# Patient Record
Sex: Female | Born: 1940 | Race: Black or African American | Hispanic: No | Smoking: Never smoker
Health system: Southern US, Community
[De-identification: ages and names within clinical notes are randomized; demographics above are authoritative.]

## PROBLEM LIST (undated history)

## (undated) DIAGNOSIS — E785 Hyperlipidemia, unspecified: Secondary | ICD-10-CM

## (undated) DIAGNOSIS — G43909 Migraine, unspecified, not intractable, without status migrainosus: Secondary | ICD-10-CM

## (undated) DIAGNOSIS — K219 Gastro-esophageal reflux disease without esophagitis: Secondary | ICD-10-CM

## (undated) DIAGNOSIS — J449 Chronic obstructive pulmonary disease, unspecified: Secondary | ICD-10-CM

## (undated) DIAGNOSIS — R4182 Altered mental status, unspecified: Secondary | ICD-10-CM

## (undated) DIAGNOSIS — M1990 Unspecified osteoarthritis, unspecified site: Secondary | ICD-10-CM

## (undated) DIAGNOSIS — J189 Pneumonia, unspecified organism: Secondary | ICD-10-CM

## (undated) DIAGNOSIS — M545 Low back pain, unspecified: Secondary | ICD-10-CM

## (undated) DIAGNOSIS — J42 Unspecified chronic bronchitis: Secondary | ICD-10-CM

## (undated) DIAGNOSIS — R0602 Shortness of breath: Secondary | ICD-10-CM

## (undated) DIAGNOSIS — M109 Gout, unspecified: Secondary | ICD-10-CM

## (undated) DIAGNOSIS — J328 Other chronic sinusitis: Secondary | ICD-10-CM

## (undated) DIAGNOSIS — I10 Essential (primary) hypertension: Secondary | ICD-10-CM

## (undated) DIAGNOSIS — E119 Type 2 diabetes mellitus without complications: Secondary | ICD-10-CM

## (undated) DIAGNOSIS — G8929 Other chronic pain: Secondary | ICD-10-CM

## (undated) DIAGNOSIS — Z9289 Personal history of other medical treatment: Secondary | ICD-10-CM

## (undated) DIAGNOSIS — K759 Inflammatory liver disease, unspecified: Secondary | ICD-10-CM

## (undated) HISTORY — DX: Unspecified osteoarthritis, unspecified site: M19.90

## (undated) HISTORY — PX: BREAST BIOPSY: SHX20

## (undated) HISTORY — DX: Gout, unspecified: M10.9

## (undated) HISTORY — DX: Other chronic sinusitis: J32.8

## (undated) HISTORY — PX: ABDOMINAL HYSTERECTOMY: SHX81

## (undated) HISTORY — PX: TONSILLECTOMY: SUR1361

## (undated) HISTORY — PX: TUBAL LIGATION: SHX77

## (undated) HISTORY — DX: Hyperlipidemia, unspecified: E78.5

## (undated) HISTORY — DX: Inflammatory liver disease, unspecified: K75.9

## (undated) HISTORY — PX: APPENDECTOMY: SHX54

## (undated) HISTORY — PX: SHOULDER ARTHROSCOPY W/ ROTATOR CUFF REPAIR: SHX2400

## (undated) HISTORY — DX: Unspecified chronic bronchitis: J42

## (undated) SURGERY — CARPAL TUNNEL RELEASE
Anesthesia: General | Laterality: Right

---

## 1997-11-15 ENCOUNTER — Ambulatory Visit (HOSPITAL_COMMUNITY): Admission: RE | Admit: 1997-11-15 | Discharge: 1997-11-15 | Payer: Self-pay | Admitting: Internal Medicine

## 1998-02-23 ENCOUNTER — Ambulatory Visit: Admission: RE | Admit: 1998-02-23 | Discharge: 1998-02-23 | Payer: Self-pay | Admitting: Internal Medicine

## 1998-05-30 ENCOUNTER — Ambulatory Visit (HOSPITAL_COMMUNITY): Admission: RE | Admit: 1998-05-30 | Discharge: 1998-05-30 | Payer: Self-pay | Admitting: Internal Medicine

## 1998-06-01 ENCOUNTER — Ambulatory Visit (HOSPITAL_COMMUNITY): Admission: RE | Admit: 1998-06-01 | Discharge: 1998-06-01 | Payer: Self-pay | Admitting: Obstetrics and Gynecology

## 1998-06-01 ENCOUNTER — Encounter: Payer: Self-pay | Admitting: Obstetrics and Gynecology

## 1998-06-23 ENCOUNTER — Other Ambulatory Visit: Admission: RE | Admit: 1998-06-23 | Discharge: 1998-06-23 | Payer: Self-pay | Admitting: Obstetrics and Gynecology

## 1998-07-29 ENCOUNTER — Ambulatory Visit (HOSPITAL_COMMUNITY): Admission: RE | Admit: 1998-07-29 | Discharge: 1998-07-29 | Payer: Self-pay | Admitting: *Deleted

## 1999-02-27 ENCOUNTER — Other Ambulatory Visit: Admission: RE | Admit: 1999-02-27 | Discharge: 1999-02-27 | Payer: Self-pay | Admitting: *Deleted

## 1999-02-27 ENCOUNTER — Encounter (INDEPENDENT_AMBULATORY_CARE_PROVIDER_SITE_OTHER): Payer: Self-pay

## 2000-06-14 ENCOUNTER — Ambulatory Visit (HOSPITAL_COMMUNITY): Admission: RE | Admit: 2000-06-14 | Discharge: 2000-06-14 | Payer: Self-pay | Admitting: General Surgery

## 2000-06-14 ENCOUNTER — Encounter (HOSPITAL_BASED_OUTPATIENT_CLINIC_OR_DEPARTMENT_OTHER): Payer: Self-pay | Admitting: General Surgery

## 2000-08-21 ENCOUNTER — Encounter: Admission: RE | Admit: 2000-08-21 | Discharge: 2000-08-21 | Payer: Self-pay | Admitting: Internal Medicine

## 2000-08-21 ENCOUNTER — Encounter: Payer: Self-pay | Admitting: Internal Medicine

## 2000-09-23 ENCOUNTER — Encounter: Admission: RE | Admit: 2000-09-23 | Discharge: 2000-09-23 | Payer: Self-pay | Admitting: Infectious Diseases

## 2000-10-30 ENCOUNTER — Encounter: Admission: RE | Admit: 2000-10-30 | Discharge: 2000-10-30 | Payer: Self-pay | Admitting: Infectious Diseases

## 2000-12-25 ENCOUNTER — Encounter: Admission: RE | Admit: 2000-12-25 | Discharge: 2000-12-25 | Payer: Self-pay | Admitting: Infectious Diseases

## 2001-01-07 ENCOUNTER — Ambulatory Visit (HOSPITAL_COMMUNITY): Admission: RE | Admit: 2001-01-07 | Discharge: 2001-01-07 | Payer: Self-pay | Admitting: Obstetrics

## 2001-01-07 ENCOUNTER — Encounter: Payer: Self-pay | Admitting: Obstetrics

## 2001-07-21 ENCOUNTER — Ambulatory Visit (HOSPITAL_COMMUNITY): Admission: RE | Admit: 2001-07-21 | Discharge: 2001-07-21 | Payer: Self-pay | Admitting: Internal Medicine

## 2001-07-21 ENCOUNTER — Encounter: Payer: Self-pay | Admitting: Internal Medicine

## 2002-01-12 ENCOUNTER — Ambulatory Visit (HOSPITAL_COMMUNITY): Admission: RE | Admit: 2002-01-12 | Discharge: 2002-01-12 | Payer: Self-pay | Admitting: Obstetrics

## 2002-01-12 ENCOUNTER — Encounter: Payer: Self-pay | Admitting: Obstetrics

## 2002-03-25 ENCOUNTER — Inpatient Hospital Stay (HOSPITAL_COMMUNITY): Admission: AD | Admit: 2002-03-25 | Discharge: 2002-03-30 | Payer: Self-pay | Admitting: Internal Medicine

## 2002-03-25 ENCOUNTER — Encounter: Payer: Self-pay | Admitting: Internal Medicine

## 2002-06-11 ENCOUNTER — Ambulatory Visit (HOSPITAL_COMMUNITY): Admission: RE | Admit: 2002-06-11 | Discharge: 2002-06-11 | Payer: Self-pay | Admitting: Orthopedic Surgery

## 2002-12-23 ENCOUNTER — Encounter: Payer: Self-pay | Admitting: Otolaryngology

## 2002-12-23 ENCOUNTER — Encounter: Admission: RE | Admit: 2002-12-23 | Discharge: 2002-12-23 | Payer: Self-pay | Admitting: Otolaryngology

## 2003-01-25 ENCOUNTER — Encounter: Payer: Self-pay | Admitting: Internal Medicine

## 2003-01-25 ENCOUNTER — Ambulatory Visit (HOSPITAL_COMMUNITY): Admission: RE | Admit: 2003-01-25 | Discharge: 2003-01-25 | Payer: Self-pay | Admitting: Internal Medicine

## 2003-07-19 ENCOUNTER — Ambulatory Visit (HOSPITAL_COMMUNITY): Admission: RE | Admit: 2003-07-19 | Discharge: 2003-07-19 | Payer: Self-pay | Admitting: Gastroenterology

## 2004-01-27 ENCOUNTER — Ambulatory Visit (HOSPITAL_COMMUNITY): Admission: RE | Admit: 2004-01-27 | Discharge: 2004-01-27 | Payer: Self-pay | Admitting: Pulmonary Disease

## 2004-05-02 ENCOUNTER — Ambulatory Visit: Payer: Self-pay | Admitting: Internal Medicine

## 2004-06-14 ENCOUNTER — Ambulatory Visit: Payer: Self-pay | Admitting: Internal Medicine

## 2004-06-18 ENCOUNTER — Emergency Department (HOSPITAL_COMMUNITY): Admission: EM | Admit: 2004-06-18 | Discharge: 2004-06-18 | Payer: Self-pay | Admitting: *Deleted

## 2004-06-20 ENCOUNTER — Ambulatory Visit: Payer: Self-pay | Admitting: Infectious Diseases

## 2004-06-20 ENCOUNTER — Encounter (INDEPENDENT_AMBULATORY_CARE_PROVIDER_SITE_OTHER): Payer: Self-pay | Admitting: Specialist

## 2004-06-20 ENCOUNTER — Inpatient Hospital Stay (HOSPITAL_COMMUNITY): Admission: AD | Admit: 2004-06-20 | Discharge: 2004-06-28 | Payer: Self-pay | Admitting: Ophthalmology

## 2004-06-30 ENCOUNTER — Observation Stay (HOSPITAL_COMMUNITY): Admission: EM | Admit: 2004-06-30 | Discharge: 2004-06-30 | Payer: Self-pay | Admitting: Emergency Medicine

## 2004-07-17 ENCOUNTER — Ambulatory Visit (HOSPITAL_COMMUNITY): Admission: RE | Admit: 2004-07-17 | Discharge: 2004-07-17 | Payer: Self-pay | Admitting: Internal Medicine

## 2004-08-31 ENCOUNTER — Ambulatory Visit: Payer: Self-pay | Admitting: Internal Medicine

## 2004-09-09 ENCOUNTER — Ambulatory Visit (HOSPITAL_COMMUNITY): Admission: RE | Admit: 2004-09-09 | Discharge: 2004-09-09 | Payer: Self-pay | Admitting: Otolaryngology

## 2004-09-29 ENCOUNTER — Ambulatory Visit (HOSPITAL_COMMUNITY): Admission: RE | Admit: 2004-09-29 | Discharge: 2004-09-29 | Payer: Self-pay | Admitting: Orthopedic Surgery

## 2004-11-03 ENCOUNTER — Ambulatory Visit: Payer: Self-pay | Admitting: Internal Medicine

## 2004-12-05 ENCOUNTER — Ambulatory Visit: Payer: Self-pay | Admitting: Internal Medicine

## 2005-01-05 ENCOUNTER — Encounter: Admission: RE | Admit: 2005-01-05 | Discharge: 2005-01-05 | Payer: Self-pay | Admitting: Internal Medicine

## 2005-02-05 ENCOUNTER — Ambulatory Visit: Payer: Self-pay | Admitting: Internal Medicine

## 2005-02-27 ENCOUNTER — Ambulatory Visit: Payer: Self-pay | Admitting: Internal Medicine

## 2005-03-14 ENCOUNTER — Ambulatory Visit (HOSPITAL_COMMUNITY): Admission: RE | Admit: 2005-03-14 | Discharge: 2005-03-14 | Payer: Self-pay | Admitting: Internal Medicine

## 2005-05-14 ENCOUNTER — Ambulatory Visit: Payer: Self-pay | Admitting: Internal Medicine

## 2005-06-25 ENCOUNTER — Ambulatory Visit: Payer: Self-pay | Admitting: Internal Medicine

## 2005-07-04 ENCOUNTER — Encounter: Admission: RE | Admit: 2005-07-04 | Discharge: 2005-07-04 | Payer: Self-pay | Admitting: Internal Medicine

## 2005-07-13 ENCOUNTER — Encounter: Admission: RE | Admit: 2005-07-13 | Discharge: 2005-07-13 | Payer: Self-pay | Admitting: Internal Medicine

## 2005-09-03 ENCOUNTER — Ambulatory Visit: Payer: Self-pay | Admitting: Internal Medicine

## 2006-01-01 ENCOUNTER — Ambulatory Visit: Payer: Self-pay | Admitting: Internal Medicine

## 2006-02-19 ENCOUNTER — Encounter: Admission: RE | Admit: 2006-02-19 | Discharge: 2006-02-19 | Payer: Self-pay | Admitting: Orthopedic Surgery

## 2006-03-29 ENCOUNTER — Ambulatory Visit (HOSPITAL_COMMUNITY): Admission: RE | Admit: 2006-03-29 | Discharge: 2006-03-29 | Payer: Self-pay | Admitting: Internal Medicine

## 2006-03-29 ENCOUNTER — Encounter: Payer: Self-pay | Admitting: Vascular Surgery

## 2006-04-02 ENCOUNTER — Ambulatory Visit (HOSPITAL_COMMUNITY): Admission: RE | Admit: 2006-04-02 | Discharge: 2006-04-02 | Payer: Self-pay | Admitting: Internal Medicine

## 2006-04-29 ENCOUNTER — Ambulatory Visit (HOSPITAL_COMMUNITY): Admission: RE | Admit: 2006-04-29 | Discharge: 2006-04-29 | Payer: Self-pay | Admitting: Internal Medicine

## 2006-04-30 ENCOUNTER — Ambulatory Visit: Payer: Self-pay | Admitting: Internal Medicine

## 2006-08-01 ENCOUNTER — Ambulatory Visit (HOSPITAL_COMMUNITY): Admission: RE | Admit: 2006-08-01 | Discharge: 2006-08-01 | Payer: Self-pay | Admitting: Orthopedic Surgery

## 2006-08-09 ENCOUNTER — Encounter: Admission: RE | Admit: 2006-08-09 | Discharge: 2006-08-09 | Payer: Self-pay | Admitting: Orthopedic Surgery

## 2006-08-29 ENCOUNTER — Ambulatory Visit: Payer: Self-pay | Admitting: Internal Medicine

## 2006-10-18 ENCOUNTER — Emergency Department (HOSPITAL_COMMUNITY): Admission: EM | Admit: 2006-10-18 | Discharge: 2006-10-18 | Payer: Self-pay | Admitting: Family Medicine

## 2006-10-19 ENCOUNTER — Emergency Department (HOSPITAL_COMMUNITY): Admission: EM | Admit: 2006-10-19 | Discharge: 2006-10-19 | Payer: Self-pay | Admitting: Family Medicine

## 2007-03-06 ENCOUNTER — Ambulatory Visit: Payer: Self-pay | Admitting: Internal Medicine

## 2007-03-07 ENCOUNTER — Encounter: Payer: Self-pay | Admitting: Internal Medicine

## 2007-03-07 DIAGNOSIS — M1990 Unspecified osteoarthritis, unspecified site: Secondary | ICD-10-CM | POA: Insufficient documentation

## 2007-03-07 DIAGNOSIS — J328 Other chronic sinusitis: Secondary | ICD-10-CM

## 2007-03-07 DIAGNOSIS — K759 Inflammatory liver disease, unspecified: Secondary | ICD-10-CM | POA: Insufficient documentation

## 2007-03-07 DIAGNOSIS — J209 Acute bronchitis, unspecified: Secondary | ICD-10-CM | POA: Insufficient documentation

## 2007-03-07 DIAGNOSIS — I10 Essential (primary) hypertension: Secondary | ICD-10-CM

## 2007-03-07 DIAGNOSIS — J440 Chronic obstructive pulmonary disease with acute lower respiratory infection: Secondary | ICD-10-CM

## 2007-03-07 DIAGNOSIS — M109 Gout, unspecified: Secondary | ICD-10-CM

## 2007-03-07 DIAGNOSIS — L02619 Cutaneous abscess of unspecified foot: Secondary | ICD-10-CM | POA: Insufficient documentation

## 2007-03-07 DIAGNOSIS — L03039 Cellulitis of unspecified toe: Secondary | ICD-10-CM

## 2007-04-04 ENCOUNTER — Ambulatory Visit (HOSPITAL_COMMUNITY): Admission: RE | Admit: 2007-04-04 | Discharge: 2007-04-04 | Payer: Self-pay | Admitting: Internal Medicine

## 2007-09-05 ENCOUNTER — Encounter: Payer: Self-pay | Admitting: Internal Medicine

## 2008-01-30 ENCOUNTER — Ambulatory Visit: Payer: Self-pay | Admitting: Internal Medicine

## 2008-04-22 ENCOUNTER — Ambulatory Visit (HOSPITAL_COMMUNITY): Admission: RE | Admit: 2008-04-22 | Discharge: 2008-04-22 | Payer: Self-pay | Admitting: Internal Medicine

## 2008-04-28 ENCOUNTER — Encounter: Admission: RE | Admit: 2008-04-28 | Discharge: 2008-04-28 | Payer: Self-pay | Admitting: Internal Medicine

## 2008-07-30 ENCOUNTER — Ambulatory Visit: Payer: Self-pay | Admitting: Internal Medicine

## 2008-09-29 ENCOUNTER — Encounter: Admission: RE | Admit: 2008-09-29 | Discharge: 2008-09-29 | Payer: Self-pay | Admitting: Orthopedic Surgery

## 2008-10-05 ENCOUNTER — Encounter: Admission: RE | Admit: 2008-10-05 | Discharge: 2008-10-05 | Payer: Self-pay | Admitting: Internal Medicine

## 2008-12-07 ENCOUNTER — Encounter: Admission: RE | Admit: 2008-12-07 | Discharge: 2008-12-07 | Payer: Self-pay | Admitting: Orthopedic Surgery

## 2008-12-09 ENCOUNTER — Ambulatory Visit (HOSPITAL_COMMUNITY): Admission: RE | Admit: 2008-12-09 | Discharge: 2008-12-11 | Payer: Self-pay | Admitting: Orthopedic Surgery

## 2009-01-13 ENCOUNTER — Encounter: Admission: RE | Admit: 2009-01-13 | Discharge: 2009-04-13 | Payer: Self-pay | Admitting: Orthopedic Surgery

## 2009-01-31 ENCOUNTER — Ambulatory Visit: Payer: Self-pay | Admitting: Internal Medicine

## 2009-02-09 ENCOUNTER — Telehealth: Payer: Self-pay | Admitting: Internal Medicine

## 2009-04-18 ENCOUNTER — Encounter: Admission: RE | Admit: 2009-04-18 | Discharge: 2009-06-13 | Payer: Self-pay | Admitting: Orthopedic Surgery

## 2009-04-29 ENCOUNTER — Ambulatory Visit (HOSPITAL_COMMUNITY): Admission: RE | Admit: 2009-04-29 | Discharge: 2009-04-29 | Payer: Self-pay | Admitting: Internal Medicine

## 2009-08-01 ENCOUNTER — Ambulatory Visit: Payer: Self-pay | Admitting: Internal Medicine

## 2009-09-20 ENCOUNTER — Telehealth: Payer: Self-pay | Admitting: Internal Medicine

## 2010-01-30 ENCOUNTER — Ambulatory Visit: Payer: Self-pay | Admitting: Internal Medicine

## 2010-05-07 ENCOUNTER — Encounter: Payer: Self-pay | Admitting: Internal Medicine

## 2010-05-11 ENCOUNTER — Other Ambulatory Visit (HOSPITAL_COMMUNITY): Payer: Self-pay | Admitting: Internal Medicine

## 2010-05-11 DIAGNOSIS — Z139 Encounter for screening, unspecified: Secondary | ICD-10-CM

## 2010-05-18 NOTE — Assessment & Plan Note (Signed)
Summary: rov 6 months///kp   Primary Provider/Referring Provider:  Dr. Renae Gloss  CC:  Follow up visit.  History of Present Illness: 70yo woman with  Chronic bronchitis/bronchiectasis, and a history of acute sinusitis.   01/30/08- Had Flu vax. Had pneumovax in 2003, 2005.Now retired from OR scrub tech role- she used to blame col air there for making her sick. Not as bad since retiring, some chest tightness now. Does cough, usually clear mucus. Denies chest pain or palpitation, fever, blood, edema, adenpopathy, headache. Pneumovax x 2, 2003, 2005. Had flu vax. Asks for third Pvax- discussed.  08/03/2008 Chronic bronchitis/ bronchiectasis, hx acute sinusitis Had good winter wihout significant viral illness. Did get flu shots. 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 Less frequently ill since she retired. she had not been coughing but in the last 2 days has developed some chest congestion, nonproductive cough, without fever , nodes, blood or sinus pain. She has mucinex to use as needed. 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  January 31, 2009- Chronic bronchitis/ bronchiectasis, hx sinusitis Had uncomplicated shoulder surgery.Nasal congestion with cooler Fall weather. Allegra-D is sufficient. Does her nebs 2-3 x daily. Mostly nasal drainage grey.Denies headache, earache or fever. Flu vax talk. 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  August 01, 2009- Chronic bronchitis/ bronchiectasis, hx sinusitis Two weeks ago she had sustained cough, bronchitis. Today she says she feels well. Not much trouble through the winter- some hoarseness and chest congestion. Infrequent cough now. Denies sinus pain/ congestion. Rarely needs rescue inhaler.     Current Medications (verified): 1)  Verapamil Hcl Cr 240 Mg Xr24h-Cap (Verapamil Hcl) .... Take 1  By Mouth Once Daily 2)  Triamterene-Hctz 37.5-25 Mg Tabs (Triamterene-Hctz) .... Take 1 By Mouth Once Daily 3)  Allegra-D 12 Hour 60-120 Mg Xr12h-Tab (Fexofenadine-Pseudoephedrine) .... Take 1 Tablet By Mouth Two Times A Day 4)  Albuterol Sulfate (2.5 Mg/57ml) 0.083% Nebu (Albuterol Sulfate) .... Use As Directed 5)  Etodolac 400 Mg Tabs (Etodolac) .... Take 1 By Mouth Once Daily 6)  Simvastatin 20 Mg Tabs (Simvastatin) .... Take 1 By Mouth Once Daily 7)  Potassium Chloride 20 Meq Pack (Potassium Chloride) .... Take 1 By Mouth Once Daily 8)  Pulmicort 0.25 Mg/73ml Susp (Budesonide) 9)  Multivitamins  Tabs (Multiple Vitamin) .... Take 1 By Mouth Once Daily  Allergies (verified): 1)  ! Pcn 2)  ! Levaquin 3)  ! 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) BRONCHITIS, CHRONIC (ICD-491.9)  Past Surgical History: Last updated: 01/31/2009 Hysterectomy Tubal ligation Tonsillectomy left shoulder- 2010  Family History: Last updated: 08-03-2008 Both parents deceased from MI Sibling 1 has DM and heart disease sibling 2 had Hilda Blades Disease/ ALS, hypertension siblings 3,4,5 healthy  Social History: Last updated: 02/17/2008 Never smoker Married No children positive for second-hand smoke exposure states exercises 4-5 times a week drinks 1-2 cups caffeine a day  Review of Systems      See HPI  The patient denies anorexia, fever, weight loss, weight gain, vision loss, decreased hearing, hoarseness, chest pain, syncope, dyspnea on exertion, peripheral edema, prolonged cough, headaches, hemoptysis, abdominal pain, and severe indigestion/heartburn.    Vital Signs:  Patient profile:   70 year old female Height:  62.5 inches Weight:      143.38 pounds BMI:     25.90 O2 Sat:      98 % on Room air Pulse rate:   69 /  minute BP sitting:   140 / 82  (left arm) Cuff size:   regular  Vitals Entered By: Reynaldo Minium CMA (August 01, 2009 11:24 AM)  O2 Flow:  Room air  Physical Exam  Additional Exam:  General: A/Ox3; pleasant and cooperative, NAD, SKIN: no rash, lesions NODES: no lymphadenopathy HEENT: [place]/AT, EOM- WNL, Conjuctivae- clear, PERRLA, TM-WNL, Nose- clear, Throat- clear and wnl, Mallampati II, no drainage NECK: Supple w/ fair ROM, JVD- none, normal carotid impulses w/o bruits Thyroid-  CHEST:Clear to P&A, unlabored and talkative,  HEART: RRR NWG:NFAO, nl pulses, no edema  NEURO: Grossly intact to observation      Impression & Recommendations:  Problem # 1:  BRONCHITIS, CHRONIC (ICD-491.9)  Doing very well now. It sounds as if she may have had a cold 1-2 weeks ago, but it's a little hard to be sure. She will remain somewhat labile and we want to make sure she has meds available to her. 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 We discussed management should she have a flare this summer.  Other Orders: Est. Patient Level III (13086)  Patient Instructions: 1)  Please schedule a follow-up appointment in 6 months. 2)  Call if you need help.

## 2010-05-18 NOTE — Progress Notes (Signed)
Summary: waiting on refill  Phone Note Call from Patient Call back at Home Phone [phone]   Caller: Patient Call For: young Summary of Call: pt waiting for [facility]ine[facility] [address] (pt is out of this).  Initial call taken by: Tivis Ringer, CNA,  September 20, 2009 3:20 PM  Follow-up for Phone Call        refill sent. pt aware.Carron Curie CMA  September 20, 2009 3:23 PM     Prescriptions: ALLEGRA-D 12 HOUR 60-120 MG XR12H-TAB (FEXOFENADINE-PSEUDOEPHEDRINE) Take 1 tablet by mouth two times a day  #60 Tablet x 5   Entered by:   Carron Curie CMA   Authorized by:   Waymon Budge MD   Signed by:   Carron Curie CMA on 09/20/2009   Method used:   [facility] to        [facility]  [address] [phone]* (retail)       [address]       [place], [place]  [id number]       Ph: [phone] or [phone]       Fax: [phone]   RxID:   [id number]

## 2010-05-18 NOTE — Assessment & Plan Note (Signed)
Summary: 6 months/apc   Primary Provider/Referring Provider:  Dr. Renae Gloss  CC:  6 month follow  up  visit-SOB at times(weather changes).  History of Present Illness:  January 31, 2009- Chronic bronchitis/ bronchiectasis, hx sinusitis Had uncomplicated shoulder surgery.Nasal congestion with cooler Fall weather. Allegra-D is sufficient. Does her nebs 2-3 x daily. Mostly nasal drainage grey.Denies headache, earache or fever. Flu vax talk. 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  August 01, 2009- Chronic bronchitis/ bronchiectasis, hx sinusitis Two weeks ago she had sustained cough, bronchitis. Today she says she feels well. Not much trouble through the winter- some hoarseness and chest congestion. Infrequent cough now. Denies sinus pain/ congestion. Rarely needs rescue inhaler.   January 30, 2010- Chronic bronchitis/ bronchiectasis, hx sinusitis Nurse CC  6 month follow  up  visit-SOB at times(weather changes) Has done well through summer and fall up to now. Most days will have some cough. Associates weather change with a little SOB. Unusual to have breathing wake her otherwise,  and her nebulizer is suficient if needed.  She shows me two old rescue inhalers, still almost full, from 2006. Her main c/o is low back pain, sometimes aggravated if she coughs hard. Not much sputum- mostly in the morning.  Uses neb two times a day daily. Exercises and steam room at [facility]  Preventive Screening-Counseling & Management  Alcohol-Tobacco     Smoking Status: never  Current Medications (verified): 1)  Verapamil Hcl Cr 240 Mg Xr24h-Cap (Verapamil Hcl) .... Take 1 By Mouth Once Daily 2)  Triamterene-Hctz 37.5-25 Mg Tabs (Triamterene-Hctz) .... Take 1 By Mouth Once Daily 3)  Allegra-D 12 Hour 60-120 Mg Xr12h-Tab (Fexofenadine-Pseudoephedrine) .... Take 1 Tablet By Mouth Two Times A Day 4)  Albuterol Sulfate (2.5 Mg/46ml) 0.083% Nebu (Albuterol  Sulfate) .... Use As Directed 5)  Etodolac 400 Mg Tabs (Etodolac) .... Take 1 By Mouth Once Daily 6)  Simvastatin 20 Mg Tabs (Simvastatin) .... Take 1 By Mouth Once Daily 7)  Potassium Chloride 20 Meq Pack (Potassium Chloride) .... Take 1 By Mouth Once Daily 8)  Pulmicort 0.25 Mg/47ml Susp (Budesonide) 9)  Multivitamins  Tabs (Multiple Vitamin) .... Take 1 By Mouth Once Daily 10)  Tylenol Pm Extra Strength 500-25 Mg Tabs (Diphenhydramine-Apap (Sleep)) .... Take As Directed Per Bottle As Needed 11)  Hydrocodone-Acetaminophen 5-500 Mg Tabs (Hydrocodone-Acetaminophen) .... Take 1 By Mouth Every 4 Hours As Needed Not To Exceed 3 Daily  Allergies (verified): 1)  ! Pcn 2)  ! Levaquin 3)  ! 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) BRONCHITIS, CHRONIC (ICD-491.9)  Past Surgical History: Last updated: 01/31/2009 Hysterectomy Tubal ligation Tonsillectomy left shoulder- 2010  Family History: Last updated: 08/01/2008 Both parents deceased from MI Sibling 1 has DM and heart disease sibling 2 had Hilda Blades Disease/ ALS, hypertension siblings 3,4,5 healthy  Social History: Last updated: 02/17/2008 Never smoker Married No children positive for second-hand smoke exposure states exercises 4-5 times a week drinks 1-2 cups caffeine a day  Risk Factors: Smoking Status: never (01/30/2010)  Social History: Smoking Status:  never  Review of Systems      See HPI       The patient complains of shortness of breath with activity, productive cough, and non-productive cough.  The patient denies shortness of breath at rest, coughing up blood, chest pain, irregular heartbeats, acid  heartburn, indigestion, loss of appetite, weight change, abdominal pain, difficulty swallowing, sore throat, tooth/dental problems, headaches, nasal  congestion/difficulty breathing through nose, and sneezing.    Vital Signs:  Patient profile:   70 year old female Height:      62.5 inches Weight:      141.13 pounds BMI:     25.49 O2 Sat:      100 % on Room air Pulse rate:   81 / minute BP sitting:   124 / 68  (left arm) Cuff size:   regular  Vitals Entered By: Reynaldo Minium CMA (January 30, 2010 1:42 PM)  O2 Flow:  Room air CC: 6 month follow  up  visit-SOB at times(weather changes)   Physical Exam  Additional Exam:  General: A/Ox3; pleasant and cooperative, NAD, SKIN: no rash, lesions NODES: small node under left mandible- had broken a tooth. HEENT: [place]/AT, EOM- WNL, Conjuctivae- clear, PERRLA, TM-WNL, Nose- clear, Throat- clear and wnl, Mallampati II, no drainage NECK: Supple w/ fair ROM, JVD- none, normal carotid impulses w/o bruits Thyroid-  CHEST:Clear to P&A, unlabored and talkative, Coarse large airway rattle.  HEART: RRR TFT:DDUK, nl pulses, no edema  NEURO: Grossly intact to observation      Impression & Recommendations:  Problem # 1:  BRONCHITIS, CHRONIC (ICD-491.9)  Still significant chronic proximal bronchits. 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 We will refill her neb meds and a rescue inhaler. Has had flu vax. Is not using pulmicort- too expensive.  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 fortunately this has not recurred. We have again considered the basis for improvement since she retired and left the "clean" environment of the operating rooms at [employer].  She is no longer chilled as she frequently was at work- that may be significant.   Medications Added to Medication List This Visit: 1)  Tylenol Pm Extra Strength 500-25 Mg Tabs (Diphenhydramine-apap (sleep)) .... Take as  directed per bottle as needed 2)  Hydrocodone-acetaminophen 5-500 Mg Tabs (Hydrocodone-acetaminophen) .... Take 1 by mouth every 4 hours as needed not to exceed 3 daily 3)  Proair Hfa 108 (90 Base) Mcg/act Aers (Albuterol sulfate) .... 2 puffs four times a day as needed - albuterol rescue inhaler  Complete Medication List: 1)  Verapamil Hcl Cr 240 Mg Xr24h-cap (Verapamil hcl) .... Take 1 by mouth once daily 2)  Triamterene-hctz 37.5-25 Mg Tabs (Triamterene-hctz) .... Take 1 by mouth once daily 3)  Allegra-d 12 Hour 60-120 Mg Xr12h-tab (Fexofenadine-pseudoephedrine) .... Take 1 tablet by mouth two times a day 4)  Albuterol Sulfate (2.5 Mg/41ml) 0.083% Nebu (Albuterol sulfate) .... Use as directed 5)  Etodolac 400 Mg Tabs (Etodolac) .... Take 1 by mouth once daily 6)  Simvastatin 20 Mg Tabs (Simvastatin) .... Take 1 by mouth once daily 7)  Potassium Chloride 20 Meq Pack (Potassium chloride) .... Take 1 by mouth once daily 8)  Multivitamins Tabs (Multiple vitamin) .... Take 1 by mouth once daily 9)  Tylenol Pm Extra Strength 500-25 Mg Tabs (Diphenhydramine-apap (sleep)) .... Take as directed per bottle as needed 10)  Hydrocodone-acetaminophen 5-500 Mg Tabs (Hydrocodone-acetaminophen) .... Take 1 by mouth every 4 hours as needed not to exceed 3 daily 11)  Proair Hfa 108 (90 Base) Mcg/act Aers (Albuterol sulfate) .... 2 puffs four times a day as  needed - albuterol rescue inhaler  Other Orders: Est. Patient Level III (16109)  Patient Instructions: 1)  Please schedule a follow-up appointment in 6 months. 2)  Try using mucinex for a few days along with the Flutter to clear your airways out once in a while.  3)  Meds refilled, including a modern replacement for your old albuterol inhalers.  Prescriptions: PROAIR HFA 108 (90 BASE) MCG/ACT AERS (ALBUTEROL SULFATE) 2 puffs four times a day as needed - albuterol rescue inhaler  #1 x prn   Entered and Authorized by:   Waymon Budge MD   Signed by:    Waymon Budge MD on 01/30/2010   Method used:   Print then Give to Patient   RxID:   [id number] ALBUTEROL SULFATE (2.5 MG/3ML) 0.083% NEBU (ALBUTEROL SULFATE) Use as directed  #175 x 3   Entered and Authorized by:   Waymon Budge MD   Signed by:   Waymon Budge MD on 01/30/2010   Method used:   Print then Give to Patient   RxID:   [id number]

## 2010-05-22 ENCOUNTER — Ambulatory Visit (HOSPITAL_COMMUNITY)
Admission: RE | Admit: 2010-05-22 | Discharge: 2010-05-22 | Disposition: A | Discharge status: Home / Self Care | Payer: Medicare Other | Source: Ambulatory Visit | Attending: Internal Medicine | Admitting: Internal Medicine

## 2010-05-22 DIAGNOSIS — Z1231 Encounter for screening mammogram for malignant neoplasm of breast: Secondary | ICD-10-CM | POA: Insufficient documentation

## 2010-05-22 DIAGNOSIS — Z139 Encounter for screening, unspecified: Secondary | ICD-10-CM

## 2010-05-24 ENCOUNTER — Other Ambulatory Visit: Payer: Self-pay | Admitting: Internal Medicine

## 2010-05-24 DIAGNOSIS — R928 Other abnormal and inconclusive findings on diagnostic imaging of breast: Secondary | ICD-10-CM

## 2010-05-30 ENCOUNTER — Ambulatory Visit
Admission: RE | Admit: 2010-05-30 | Discharge: 2010-05-30 | Disposition: A | Discharge status: Home / Self Care | Payer: Medicare Other | Source: Ambulatory Visit | Attending: Internal Medicine | Admitting: Internal Medicine

## 2010-05-30 DIAGNOSIS — R928 Other abnormal and inconclusive findings on diagnostic imaging of breast: Secondary | ICD-10-CM

## 2010-07-22 LAB — COMPREHENSIVE METABOLIC PANEL
ALT: 14 U/L (ref 0–35)
AST: 22 U/L (ref 0–37)
Alkaline Phosphatase: 149 U/L — ABNORMAL HIGH (ref 39–117)
CO2: 29 mEq/L (ref 19–32)
Calcium: 9.9 mg/dL (ref 8.4–10.5)
GFR calc Af Amer: 60 mL/min — ABNORMAL LOW (ref >60)
Glucose, Bld: 119 mg/dL — ABNORMAL HIGH (ref 70–99)
Potassium: 3.2 mEq/L — ABNORMAL LOW (ref 3.5–5.1)
Sodium: 144 mEq/L (ref 135–145)
Total Protein: 7.8 g/dL (ref 6.0–8.3)

## 2010-07-22 LAB — CBC
Hemoglobin: 12 g/dL (ref 12.0–15.0)
RBC: 3.67 MIL/uL — ABNORMAL LOW (ref 3.87–5.11)
RDW: 13.4 % (ref 11.5–15.5)

## 2010-07-22 LAB — URINE MICROSCOPIC-ADD ON

## 2010-07-22 LAB — URINALYSIS, ROUTINE W REFLEX MICROSCOPIC
Glucose, UA: NEGATIVE mg/dL
Hgb urine dipstick: NEGATIVE
Protein, ur: NEGATIVE mg/dL
pH: 6.5 (ref 5.0–8.0)

## 2010-07-22 LAB — PROTIME-INR: Prothrombin Time: 13.9 seconds (ref 11.6–15.2)

## 2010-07-28 ENCOUNTER — Encounter: Payer: Self-pay | Admitting: Internal Medicine

## 2010-07-31 ENCOUNTER — Encounter: Payer: Self-pay | Admitting: Internal Medicine

## 2010-07-31 ENCOUNTER — Ambulatory Visit (INDEPENDENT_AMBULATORY_CARE_PROVIDER_SITE_OTHER)
Admission: RE | Admit: 2010-07-31 | Discharge: 2010-07-31 | Disposition: A | Discharge status: Home / Self Care | Payer: Medicare Other | Source: Ambulatory Visit | Attending: Internal Medicine | Admitting: Internal Medicine

## 2010-07-31 ENCOUNTER — Ambulatory Visit (INDEPENDENT_AMBULATORY_CARE_PROVIDER_SITE_OTHER): Payer: Medicare Other | Admitting: Internal Medicine

## 2010-07-31 VITALS — BP 120/62 | HR 79 | Ht 62.5 in | Wt 139.4 lb

## 2010-07-31 DIAGNOSIS — J42 Unspecified chronic bronchitis: Secondary | ICD-10-CM

## 2010-07-31 DIAGNOSIS — J328 Other chronic sinusitis: Secondary | ICD-10-CM

## 2010-07-31 MED ORDER — ALBUTEROL SULFATE (2.5 MG/3ML) 0.083% IN NEBU: 2.5000 mg | INHALATION_SOLUTION | Freq: Four times a day (QID) | RESPIRATORY_TRACT | Status: DC | PRN

## 2010-07-31 MED ORDER — ROFLUMILAST 500 MCG PO TABS: 500.0000 ug | ORAL_TABLET | Freq: Every day | ORAL | Status: DC

## 2010-07-31 NOTE — Progress Notes (Signed)
  Subjective:    Patient ID: Sheila Cisneros, female    DOB: 25-Oct-1940, 70 y.o.   MRN: [id number]  HPI 70 yoF followed for chronic bronchitis/ bronchiectasis and hx rhinosinusitis. Never smoker. Last here- January 30, 2010. She remained congested throough the winter, but she put up with it. No events needing medical visits. Pollen now makes cough somewhat more constant. Rarely productive. Denies fever or night sweats. Nebulizer still helps, used about twice daily. Denies  Chest pain or headache.    Review of Systems  Constitutional: Negative for fever, chills, diaphoresis, activity change and fatigue.  HENT: Positive for congestion, rhinorrhea, sneezing and postnasal drip. Negative for nosebleeds and ear discharge.   Eyes: Positive for itching.  Respiratory: Negative for cough, choking, chest tightness, shortness of breath, wheezing and stridor.    See HPI     Objective:   Physical Exam .General- Alert, Oriented, Affect-appropriate, Distress- none acute  Skin- rash-none, lesions- none, excoriation- none  Lymphadenopathy- none  Head- atraumatic  Eyes- Gross vision intact, PERRLA, conjunctivae clear secretions  Ears- Hearing  Normal- canals, Tm L ,   R ,  Nose- Clear,  No-Septal dev, mucus, polyps, erosion, perforation   Throat- Mallampati II , mucosa clear , drainage- none, tonsils- atrophic  Neck- flexible , trachea midline, no stridor , thyroid nl, carotid no bruit  Chest - symmetrical excursion , unlabored     Heart/CV- RRR , no murmur , no gallop  , no rub, nl s1 s2                     - JVD- none , edema- none, stasis changes- none, varices- none     Lung-  Deep non productive cough/ rattle., dullness-none, rub- none     Chest wall-   Abd- tender-no, distended-no, bowel sounds-present, HSM- no  Br/ Gen/ Rectal- Not done, not indicated  Extrem- cyanosis- none, clubbing, none, atrophy- none, strength- nl  Neuro- grossly intact to observation          Assessment & Plan:

## 2010-07-31 NOTE — Assessment & Plan Note (Signed)
Chronic bronchitis/ bronchiectasis. We will update CXR/  We discused and will try Daliresp as an antiinflammatory.

## 2010-07-31 NOTE — Patient Instructions (Addendum)
Orders- CXR- dx Chronic bronchitis  Samples/ script  Daliresp  1 daily  After meal.

## 2010-08-05 ENCOUNTER — Encounter: Payer: Self-pay | Admitting: Internal Medicine

## 2010-08-05 NOTE — Assessment & Plan Note (Signed)
She has been hospitalized before with severe periorbital and facial swelling due to acute sinusitis, but has had no recurrence on that scale. We manage symptomatically.

## 2010-08-11 NOTE — Progress Notes (Signed)
Quick Note:  Left message for patient to call back. ______ 

## 2010-08-16 ENCOUNTER — Telehealth: Payer: Self-pay | Admitting: Internal Medicine

## 2010-08-16 NOTE — Telephone Encounter (Signed)
lmomtcb x1 

## 2010-08-17 NOTE — Telephone Encounter (Signed)
lmomtcb x 2  

## 2010-08-18 NOTE — Telephone Encounter (Signed)
LMTCB x 3- she needs results of cxr and CDY wants to order CT Chest so will need BMET if she agrees. Will wait and see if she calls back and if not will send letter.

## 2010-08-21 ENCOUNTER — Encounter: Payer: Self-pay | Admitting: *Deleted

## 2010-08-21 NOTE — Telephone Encounter (Signed)
Still unable to get ahold of pt.  Therefore will send pt a letter to call our office to discuss test results.

## 2010-08-25 ENCOUNTER — Other Ambulatory Visit: Payer: Self-pay | Admitting: Internal Medicine

## 2010-08-25 ENCOUNTER — Other Ambulatory Visit (INDEPENDENT_AMBULATORY_CARE_PROVIDER_SITE_OTHER): Payer: Medicare Other

## 2010-08-25 DIAGNOSIS — J479 Bronchiectasis, uncomplicated: Secondary | ICD-10-CM

## 2010-08-25 LAB — BASIC METABOLIC PANEL
Calcium: 9.4 mg/dL (ref 8.4–10.5)
Creatinine, Ser: 0.9 mg/dL (ref 0.4–1.2)
GFR: 78.76 mL/min (ref >60.00)
Sodium: 139 mEq/L (ref 135–145)

## 2010-08-28 ENCOUNTER — Ambulatory Visit (INDEPENDENT_AMBULATORY_CARE_PROVIDER_SITE_OTHER)
Admission: RE | Admit: 2010-08-28 | Discharge: 2010-08-28 | Disposition: A | Discharge status: Home / Self Care | Payer: Medicare Other | Source: Ambulatory Visit | Attending: Internal Medicine | Admitting: Internal Medicine

## 2010-08-28 DIAGNOSIS — J479 Bronchiectasis, uncomplicated: Secondary | ICD-10-CM

## 2010-08-28 HISTORY — DX: Essential (primary) hypertension: I10

## 2010-08-28 MED ORDER — IOHEXOL 300 MG/ML  SOLN
80.0000 mL | Freq: Once | INTRAMUSCULAR | Status: AC | PRN
  Administered 2010-08-28: 80 mL via INTRAVENOUS

## 2010-08-29 NOTE — Assessment & Plan Note (Signed)
[facility]                             PULMONARY OFFICE NOTE   FLETCHER, RATHBUN                        MRN:          [id number]  DATE:03/06/2007                            DOB:          [birth year]    PULMONARY FOLLOWUP   PROBLEM LIST:  1. Chronic bronchitis/bronchiectasis.  2. Chronic rhinosinusitis.  3. Hypertension.  4. History of hepatitis.  5. Gout.   HISTORY:  She feels better today than she often does.  We discussed her  experience with the workplace because she is hoping to retire this  winter.  She says that a closed in feeling being indoors and the pace  of work at the hospital are the problems more than any sort of air  quality issue.  She is better at home because she can pace herself and  sit down when she wants.  There has been no recent exacerbation.   MEDICATIONS:  1. Verapamil.  2. Mucinex.  3. Benicar.  4. Triamterine/hydrochlorothiazide.  5. Allegra D 60 mg.  6. Home nebulizer with DuoNeb q.i.d.  7. She has Pulmicort 0.5 mg b.i.d.  8. Nasonex.  9. Albuterol rescue inhaler.   Drug intolerant PENICILLIN with rash, LEVAQUIN with itching (can  tolerate Cipro).  CODEINE causing angioedema.  TROVAN.   OBJECTIVE:  Weight 152 pounds, BP 160/104, pulse regular 86, room air  saturation 100%.  There are a few scattered rhonchi.  HEART:  Sounds are regular.  Congested cough.  No evidence of sinus  drainage or periorbital edema.   IMPRESSION:  Chronic bronchitis/bronchiectasis, and a history of acute  sinusitis.   PLAN:  Schedule return in 6 months when she will have been out of work  for several months.     Clinton D. Maple Hudson, MD, Tonny Bollman, FACP  Electronically Signed    CDY/MedQ  DD: 03/06/2007  DT: 03/07/2007  Job #: [id number]   cc:   Merlene Laughter. Renae Gloss, M.D.

## 2010-08-29 NOTE — Assessment & Plan Note (Signed)
 [facility]ALTH[facility]                             PULMONARY OFFICE NOTE   Sheila Cisneros, Sheila Cisneros                        MRN:          [id number]  DATE:08/29/2006                            DOB:          [birth year]    PROBLEMS:  1. Chronic bronchitis/bronchiectasis.  2. Chronic rhinosinusitis.  3. Hypertension.  4. History of hepatitis.  5. Gout.   HISTORY:  She has been doing pretty well since this winter when she had  pneumonia. There is some shortness of breath noted if she hurries. She  makes a point of dressing warmly. Most days she has a little productive  cough with phlegm. 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 We reviewed history of positive allergy skin testing,  she declined vaccine. She quite Xolair injections.   MEDICATIONS:  1. Verapamil.  2. Mucinex.  3. Benicar.  4. Triamterene/hydrochlorothiazide.  5. Allegra b.i.d. p.r.n.  6. Home nebulizer with Pulmicort 0.5 mg b.i.d.   She is not using albuterol by nebulizer currently. She does have a  rescue albuterol inhaler, which she has not needed in a while now. Drug  intolerant PENICILLIN with rash, LEVAQUIN with itching (CIPRO okay),  TROVAN, CODEINE with angioedema.   OBJECTIVE:  Weight 153 pounds, blood pressure 132/80, pulse 86, room air  saturation 97. There is mild congested rattle in both lung bases. Work  of breathing is not increased. There is no periorbital edema or  significant nasal congestion. I do not find adenopathy or edema. Heart  sounds are regular.   IMPRESSION:  Chronic bronchitis with bronchiectasis. Chronic rhinitis  with an allergic component. She does not currently have an acute  bronchitis or sinusitis.   PLAN:  No changes. Schedule return 6 months, earlier p.r.n.     Clinton D. Maple Hudson, MD, FCCP, FACP     CDY/MedQ  DD: 08/29/2006  DT: 08/30/2006  Job #: [id number]   cc:   Merlene Laughter. Renae Gloss, M.D.

## 2010-08-29 NOTE — Op Note (Signed)
NAME:  Sheila Cisneros, Sheila Cisneros NO.:  [id number]   MEDICAL RECORD NO.:  [id number]          PATIENT TYPE:  INP   LOCATION:  5016                         FACILITY:  [facility]   PHYSICIAN:  Myrtie Neither, MD      DATE OF BIRTH:  25-Sep-1940   DATE OF PROCEDURE:  12/09/2008  DATE OF DISCHARGE:                               OPERATIVE REPORT   PREOPERATIVE DIAGNOSES:  1. Acute rotator cuff tear, left shoulder.  2. Impingement syndrome, left shoulder.   POSTOPERATIVE DIAGNOSES:  1. Acute rotator cuff tear, left shoulder.  2. Impingement syndrome, left shoulder.  3. Loose body and chronic synovitis, left shoulder.   ANESTHESIA:  General.   PROCEDURES:  1. Arthroscopic synovectomy and removal of the loose bodies.  2. Arthroscopic acromioplasty.  3. Mini open rotator cuff repair with 2 Biomet anchors.   The patient was taken to the operating room.  After given adequate preop  medications, given general anesthesia and was intubated.  The patient  was placed in barber-chair position.  Left shoulder was prepped with  DuraPrep and draped in a sterile manner.  A one-half inch puncture wound  was made posteriorly.  Swisher rod was placed posterior to anterior in  flow water.  Incision was then made.  Lateral separate puncture wound  was made for the arthroscope.  Inspection revealed large hemarthrosis,  which was evacuated initially.  Identification of the complete ruptured  tear rotator cuff with hemorrhagic bed from the greater tuberosity, a  large loose body was also found in the joint.  This was removed with the  use of arthroscopic shaver.  Complete synovectomy was done followed by  with the use of a bur acromioplasty.   Mini incision was made laterally going through the skin.  Deltoid muscle  was split.  Rotator cuff tendon substance was retrieved both anteriorly  and posteriorly.  A trough was made into the previous bed with the use  of osteotome and rasp.  Two Biomet anchors  were placed into the bed.  Eight sutures were placed into the rotator cuff substance, which was  then pulled back down into the bed.  Talmettorizing of the bone around  the bed was also done prior to placement of the anchors.  Sutures were  tied off and cut.  Further inspection did not reveal any other loose  bodies.  Separate osteophyte was found more anteriorly and was removed.  Wound closure was then done with 0 Vicryl for the deltoid and 2-0 Vicryl  for the subcutaneous and skin staples for the skin.  Compressive  dressings applied.  The patient was placed into a shoulder abduction  brace.  The patient tolerated the procedure quite well and transferred  to  recovery room in stable and satisfactory condition.  The patient is  being kept on 23-hour observation due to her COPD condition.  The  patient will be discharged home on Percocet 1-2 q.6 h. p.r.n., and to  return to the office in 1 week.  The patient will be discharged in  stable and satisfactory condition.  Myrtie Neither, MD  Electronically Signed     AC/MEDQ  D:  12/09/2008  T:  12/10/2008  Job:  [id number]

## 2010-09-01 NOTE — Op Note (Signed)
NAME:  Sheila Cisneros, PICKING                           ACCOUNT NO.:  [id number]   MEDICAL RECORD NO.:  [id number]                   PATIENT TYPE:  AMB   LOCATION:  ENDO                                 FACILITY:  [facility]   PHYSICIAN:  Anselmo Rod, M.D.               DATE OF BIRTH:  [birth year]   DATE OF PROCEDURE:  07/19/2003  DATE OF DISCHARGE:                                 OPERATIVE REPORT   PROCEDURE PERFORMED:  Screening colonoscopy.   ENDOSCOPIST:  Charna Elizabeth, M.D.   INSTRUMENT USED:  Olympus video colonoscope.   INDICATIONS FOR PROCEDURE:  The patient is a 70 year old African-American  female with family history of colon cancer in her father undergoing  screening colonoscopy to rule out colonic polyps, masses, etc.   PREPROCEDURE PREPARATION:  Informed consent was procured from the patient.  The patient was fasted for eight hours prior to the procedure and prepped  with a bottle of magnesium citrate and a gallon of GoLYTELY the night prior  to the procedure.   PREPROCEDURE PHYSICAL:  The patient had stable vital signs.  Neck supple.  Chest clear to auscultation.  S1 and S2 regular.  Abdomen soft with normal  bowel sounds.   DESCRIPTION OF PROCEDURE:  The patient was placed in left lateral decubitus  position and sedated with 100 mg of Demerol and 10 mg of Versed  intravenously.  Once the patient was adequately sedated and maintained on  low flow oxygen and continuous cardiac monitoring, the Olympus video  colonoscope was advanced from the rectum to the hepatic flexure.  There was  some difficulty in advancing the scope beyond the hepatic flexure and  therefore the adult scope was changed to the pediatric adjustable  colonoscope.  The endoscope was advanced to the cecum.  The patient's  position was changed from the left lateral to the supine and the right  lateral position with gentle application of abdominal pressure.  The  appendicular orifice and ileocecal valve were  clearly visualized and  photographed.  The terminal ileum appeared normal and without lesions.  Retroflexion in the rectum revealed no abnormalities.  There was no evidence  of masses, polyps or diverticulosis.  The patient tolerated the procedure  well without immediately complications.   IMPRESSION:  1. Essentially normal colonoscopy up to the terminal ileum.  2. Some difficulty advancing the scope beyond the hepatic flexure, adult     scope changed to pediatric colonoscope.   RECOMMENDATIONS:  1. Continue high fiber diet with liberal fluid intake.  2. Repeat colorectal cancer screening is recommended in the next five years     unless the patient develops any abnormal symptoms in the interim.  3. Outpatient followup as need arises in the future.  Anselmo Rod, M.D.    JNM/MEDQ  D:  07/19/2003  T:  07/19/2003  Job:  [id number]   cc:   Merlene Laughter. Renae Gloss, M.D.  [address]  [address]  [place]  [place] [address]  Fax: [phone]   Dr. Clyda Greener

## 2010-09-01 NOTE — Op Note (Signed)
NAME:  Sheila Cisneros, FOBES NO.:  [id number]   MEDICAL RECORD NO.:  [id number]          PATIENT TYPE:  INP   LOCATION:  5714                         FACILITY:  [facility]   PHYSICIAN:  Jefry H. Pollyann Kennedy, MD     DATE OF BIRTH:  Oct 02, 1940   DATE OF PROCEDURE:  06/20/2004  DATE OF DISCHARGE:                                 OPERATIVE REPORT   PREOPERATIVE DIAGNOSIS:  Sphenoid sinusitis with orbital cellulitis.   POSTOPERATIVE DIAGNOSIS:  Sphenoid sinusitis with orbital cellulitis.   PROCEDURE:  Left endoscopic sphenoidotomy.   SURGEON:  Jefry H. Pollyann Kennedy, M.D.   ANESTHESIA:  General endotracheal anesthesia was used.   COMPLICATIONS:  None.   BLOOD LOSS:  Minimal.   FINDINGS:  Completely obstructed left sphenoid sinus that when opened was  found to be filled with a fungal type of pasty debris.   HISTORY:  A 70 year old lady admitted to the hospital a couple of hours ago  with a 24-hour history of progressive right eye swelling and  ophthalmoplegia. She was found to have a completely opacified left sphenoid  and orbital and cavernous sinus thrombosis. Risks, benefits, alternatives,  complications of procedure were explained to the patient who seemed to  understand and agreed to surgery.   PROCEDURE:  The patient was taken to the operating room, placed the  operating table in supine position. Following induction of general  endotracheal anesthesia, the patient was draped in standard fashion.  Oxymetazoline spray was used preoperatively in nasal cavities. Afrin soaked  pledgets were placed in bilateral nasal cavities on multiple occasions for  decongestant effect. Xylocaine 1% with epinephrine was also injected into  the septum, the inferior turbinate, the middle turbinate and the  sphenoethmoidal recess area. Using combination of zero and 30 degree nasal  endoscopes, the left sinus cavities were inspected. The ethmoid complex was  opened and clear. The middle turbinate was  partially obstructing the  posterior nasal cavity up along the septum and was partially resected using  straight through cut forceps. The posterior nasal cavity adjacent to the  posterior nasal septum was inspected and was measured at 6 cm. A straight  suction was then passed through this dense and heavy bone into what was  apparently the sphenoid sinus. A measurement of the back wall of the  sphenoid sinus measured approximately 8.75 centimeters. A Kerrison rongeur  was used to enlarge the opening in a medial and inferior fashion until I was  able to visualize within the sinus well enough and then bite off some of the  wall superiorly and laterally. After the sinus was nice and opened, a large  amount of fungal type debris was identified and was suctioned out and sent  for pathologic evaluation and for culture. The sinus was then irrigated with  saline. Careful inspection with the 0 degree endoscope revealed the sinus  was all clear and that the mucosa was intact and the back wall was all  intact without dehiscence or any signs of invasion. Afrin soaked pledgets  were placed in posterior nasal cavity and were removed after  the patient  was awakened from anesthetic. Nasal and sinus cavities were suctioned of  blood and secretions. The patient was awakened, extubated and transferred to  recovery in stable condition. Pupillary responses were still normal  following the procedure.      JHR/MEDQ  D:  06/21/2004  T:  06/21/2004  Job:  [id number]

## 2010-09-01 NOTE — Assessment & Plan Note (Signed)
[facility]                             PULMONARY OFFICE NOTE   Sheila Cisneros, Sheila Cisneros                        MRN:          [id number]  DATE:04/30/2006                            DOB:          April 14, 1941    April 30, 2006   PROBLEMS:  1. Chronic bronchitis/bronchiectasis.  2. Chronic rhinosinusitis.  3. Hypertension.  4. History of hepatitis.  5. Gout.   HISTORY:  Scheduled follow-up.  She had seen Dr. Renae Gloss last week with  an approximately two-week history of increased chest congestion and  malaise that began with chills and included some tussive pain under the  right breast.  She had a lot of sputum initially.  She is now on Avelox  and sputum amounts are decreasing.  Chest [facility] yesterday at [facility] showed bilateral lower lobe pneumonia, greater on  the right, with small bilateral pleural effusions and stable chronic  bronchitic changes.  Her CT scan at [facility] in 2005, had shown  bronchiectasis which is consistent with her long clinical history.  She  has not recognized obvious sinusitis symptoms.  She is using her  nebulizer at home.   MEDICATIONS:  1. Verapamil.  2. Mucinex.  3. Benicar.  4. Triamterene/hydrochlorothiazide.  5. Allegra D.  6. Vitamins.  7. Home nebulizer, uses DuoNeb and Pulmicort 0.5 mg.  8. Aspirin 81 mg.  9. Nasonex.   DRUG INTOLERANCES:  PENICILLIN with rash.  LEVAQUIN previously had  caused itch but she tolerated Cipro.  CODEINE caused angioedema.   OBJECTIVE:  VITAL SIGNS:  Weight 148 pounds, blood pressure 110/56,  pulse regular 96, room air saturation 98%.  CHEST:  Wheezy, congested cough.  No rub or dullness.  She is not using  accessory muscles.  EXTREMITIES:  No cyanosis or edema.   IMPRESSION:  1. Exacerbation of asthma with chronic obstructive pulmonary disease,      bilateral lower lobe pneumonia, community acquired, with recurrent      antibiotic exposures.  2.  Bronchiectasis.  3. History of sinusitis and surgery.   PLAN:  She has just started the Avelox.  I discussed conservative and  supportive care emphasizing fluids and rest and we discussed Mucinex.  For now, we need to give this time to clear if it will.  I have offered  to see her again in four months for long-term follow-up, earlier p.r.n.     Clinton D. Maple Hudson, MD, Tonny Bollman, FACP  Electronically Signed    CDY/MedQ  DD: 04/30/2006  DT: 05/01/2006  Job #: [id number]   cc:   Merlene Laughter. Renae Gloss, M.D.

## 2010-09-01 NOTE — Consult Note (Signed)
NAME:  Sheila Cisneros, LACKMAN NO.:  [id number]   MEDICAL RECORD NO.:  [id number]          PATIENT TYPE:  INP   LOCATION:  5714                         FACILITY:  [facility]   PHYSICIAN:  Deanna Artis. Hickling, M.D.DATE OF BIRTH:  [birth year]   DATE OF CONSULTATION:  06/20/2004  DATE OF DISCHARGE:                                   CONSULTATION   CHIEF COMPLAINT:  Orbital cellulitis with venous thrombosis.   HISTORY OF THE PRESENT CONDITION:  The patient is a 70 year old right-handed  African-American woman who was admitted to the hospital today with right  orbital cellulitis.  The patient developed swelling of her eye beginning on  the weekend and things progressed rapidly; and, she presented to Dr. Lucky Cowboy on Monday with evidence of headache that was frontally predominant,  with malaise and chills, and swelling of eyelid, which became proptotic  today.  The patient was seen today by Dr. Loura Back who made the  diagnosis of orbital cellulitis and sent the patient for an MRI scan.  This  showed widespread involvement of the right orbit with possible abscess  formation, swelling of the pterygoids bilaterally, some involvement of the  left orbit, orbital venous thrombosis bilaterally,  cavernous sinus  thrombosis, and jugular venous thrombosis, right greater than left.  In  addition, there was marked sphenoid and ethmoid sinusitis.  The sphenoid was  completely filled.  There was also enhancement of the basilar meninges in  the anterior cranial fossa.   PAST MEDICAL HISTORY:  The past medical history is negative for diabetes,  deep venous thromboses or other coagulopathis.  It is positive for  hypertension and asthma.   MEDICATIONS:  The patient's current medications include; I was unable to  question her about specifics.   ALLERGIES:  Drug allergies; PENICILLIN.   PAST SURGICAL HISTORY:  Hysterectomy.   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  The patient does not smoke.  She does not  drink alcohol.   FAMILY HISTORY:  The patient's parents died of cardiovascular disease.  No  history of thrombophilia.   PHYSICAL EXAMINATION:  VITAL SIGNS:  On examination today the patient's  temperature is 98, blood pressure 140/55 and resting pulse 96.  HEENT:  On general physical examination the patient has significant  proptosis and chemosis of the right eye, erythema and the eye is ptotic.  The patient has inflamed nasal passages.  Oropharynx is unremarkable.  LUNGS:  The lungs are clear.  HEART:  The heart has no murmurs.  Pulses are normal.  ABDOMEN:  The abdomen is soft and nontender.  Bowel sounds normal.  EXTREMITIES:  The extremities are well formed without edema or cyanosis.  NEUROLOGIC EXAMINATION:  The patient is awake and alert.  No dysphagia.  Cranial nerves:  Pupils are equal and round.  There is severe photophobia of  the right eye with chemosis, proptosis and right eye with ptosis.  Extraocular movements; there is a right fixed.  She has a certain  distraction  of her superior gaze.  The left eye shows full range of motion.  She has symmetrical facial strength otherwise.  Motor examination;  there is  normal tone and mass, good fine motor movements and no drift.  Sensation;  intact to cold, vibration and stereoagnosis.  Cerebellar examination:  Good  finger-to-nose and rapid repetitive movements.  Gait is normal.  Deep tendon  reflexes are diminished.  The patient has bilateral flexor plantar  responses.   IMPRESSION:  1.  Orbital cellulitis with wide tissue inflammation, right greater than      left orbit and both pterygoids.  2.  Venous occlusions, intraorbital, cavernous and jugular, and sphenoid and      ethmoid sinusitis.   PLAN:  1.  Debride and drain the sphenoid sinus by Allegra Grana.  2.  Vancomycin antibiotic.  3.  IV heparin, no load bolus per pharamacy  procedure.  4.  Flagyl 500 mg IV q.8 hours is also being used.   PROGNOSIS:  The patient's prognosis is guarded at this time.   I discussed the case with Drs. Pollyann Kennedy and [employer] and also  looked at  [employer], which strongly recommends the use of heparin  in venous thrombosis, infectious or otherwise.      WHH/MEDQ  D:  06/20/2004  T:  06/21/2004  Job:  [id number]   cc:   Salley Scarlet., M.D.  [address]  [place]  [place] [address]  Fax: [phone]   Jeannett Senior. Pollyann Kennedy, MD  [phone]  [place] [address]  Fax: [phone]

## 2010-09-01 NOTE — H&P (Signed)
NAME:  Sheila Cisneros, Sheila Cisneros                           ACCOUNT NO.:  [id number]   MEDICAL RECORD NO.:  [id number]                   PATIENT TYPE:  INP   LOCATION:  5151                                 FACILITY:  [facility]   PHYSICIAN:  Clinton D. Maple Hudson, M.D.              DATE OF BIRTH:  04/27/1940   DATE OF ADMISSION:  03/25/2002  DATE OF DISCHARGE:                                HISTORY & PHYSICAL   ADMISSION DIAGNOSES:  1. Asthmatic bronchitis with exacerbation.  2. Chronic sinusitis.  3. Hypertension.   HISTORY OF PRESENT ILLNESS:  A 70 year old African-American woman admitted  through the office with complaint of shortness of breath and cough.  She has  had significant asthmatic bronchitis over the last 6-10 years and has  required multiple medications and diagnostic efforts.  She had been very  clear in November after a prolonged course of antibiotics, then gradually  began wheezing and was congested again, and especially in the last 3-4 days,  has had marked congestion and dyspnea.  Low grade fever, if any, sweats  which she blames on hot flashes, no hard chills.  A little sore throat, no  myalgias.  She was started back on prednisone, but has failed to respond and  upon presentation again to the office today, she is admitted in an attempt  to clear and stabilize her.   REVIEW OF SYSTEMS:  No pain, blood, high fever, or hard chills.  Generally  weak and dyspneic with tiresome congested cough, scant white sputum, no  adenopathy, no rash, no pain, no blood.  She denies nausea, vomiting,  diarrhea, dysuria, or change in bowel or bladder habits.  She has been  compliant with home medications.  Now complaining of some tussive soreness  over left inguinal incision area scar, consistent with possible early  tussive hernia, although no mass felt.   HOME MEDICATION:  1. Advair 100/50.  2. Protonix 40 mg q.d.  3. Tussionex.  4. Albuterol inhaler.  5. Allegra.  6. Sudafed.  7. Acapella  type flutter valve.  8. Several antibiotic rounds earlier this fall.   ALLERGIES:  1. CODEINE.  2. PENICILLIN.  3. QUINOLONES.  These cause rash and itching.  She tolerates Keflex well.   PPD STATUS:  Repeatedly negative.   PAST MEDICAL HISTORY:  1. Hypertension.  2. 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  3. Allergic rhinitis.  4. Chronic sinusitis.  5. Bilateral sinus surgery.  6. Positive allergy skin tests in the past, dropped off of vaccine early.  7. Pneumonia at least once.  Has had Pneumovax and flu vaccine.  IGE level     235.  Normal serum immunoglobulins including IGG subclasses, mildly     elevated IGA.  Negative sweat chloride.  April 2003 barium swallow     negative  for reflux.  June 2002, infectious disease consult with Dr. Enedina Finner, identified chronic sinusitis and asthmatic bronchitis and     recommended nasal saline lavage.  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  Repeat sputum culture June 2003 was negative     for AFB and grew only normal flora with mixed Gram's stain.  Pulmonary     function tests in October 2003 showed moderate restriction and     significant reversible obstructive disease.   PAST SURGICAL HISTORY:  1. Total hysterectomy.  2. Tonsillectomy.  3. Sinus surgery.   SOCIAL HISTORY:  Married, works as a Garment/textile technologist, never smoked, no  children.   FAMILY HISTORY:  Father died of colon cancer, mother died of MI.   PHYSICAL EXAMINATION:  GENERAL:  Alert and oriented.  Pleasant, well-  developed, well-nourished woman.  VITAL SIGNS:  Temperature 97.8, pulse 100, respirations 22 on room air with  92% saturation on room air, blood pressure 138/76.  SKIN:  No rash.  ADENOPATHY:  None.  HEENT:  Oral mucosa clear.  Conjunctivae clear.  NECK:  No JVD or  stridor.  CHEST:  Diffuse, deep congestion, hard coughing is not productive.  Some end  expiratory wheeze.  No dullness or rhonchi.  BREASTS:  Not examined, problems denied.  HEART:  Regular rhythm, no murmur or gallop.  ABDOMEN:  Soft, nontender, with bowel sounds present.  No  hepatosplenomegaly.  Incision scar over lower part of left lower quadrant in  an area she indicates gets tender with hard coughing.  No bulge felt.  PELVIC/RECTAL:  Not done, not indicated.  EXTREMITIES:  No cyanosis, clubbing, or edema.   LABORATORY DATA:  Chest x-ray:  No active infiltrate, effusion, or  adenopathy.    IMPRESSION:  Significant exacerbation of asthmatic bronchitis which is  becoming chronic and difficult to manage.  We are evaluating her for a  possible trial of Xolair.  I suspect a viral trigger to the current  exacerbation.  She has had flu shot and we may not be able to clear the  viral component, but I anticipate use of cortisone, bronchodilators,  appropriate antibiotics, and hopefully she will clear quickly.                                                   Clinton D. Maple Hudson, M.D.    CDY/MEDQ  D:  03/25/2002  T:  03/25/2002  Job:  [id number]   cc:   Merlene Laughter. Renae Gloss, M.D.  [phone]  [place]  [place] [address]  Fax: [phone]

## 2010-09-01 NOTE — Assessment & Plan Note (Signed)
 [facility]                               PULMONARY OFFICE NOTE   Sheila Cisneros, Sheila Cisneros                        MRN:          [id number]  DATE:01/01/2006                            DOB:          [birth year]    PROBLEM:  1. Chronic bronchitis/bronchiectasis.  2. Chronic rhinosinusitis.  3. Hypertension.  4. History of hepatitis.  5. Gout.   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  She has been more congested in the last week or so, but denies fevers or  chills.  Sputum is sometimes yellow or green.  She has not gone back to  [facility] and when she ran out of the maintenance Azithromycin they were  keeping her on she just stopped it.  I cannot tell from talking with her  whether it suppressed her recurrent bronchitis.   MEDICATIONS:  Verapamil, Mucinex, Benicar, triamterene/HCTZ, Allegra D 12  hour, home nebulizer with DuoNeb up to q.i.d., home nebulizer with Pulmicort  0.5 mg b.i.d., enteric aspirin 81 mg.   ALLERGIES:  Drug intolerance to PENICILLIN with a rash, LEVAQUIN with  itching, TROVAN.  She can tolerate Avelox and Cipro.   LABORATORY DATA:  A serum protein electrophoresis on June 27, 2005, showed  a nonspecific elevation reported as sometimes seen in association with  acute inflammatory response pattern.  There was no M-spike.   PHYSICAL EXAMINATION:  Weight 147 pounds, blood pressure 120/80, pulse  regular at 80, room air saturation 100%.  She is sniffling, a little puffy  around the eyes, wheezing congestion and mild rhonchi bilaterally.  Heart  sounds regular without murmur.  No evident fever or adenopathy.  or gallop.   IMPRESSION:  Acute exacerbation of her chronic bronchitis/chronic  obstructive pulmonary disease with a rhinitis and the potential for  reactivation of her sinusitis.  We asked questions trying to determine  if  she could tolerate cephalosporins.  As best we can tell, she can.  We are  going to try Omnicef 300 mg b.i.d. for seven days and she will report  response.   PLAN:  Omnicef as above.  Schedule return in four months, earlier p.r.n.                                   Clinton D. Maple Hudson, MD, FCCP, FACP   CDY/MedQ  DD:  01/01/2006  DT:  01/02/2006  Job #:  [id number]   cc:   Merlene Laughter. Renae Gloss, M.D.  Jefry H. Pollyann Kennedy, MD

## 2010-09-01 NOTE — H&P (Signed)
NAME:  Sheila Cisneros, Sheila Cisneros NO.:  [id number]   MEDICAL RECORD NO.:  [id number]          PATIENT TYPE:  INP   LOCATION:  5714                         FACILITY:  [facility]   PHYSICIAN:  Salley Scarlet., M.D.DATE OF BIRTH:  [birth year]   DATE OF ADMISSION:  06/20/2004  DATE OF DISCHARGE:                                HISTORY & PHYSICAL   CHIEF COMPLAINT:  Pain and swelling, right eye, with severe headache for  approximately one-week's duration.   HISTORY OF PRESENT ILLNESS:  This 70 year old lady presented to my office  today with complaints of pain and swelling of the right eye since early this  morning.  She states that she has had severe headache which has been of  increasing severity since Thursday of last week which is approximately five  days.  The headache became so severe that she went to [facility] Emergency  Room on Sunday where she was given pain medication and questionably some  antibiotics.  Without getting relief, she saw Dr. Lucky Cowboy yesterday who  told her that she had sinusitis and placed her on pain medications and  Avelox.  Dr. Gerilyn Pilgrim apparently did an MRI which showed some sphenoidal sinus  inflammation.  Apparently the MRI was not too remarkable.  When she  presented to may office today, there was marked proptosis with complete  ptosis of the right eye.  There was also limitation of motion of the right  eye with diplopia in all directions of gaze.  Because of severity and the  progressive nature of her disease, we discussed the case with Dr. Gerilyn Pilgrim who  agreed with me that the patient should be admitted.  She is, therefore,  admitted tonight with the diagnosis of acute orbital cellulitis.   PAST MEDICAL HISTORY:  1.  History of hypertension which is being controlled with medications.  2.  Bronchiectasis and asthma along with some other type of pulmonary      disease which is being followed locally by Dr. Jetty Duhamel.  3.  She is also being seen  at the hospital in [facility] for this problem.   There is no history of heart disease, diabetes, neurological problems.   SOCIAL HISTORY:  This lady does not smoke or drink.  She is married with no  children.  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   REVIEW OF SYSTEMS:  Otherwise negative.   PHYSICAL EXAMINATION:  GENERAL:  A 70 year old, well-developed, well-  nourished black female in moderate distress.  VITAL SIGNS:  Recorded as temperature 98.5, pulse 68, respirations 16, blood  pressure 114/56 with O2 saturation 96% on room air.  HEAD:  Normocephalic.  EARS, NOSE, THROAT:  Within normal limits.  EYES:  Marked proptosis of the right eye with complete ptosis of the upper  lid of the right eye.  There is extensive conjunctival ecchymosis with  limitation of motion of the right eye and upgaze and lateral gaze.  The  pupils are both 3 mm, round, regular, reactive to light with no afferent  pupillary defects.  Fundi are normal with  disks, vessels, and maculae found  to be normal.  Visual acuity with glasses shows vision on the right of  approximately 20/60, 20/40 on the left.  Slit lamp examination showed cornea  to be clear.  The anterior chamber was deep and clear without cells or  flare.  The irises and  normal.  NECK:  Supple without neck vein distention.  CHEST:  Symmetrical with deep respiratory excursion.  LUNGS:  Few rhonchi and basilar wheezes.  HEART:  Rate and rhythm normal.  No extra sounds or murmurs appreciated.  ABDOMEN:  Soft.  Bowel sounds active.  No organomegaly.  NEUROLOGIC:  No abnormal reflexes.  There is limitation of motion of the  right eye in upgaze and laterally.  There is ptoses of the upper lid of the  right eye.  This is suggested of 3rd nerve and 6th nerve involvement.  EXTREMITIES:  Within normal limits.   ADMISSION DIAGNOSIS:  Acute orbital cellulitis, right eye.      TB/MEDQ  D:  06/20/2004  T:  06/20/2004  Job:  [id number]

## 2010-09-01 NOTE — Consult Note (Signed)
NAME:  Sheila Cisneros, OSSO NO.:  [id number]   MEDICAL RECORD NO.:  [id number]          PATIENT TYPE:  INP   LOCATION:  5714                         FACILITY:  [facility]   PHYSICIAN:  Jefry H. Pollyann Kennedy, MD     DATE OF BIRTH:  Aug 27, 1940   DATE OF CONSULTATION:  06/20/2004  DATE OF DISCHARGE:                                   CONSULTATION   HISTORY OF PRESENT ILLNESS:  This is a 70 year old with a couple month  history of chronic headache.  She was seen by Dr. Lucky Cowboy yesterday in  the office for sinusitis.  Since that time, her right eye started swelling  and she started having some double vision.  She was admitted to the hospital  by Dr. 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   PAST MEDICAL HISTORY:  1.  Chronic lung disease.  2.  Bronchiectasis.  3.  Currently being worked up for possible collagen vascular disease.  4.  Sinus surgery with Dr. Veverly Fells. Arletha Grippe about 2-3 years ago.   SOCIAL HISTORY:  No history of smoking.  She is employed as a Psychologist, forensic at [employer].   MEDICATIONS:  She has been on low dose prednisone with occasional bursts of  higher doses over the past several years.   PHYSICAL EXAMINATION:  GENERAL:  She is a healthy-appearing lady, acutely  ill.  HEENT:  There is no palpable neck masses.  Oral cavity and pharynx are  clear.  Nasal exam reveals significant bilateral mucosal congestion with  some exudate bilaterally.  The left eye is normal to inspection.  There is  good ocular mobility and normal pupillary response to light.  The right eye  has significant periorbital edema with conjunctival edema and some  restricted upward gaze.  There is good pupillary response.  She does have  gross vision intact in the right eye, but she does complain of double vision  with  binocular vision.   LABORATORY DATA AND X-RAY FINDINGS:  The MRI scan was reviewed.  An urgent  CT scan was ordered which reveals a complete opacity of the left sphenoid  sinus.  Remaining sinuses are basically clear and there is evidence of  partial thrombosis of the orbital veins, the cavernous sinus and the jugular  veins bilaterally.   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   RECOMMENDATIONS:  Recommend immediate transfer to the operating room to  undergo left sphenoidotomy endoscopically.  We had a length discussion about  the risks of this disorder and the main goal is to preserve her vision if  possible.  We did discuss that there is a possibility of losing vision.      JHR/MEDQ  D:  06/21/2004  T:  06/21/2004  Job:  [id number]   cc:   Merlene Laughter. Renae Gloss, M.D.  [address]  [address]  [place]  [place] [address]  Fax: [phone]

## 2010-09-01 NOTE — Discharge Summary (Signed)
NAME:  Sheila Cisneros, Sheila Cisneros NO.:  [id number]   MEDICAL RECORD NO.:  [id number]          PATIENT TYPE:  INP   LOCATION:  5714                         FACILITY:  [facility]   PHYSICIAN:  Gertha Calkin, M.D.DATE OF BIRTH:  09/10/1940   DATE OF ADMISSION:  06/20/2004  DATE OF DISCHARGE:  06/28/2004                                 DISCHARGE SUMMARY   CONSULTATIONS:  Jefry H. Pollyann Kennedy, MD, Fransisco Hertz, M.D., Deanna Artis.  Sharene Skeans, M.D., Clinton D. Maple Hudson, M.D.   DISCHARGE DIAGNOSES:  1. Right periorbital cellulitis.  2. Asthma/chronic bronchitis.  3. Hypertension.     DISCHARGE MEDICATIONS:  1. Avelox eye drops 0.3% 1 gtt q.i.d.  2. Maxzide 37.5/25 mg p.o. daily.  3. Verapamil 250 mg p.o. daily.  4. Iodine 400 mg p.o. daily.  5. Lovenox 65 mg subcu injection tonight and then tomorrow morning (March      16).  6. Vancomycin 1 g IV q.12 h.  7. Flagyl 500 mg IV q.8 h.  8. Azactam 1 g IV q.8 h. (all IV antibiotics to be continued two weeks      post discharge).  9. Benicar resume prior home dose.  10.Coumadin 2 mg at bedtime.     DIAGNOSTIC DATA:  1. Chest x-ray June 20, 2004:  Impression:  Left basilar atelectasis.  2. CT maxillofacial with contrast June 20, 2004:  Impression:  Substantial      inflammatory changes in the preseptal tissues of the right orbit with      14 x 3 mm focal fluid collection continues at the lateral aspect of the      right globe.  This collection appears to resolve within preseptal      tissues.  The right superior ophthalmic vein is enlarged and has      features consistent with thrombosis with adjacent edema/inflammatory      changes within the intracoronal __________  vein.  Non-opacification of      the cavernous sinus is consistent with thrombosis.  There is also      nonocclusive thrombosis of both proximal internal jugular veins.      Extensive paranasal sinus disease with post-surgical change in the      maxillary sinuses.  3.  MRI of the brain with and without contrast June 20, 2004:  Impression:      Pansinusitis and __________  cellulitis on the right.  Bilateral      cavernous sinus thrombosis and bilateral superior orbital vein      thrombosis, right greater than left.  There is bilateral jugular vein      thrombosis.  There is also basal meningitis.  Extension of infection to      the foramen ovale bilaterally and to the pterygoid musculature      bilaterally where myositis is present.  No abscess is seen, no acute      infarct seen (those results were relayed to Dr. Mitzi Davenport).  4. MR orbit, face, neck with and without contrast March 10:  Impression:      Persistent thrombosis of superior ophthalmic vein  bilaterally,      cavernous sinuses bilaterally, inferior petrosal sinuses bilaterally      with slight improvement in the amount of thrombosis within the jugular      veins bilaterally.  Pansinus opacification.  Meningeal enhancement      appears to have spread along the left temporofrontal region.  Other      areas of meningeal enhancement remain and may represent spread of      infection, vascular engorgement type changes or a combination of both.      No evidence at this time of cerebritis or acute infarct.  Similar      appearance of nonspecific white matter changes.  5. MR venography:  Impression:  Since prior exam there has been slight      decrease in the amount of thrombosis within the jugular veins.  6. MR angiography of the circle of Willis:  All the major intracranial      vessels are patent.  7. On March 12, slow-guided central venous line successful placement of      PICC.  8. MR over face and neck with and without contrast June 26, 2004:      Progression of sinusitis with decreased fluid in left sphenoid and left      maxillary sinuses.  No changes in bilateral superior orbital vein      thrombosis.  __________  is stable on the right.  Cavernous sinus      thrombosis and jugular vein  thrombosis bilaterally have improved in the      interval.  Negative for acute infarct or abscesses.     HOSPITAL COURSE:  Problem 1.  Right periorbital cellulitis.  Please see H&P  for details of admission.  This patient was initially admitted to the ENT  service and was transferred to our service two days prior to discharge.  Briefly, the patient's hospital course was uncomplicated after initial  admission.  He was consulted for antibiotic recommendations (see their  consultative note).  ENT took the patient to the OR on the day of admission  and findings noted in the chart for completely obstructed left sphenoid with  a large fungal ball located.  At that time additional antibiotics were added  to cover for fungal species.  Multiple scans were obtained throughout the  course while following the patient clinically.  The patient slowly had  decrease of her swelling.  There was some question regarding meningeal  enhancement per above reports.  Neurology was consulted at that time, and  followed throughout her hospital course (please see their consultative notes  for details).  The patient was continued on IV antibiotics and clinically  improved, and on the day of discharge the patient had significantly  decreased swelling from admission and significantly well controlled pain and  was able to open her eyes and vision was restored.  The patient at discharge  had no complaints, was able to open both eyes, and was discharged home on IV  antibiotic therapy to continue for two weeks following discharge, as noted  above.   Problem 2.  Hypertension.  There were not any significant complications  during this hospitalization.  The patient had good blood pressure control  and was discharged home on medications listed in the list dictated above.   Problem 3.  Asthma/bronchitis.  Pulmonary was involved.  However, there were no acute issues that needed any intervention or complications throughout   this hospitalization.  There was some concern with sinus  thrombi and airway  compromise; however, this remained stable throughout.   DISPOSITION:  Home with IV antibiotics.   FOLLOWUP:  Follow up with ENT as well as infectious diseases for medication  trough level.  Follow up with pulmonary p.r.n. and follow up with Dr.  Sharene Skeans in one month following discharge.      JD/MEDQ  D:  09/07/2004  T:  09/08/2004  Job:  [id number]   cc:   Deanna Artis. Sharene Skeans, M.D.  [address]  [address]  [place]  [place] [address]  Fax: [phone]   Salley Scarlet., M.D.  [address]  [place]  [place] [address]  Fax: [phone]   Jeannett Senior. Pollyann Kennedy, MD  [phone] [place]  [place] [address]  Fax: [phone]   Lacretia Leigh. Ninetta Lights, M.D.  [address]  [place]  [place] [address]  Fax: [phone]   Fransisco Hertz, M.D.  [address]  [place] [address]  Fax: [phone]

## 2010-09-01 NOTE — H&P (Signed)
NAME:  Sheila Cisneros, Sheila Cisneros NO.:  [id number]   MEDICAL RECORD NO.:  [id number]          PATIENT TYPE:  INP   LOCATION:  5714                         FACILITY:  [facility]   PHYSICIAN:  Mallory Shirk, MD     DATE OF BIRTH:  23-Mar-1941   DATE OF ADMISSION:  06/20/2004  DATE OF DISCHARGE:                                HISTORY & PHYSICAL   HISTORY OF PRESENT ILLNESS:  Ms. 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  Presented to Dr. Jeannie Fend, ophthalmology, office on June 20, 2004 with complaints of pain  and swelling in the right eye.  She also had severe headaches of increasing  severity for a week prior to admission.  She went to the ER at [facility]  and was given pain medication and antibiotics.  She then saw Dr. Lucky Cowboy  who told her that she had sinusitis, placed her on pain medication and  Avelox.  Dr. Gerilyn Pilgrim did an MRI which showed some sphenoid sinus inflammation.  The MRI was apparently not remarkable.  When she presented to the  ophthalmologist office there was marked proptosis with complete ptosis of  the right eye.  There was also limitation of motion of the right eye with  diplopia in all directions of gaze.  Patient was then admitted to the  hospital with a diagnosis of acute orbital cellulitis.   Patient was subsequently taken to the OR on June 20, 2004 and had a left  endoscopic sphenoidotomy.  Patient tolerated this procedure well.  Patient  was also seen by Dr. Sharene Skeans, neurology.  In addition to orbital cellulitis  with white tissue inflammation, right greater than left orbit and both  pterygoids, venous occlusions, intraorbital, cavernous, and jugular were  noted and sphenoid and ethmoid sinusitis was also noted.  Patient is being  followed by ID, ENT, and ophthalmology at the present time.   On examination today patient states that this is the best that she has felt  in several days.  She is able to  open her right eye.  The swelling has  decreased and she is able to ambulate around room without difficulty.  Patient has no complaints at the present time.  Her headache has decreased  to a point where it is just a minor irritation that is episodic at the  present time.   PAST MEDICAL HISTORY:  1.  Hypertension.  2.  Bronchiectasis and asthma with some other type of pulmonary disease      followed by Dr. Jetty Duhamel.  3.  Patient has also been seen in [facility] for the pulmonary problem.   MEDICATIONS:  1.  Tylenol 650 p.o. q.24h.  2.  Albuterol two puffs b.i.d.  3.  Amphotericin 30 mg IV daily.  4.  Aztreonam 1 g IV q.8h.  5.  Benadryl 50 mg p.o. q.24h.  6.  Etodolac 400 mg p.o. daily.  7.  Gatifloxacin one drop in the right eye q.i.d.  8.  Guaifenesin/Humibid 600 mg p.o. b.i.d.  9.  Heparin by  protocol.  10. HCTZ 12.5 mg p.o. daily.  11. Avapro 450 mg p.o. q.h.s.  12. Metronidazole 500 mg IV q.8h.  13. Pantoprazole 40 mg p.o. daily.  14. Maxzide one tablet p.o. daily.  15. Vancomycin 1 g IV q.12h.  16. Verapamil 240 mg p.o. daily.  17. Warfarin per pharmacy protocol.  18. Syllium one packet p.o. t.i.d. p.r.n.  19. Oxymetazoline Afrin nasal spray p.r.n.  20. Oxycodone one to two tablets p.o. q.4h. p.r.n.   ALLERGIES:  Patient is allergic to PENICILLINS, QUINOLONES, CODEINE, TETANUS  TOXOID, TROVOFLOXACIN.   SOCIAL HISTORY:  Patient does not drink or smoke.  Married with two  children.  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   REVIEW OF SYSTEMS:  Other than HPI, essentially negative.   PHYSICAL EXAMINATION:  GENERAL:  A 70 year old African-American woman  looking younger than her stated age of 63.  Alert and oriented x3.  In no  acute distress.  VITAL SIGNS:  Blood pressure  137/60, pulse 64, respirations 20, temperature  99.4, saturations 98% on room air.  HEENT:  Normocephalic, atraumatic.  Right periorbital swelling noted.  Right  conjunctiva erythema noted.  Oropharynx  nonerythematous.  NECK:  Supple.  No LAD.  No JVD.  LUNGS:  Mild bibasilar crackles.  No wheezes.  No respiratory distress  noted.  CARDIOVASCULAR:  S1 plus S2.  Regular rate and rhythm.  No murmurs, rubs, or  gallops.  ABDOMEN:  Positive bowel sounds, soft, nontender.  No masses.  EXTREMITIES:  No cyanosis or clubbing.  Trace edema in the lower  extremities, nonpitting.  NEUROLOGIC:  Nonfocal.   LABORATORIES:  WBC 20.0, hemoglobin 10.2, hematocrit 29.3, platelets 396.  PT 13.9, INR 1.1.  Sodium 137, potassium 3.9, chloride 106, carbon dioxide  25, glucose 125, BUN 10, creatinine 0.7, calcium 8.4, phosphorous 1.6,  magnesium 1.9.  Hemoglobin A1C 5.9.   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  1.  Right orbital cellulitis, resolving.  Patient's pain has decreased.  She      is being followed by Dr. Nadyne Coombes, ophthalmology.  She is on      pain medications, Tylenol and Percocet for pain as needed.  2.  Headaches.  Patient's headache has resolved.  Again, she is on Tylenol      and Percocet for pain.  3.  Infectious diseases.  Patient's wbc's are 20.0.  Patient is being      followed by infectious disease.  She is on vancomycin, Flagyl,      Aztreonam, and amphotericin.  4.  Anemia.  Patient's H&H is 10.2/29.3, appears to be baseline since it was      10.2/29.6 on June 23, 2004.  We will continue to guaiac stools.  No      obvious signs of bleeding at the present time.  5.  Venous occlusive disease, intraorbital, cavernous, and jugular veins.      Patient is on heparin by protocol.  Has also been started on Coumadin by      pharmacy protocol.  Heparin will be continued until INR is therapeutic.      INR today is 1.1.  6.  Prophylaxis.  Patient is on Protonix and is already on heparin. 7.  Hypertension.  Patient is on HCTZ, Maxzide, verapamil, and Avapro.  Her      blood pressures are well  controlled with a systolic 137/60.  We will      continue to  monitor her blood pressure and adjust her antihypertensives      as needed.  8.  Bronchitis.  Patient is being followed by Fannie Knee, M.D. on an      outpatient basis.  She has been seen by Dr. Maple Hudson while in the hospital      and will be followed up in his office.   DISPOSITION:  After resolution of acute symptoms and acute issues, patient  will be discharged to home with follow-up at primary care, pulmonology, and  ophthalmology as needed.      GDK/MEDQ  D:  06/26/2004  T:  06/26/2004  Job:  [id number]   cc:   Merlene Laughter. Renae Gloss, M.D.  [address]  [address]  [place]  [place] [address]  Fax: [phone]

## 2010-09-01 NOTE — Op Note (Signed)
NAME:  Sheila, Cisneros NO.:  [id number]   MEDICAL RECORD NO.:  [id number]          PATIENT TYPE:  INP   LOCATION:  3007                         FACILITY:  [facility]   PHYSICIAN:  Suzanna Obey, M.D.       DATE OF BIRTH:  1941-02-02   DATE OF PROCEDURE:  06/30/2004  DATE OF DISCHARGE:                                 OPERATIVE REPORT   PREOPERATIVE DIAGNOSIS:  Epistaxis.   POSTOPERATIVE DIAGNOSIS:  Epistaxis.   OPERATION PERFORMED:  Endoscopic examination of the nose with cleaning of  the maxillary and nasal contents with removal of additional fungus and  cauterization of the posterior middle turbinate and packing.   SURGEON:  Suzanna Obey, M.D.   ANESTHESIA:  General endotracheal tube.   ESTIMATED BLOOD LOSS:  Approximately 10 cc.   INDICATIONS FOR PROCEDURE:  The patient is a 70 year old who has had an  extensive sphenoid sinusitis with cavernous sinus thrombosis and meningeal  symptoms that resolved and was discharged on Tuesday.  She developed  epistaxis this evening from the left side which has been going on for  several hours.  An attempt in the emergency room was made to stop the  bleeding but it was all extremely posterior and the patient was very  sensitive and near impossible to control.  She was informed of the risks and  benefits of the procedure including bleeding, infection, scarring, packing,  further bleeding, chronic crusting of the nose, and risks of the anesthetic.  All questions were answered and consent was obtained.   DESCRIPTION OF PROCEDURE:  The patient was taken to the operating room and  placed in supine position.  After adequate general endotracheal tube  anesthesia, was draped in the usual sterile manner.  Oxymetazoline pledgets  were placed into the nose and then using the endoscope, the blood clots were  removed from the nasal cavity.  There was extensive clot up into the ethmoid  and this was not removed.  The bleeding appeared to be  coming from the  posterior remnant of the middle turbinate that was actively bleeding.  It  was cauterized with a suction cautery.  It continued to ooze around the  cautery and it was cauterized several times to where it did finally mostly  stop.  There was also a little bit of bleeding site from this septum which  was cauterized as well.  Because she is on Coumadin, it was elected to place  some packing and a Surgicel Gelfoam sandwich was made and placed into the  ethmoid and posterior aspect of the nose and then a cut Merocel sponge was  then placed to hold this up in the nasal cavity to secure it in this  location.  The patient was then suctioned out and awakened, brought to  recovery in stable condition, counts correct.     JB/MEDQ  D:  06/30/2004  T:  06/30/2004  Job:  [id number]

## 2010-09-01 NOTE — Consult Note (Signed)
NAME:  Sheila Cisneros, DANSER NO.:  [id number]   MEDICAL RECORD NO.:  [id number]          PATIENT TYPE:  INP   LOCATION:  5714                         FACILITY:  [facility]   PHYSICIAN:  Clinton D. Maple Hudson, M.D. DATE OF BIRTH:  [birth year]   DATE OF CONSULTATION:  06/21/2004  DATE OF DISCHARGE:                                   CONSULTATION   PROBLEM:  Pulmonary consultation requested by Dr. Mitzi Davenport because of  bronchitis.   HISTORY:  This is a 70 year old nonsmoking woman admitted by Dr. Mitzi Davenport  with an acute cellulitis of the right eye and subsequent diagnosis of a  fungal sinusitis with cavernous sinus thrombosis who is now being treated  with intravenous vancomycin and Flagyl.   I have followed her with Dr. Gerilyn Pilgrim for several years because of a chronic  sinusitis and purulent bronchitis which has been difficult to treat. She has  had an extensive outpatient evaluation which has excluded alpha I  antitrypsin deficiency, cystic fibrosis, sarcoid, and other easily  correctable problems as a basis for her chronic congested cough. Symptoms  tend to respond to antibiotics for a while. She has cultured significant  pathogens in the past, I believe including Pseudomonas without old chart in  front of me. More recently she had been referred to the Pulmonary Division  at [facility]. I last saw her once in the last two months and have no  records from [facility]. They had given her a prolonged course of Cipro and  she had been maintained on low-dose prednisone, most recently 2.5 mg daily.   REVIEW OF SYSTEMS:  Recent headache and facial swelling related to HPI.  Currently, her cough and chest congestion are at the good end of her usual  spectrum of variation. She still expects to have some congested cough and  bring up some sputum most days. There has been no palpitation, nausea,  vomiting, bleeding, or peripheral edema.  Allergy evaluation has been done  in the past without  substantial impact on her course.   SOCIAL HISTORY:  Nonsmoker, nondrinker. Married. Works as a Probation officer for anesthesia.   PAST HISTORY:  1.  Hypertension.  2.  Chronic bronchitis/bronchiectasis with asthma.  3.  Chronic sinusitis.   OBJECTIVE:  VITAL SIGNS:  Blood pressure 155/73, pulse 77, respirations 18,  temperature 99.1, oxygen saturation 93% on room air.  GENERAL:  She is alert and conversant.  HEENT:  Right eyelids are closed and edematous. I do not really feel  adenopathy. There is nasal congestion.  HEART:  Regular.  ABDOMEN:  Bowel sounds are normal to my exam.  CHEST:  Mild patchy rhonchi or congestion with no cough, rub, or dullness  during examination. Work of breathing is not increased. Speech is clear.  EXTREMITIES:  There is no cyanosis, clubbing, or edema.  NEUROLOGIC:  Note that she is fully oriented. Speech is clear on the  telephone and she recognized me immediately.   IMPRESSION:  1.  Acute retro-orbital cellulitis.  2.  Chronic sinusitis, apparently fungal.  3.  Cavernous sinus thrombosis.  4.  Chronic asthmatic bronchitis.   COMMENT:  She is not needing much specific treatment for her chest now. She  has a p.r.n. albuterol inhaler and I will provide a standby p.r.n. nebulizer  treatment at nursing discretion in case she has problems. Hopefully,  removing the stimulation from her sinuses may allow the chest problems to  clear as well. I will be seeing her again in follow-up when she leaves the  hospital and will be happy to see her here again if needed.      CDY/MEDQ  D:  06/21/2004  T:  06/21/2004  Job:  [id number]   cc:   Salley Scarlet., M.D.  [address]  [place]  [place] [address]  Fax: [phone]   Lucky Cowboy, MD  [address] [place]  [place] [address]  Fax: [phone]

## 2010-09-04 ENCOUNTER — Telehealth: Payer: Self-pay | Admitting: Internal Medicine

## 2010-09-04 NOTE — Progress Notes (Signed)
Quick Note:  LMTCB ______ 

## 2010-09-04 NOTE — Progress Notes (Signed)
Quick Note:  Noted for CT. ______

## 2010-09-04 NOTE — Telephone Encounter (Signed)
Pt is aware of results. 

## 2010-09-04 NOTE — Progress Notes (Signed)
Quick Note:  Pt came by the office and was talked to in person-CT has been scheduled and pt is aware. ______

## 2010-09-04 NOTE — Progress Notes (Signed)
Quick Note:  Pt aware of results and has appt tomorrow to se [facility](states not feeling well) ______

## 2010-09-05 ENCOUNTER — Ambulatory Visit (INDEPENDENT_AMBULATORY_CARE_PROVIDER_SITE_OTHER): Payer: Medicare Other | Admitting: Internal Medicine

## 2010-09-05 ENCOUNTER — Encounter: Payer: Self-pay | Admitting: Internal Medicine

## 2010-09-05 VITALS — BP 126/86 | HR 77 | Temp 98.5°F | Ht 62.5 in | Wt 138.6 lb

## 2010-09-05 DIAGNOSIS — J42 Unspecified chronic bronchitis: Secondary | ICD-10-CM

## 2010-09-05 DIAGNOSIS — J328 Other chronic sinusitis: Secondary | ICD-10-CM

## 2010-09-05 MED ORDER — DOXYCYCLINE HYCLATE 100 MG PO TABS: 100.0000 mg | ORAL_TABLET | Freq: Two times a day (BID) | ORAL | Status: AC

## 2010-09-05 MED ORDER — ROFLUMILAST 500 MCG PO TABS: 500.0000 ug | ORAL_TABLET | Freq: Every day | ORAL | Status: DC

## 2010-09-05 NOTE — Patient Instructions (Signed)
Script sent for doxycycline   Sample and script for Daliresp, one daily after a meal.    Finish the antibiotic before you start this   Mucinex and fluids   Look at home for the cone-shaped Flutter plastic device you blow through to loosen mucus.

## 2010-09-05 NOTE — Assessment & Plan Note (Addendum)
Exacerbation. We discussed mucus clearing measures and encouraged mucinex. She will try to find her Flutter at home. We will give doxycycline. There is probably bronchiectasis. She is never completely clear.

## 2010-09-05 NOTE — Progress Notes (Signed)
  Subjective:    Patient ID: Sheila Cisneros, female    DOB: Aug 13, 1940, 70 y.o.   MRN: [id number]  Cough Associated symptoms include postnasal drip and rhinorrhea. Pertinent negatives include no chills, fever, shortness of breath or wheezing.   Subjective:    Patient ID: Sheila Cisneros, female    DOB: March 25, 1941, 70 y.o.   MRN: [id number]  HPI  07/31/10- 70 yoF followed for chronic bronchitis/ bronchiectasis and hx rhinosinusitis. Never smoker. Last here- January 30, 2010. She remained congested throough the winter, but she put up with it. No events needing medical visits. Pollen now makes cough somewhat more constant. Rarely productive. Denies fever or night sweats. Nebulizer still helps, used about twice daily. Denies  Chest pain or headache.   09/05/10- Chronic bronchitis, bronchiectasis, hx rhinosinusitis Reports increased chest congestion,producitve cough dark yellow.  Denies fever or sweat.She apparently didn't actually get a script for Daliresp last visit. We discussed side effects again. She is trying to exercise- water aerobics and walking.  Review of Systems  Constitutional: Negative for fever, chills, diaphoresis, activity change and fatigue.  HENT: Positive for congestion, rhinorrhea, sneezing and postnasal drip. Negative for nosebleeds and ear discharge.   Eyes: Positive for itching.  Respiratory: Negative for cough, choking, chest tightness, shortness of breath, wheezing and stridor.   See HPI Notes some right hip pains with walking    Objective:   Physical Exam .General- Alert, Oriented, Affect-appropriate, Distress- none acute  Skin- rash-none, lesions- none, excoriation- none  Lymphadenopathy- none  Head- atraumatic  Eyes- Gross vision intact, PERRLA, conjunctivae clear secretions  Ears- Hearing  Normal- canals, Tm - normal  Nose- Clear,  No-Septal dev, mucus, polyps, erosion, perforation   Throat- Mallampati II , mucosa clear , drainage- none, tonsils-  atrophic  Neck- flexible , trachea midline, no stridor , thyroid nl, carotid no bruit  Chest - symmetrical excursion , unlabored     Heart/CV- RRR , no murmur , no gallop  , no rub, nl s1 s2                     - JVD- none , edema- none, stasis changes- none, varices- none     Lung-  Deep non productive cough/ rattle- more pronounced., dullness-none, rub- none     Chest wall-   Abd- tender-no, distended-no, bowel sounds-present, HSM- no  Br/ Gen/ Rectal- Not done, not indicated  Extrem- cyanosis- none, clubbing, none, atrophy- none, strength- nl  Neuro- grossly intact to observation         Assessment & Plan:      Review of Systems  Constitutional: Negative for fever, chills, diaphoresis, activity change and fatigue.  HENT: Positive for congestion, rhinorrhea, sneezing and postnasal drip. Negative for nosebleeds and ear discharge.   Eyes: Positive for itching.  Respiratory: Positive for cough. Negative for choking, chest tightness, shortness of breath, wheezing and stridor.       Objective:   Physical Exam             Assessment & Plan:

## 2010-09-11 NOTE — Assessment & Plan Note (Signed)
Severe at times in the past, with marked periorbital edema requiring hospitalization. Much better controlled now.

## 2010-10-03 ENCOUNTER — Telehealth: Payer: Self-pay | Admitting: *Deleted

## 2010-10-03 NOTE — Telephone Encounter (Signed)
Spoke with patient-states a PA will be needed. I spoke with [facility]-pt paid out of pocket for RX. Will need an exception instead of PA.     Call [phone]; Pharmacy is faxing the paperwork to my ATTN.

## 2010-10-13 NOTE — Telephone Encounter (Signed)
I called [facility]-stated that PA was never started the first time I called therefore no paper work was sent-Attempted PA over the phone and PA is sent to clinical review-answer will be faxed to our office once a decision is reached.

## 2010-10-16 NOTE — Telephone Encounter (Signed)
Forms sent to [facility] to complete as initial PA review wasn't answered fully. Will need to fill out and fax back asap.

## 2010-10-26 ENCOUNTER — Encounter: Payer: Self-pay | Admitting: Internal Medicine

## 2010-10-26 ENCOUNTER — Telehealth: Payer: Self-pay | Admitting: Internal Medicine

## 2010-10-26 ENCOUNTER — Ambulatory Visit
Admission: RE | Admit: 2010-10-26 | Discharge: 2010-10-26 | Disposition: A | Discharge status: Home / Self Care | Payer: Medicare Other | Source: Ambulatory Visit | Attending: Orthopedic Surgery | Admitting: Orthopedic Surgery

## 2010-10-26 ENCOUNTER — Ambulatory Visit (INDEPENDENT_AMBULATORY_CARE_PROVIDER_SITE_OTHER): Payer: Medicare Other | Admitting: Internal Medicine

## 2010-10-26 ENCOUNTER — Other Ambulatory Visit: Payer: Self-pay | Admitting: Orthopedic Surgery

## 2010-10-26 VITALS — BP 122/80 | HR 67 | Ht 62.5 in | Wt 137.8 lb

## 2010-10-26 DIAGNOSIS — J42 Unspecified chronic bronchitis: Secondary | ICD-10-CM

## 2010-10-26 DIAGNOSIS — J328 Other chronic sinusitis: Secondary | ICD-10-CM

## 2010-10-26 DIAGNOSIS — R52 Pain, unspecified: Secondary | ICD-10-CM

## 2010-10-26 MED ORDER — ACETYLCYSTEINE 10 % IN SOLN: 4.0000 mL | Freq: Three times a day (TID) | RESPIRATORY_TRACT | Status: DC

## 2010-10-26 MED ORDER — CLARITHROMYCIN 500 MG PO TABS: ORAL_TABLET | ORAL | Status: AC

## 2010-10-26 NOTE — Telephone Encounter (Signed)
lmomtcb x 1. Per Florentina Addison, okay to use 2 pm slot today if still available when pt returns call.

## 2010-10-26 NOTE — Progress Notes (Signed)
Subjective:    Patient ID: Sheila Cisneros, female    DOB: 12/29/1940, 70 y.o.   MRN: [id number]  HPI    Review of Systems     Objective:   Physical Exam        Assessment & Plan:   Subjective:    Patient ID: Sheila Cisneros, female    DOB: September 22, 1940, 70 y.o.   MRN: [id number]  Cough Associated symptoms include postnasal drip and rhinorrhea. Pertinent negatives include no chills, fever, shortness of breath or wheezing.   Subjective:    Patient ID: Sheila Cisneros, female    DOB: 07-25-1940, 70 y.o.   MRN: [id number]  HPI  07/31/10- 70 yoF followed for chronic bronchitis/ bronchiectasis and hx rhinosinusitis. Never smoker. Last here- January 30, 2010. She remained congested throough the winter, but she put up with it. No events needing medical visits. Pollen now makes cough somewhat more constant. Rarely productive. Denies fever or night sweats. Nebulizer still helps, used about twice daily. Denies  Chest pain or headache.   09/05/10- Chronic bronchitis, bronchiectasis, hx rhinosinusitis Reports increased chest congestion,producitve cough dark yellow.  Denies fever or sweat.She apparently didn't actually get a script for Daliresp last visit. We discussed side effects again. She is trying to exercise- water aerobics and walking.  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  Acute visit. Cough worse in past week. Was put on prednisone 2 weeks ago for hip- didn't help chest-  pred ended yesterday. Occasional hot flash but no sustained fever. Continues Daliresp.  Review of Systems: Constitutional:   No weight loss, night sweats,  Fevers, chills, fatigue, lassitude. HEENT:   No headaches,  Difficulty swallowing,  Tooth/dental problems,  Sore throat,                No sneezing, itching, ear ache, nasal congestion, post nasal drip,   CV:  No chest pain,  Orthopnea, PND, swelling in lower extremities, anasarca,  dizziness, palpitations  GI  No heartburn, indigestion, abdominal pain, nausea, vomiting, diarrhea, change in bowel habits, loss of appetite  Resp: per HPI Skin: no rash or lesions.  GU: no dysuria, change in color of urine, no urgency or frequency.  No flank pain.  MS:  No joint pain or swelling.  No decreased range of motion.  No back pain.  Psych:  No change in mood or affect. No depression or anxiety.  No memory loss.        Objective:   Physical Exam .General- Alert, Oriented, Affect-appropriate, Distress- none acute  Skin- rash-none, lesions- none, excoriation- none  Lymphadenopathy- none  Head- atraumatic  Eyes- Gross vision intact, PERRLA, conjunctivae clear secretions  Ears- Hearing  Normal- canals, Tm - normal  Nose- Clear,  No-Septal dev, mucus, polyps, erosion, perforation   Throat- Mallampati II , mucosa clear , drainage- none, tonsils- atrophic  Neck- flexible , trachea midline, no stridor , thyroid nl, carotid no bruit  Chest - symmetrical excursion , unlabored     Heart/CV- RRR , no murmur , no gallop  , no rub, nl s1 s2                     - JVD- none , edema- none, stasis changes- none, varices- none     Lung-  Deep non productive cough/ rattle- more pronounced again this visit., dullness-none, rub- none     Chest wall-   Abd- tender-no, distended-no,  bowel sounds-present, HSM- no  Br/ Gen/ Rectal- Not done, not indicated  Extrem- cyanosis- none, clubbing, none, atrophy- none, strength- nl  Neuro- grossly intact to observation         Assessment & Plan:      .       Objective:   Physical Exam             Assessment & Plan:

## 2010-10-26 NOTE — Assessment & Plan Note (Signed)
Exacerbation. There may be some infection and some effect of air quality. She doesn't remember having a Flutter device recently. She is taking mucinex now. She does nebs up to 4 x daily as needed. If she can get Mucomyst, that may help. Discussed possibility of a pneumatic vest.

## 2010-10-26 NOTE — Patient Instructions (Addendum)
Script- Flutter device for pulmonary clearance----     Blow through 4 times,    Repeat 3 times daily  Script for Mucomyst- use in nebulizer 3 times daily x 3 days to loosen mucus------------>>           Pharmacy reports this is off the market and unavailable   Script antibiotic biaxin

## 2010-10-27 NOTE — Telephone Encounter (Signed)
Pt was seen by CDY on 10/26/10.

## 2010-10-29 NOTE — Assessment & Plan Note (Signed)
No recent exacerbation, but has required urgent hospitalization in the past.

## 2010-11-01 ENCOUNTER — Telehealth: Payer: Self-pay | Admitting: *Deleted

## 2010-11-02 NOTE — Telephone Encounter (Signed)
Spoke with patient-understands and agrees to have PFT done. I have placed the order and Scheduled test for Friday 11-03-10 at 4 pm.

## 2010-11-03 ENCOUNTER — Ambulatory Visit (INDEPENDENT_AMBULATORY_CARE_PROVIDER_SITE_OTHER): Payer: Medicare Other | Admitting: Internal Medicine

## 2010-11-03 DIAGNOSIS — J42 Unspecified chronic bronchitis: Secondary | ICD-10-CM

## 2010-11-03 LAB — PULMONARY FUNCTION TEST

## 2010-11-03 NOTE — Progress Notes (Signed)
PFT was done today.  

## 2010-11-08 ENCOUNTER — Encounter: Payer: Self-pay | Admitting: Internal Medicine

## 2010-11-22 ENCOUNTER — Other Ambulatory Visit: Payer: Self-pay | Admitting: Orthopedic Surgery

## 2010-11-22 DIAGNOSIS — M545 Low back pain: Secondary | ICD-10-CM

## 2010-11-29 ENCOUNTER — Ambulatory Visit
Admission: RE | Admit: 2010-11-29 | Discharge: 2010-11-29 | Disposition: A | Discharge status: Home / Self Care | Payer: Medicare Other | Source: Ambulatory Visit | Attending: Orthopedic Surgery | Admitting: Orthopedic Surgery

## 2010-11-29 DIAGNOSIS — M545 Low back pain: Secondary | ICD-10-CM

## 2010-12-20 ENCOUNTER — Other Ambulatory Visit: Payer: Self-pay | Admitting: Orthopedic Surgery

## 2010-12-20 DIAGNOSIS — M545 Low back pain, unspecified: Secondary | ICD-10-CM

## 2010-12-21 ENCOUNTER — Ambulatory Visit
Admission: RE | Admit: 2010-12-21 | Discharge: 2010-12-21 | Disposition: A | Discharge status: Home / Self Care | Payer: Medicare Other | Source: Ambulatory Visit | Attending: Orthopedic Surgery | Admitting: Orthopedic Surgery

## 2010-12-21 DIAGNOSIS — M545 Low back pain: Secondary | ICD-10-CM

## 2010-12-21 MED ORDER — METHYLPREDNISOLONE ACETATE 40 MG/ML INJ SUSP (RADIOLOG
120.0000 mg | Freq: Once | INTRAMUSCULAR | Status: AC
  Administered 2010-12-21: 120 mg via EPIDURAL

## 2010-12-21 MED ORDER — IOHEXOL 180 MG/ML  SOLN
1.0000 mL | Freq: Once | INTRAMUSCULAR | Status: AC | PRN
  Administered 2010-12-21: 1 mL via EPIDURAL

## 2011-01-04 ENCOUNTER — Ambulatory Visit (INDEPENDENT_AMBULATORY_CARE_PROVIDER_SITE_OTHER): Payer: Medicare Other | Admitting: Internal Medicine

## 2011-01-04 ENCOUNTER — Encounter: Payer: Self-pay | Admitting: Internal Medicine

## 2011-01-04 VITALS — BP 122/68 | HR 71 | Ht 62.5 in | Wt 137.6 lb

## 2011-01-04 DIAGNOSIS — Z23 Encounter for immunization: Secondary | ICD-10-CM

## 2011-01-04 DIAGNOSIS — J42 Unspecified chronic bronchitis: Secondary | ICD-10-CM

## 2011-01-04 MED ORDER — DOXYCYCLINE HYCLATE 100 MG PO TABS: 100.0000 mg | ORAL_TABLET | Freq: Two times a day (BID) | ORAL | Status: AC

## 2011-01-04 NOTE — Patient Instructions (Signed)
Script sent for doxycycline  Flu vax  Continue Mucinex and be sure to get plenty of fluid in to thin the mucus

## 2011-01-04 NOTE — Progress Notes (Signed)
Subjective:    Patient ID: Sheila Cisneros, female    DOB: Sep 22, 1940, 70 y.o.   MRN: [id number]  HPI HPI  07/31/10- 70 yoF followed for chronic bronchitis/ bronchiectasis and hx rhinosinusitis. Never smoker. Last here- January 30, 2010. She remained congested throough the winter, but she put up with it. No events needing medical visits. Pollen now makes cough somewhat more constant. Rarely productive. Denies fever or night sweats. Nebulizer still helps, used about twice daily. Denies  Chest pain or headache.   09/05/10- Chronic bronchitis, bronchiectasis, hx rhinosinusitis Reports increased chest congestion,producitve cough dark yellow.  Denies fever or sweat.She apparently didn't actually get a script for Daliresp last visit. We discussed side effects again. She is trying to exercise- water aerobics and walking.  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  Acute visit. Cough worse in past week. Was put on prednisone 2 weeks ago for hip- didn't help chest-  pred ended yesterday. Occasional hot flash but no sustained fever. Continues Daliresp.  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  She notices a little routine cough but it has been more productive with some darkening sputum and thicker mucus in the last week. No fever, sore throat or sweats. Sinuses feel normal. We had previously tried Daliresp she remembers it kept her awake at night. We talked about trying it again if necessary to suppress her repeated bronchitis.  Review of Systems   Constitutional:   No-   weight loss, night sweats, fevers, chills, fatigue, lassitude. HEENT:   No-  headaches, difficulty swallowing, tooth/dental problems, sore throat,       No-  sneezing, itching, ear ache, nasal congestion, post nasal drip,  CV:  No-   chest pain, orthopnea, PND, swelling in lower extremities,  anasarca, dizziness, palpitations Resp: No- acute  shortness of breath with exertion or at rest.              +  productive cough,  No non-productive cough,  No-  coughing up of blood.              + change in color of mucus.  No- wheezing.   Skin: No-   rash or lesions. GI:  No-   heartburn, indigestion, abdominal pain, nausea, vomiting, diarrhea,                 change in bowel habits, loss of appetite GU: No-   dysuria, change in color of urine, no urgency or frequency.  No- flank pain. MS:  No-   joint pain or swelling.  No- decreased range of motion.  No- back pain. Neuro- grossly normal to observation, Or:  Psych:  No- change in mood or affect. No depression or anxiety.  No memory loss.   Objective:   Physical Exam   General- Alert, Oriented, Affect-appropriate, Distress- none acute Skin- rash-none, lesions- none, excoriation- none Lymphadenopathy- none Head- atraumatic            Eyes- Gross vision intact, PERRLA, conjunctivae clear secretions            Ears- Hearing, canals-normal            Nose- Clear, no-Septal dev, mucus, polyps, erosion, perforation             Throat- Mallampati III , mucosa clear , drainage- none, tonsils- atrophic Neck- flexible , trachea midline, no stridor , thyroid nl,  carotid no bruit Chest - symmetrical excursion , unlabored           Heart/CV- RRR , no murmur , no gallop  , no rub, nl s1 s2                           - JVD- none , edema- none, stasis changes- none, varices- none           Lung- clear to P&A, wheeze- none, + deep loose cough , dullness-none, rub- none, unlabored; room air saturation 100%           Chest wall-  Abd- tender-no, distended-no, bowel sounds-present, HSM- no Br/ Gen/ Rectal- Not done, not indicated Extrem- cyanosis- none, clubbing, none, atrophy- none, strength- nl Neuro- grossly intact to observation

## 2011-01-07 NOTE — Assessment & Plan Note (Signed)
Increasing bronchitis. 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 Plan-doxycycline, Mucinex, increase fluids

## 2011-01-08 ENCOUNTER — Ambulatory Visit: Payer: Medicare Other | Admitting: Internal Medicine

## 2011-01-10 ENCOUNTER — Other Ambulatory Visit: Payer: Self-pay | Admitting: Orthopedic Surgery

## 2011-01-10 DIAGNOSIS — M549 Dorsalgia, unspecified: Secondary | ICD-10-CM

## 2011-01-11 ENCOUNTER — Ambulatory Visit
Admission: RE | Admit: 2011-01-11 | Discharge: 2011-01-11 | Disposition: A | Discharge status: Home / Self Care | Payer: Medicare Other | Source: Ambulatory Visit | Attending: Orthopedic Surgery | Admitting: Orthopedic Surgery

## 2011-01-11 DIAGNOSIS — M549 Dorsalgia, unspecified: Secondary | ICD-10-CM

## 2011-01-11 MED ORDER — METHYLPREDNISOLONE ACETATE 40 MG/ML INJ SUSP (RADIOLOG: 120.0000 mg | Freq: Once | INTRAMUSCULAR | Status: DC

## 2011-01-11 MED ORDER — IOHEXOL 180 MG/ML  SOLN: 1.0000 mL | Freq: Once | INTRAMUSCULAR | Status: AC | PRN

## 2011-01-15 ENCOUNTER — Ambulatory Visit (INDEPENDENT_AMBULATORY_CARE_PROVIDER_SITE_OTHER): Payer: Medicare Other | Admitting: Internal Medicine

## 2011-01-15 ENCOUNTER — Encounter: Payer: Self-pay | Admitting: Internal Medicine

## 2011-01-15 VITALS — BP 154/90 | HR 73 | Ht 62.2 in | Wt 141.8 lb

## 2011-01-15 DIAGNOSIS — J42 Unspecified chronic bronchitis: Secondary | ICD-10-CM

## 2011-01-15 DIAGNOSIS — J328 Other chronic sinusitis: Secondary | ICD-10-CM

## 2011-01-15 MED ORDER — MOXIFLOXACIN HCL 400 MG PO TABS: 400.0000 mg | ORAL_TABLET | Freq: Every day | ORAL | Status: DC

## 2011-01-15 NOTE — Patient Instructions (Addendum)
Neb albuterol  Script for antibiotic sent to drug store  Suggest mucinex and extra fluids to help thin the mucus  Consider another try Daliresp taking one half tablet every other day, after a meal.

## 2011-01-15 NOTE — Progress Notes (Signed)
Subjective:    Patient ID: Sheila Cisneros, female    DOB: 08-Oct-1940, 70 y.o.   MRN: [id number]  HPI HPI  07/31/10- 70 yoF followed for chronic bronchitis/ bronchiectasis and hx rhinosinusitis. Never smoker. Last here- January 30, 2010. She remained congested throough the winter, but she put up with it. No events needing medical visits. Pollen now makes cough somewhat more constant. Rarely productive. Denies fever or night sweats. Nebulizer still helps, used about twice daily. Denies  Chest pain or headache.   09/05/10- Chronic bronchitis, bronchiectasis, hx rhinosinusitis Reports increased chest congestion,producitve cough dark yellow.  Denies fever or sweat.She apparently didn't actually get a script for Daliresp last visit. We discussed side effects again. She is trying to exercise- water aerobics and walking.  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  Acute visit. Cough worse in past week. Was put on prednisone 2 weeks ago for hip- didn't help chest-  pred ended yesterday. Occasional hot flash but no sustained fever. Continues Daliresp.  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  She notices a little routine cough but it has been more productive with some darkening sputum and thicker mucus in the last week. No fever, sore throat or sweats. Sinuses feel normal. We had previously tried [employer]- she remembers it kept her awake at night. We talked about trying it again if necessary to suppress her repeated bronchitis.  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  She comes for an acute visit. She has been around a lot of family recently. 2 days ago she thinks she went outdoors too quickly after her bath and may have gotten chills. Next on waking, she noted sore throat no fever no  stomach upset. No sinus congestion but sniffing a lot. Mainly complains of chest congestion and rattle cough productive of thick yellow sputum which has been sometimes brown. She started herself on doxycycline. We discussed her earlier experience with Daliresp. She never took it, although she bought it, because she disliked the printed side effect list. I went over those and my own experience with this drug again today. Has had flu shot this year.  Review of Systems   Constitutional:   No-   weight loss, night sweats, fevers, chills, fatigue, lassitude. HEENT:   No-  headaches, difficulty swallowing, tooth/dental problems, sore throat,       No-  sneezing, itching, ear ache, nasal congestion, post nasal drip,  CV:  No-   chest pain, orthopnea, PND, swelling in lower extremities, anasarca, dizziness, palpitations Resp: No- acute  shortness of breath with exertion or at rest.              +  productive cough,  No non-productive cough,  No-  coughing up of blood.              + change in color of mucus.  No- wheezing.   Skin: No-   rash or lesions. GI:  No-   heartburn, indigestion, abdominal pain, nausea, vomiting, diarrhea,                 change in bowel habits, loss of appetite GU: No-   dysuria, change in color of urine, no urgency or frequency.  No- flank pain. MS:  No-   joint pain or swelling.  No- decreased range of motion.  No- back  pain. Neuro- grossly normal to observation, Or:  Psych:  No- change in mood or affect. No depression or anxiety.  No memory loss.   Objective:   Physical Exam   General- Alert, Oriented, Affect-appropriate, Distress- none acute Skin- rash-none, lesions- none, excoriation- none Lymphadenopathy- none Head- atraumatic            Eyes- Gross vision intact, PERRLA, conjunctivae clear secretions            Ears- Hearing, canals-normal            Nose- Clear, no-Septal dev, mucus, polyps, erosion, perforation, but sniffing.            Throat- Mallampati III ,  mucosa clear , drainage- none, tonsils- atrophic Neck- flexible , trachea midline, no stridor , thyroid nl, carotid no bruit Chest - symmetrical excursion , unlabored           Heart/CV- RRR , no murmur , no gallop  , no rub, nl s1 s2                           - JVD- none , edema- none, stasis changes- none, varices- none           Lung- wheeze and rhonchi bilaterally, + deep loose cough , dullness-none, rub- none, unlabored; room air saturation 97%           Chest wall-  Abd- tender-no, distended-no, bowel sounds-present, HSM- no Br/ Gen/ Rectal- Not done, not indicated Extrem- cyanosis- none, clubbing, none, atrophy- none, strength- nl Neuro- grossly intact to observation

## 2011-01-16 NOTE — Assessment & Plan Note (Signed)
So far this problem has not flared with the acute illness but potential is being watched.

## 2011-01-16 NOTE — Assessment & Plan Note (Signed)
Exacerbation of chronic bronchitis. Since she has been around a lot of people, most likely this is viral triggered although the exam is nonspecific and she has a significant long term chronic bronchitis which may become more purulent quickly. Plan-Avelox, nebulizer treatment, Mucinex, consider another try at [facility] at very small doses.

## 2011-01-30 LAB — POCT URINALYSIS DIP (DEVICE)
Glucose, UA: NEGATIVE
Glucose, UA: NEGATIVE
Hgb urine dipstick: NEGATIVE
Ketones, ur: 15 — AB
Ketones, ur: NEGATIVE
Nitrite: NEGATIVE
Operator id: 247071
Operator id: 270961
Protein, ur: 100 — AB
Specific Gravity, Urine: 1.02
Specific Gravity, Urine: 1.025
Urobilinogen, UA: 0.2
Urobilinogen, UA: 0.2
pH: 7

## 2011-01-30 LAB — I-STAT 8, (EC8 V) (CONVERTED LAB)
Acid-Base Excess: 7 — ABNORMAL HIGH
BUN: 11
Bicarbonate: 31.7 — ABNORMAL HIGH
Bicarbonate: 32.5 — ABNORMAL HIGH
Chloride: 98
Glucose, Bld: 106 — ABNORMAL HIGH
Glucose, Bld: 111 — ABNORMAL HIGH
HCT: 43
HCT: 45
Hemoglobin: 14.6
Operator id: 247071
Operator id: 270961
Potassium: 3.1 — ABNORMAL LOW
Potassium: 3.4 — ABNORMAL LOW
Sodium: 133 — ABNORMAL LOW
Sodium: 135
TCO2: 33
TCO2: 34
pCO2, Ven: 46.4
pH, Ven: 7.454 — ABNORMAL HIGH

## 2011-01-30 LAB — HEPATIC FUNCTION PANEL
ALT: 16
AST: 15
Albumin: 3.1 — ABNORMAL LOW
Alkaline Phosphatase: 102
Bilirubin, Direct: 0.1
Indirect Bilirubin: 0.9
Total Bilirubin: 1
Total Protein: 7.1

## 2011-01-30 LAB — CBC
HCT: 38.7
HCT: 41
Hemoglobin: 12.9
Hemoglobin: 13.8
MCHC: 33.4
MCHC: 33.6
MCV: 91.2
MCV: 91.4
Platelets: 311
Platelets: 332
RBC: 4.25
RBC: 4.49
RDW: 13.4
RDW: 13.7
WBC: 10.3
WBC: 11.1 — ABNORMAL HIGH

## 2011-01-30 LAB — DIFFERENTIAL
Basophils Absolute: 0
Basophils Absolute: 0.1
Basophils Relative: 0
Basophils Relative: 1
Eosinophils Absolute: 0
Eosinophils Absolute: 0.1
Eosinophils Relative: 0
Eosinophils Relative: 1
Lymphocytes Relative: 32
Lymphocytes Relative: 32
Lymphs Abs: 3.3
Lymphs Abs: 3.6 — ABNORMAL HIGH
Monocytes Absolute: 0.6
Monocytes Absolute: 0.7
Monocytes Relative: 6
Monocytes Relative: 7
Neutro Abs: 6.3
Neutro Abs: 6.7
Neutrophils Relative %: 61
Neutrophils Relative %: 61

## 2011-01-30 LAB — URINALYSIS, ROUTINE W REFLEX MICROSCOPIC
Glucose, UA: NEGATIVE
Hgb urine dipstick: NEGATIVE
Specific Gravity, Urine: 1.017
Urobilinogen, UA: 0.2

## 2011-01-30 LAB — LIPASE, BLOOD: Lipase: <10 — ABNORMAL LOW

## 2011-01-30 LAB — POCT I-STAT CREATININE
Creatinine, Ser: 0.9
Operator id: 247071

## 2011-01-30 LAB — URINE MICROSCOPIC-ADD ON

## 2011-02-26 ENCOUNTER — Ambulatory Visit (INDEPENDENT_AMBULATORY_CARE_PROVIDER_SITE_OTHER): Payer: Medicare Other | Admitting: Internal Medicine

## 2011-02-26 ENCOUNTER — Other Ambulatory Visit: Payer: Medicare Other

## 2011-02-26 ENCOUNTER — Encounter: Payer: Self-pay | Admitting: Internal Medicine

## 2011-02-26 DIAGNOSIS — J42 Unspecified chronic bronchitis: Secondary | ICD-10-CM

## 2011-02-26 MED ORDER — ACETYLCYSTEINE 10 % IN SOLN: 4.0000 mL | Freq: Two times a day (BID) | RESPIRATORY_TRACT | Status: DC

## 2011-02-26 MED ORDER — ACETYLCYSTEINE 20 % IN SOLN: 4.0000 mL | Freq: Two times a day (BID) | RESPIRATORY_TRACT | Status: DC

## 2011-02-26 NOTE — Progress Notes (Signed)
Subjective:    Patient ID: Sheila Cisneros, female    DOB: 1940/08/29, 70 y.o.   MRN: [id number]  HPI HPI  07/31/10- 70 yoF followed for chronic bronchitis/ bronchiectasis and hx rhinosinusitis. Never smoker. Last here- January 30, 2010. She remained congested throough the winter, but she put up with it. No events needing medical visits. Pollen now makes cough somewhat more constant. Rarely productive. Denies fever or night sweats. Nebulizer still helps, used about twice daily. Denies  Chest pain or headache.   09/05/10- Chronic bronchitis, bronchiectasis, hx rhinosinusitis Reports increased chest congestion,producitve cough dark yellow.  Denies fever or sweat.She apparently didn't actually get a script for Daliresp last visit. We discussed side effects again. She is trying to exercise- water aerobics and walking.  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  Acute visit. Cough worse in past week. Was put on prednisone 2 weeks ago for hip- didn't help chest-  pred ended yesterday. Occasional hot flash but no sustained fever. Continues Daliresp.  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  She notices a little routine cough but it has been more productive with some darkening sputum and thicker mucus in the last week. No fever, sore throat or sweats. Sinuses feel normal. We had previously tried [employer]- she remembers it kept her awake at night. We talked about trying it again if necessary to suppress her repeated bronchitis.  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  She comes for an acute visit. She has been around a lot of family recently. 2 days ago she thinks she went outdoors too quickly after her bath and may have gotten chills. Next on waking, she noted sore throat no fever no  stomach upset. No sinus congestion but sniffing a lot. Mainly complains of chest congestion and rattle cough productive of thick yellow sputum which has been sometimes brown. She started herself on doxycycline. We discussed her earlier experience with Daliresp. She never took it, although she bought it, because she disliked the printed side effect list. I went over those and my own experience with this drug again today. Has had flu shot this year.  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  Has had flu vaccine. After last visit she tried Daliresp but found it too expensive. She keeps her persistent deep chest rattle and coughs scant gray sputum. Does not really feel badly and is able to exercise. Does sometimes cough until she retches. Does not recognize reflux or sinus pain/drainage. She is using her Flutter device twice daily for pulmonary toilet.  Review of Systems-see HPI   Constitutional:   No-   weight loss, night sweats, fevers, chills, fatigue, lassitude. HEENT:   No-  headaches, difficulty swallowing, tooth/dental problems, sore throat,       No-  sneezing, itching, ear ache, nasal congestion, post nasal drip,  CV:  No-   chest pain, orthopnea, PND, swelling in lower extremities, anasarca, dizziness, palpitations Resp: No- acute  shortness of breath with exertion or at rest.              +  productive cough,  + non-productive cough,  No-  coughing up of blood.              + change in color of mucus.  No- wheezing.  Skin: No-   rash or lesions. GI:  No-   heartburn, indigestion, abdominal pain, nausea, vomiting, diarrhea,                 change in bowel habits, loss of appetite GU: No-   dysuria, change in color of urine, no urgency or frequency.  No- flank pain. MS:  No-   joint pain or swelling.  No- decreased range of motion.  No- back pain. Neuro- grossly normal to observation, Or:  Psych:  No- change in  mood or affect. No depression or anxiety.  No memory loss.   Objective:   Physical Exam   General- Alert, Oriented, Affect-appropriate, Distress- none acute Skin- rash-none, lesions- none, excoriation- none Lymphadenopathy- none Head- atraumatic            Eyes- Gross vision intact, PERRLA, conjunctivae clear secretions            Ears- Hearing, canals-normal            Nose- Clear, no-Septal dev, mucus, polyps, erosion, perforation, but sniffing.            Throat- Mallampati III , mucosa clear , drainage- none, tonsils- atrophic Neck- flexible , trachea midline, no stridor , thyroid nl, carotid no bruit Chest - symmetrical excursion , unlabored           Heart/CV- RRR , no murmur , no gallop  , no rub, nl s1 s2                           - JVD- none , edema- none, stasis changes- none, varices- none           Lung-  coarse rhonchi bilaterally, + deep loose cough , dullness-none, rub- none, unlabored; room air saturation 99%           Chest wall-  Abd- tender-no, distended-no, bowel sounds-present, HSM- no Br/ Gen/ Rectal- Not done, not indicated Extrem- cyanosis- none, clubbing, none, atrophy- none, strength- nl Neuro- grossly intact to observation

## 2011-02-26 NOTE — Patient Instructions (Signed)
Script mucomyst. n-acetyl cysteine- to thin mucus. Try adding to your nebulizer med, and using twice daily for 3-4 days at a time as needed.   Order- lab- routine sputum culture- c&s   Dx chronic bronchitis

## 2011-02-28 NOTE — Assessment & Plan Note (Signed)
Sustained chronic bronchitis with less of a sinus disease component and no easily recognized external triggers. I had hoped Daliresp would reduce inflammation but she can't afford it. We may be able to get Mucomyst for nebulizer use to thin mucus occasionally, if it is actually available now. We will re culture her sputum.

## 2011-03-01 LAB — RESPIRATORY CULTURE OR RESPIRATORY AND SPUTUM CULTURE

## 2011-03-16 ENCOUNTER — Telehealth: Payer: Self-pay | Admitting: Internal Medicine

## 2011-03-16 MED ORDER — SULFAMETHOXAZOLE-TMP DS 800-160 MG PO TABS: 1.0000 | ORAL_TABLET | Freq: Two times a day (BID) | ORAL | Status: AC

## 2011-03-16 NOTE — Progress Notes (Signed)
Quick Note:  Pt aware of results and Rx sent. ______

## 2011-03-16 NOTE — Telephone Encounter (Signed)
Pt aware of results and rx sent.

## 2011-03-16 NOTE — Progress Notes (Signed)
Quick Note:  LMTCB again. ______

## 2011-03-31 ENCOUNTER — Other Ambulatory Visit: Payer: Self-pay | Admitting: Internal Medicine

## 2011-04-05 ENCOUNTER — Ambulatory Visit (INDEPENDENT_AMBULATORY_CARE_PROVIDER_SITE_OTHER): Payer: Medicare Other | Admitting: Internal Medicine

## 2011-04-05 ENCOUNTER — Ambulatory Visit (INDEPENDENT_AMBULATORY_CARE_PROVIDER_SITE_OTHER)
Admission: RE | Admit: 2011-04-05 | Discharge: 2011-04-05 | Disposition: A | Discharge status: Home / Self Care | Payer: Medicare Other | Source: Ambulatory Visit | Attending: Internal Medicine | Admitting: Internal Medicine

## 2011-04-05 ENCOUNTER — Ambulatory Visit: Payer: Medicare Other | Admitting: Internal Medicine

## 2011-04-05 ENCOUNTER — Encounter: Payer: Self-pay | Admitting: Internal Medicine

## 2011-04-05 DIAGNOSIS — J42 Unspecified chronic bronchitis: Secondary | ICD-10-CM

## 2011-04-05 MED ORDER — CEFUROXIME AXETIL 500 MG PO TABS: 500.0000 mg | ORAL_TABLET | Freq: Two times a day (BID) | ORAL | Status: DC

## 2011-04-05 NOTE — Patient Instructions (Signed)
Script ceftin antibiotic sent   Order CXR- chronic bronchitis, compare with CT 08/2010

## 2011-04-05 NOTE — Progress Notes (Signed)
Patient ID: Sheila Cisneros, female    DOB: [birth year], 70 y.o.   MRN: [id number] HPI  07/31/10- 70 yoF followed for chronic bronchitis/ bronchiectasis and hx rhinosinusitis. Never smoker. Last here- January 30, 2010. She remained congested throough the winter, but she put up with it. No events needing medical visits. Pollen now makes cough somewhat more constant. Rarely productive. Denies fever or night sweats. Nebulizer still helps, used about twice daily. Denies  Chest pain or headache.   09/05/10- Chronic bronchitis, bronchiectasis, hx rhinosinusitis Reports increased chest congestion,producitve cough dark yellow.  Denies fever or sweat.She apparently didn't actually get a script for Daliresp last visit. We discussed side effects again. She is trying to exercise- water aerobics and walking.  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  Acute visit. Cough worse in past week. Was put on prednisone 2 weeks ago for hip- didn't help chest-  pred ended yesterday. Occasional hot flash but no sustained fever. Continues Daliresp.  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  She notices a little routine cough but it has been more productive with some darkening sputum and thicker mucus in the last week. No fever, sore throat or sweats. Sinuses feel normal. We had previously tried [employer]- she remembers it kept her awake at night. We talked about trying it again if necessary to suppress her repeated bronchitis.  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  She comes for an acute visit. She has been around a lot of family recently. 2 days ago she thinks she went outdoors too quickly after her bath and may have gotten chills. Next on waking, she noted sore throat no fever no stomach upset. No  sinus congestion but sniffing a lot. Mainly complains of chest congestion and rattle cough productive of thick yellow sputum which has been sometimes brown. She started herself on doxycycline. We discussed her earlier experience with Daliresp. She never took it, although she bought it, because she disliked the printed side effect list. I went over those and my own experience with this drug again today. Has had flu shot this year.  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  Has had flu vaccine. After last visit she tried Daliresp but found it too expensive. She keeps her persistent deep chest rattle and coughs scant gray sputum. Does not really feel badly and is able to exercise. Does sometimes cough until she retches. Does not recognize reflux or sinus pain/drainage. She is using her Flutter device twice daily for pulmonary toilet.  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  CT 08/25/10-  1. Bilateral cylindrical type atelectasis within the lower lobes.  2. Bilateral lower lobe airspace densities, likely the sequela of  inflammation or infection. Suggest short-term follow-up  examination to ensure resolution.  3. Scattered small nodules within the right upper lobe are also  likely related to inflammation or infection. A short-term follow-  up examination in 3 months is recommended to ensure resolution.  Original Report Authenticated By: Rosealee AlbeeAYLOR H. STROUD, M.D.  Had flu vax. Mucomyst is not available - prescribed last visit.  Felt well after last here until took chill first of this week. Sore throat, increased productive cough and congestion. Head stuffy, no HA. Sputum cx  02/26/11- grew E. Coli. Septra was called in and helped, after refill she has now been off x 1 week.  Review of Systems-see HPI   Constitutional:   No-   weight loss, night sweats,  fevers, +chills, fatigue, lassitude. HEENT:   No-  headaches, difficulty swallowing, tooth/dental problems, sore throat,       No-  sneezing, itching, ear ache, nasal congestion, post nasal drip,  CV:  No-   chest pain, orthopnea, PND, swelling in lower extremities, anasarca, dizziness, palpitations Resp: No- acute  shortness of breath with exertion or at rest.              +  productive cough,  + non-productive cough,  No-  coughing up of blood.              + change in color of mucus.  No- wheezing.   Skin: No-   rash or lesions. GI:  No-   heartburn, indigestion, abdominal pain, nausea, vomiting, diarrhea,                 change in bowel habits, loss of appetite GU: No-   dysuria, change in color of urine, no urgency or frequency.  No- flank pain. MS:  No-   joint pain or swelling.  No- decreased range of motion.  No- back pain. Neuro- grossly normal to observation, Or:  Psych:  No- change in mood or affect. No depression or anxiety.  No memory loss.   Objective:   Physical Exam   General- Alert, Oriented, Affect-appropriate, Distress- none acute Skin- rash-none, lesions- none, excoriation- none Lymphadenopathy- none Head- atraumatic            Eyes- Gross vision intact, PERRLA, conjunctivae clear secretions            Ears- Hearing, canals-normal            Nose- Clear, no-Septal dev, mucus, polyps, erosion, perforation, but sniffing.            Throat- Mallampati III , mucosa clear , drainage- none, tonsils- atrophic Neck- flexible , trachea midline, no stridor , thyroid nl, carotid no bruit Chest - symmetrical excursion , unlabored           Heart/CV- RRR , no murmur , no gallop  , no rub, nl s1 s2                           - JVD- none , edema- none, stasis changes- none, varices- none           Lung- deeep rattle, frequent sniffing, dullness-none, rub- none, unlabored; room air saturation 99%           Chest wall-  Abd- tender-no, distended-no, bowel sounds-present, HSM-  no Br/ Gen/ Rectal- Not done, not indicated Extrem- cyanosis- none, clubbing, none, atrophy- none, strength- nl Neuro- grossly intact to observation

## 2011-04-08 ENCOUNTER — Encounter: Payer: Self-pay | Admitting: Internal Medicine

## 2011-04-08 NOTE — Assessment & Plan Note (Signed)
Not sure why she is culturing E.coli, unless she has been aspirating more than she realizes, assuming airway E.coli explains recent symptoms.

## 2011-04-09 ENCOUNTER — Telehealth: Payer: Self-pay | Admitting: Internal Medicine

## 2011-04-09 MED ORDER — BENZONATATE 200 MG PO CAPS: 200.0000 mg | ORAL_CAPSULE | Freq: Two times a day (BID) | ORAL | Status: DC | PRN

## 2011-04-09 NOTE — Telephone Encounter (Signed)
Per CDY, okay to send refill for 1 month supply and pt is aware.

## 2011-04-09 NOTE — Telephone Encounter (Signed)
Please advise if okay to refill bactrim ds for pt Dr. Maple Hudson, thanks

## 2011-04-09 NOTE — Progress Notes (Signed)
Quick Note:  Spoke with pt and notified of results per Dr. Young. Pt verbalized understanding and denied any questions.  ______ 

## 2011-05-16 ENCOUNTER — Other Ambulatory Visit (HOSPITAL_COMMUNITY): Payer: Self-pay | Admitting: Internal Medicine

## 2011-05-16 DIAGNOSIS — Z1231 Encounter for screening mammogram for malignant neoplasm of breast: Secondary | ICD-10-CM

## 2011-05-28 ENCOUNTER — Encounter: Payer: Self-pay | Admitting: Internal Medicine

## 2011-05-28 ENCOUNTER — Ambulatory Visit (INDEPENDENT_AMBULATORY_CARE_PROVIDER_SITE_OTHER): Payer: Medicare Other | Admitting: Internal Medicine

## 2011-05-28 VITALS — BP 150/72 | HR 74 | Ht 62.2 in | Wt 134.6 lb

## 2011-05-28 DIAGNOSIS — J42 Unspecified chronic bronchitis: Secondary | ICD-10-CM

## 2011-05-28 DIAGNOSIS — J328 Other chronic sinusitis: Secondary | ICD-10-CM

## 2011-05-28 MED ORDER — CEFUROXIME AXETIL 500 MG PO TABS: 500.0000 mg | ORAL_TABLET | Freq: Two times a day (BID) | ORAL | Status: AC

## 2011-05-28 NOTE — Patient Instructions (Signed)
Print script to hold for ceftin antibiotic to use when needed for bronchitis

## 2011-05-28 NOTE — Progress Notes (Signed)
Patient ID: Sheila Cisneros, female    DOB: [birth year], 71 y.o.   MRN: [id number] HPI  07/31/10- 70 yoF followed for chronic bronchitis/ bronchiectasis and hx rhinosinusitis. Never smoker. Last here- January 30, 2010. She remained congested throough the winter, but she put up with it. No events needing medical visits. Pollen now makes cough somewhat more constant. Rarely productive. Denies fever or night sweats. Nebulizer still helps, used about twice daily. Denies  Chest pain or headache.   09/05/10- Chronic bronchitis, bronchiectasis, hx rhinosinusitis Reports increased chest congestion,producitve cough dark yellow.  Denies fever or sweat.She apparently didn't actually get a script for Daliresp last visit. We discussed side effects again. She is trying to exercise- water aerobics and walking.  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  Acute visit. Cough worse in past week. Was put on prednisone 2 weeks ago for hip- didn't help chest-  pred ended yesterday. Occasional hot flash but no sustained fever. Continues Daliresp.  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  She notices a little routine cough but it has been more productive with some darkening sputum and thicker mucus in the last week. No fever, sore throat or sweats. Sinuses feel normal. We had previously tried [employer]- she remembers it kept her awake at night. We talked about trying it again if necessary to suppress her repeated bronchitis.  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  She comes for an acute visit. She has been around a lot of family recently. 2 days ago she thinks she went outdoors too quickly after her bath and may have gotten chills. Next on waking, she noted sore throat no fever no stomach upset. No  sinus congestion but sniffing a lot. Mainly complains of chest congestion and rattle cough productive of thick yellow sputum which has been sometimes brown. She started herself on doxycycline. We discussed her earlier experience with Daliresp. She never took it, although she bought it, because she disliked the printed side effect list. I went over those and my own experience with this drug again today. Has had flu shot this year.  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  Has had flu vaccine. After last visit she tried Daliresp but found it too expensive. She keeps her persistent deep chest rattle and coughs scant gray sputum. Does not really feel badly and is able to exercise. Does sometimes cough until she retches. Does not recognize reflux or sinus pain/drainage. She is using her Flutter device twice daily for pulmonary toilet.  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  CT 08/25/10-  1. Bilateral cylindrical type atelectasis within the lower lobes.  2. Bilateral lower lobe airspace densities, likely the sequela of  inflammation or infection. Suggest short-term follow-up  examination to ensure resolution.  3. Scattered small nodules within the right upper lobe are also  likely related to inflammation or infection. A short-term follow-  up examination in 3 months is recommended to ensure resolution.  Original Report Authenticated By: Rosealee AlbeeAYLOR H. STROUD, M.D.  Had flu vax. Mucomyst is not available - prescribed last visit.  Felt well after last here until took chill first of this week. Sore throat, increased productive cough and congestion. Head stuffy, no HA. Sputum cx  02/26/11- grew E. Coli. Septra was called in and helped, after refill she has now been off x 1 week.  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  At last office visit she stopped Daliresp as ineffective. We had given Ceftin for Escherichia coli on sputum culture. She admits cough has been better but remains productive of yellow sputum off and on. Denies chest pain, fever, chills, enlarged nodes.  Review of Systems-see HPI   Constitutional:   No-   weight loss, night sweats, fevers, +chills, fatigue, lassitude. HEENT:   No-  headaches, difficulty swallowing, tooth/dental problems, sore throat,       No-  sneezing, itching, ear ache, nasal congestion, post nasal drip,  CV:  No-   chest pain, orthopnea, PND, swelling in lower extremities, anasarca, dizziness, palpitations Resp: No- acute  shortness of breath with exertion or at rest.              +  productive cough,  + non-productive cough,  No-  coughing up of blood.              + change in color of mucus.  No- wheezing.   Skin: No-   rash or lesions. GI:  No-   heartburn, indigestion, abdominal pain, nausea, vomiting, diarrhea,                 change in bowel habits, loss of appetite GU: MS:  No-   joint pain or swelling.  No- decreased range of motion.  No- back pain. Neuro- grossly normal to observation, Or:  Psych:  No- change in mood or affect. No depression or anxiety.  No memory loss.   Objective:   Physical Exam   General- Alert, Oriented, Affect-appropriate, Distress- none acute Skin- rash-none, lesions- none, excoriation- none Lymphadenopathy- none Head- atraumatic            Eyes- Gross vision intact, PERRLA, conjunctivae clear secretions            Ears- Hearing, canals-normal            Nose- Clear, no-Septal dev, mucus, polyps, erosion, perforation, but sniffing.            Throat- Mallampati III , mucosa clear , drainage- none, tonsils- atrophic Neck- flexible , trachea midline, no stridor , thyroid nl, carotid no bruit Chest - symmetrical excursion , unlabored           Heart/CV- RRR , no murmur , no gallop  , no rub, nl s1  s2                           - JVD- none , edema- none, stasis changes- none, varices- none           Lung-  Mild but deep rattle cough, frequent sniffing, dullness-none, rub- none, unlabored; room air saturation 97%           Chest wall-  Abd-  Br/ Gen/ Rectal- Not done, not indicated Extrem- cyanosis- none, clubbing, none, atrophy- none, strength- nl Neuro- grossly intact to observation

## 2011-05-30 NOTE — Assessment & Plan Note (Signed)
Currently well controlled.  

## 2011-05-30 NOTE — Assessment & Plan Note (Signed)
She improved on ceftin. GNR sputum cx suggests GI contamination / reflux not recognized clinically. Plan- Script to hold for repeat round of Ceftin as needed.

## 2011-06-13 ENCOUNTER — Ambulatory Visit (HOSPITAL_COMMUNITY)
Admission: RE | Admit: 2011-06-13 | Discharge: 2011-06-13 | Disposition: A | Discharge status: Home / Self Care | Payer: Medicare Other | Source: Ambulatory Visit | Attending: Internal Medicine | Admitting: Internal Medicine

## 2011-06-13 DIAGNOSIS — Z1231 Encounter for screening mammogram for malignant neoplasm of breast: Secondary | ICD-10-CM | POA: Insufficient documentation

## 2011-06-25 ENCOUNTER — Other Ambulatory Visit: Payer: Self-pay | Admitting: *Deleted

## 2011-06-25 ENCOUNTER — Telehealth: Payer: Self-pay | Admitting: Internal Medicine

## 2011-06-25 MED ORDER — CIPROFLOXACIN HCL 500 MG PO TABS: 500.0000 mg | ORAL_TABLET | Freq: Two times a day (BID) | ORAL | Status: AC

## 2011-06-25 NOTE — Telephone Encounter (Signed)
Pt returned triage's call.  Sheila Cisneros ° °

## 2011-06-25 NOTE — Telephone Encounter (Signed)
rx sent. Pt aware.[name], CMA  

## 2011-06-25 NOTE — Telephone Encounter (Signed)
Per CY-okay to give Cipro 500 mg #14 take 1 po bid no refills.

## 2011-06-25 NOTE — Telephone Encounter (Signed)
LMTCB

## 2011-06-25 NOTE — Telephone Encounter (Signed)
Called, spoke with pt. She c/o "terrible" cough, PND, and chest congestion x 1 wk.  Cough is prod with gray to yellow mucus.  Denies increased SOB, wheezing, chest tightness/pain, or fever.  Is taking mucinex liquid bid and tussion cough syrup tid with not much relief.  Ok with rxs being called in.  Dr. Maple Hudson, pls advise.  Thank you.    [facility] [address]  Allergies verified  Allergies  Allergen Reactions  . Penicillins Itching and Rash    REACTION: rash  . Tetanus Toxoids Rash  . Levofloxacin   . Codeine Rash    REACTION: angioedema

## 2011-08-22 ENCOUNTER — Encounter (HOSPITAL_COMMUNITY): Payer: Self-pay | Admitting: Pharmacy Technician

## 2011-08-27 ENCOUNTER — Ambulatory Visit (INDEPENDENT_AMBULATORY_CARE_PROVIDER_SITE_OTHER): Payer: Medicare Other | Admitting: Internal Medicine

## 2011-08-27 ENCOUNTER — Encounter: Payer: Self-pay | Admitting: Internal Medicine

## 2011-08-27 VITALS — BP 122/84 | HR 73 | Ht 60.75 in | Wt 134.8 lb

## 2011-08-27 DIAGNOSIS — J42 Unspecified chronic bronchitis: Secondary | ICD-10-CM

## 2011-08-27 DIAGNOSIS — J328 Other chronic sinusitis: Secondary | ICD-10-CM

## 2011-08-27 NOTE — Patient Instructions (Signed)
Please call as needed 

## 2011-08-27 NOTE — Progress Notes (Signed)
Patient ID: Sheila Cisneros, female    DOB: [birth year], 71 y.o.   MRN: [id number] HPI  07/31/10- 71 yoF followed for chronic bronchitis/ bronchiectasis and hx rhinosinusitis. Never smoker. Last here- January 30, 2010. She remained congested throough the winter, but she put up with it. No events needing medical visits. Pollen now makes cough somewhat more constant. Rarely productive. Denies fever or night sweats. Nebulizer still helps, used about twice daily. Denies  Chest pain or headache.   09/05/10- Chronic bronchitis, bronchiectasis, hx rhinosinusitis Reports increased chest congestion,producitve cough dark yellow.  Denies fever or sweat.She apparently didn't actually get a script for Daliresp last visit. We discussed side effects again. She is trying to exercise- water aerobics and walking.  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  Acute visit. Cough worse in past week. Was put on prednisone 2 weeks ago for hip- didn't help chest-  pred ended yesterday. Occasional hot flash but no sustained fever. Continues Daliresp.  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  She notices a little routine cough but it has been more productive with some darkening sputum and thicker mucus in the last week. No fever, sore throat or sweats. Sinuses feel normal. We had previously tried [employer]- she remembers it kept her awake at night. We talked about trying it again if necessary to suppress her repeated bronchitis.  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  She comes for an acute visit. She has been around a lot of family recently. 2 days ago she thinks she went outdoors too quickly after her bath and may have gotten chills. Next on waking, she noted sore throat no fever no stomach upset. No  sinus congestion but sniffing a lot. Mainly complains of chest congestion and rattle cough productive of thick yellow sputum which has been sometimes brown. She started herself on doxycycline. We discussed her earlier experience with Daliresp. She never took it, although she bought it, because she disliked the printed side effect list. I went over those and my own experience with this drug again today. Has had flu shot this year.  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  Has had flu vaccine. After last visit she tried Daliresp but found it too expensive. She keeps her persistent deep chest rattle and coughs scant gray sputum. Does not really feel badly and is able to exercise. Does sometimes cough until she retches. Does not recognize reflux or sinus pain/drainage. She is using her Flutter device twice daily for pulmonary toilet.  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  CT 08/25/10-  1. Bilateral cylindrical type atelectasis within the lower lobes.  2. Bilateral lower lobe airspace densities, likely the sequela of  inflammation or infection. Suggest short-term follow-up  examination to ensure resolution.  3. Scattered small nodules within the right upper lobe are also  likely related to inflammation or infection. A short-term follow-  up examination in 3 months is recommended to ensure resolution.  Original Report Authenticated By: Rosealee AlbeeAYLOR H. STROUD, M.D.  Had flu vax. Mucomyst is not available - prescribed last visit.  Felt well after last here until took chill first of this week. Sore throat, increased productive cough and congestion. Head stuffy, no HA. Sputum cx  02/26/11- grew E. Coli. Septra was called in and helped, after refill she has now been off x 1 week.  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  At last office visit she stopped Daliresp as ineffective. We had given Ceftin for Escherichia coli on sputum culture. She admits cough has been better but remains productive of yellow sputum off and on. Denies chest pain, fever, chills, enlarged nodes.  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 Recently had flare up last week due to pollen but has gotten better now. She noted mostly nasal stuffiness. Not much cough or wheeze and no recent need for antibiotics.  Review of Systems-see HPI   Constitutional:   No-   weight loss, night sweats, fevers, +chills, fatigue, lassitude. HEENT:   No-  headaches, difficulty swallowing, tooth/dental problems, sore throat,       No-  sneezing, itching, ear ache, nasal congestion, post nasal drip,  CV:  No-   chest pain, orthopnea, PND, swelling in lower extremities, anasarca, dizziness, palpitations Resp: No- acute  shortness of breath with exertion or at rest.              +  productive cough,  + non-productive cough,  No-  coughing up of blood.              + change in color of mucus.  No- wheezing.   Skin: No-   rash or lesions. GI:  No-   heartburn, indigestion, abdominal pain, nausea, vomiting, GU: MS:  No-   joint pain or swelling.   Neuro- nothing unusual Psych:  No- change in mood or affect. No depression or anxiety.  No memory loss.   Objective:   Physical Exam   General- Alert, Oriented, Affect-appropriate, Distress- none acute Skin- rash-none, lesions- none, excoriation- none Lymphadenopathy- none Head- atraumatic            Eyes- Gross vision intact, PERRLA, conjunctivae clear secretions            Ears- Hearing, canals-normal            Nose- Clear, no-Septal dev, mucus, polyps, erosion, perforation, +frequent sniffing.            Throat- Mallampati III , mucosa clear , drainage- none, tonsils- atrophic Neck- flexible ,  trachea midline, no stridor , thyroid nl, carotid no bruit Chest - symmetrical excursion , unlabored           Heart/CV- RRR , no murmur , no gallop  , no rub, nl s1 s2                           - JVD- none , edema- none, stasis changes- none, varices- none           Lung-  diminished with trace crackles,  dullness-none, rub- none, unlabored; room air saturation 97%           Chest wall-  Abd-  Br/ Gen/ Rectal- Not done, not indicated Extrem- cyanosis- none, clubbing, none, atrophy- none, strength- nl Neuro- grossly intact to observation

## 2011-08-28 ENCOUNTER — Encounter (HOSPITAL_COMMUNITY): Payer: Self-pay

## 2011-08-28 ENCOUNTER — Inpatient Hospital Stay (HOSPITAL_COMMUNITY): Admission: RE | Admit: 2011-08-28 | Discharge: 2011-08-28 | Payer: Medicare Other | Source: Ambulatory Visit

## 2011-08-28 ENCOUNTER — Other Ambulatory Visit: Payer: Self-pay | Admitting: Orthopedic Surgery

## 2011-08-28 HISTORY — DX: Gastro-esophageal reflux disease without esophagitis: K21.9

## 2011-08-28 NOTE — Pre-Procedure Instructions (Signed)
20 Sheila Cisneros  08/28/2011   Your procedure is scheduled on:  Thursday Sep 06, 2011.  Report to [facility] at 0530 AM.  Call this number if you have problems the morning of surgery: [phone]   Remember:   Do not eat food:After Midnight.  May have clear liquids: up to 4 Hours before arrival until 0130 am.  Clear liquids include soda, tea, black coffee, apple or grape juice, broth.  Take these medicines the morning of surgery with A SIP OF WATER: Albuterol inhaler and Nebulizer if needed for shortness of breath, and Hydrocodone (Vicodin) if needed for pain.    Do not wear jewelry, make-up or nail polish.  Do not wear lotions, powders, or perfumes. You may wear deodorant.  Do not shave 48 hours prior to surgery. Men may shave face and neck.  Do not bring valuables to the hospital.  Contacts, dentures or bridgework may not be worn into surgery.  Leave suitcase in the car. After surgery it may be brought to your room.  For patients admitted to the hospital, checkout time is 11:00 AM the day of discharge.   Patients discharged the day of surgery will not be allowed to drive home.  Name and phone number of your driver:   Special Instructions: CHG Shower Use Special Wash: 1/2 bottle night before surgery and 1/2 bottle morning of surgery.   Please read over the following fact sheets that you were given: Pain Booklet, Coughing and Deep Breathing, MRSA Information and Surgical Site Infection Prevention

## 2011-08-31 NOTE — Assessment & Plan Note (Signed)
Currently controlled.

## 2011-08-31 NOTE — Assessment & Plan Note (Signed)
Chronic bronchitis with question of bronchiectasis. Better control.

## 2011-09-03 ENCOUNTER — Encounter (HOSPITAL_COMMUNITY): Payer: Self-pay

## 2011-09-03 ENCOUNTER — Encounter (HOSPITAL_COMMUNITY)
Admission: RE | Admit: 2011-09-03 | Discharge: 2011-09-03 | Disposition: A | Discharge status: Home / Self Care | Payer: Medicare Other | Source: Ambulatory Visit | Attending: Orthopedic Surgery | Admitting: Orthopedic Surgery

## 2011-09-03 ENCOUNTER — Ambulatory Visit (HOSPITAL_COMMUNITY)
Admission: RE | Admit: 2011-09-03 | Discharge: 2011-09-03 | Disposition: A | Discharge status: Home / Self Care | Payer: Medicare Other | Source: Ambulatory Visit | Attending: Orthopedic Surgery | Admitting: Orthopedic Surgery

## 2011-09-03 DIAGNOSIS — Z01818 Encounter for other preprocedural examination: Secondary | ICD-10-CM | POA: Insufficient documentation

## 2011-09-03 LAB — CBC
MCH: 31 pg (ref 26.0–34.0)
MCHC: 33.9 g/dL (ref 30.0–36.0)
Platelets: 358 10*3/uL (ref 150–400)
RBC: 3.94 MIL/uL (ref 3.87–5.11)

## 2011-09-03 LAB — COMPREHENSIVE METABOLIC PANEL
ALT: 12 U/L (ref 0–35)
AST: 17 U/L (ref 0–37)
CO2: 28 mEq/L (ref 19–32)
Calcium: 10.1 mg/dL (ref 8.4–10.5)
Sodium: 141 mEq/L (ref 135–145)
Total Protein: 7.7 g/dL (ref 6.0–8.3)

## 2011-09-03 LAB — URINALYSIS, ROUTINE W REFLEX MICROSCOPIC
Hgb urine dipstick: NEGATIVE
Specific Gravity, Urine: 1.015 (ref 1.005–1.030)
pH: 7 (ref 5.0–8.0)

## 2011-09-03 LAB — SURGICAL PCR SCREEN: Staphylococcus aureus: NEGATIVE

## 2011-09-03 NOTE — Progress Notes (Signed)
Spoke /w A. Zelenak, PAC, reviewed BP meds & need for CXR.  Will do CXR today .

## 2011-09-05 MED ORDER — CHLORHEXIDINE GLUCONATE 4 % EX LIQD: 60.0000 mL | Freq: Once | CUTANEOUS | Status: DC

## 2011-09-05 MED ORDER — VANCOMYCIN HCL IN DEXTROSE 1-5 GM/200ML-% IV SOLN
1000.0000 mg | INTRAVENOUS | Status: AC
  Administered 2011-09-06: 1000 mg via INTRAVENOUS
  Filled 2011-09-05: qty 200

## 2011-09-06 ENCOUNTER — Encounter (HOSPITAL_COMMUNITY): Payer: Self-pay | Admitting: Vascular Surgery

## 2011-09-06 ENCOUNTER — Encounter (HOSPITAL_COMMUNITY)
Admission: RE | Disposition: A | Discharge status: Home / Self Care | Payer: Self-pay | Source: Ambulatory Visit | Attending: Orthopedic Surgery

## 2011-09-06 ENCOUNTER — Ambulatory Visit (HOSPITAL_COMMUNITY): Payer: Medicare Other | Admitting: Anesthesiology

## 2011-09-06 ENCOUNTER — Encounter (HOSPITAL_COMMUNITY): Payer: Self-pay | Admitting: Anesthesiology

## 2011-09-06 ENCOUNTER — Observation Stay (HOSPITAL_COMMUNITY)
Admission: RE | Admit: 2011-09-06 | Discharge: 2011-09-07 | Disposition: A | Discharge status: Home / Self Care | Payer: Medicare Other | Source: Ambulatory Visit | Attending: Orthopedic Surgery | Admitting: Orthopedic Surgery

## 2011-09-06 ENCOUNTER — Encounter (HOSPITAL_COMMUNITY): Payer: Self-pay | Admitting: *Deleted

## 2011-09-06 DIAGNOSIS — K219 Gastro-esophageal reflux disease without esophagitis: Secondary | ICD-10-CM | POA: Insufficient documentation

## 2011-09-06 DIAGNOSIS — Z01818 Encounter for other preprocedural examination: Secondary | ICD-10-CM | POA: Insufficient documentation

## 2011-09-06 DIAGNOSIS — I10 Essential (primary) hypertension: Secondary | ICD-10-CM | POA: Insufficient documentation

## 2011-09-06 DIAGNOSIS — Z01812 Encounter for preprocedural laboratory examination: Secondary | ICD-10-CM | POA: Insufficient documentation

## 2011-09-06 DIAGNOSIS — G8918 Other acute postprocedural pain: Secondary | ICD-10-CM

## 2011-09-06 DIAGNOSIS — G5600 Carpal tunnel syndrome, unspecified upper limb: Principal | ICD-10-CM | POA: Insufficient documentation

## 2011-09-06 DIAGNOSIS — Z01810 Encounter for preprocedural cardiovascular examination: Secondary | ICD-10-CM | POA: Insufficient documentation

## 2011-09-06 HISTORY — PX: CARPAL TUNNEL RELEASE: SHX101

## 2011-09-06 SURGERY — CARPAL TUNNEL RELEASE
Anesthesia: General | Site: Wrist | Laterality: Right | Wound class: Clean

## 2011-09-06 MED ORDER — BUPIVACAINE HCL (PF) 0.25 % IJ SOLN
INTRAMUSCULAR | Status: DC | PRN
  Administered 2011-09-06: 10 mL

## 2011-09-06 MED ORDER — SODIUM CHLORIDE 0.9 % IV SOLN
INTRAVENOUS | Status: DC | PRN
  Administered 2011-09-06: 07:00:00 via INTRAVENOUS

## 2011-09-06 MED ORDER — PROPOFOL 10 MG/ML IV EMUL
INTRAVENOUS | Status: DC | PRN
  Administered 2011-09-06: 120 mg via INTRAVENOUS

## 2011-09-06 MED ORDER — FENTANYL CITRATE 0.05 MG/ML IJ SOLN: 25.0000 ug | INTRAMUSCULAR | Status: DC | PRN

## 2011-09-06 MED ORDER — ALBUTEROL SULFATE (5 MG/ML) 0.5% IN NEBU
2.5000 mg | INHALATION_SOLUTION | Freq: Four times a day (QID) | RESPIRATORY_TRACT | Status: DC
  Administered 2011-09-06 – 2011-09-07 (×4): 2.5 mg via RESPIRATORY_TRACT
  Filled 2011-09-06 (×4): qty 0.5

## 2011-09-06 MED ORDER — LORAZEPAM 2 MG/ML IJ SOLN: 1.0000 mg | Freq: Once | INTRAMUSCULAR | Status: DC | PRN

## 2011-09-06 MED ORDER — LIDOCAINE HCL (CARDIAC) 20 MG/ML IV SOLN
INTRAVENOUS | Status: DC | PRN
  Administered 2011-09-06: 50 mg via INTRAVENOUS

## 2011-09-06 MED ORDER — EPHEDRINE SULFATE 50 MG/ML IJ SOLN
INTRAMUSCULAR | Status: DC | PRN
  Administered 2011-09-06: 5 mg via INTRAVENOUS

## 2011-09-06 MED ORDER — ONDANSETRON HCL 4 MG/2ML IJ SOLN
INTRAMUSCULAR | Status: DC | PRN
  Administered 2011-09-06: 4 mg via INTRAVENOUS

## 2011-09-06 MED ORDER — MIDAZOLAM HCL 2 MG/2ML IJ SOLN: 1.0000 mg | INTRAMUSCULAR | Status: DC | PRN

## 2011-09-06 MED ORDER — OXYCODONE-ACETAMINOPHEN 5-325 MG PO TABS
2.0000 | ORAL_TABLET | ORAL | Status: DC | PRN
  Administered 2011-09-07 (×2): 2 via ORAL
  Filled 2011-09-06 (×2): qty 2

## 2011-09-06 MED ORDER — SIMVASTATIN 20 MG PO TABS
20.0000 mg | ORAL_TABLET | Freq: Every day | ORAL | Status: DC
  Administered 2011-09-06: 20 mg via ORAL
  Filled 2011-09-06 (×2): qty 1

## 2011-09-06 MED ORDER — POTASSIUM CHLORIDE CRYS ER 20 MEQ PO TBCR
20.0000 meq | EXTENDED_RELEASE_TABLET | Freq: Two times a day (BID) | ORAL | Status: DC
  Administered 2011-09-06 – 2011-09-07 (×3): 20 meq via ORAL
  Filled 2011-09-06 (×4): qty 1

## 2011-09-06 MED ORDER — LACTATED RINGERS IV SOLN
INTRAVENOUS | Status: DC | PRN
  Administered 2011-09-06: 07:00:00 via INTRAVENOUS

## 2011-09-06 MED ORDER — DEXTROSE-NACL 5-0.45 % IV SOLN: INTRAVENOUS | Status: DC

## 2011-09-06 MED ORDER — VERAPAMIL HCL 240 MG (CO) PO TB24: 240.0000 mg | ORAL_TABLET | Freq: Every day | ORAL | Status: DC

## 2011-09-06 MED ORDER — MIDAZOLAM HCL 5 MG/5ML IJ SOLN
INTRAMUSCULAR | Status: DC | PRN
  Administered 2011-09-06: 1 mg via INTRAVENOUS

## 2011-09-06 MED ORDER — ACETAMINOPHEN 500 MG PO TABS: 500.0000 mg | ORAL_TABLET | Freq: Four times a day (QID) | ORAL | Status: DC | PRN

## 2011-09-06 MED ORDER — ETODOLAC 400 MG PO TABS
400.0000 mg | ORAL_TABLET | Freq: Every day | ORAL | Status: DC
  Administered 2011-09-06 – 2011-09-07 (×2): 400 mg via ORAL
  Filled 2011-09-06 (×2): qty 1

## 2011-09-06 MED ORDER — FUROSEMIDE 20 MG PO TABS
10.0000 mg | ORAL_TABLET | Freq: Every day | ORAL | Status: DC
  Administered 2011-09-07: 5 mg via ORAL
  Filled 2011-09-06 (×2): qty 0.5

## 2011-09-06 MED ORDER — FENTANYL CITRATE 0.05 MG/ML IJ SOLN: 50.0000 ug | INTRAMUSCULAR | Status: DC | PRN

## 2011-09-06 MED ORDER — TRIAMTERENE-HCTZ 37.5-25 MG PO TABS
1.0000 | ORAL_TABLET | Freq: Every day | ORAL | Status: DC
  Administered 2011-09-06 – 2011-09-07 (×2): 1 via ORAL
  Filled 2011-09-06 (×2): qty 1

## 2011-09-06 MED ORDER — FENTANYL CITRATE 0.05 MG/ML IJ SOLN
INTRAMUSCULAR | Status: DC | PRN
  Administered 2011-09-06: 50 ug via INTRAVENOUS
  Administered 2011-09-06: 25 ug via INTRAVENOUS

## 2011-09-06 MED ORDER — LORATADINE 10 MG PO TABS
10.0000 mg | ORAL_TABLET | Freq: Every day | ORAL | Status: DC
  Administered 2011-09-06 – 2011-09-07 (×2): 10 mg via ORAL
  Filled 2011-09-06 (×2): qty 1

## 2011-09-06 MED ORDER — ALBUTEROL SULFATE HFA 108 (90 BASE) MCG/ACT IN AERS: 2.0000 | INHALATION_SPRAY | Freq: Four times a day (QID) | RESPIRATORY_TRACT | Status: DC | PRN

## 2011-09-06 MED ORDER — VERAPAMIL HCL ER 240 MG PO TBCR
240.0000 mg | EXTENDED_RELEASE_TABLET | Freq: Every day | ORAL | Status: DC
  Filled 2011-09-06 (×2): qty 1

## 2011-09-06 MED ORDER — ZOLPIDEM TARTRATE 5 MG PO TABS: 5.0000 mg | ORAL_TABLET | Freq: Every evening | ORAL | Status: DC | PRN

## 2011-09-06 SURGICAL SUPPLY — 32 items
BANDAGE ELASTIC 4 VELCRO ST LF (GAUZE/BANDAGES/DRESSINGS) ×2 IMPLANT
BANDAGE GAUZE ELAST BULKY 4 IN (GAUZE/BANDAGES/DRESSINGS) ×2 IMPLANT
BNDG CMPR 9X4 STRL LF SNTH (GAUZE/BANDAGES/DRESSINGS) ×1
BNDG ESMARK 4X9 LF (GAUZE/BANDAGES/DRESSINGS) ×2 IMPLANT
CLOTH BEACON ORANGE TIMEOUT ST (SAFETY) ×2 IMPLANT
COVER SURGICAL LIGHT HANDLE (MISCELLANEOUS) ×2 IMPLANT
CUFF TOURNIQUET SINGLE 18IN (TOURNIQUET CUFF) ×2 IMPLANT
CUFF TOURNIQUET SINGLE 24IN (TOURNIQUET CUFF) IMPLANT
DRAPE U-SHAPE 47X51 STRL (DRAPES) ×2 IMPLANT
DRSG EMULSION OIL 3X3 NADH (GAUZE/BANDAGES/DRESSINGS) ×2 IMPLANT
ELECT REM PT RETURN 9FT ADLT (ELECTROSURGICAL) ×2
ELECTRODE REM PT RTRN 9FT ADLT (ELECTROSURGICAL) ×1 IMPLANT
GLOVE SS PI 9.0 STRL (GLOVE) ×2 IMPLANT
GOWN PREVENTION PLUS XLARGE (GOWN DISPOSABLE) ×1 IMPLANT
GOWN STRL NON-REIN LRG LVL3 (GOWN DISPOSABLE) IMPLANT
KIT BASIN OR (CUSTOM PROCEDURE TRAY) ×2 IMPLANT
KIT ROOM TURNOVER OR (KITS) ×2 IMPLANT
NDL HYPO 25GX1X1/2 BEV (NEEDLE) ×1 IMPLANT
NEEDLE HYPO 25GX1X1/2 BEV (NEEDLE) ×2 IMPLANT
NS IRRIG 1000ML POUR BTL (IV SOLUTION) ×2 IMPLANT
PACK ORTHO EXTREMITY (CUSTOM PROCEDURE TRAY) ×2 IMPLANT
PAD ARMBOARD 7.5X6 YLW CONV (MISCELLANEOUS) ×4 IMPLANT
SPONGE GAUZE 4X4 12PLY (GAUZE/BANDAGES/DRESSINGS) ×2 IMPLANT
SPONGE LAP 4X18 X RAY DECT (DISPOSABLE) ×2 IMPLANT
SUCTION FRAZIER TIP 10 FR DISP (SUCTIONS) ×1 IMPLANT
SUT ETHILON 4 0 PS 2 18 (SUTURE) ×2 IMPLANT
SYR CONTROL 10ML LL (SYRINGE) ×2 IMPLANT
TOWEL OR 17X24 6PK STRL BLUE (TOWEL DISPOSABLE) ×2 IMPLANT
TOWEL OR 17X26 10 PK STRL BLUE (TOWEL DISPOSABLE) ×2 IMPLANT
TUBE CONNECTING 12X1/4 (SUCTIONS) ×1 IMPLANT
UNDERPAD 30X30 INCONTINENT (UNDERPADS AND DIAPERS) ×2 IMPLANT
WATER STERILE IRR 1000ML POUR (IV SOLUTION) ×2 IMPLANT

## 2011-09-06 NOTE — Op Note (Signed)
NAME:  Sheila Cisneros, Sheila Cisneros NO.:  [id number]  MEDICAL RECORD NO.:  [id number]  LOCATION:  [facility]                         FACILITY:  [facility]  PHYSICIAN:  Myrtie Neither, MD      DATE OF BIRTH:  1941-02-24  DATE OF PROCEDURE:  09/06/2011 DATE OF DISCHARGE:                              OPERATIVE REPORT   PREOPERATIVE DIAGNOSIS:  Carpal tunnel syndrome, right wrist.  POSTOPERATIVE DIAGNOSIS:  Carpal tunnel syndrome, right wrist.  ANESTHESIA:  General.  PROCEDURE:  Right carpal tunnel release.  DESCRIPTION:  The patient was taken to the operating room.  After given adequate preop medications and general endotracheal anesthesia was intubated.  Right arm was prepped with DuraPrep and draped in a sterile manner.  Tourniquet and Bovie used for hemostasis.  Curvilinear incision was made over the volar transverse crease and going through the skin, subcutaneous tissue, and down to the fascia.  Glorious Peach was placed underneath the fascia proximally to protect the  median nerve.  With the use of sharp scissors, the carpal tunnel was completely released down to the forearm.  Median nerve was freed.  The median nerve itself was intact.  Irrigation was done.  Hemostasis was obtained.  Wound closure was then done with 4-0 nylon.  Compressive dressing was applied.  The patient tolerated the procedure quite well, went to recovery room in stable and satisfactory condition.  The patient is being kept 23 hour observation due to her respiratory condition.  The patient will be discharged tomorrow on Percocet 1-2 q.4 p.r.n. for pain, ice packs, elevation, use of sling, and to return to the office in 1 week.     Myrtie Neither, MD     AC/MEDQ  D:  09/06/2011  T:  09/06/2011  Job:  [id number]

## 2011-09-06 NOTE — Anesthesia Preprocedure Evaluation (Addendum)
Anesthesia Evaluation  Patient identified by MRN, date of birth, ID band Patient awake    Reviewed: Allergy & Precautions, H&P , NPO status , Patient's Chart, lab work & pertinent test results  Airway Mallampati: III TM Distance: >3 FB Neck ROM: Full    Dental  (+) Teeth Intact and Dental Advisory Given   Pulmonary shortness of breath and with exertion, COPD COPD inhaler,  + rhonchi         Cardiovascular hypertension, Pt. on medications     Neuro/Psych    GI/Hepatic GERD-  Medicated and Controlled,(+) Hepatitis -, Unspecified  Endo/Other    Renal/GU      Musculoskeletal  (+) Arthritis -, Osteoarthritis,    Abdominal   Peds  Hematology   Anesthesia Other Findings   Reproductive/Obstetrics                         Anesthesia Physical Anesthesia Plan  ASA: III  Anesthesia Plan: General   Post-op Pain Management:    Induction: Intravenous  Airway Management Planned: LMA  Additional Equipment:   Intra-op Plan:   Post-operative Plan: Extubation in OR  Informed Consent: I have reviewed the patients History and Physical, chart, labs and discussed the procedure including the risks, benefits and alternatives for the proposed anesthesia with the patient or authorized representative who has indicated his/her understanding and acceptance.   Dental advisory given  Plan Discussed with: Surgeon and CRNA  Anesthesia Plan Comments:        Anesthesia Quick Evaluation

## 2011-09-06 NOTE — Preoperative (Signed)
Beta Blockers   Reason not to administer Beta Blockers:Not Applicable 

## 2011-09-06 NOTE — Anesthesia Postprocedure Evaluation (Signed)
  Anesthesia Post-op Note  Patient: Sheila Cisneros  Procedure(s) Performed: Procedure(s) (LRB): CARPAL TUNNEL RELEASE (Right)  Patient Location: PACU  Anesthesia Type: General  Level of Consciousness: awake  Airway and Oxygen Therapy: Patient Spontanous Breathing  Post-op Pain: mild  Post-op Assessment: Post-op Vital signs reviewed, Patient's Cardiovascular Status Stable, Respiratory Function Stable, Patent Airway, No signs of Nausea or vomiting and Pain level controlled  Post-op Vital Signs: stable  Complications: No apparent anesthesia complications

## 2011-09-06 NOTE — Transfer of Care (Signed)
Immediate Anesthesia Transfer of Care Note  Patient: Sheila Cisneros  Procedure(s) Performed: Procedure(s) (LRB): CARPAL TUNNEL RELEASE (Right)  Patient Location: PACU  Anesthesia Type: General  Level of Consciousness: awake, alert  and oriented  Airway & Oxygen Therapy: Patient Spontanous Breathing and Patient connected to nasal cannula oxygen  Post-op Assessment: Report given to PACU RN and Post -op Vital signs reviewed and stable  Post vital signs: Reviewed and stable  Complications: No apparent anesthesia complications

## 2011-09-06 NOTE — H&P (Signed)
Sheila Cisneros is an 71 y.o. female.   Chief Complaint: PAINFUL RIGHT HAND, AND NUMBNESS [id number] IS  A 71Y/O FEMALE TREARED FOR BILATERAL CARPAL TUNNEL SYNDROME  ,RIGHT BEING WORSE THAN THE LEFT.  Past Medical History  Diagnosis Date  . Osteoarthrosis, unspecified whether generalized or localized, unspecified site   . Gout, unspecified   . Hepatitis, unspecified   . Other chronic sinusitis   . Unspecified chronic bronchitis     sees Dr. Melba Coon, last visit 08/2011, treated for E. Coli- resp. /w Ceftin, Feb. 2013  . Hypertension   . GERD (gastroesophageal reflux disease)     rolaids if needed    Past Surgical History  Procedure Date  . Tubal ligation   . Vaginal hysterectomy   . Tonsillectomy   . Rotator cuff repair     left    Family History  Problem Relation Age of Onset  . Heart disease Father   . ALS Brother    Social History:  reports that she has never smoked. She has never used smokeless tobacco. She reports that she drinks alcohol. Her drug history not on file.  Allergies:  Allergies  Allergen Reactions  . Penicillins Itching and Rash    REACTION: rash  . Tetanus Toxoids Rash  . Levofloxacin   . Codeine Rash    REACTION: angioedema    Medications Prior to Admission  Medication Sig Dispense Refill  . acetaminophen (TYLENOL) 500 MG tablet Take 500 mg by mouth every 6 (six) hours as needed. For pain      . albuterol (PROAIR HFA) 108 (90 BASE) MCG/ACT inhaler Inhale 2 puffs into the lungs every 6 (six) hours as needed.        Marland Kitchen albuterol (PROVENTIL) (2.5 MG/3ML) 0.083% nebulizer solution Take 2.5 mg by nebulization every 6 (six) hours as needed. For wheezing or shortness of breath      . Ascorbic Acid (VITAMIN C) 500 MG tablet Take 500 mg by mouth daily.        Marland Kitchen BLACK COHOSH PO Take 1 tablet by mouth daily.        . cholecalciferol (VITAMIN D) 1000 UNITS tablet Take 1,000 Units by mouth daily.      Marland Kitchen etodolac (LODINE) 400 MG tablet Take 400 mg by mouth daily.         . fexofenadine-pseudoephedrine (ALLEGRA-D 12 HOUR) 60-120 MG per tablet Take 1 tablet by mouth 2 (two) times daily.        . furosemide (LASIX) 20 MG tablet Take 10 mg by mouth daily.        Marland Kitchen HYDROcodone-acetaminophen (VICODIN) 5-500 MG per tablet Take 1 tablet by mouth every 4 (four) hours as needed.        . Multiple Vitamin (MULTIVITAMIN) capsule Take 1 capsule by mouth daily.        . potassium chloride SA (K-DUR,KLOR-CON) 20 MEQ tablet Take 20 mEq by mouth 2 (two) times daily.        . simvastatin (ZOCOR) 20 MG tablet Take 20 mg by mouth at bedtime.        . triamterene-hydrochlorothiazide (MAXZIDE-25) 37.5-25 MG per tablet Take 1 tablet by mouth daily.        . verapamil (COVERA HS) 240 MG (CO) 24 hr tablet Take 240 mg by mouth at bedtime.          No results found for this or any previous visit (from the past 48 hour(s)). No results found.  Review of  Systems  Constitutional: Negative.   HENT: Negative.   Eyes: Negative.   Respiratory: Negative for cough, shortness of breath and wheezing.   Cardiovascular: Negative for leg swelling.  Gastrointestinal: Negative.   Genitourinary: Negative.   Musculoskeletal: Negative for joint pain.  Skin: Negative.   Neurological: Negative.   Endo/Heme/Allergies: Negative.   Psychiatric/Behavioral: Negative.     Blood pressure 137/72, pulse 65, temperature 98.3 Cisneros (36.8 C), resp. rate 18, height 4\' 11"  (1.499 m), weight 58.968 kg (130 lb), SpO2 97.00%. Physical Exam   Assessment/Plan CARPAL TUNNEL SYNDROME RIGHT WRIST Sheila Cisneros CARPAL TUNNEL RELEASE  RIGHT WRIST  Sheila Cisneros 09/06/2011, 7:23 AM

## 2011-09-06 NOTE — Anesthesia Procedure Notes (Signed)
Procedure Name: LMA Insertion Date/Time: 09/06/2011 7:43 AM Performed by: Neomia Dear, Manas Hickling K Pre-anesthesia Checklist: Patient identified, Timeout performed, Emergency Drugs available, Suction available and Patient being monitored Patient Re-evaluated:Patient Re-evaluated prior to inductionOxygen Delivery Method: Circle system utilized Preoxygenation: Pre-oxygenation with 100% oxygen Intubation Type: IV induction Ventilation: Mask ventilation without difficulty LMA: LMA inserted LMA Size: 3.0 Number of attempts: 1 Placement Confirmation: positive ETCO2 and breath sounds checked- equal and bilateral Tube secured with: Tape Dental Injury: Teeth and Oropharynx as per pre-operative assessment

## 2011-09-06 NOTE — Brief Op Note (Signed)
09/06/2011  8:20 AM  PATIENT:  Tiajuana Amass  71 y.o. female  PRE-OPERATIVE DIAGNOSIS:  CARPAL TUNNEL RIGHT WRIST  POST-OPERATIVE DIAGNOSIS:  CARPAL TUNNEL RIGHT WRIST  PROCEDURE:  Procedure(s) (LRB): CARPAL TUNNEL RELEASE (Right)  SURGEON:  Surgeon(s) and Role:    * Kennieth Rad, MD - Primary  PHYSICIAN ASSISTANT:   ASSISTANTS: none   ANESTHESIA:   general  EBL:  Total I/O In: 200 [I.V.:200] Out: 5 [Blood:5]  BLOOD ADMINISTERED:none  DRAINS: none   LOCAL MEDICATIONS USED:  MARCAINE     SPECIMEN:  No Specimen  DISPOSITION OF SPECIMEN:  N/A  COUNTS:  YES  TOURNIQUET:   Total Tourniquet Time Documented: Upper Arm (Right) - 23 minutes  DICTATION: .Other Dictation: Dictation Number report #[id number]  PLAN OF CARE: Admit for overnight observation  PATIENT DISPOSITION:  PACU - hemodynamically stable.   Delay start of Pharmacological VTE agent (>24hrs) due to surgical blood loss or risk of bleeding: not applicable

## 2011-09-06 NOTE — H&P (Signed)
NAME:  Sheila Cisneros, WROBLEWSKI NO.:  [id number]  MEDICAL RECORD NO.:  [id number]  LOCATION:  [facility]                         FACILITY:  [facility]  PHYSICIAN:  Myrtie Neither, MD      DATE OF BIRTH:  03-Aug-1940  DATE OF ADMISSION:  09/06/2011 DATE OF DISCHARGE:                             HISTORY & PHYSICAL   CHIEF COMPLAINT:  Painful right hand and wrist numbness, weakness of grip in the right hand.  HISTORY OF PRESENT ILLNESS:  This is a 71 year old female, who is being followed in the office with bilateral carpal tunnel syndrome, right wrist, being worse than the left with progressive weakness and persistent pain and numbness.  The patient had therapeutic injection involving the right carpal tunnel with no improvement.  Nerve conduction test demonstrated bilateral carpal tunnel, right worse than left.  PAST MEDICAL HISTORY: 1. High blood pressure. 2. GERD. 3. Arthritis. 4. History of tubal ligation. 5. Tonsillectomy. 6. History of rotator cuff repair. 7. Gout. 8. Hypertension. 9. Chronic sinusitis. 10.Bronchitis. 11.Hepatitis.  ALLERGIES: 1. PENICILLIN. 2. TETANUS TOXOID.  SOCIAL HISTORY:  No history of use of alcohol, tobacco, or illegal drugs.  FAMILY HISTORY:  Noncontributory.  MEDICATIONS:  Tylenol, albuterol, ascorbic acid, black cohosh, vitamin D, Lodine 400, Allegra, Lasix, Vicodin, multivitamins, potassium chloride, Zocor,  Maxzide, and verapamil.  REVIEW OF SYSTEMS:  Basically is that of COPD, bronchitis, some shortness of breath, and respiratory wheezes.  PHYSICAL EXAMINATION:  GENERAL: Alert and oriented. VITAL SIGNS: Temperature 98.3, pulse 65, respirations 18, blood pressure 137/72, O2 saturation 97%, height 4 feet 11 inches,  weight 58.96 kg. HEAD: Normocephalic. EYES: Conjunctivae clear. CHEST: Some rhonchi. CARDIAC: S1, S2.  Regular. EXTREMITIES: Right hand with pain and atrophy, intrinsic weakness, positive Tinel's, positive Phalen's  test, hyperesthesia in fingers along median nerve distribution.  Left wrist with positive Tinel's and positive Phalen's test.  Intrinsics and extrinsics good.  IMPRESSIONS:  Carpal tunnel syndrome, right wrist.  PLAN:  Right carpal tunnel release.     Myrtie Neither, MD     AC/MEDQ  D:  09/06/2011  T:  09/06/2011  Job:  [id number]

## 2011-09-07 MED ORDER — OXYCODONE-ACETAMINOPHEN 5-325 MG PO TABS: 2.0000 | ORAL_TABLET | ORAL | Status: AC | PRN

## 2011-09-07 NOTE — Care Management Note (Signed)
    Page 1 of 1   09/07/2011     1:43:13 PM   CARE MANAGEMENT NOTE 09/07/2011  Patient:  Sheila Cisneros, Sheila Cisneros   Account Number:  [id number]  Date Initiated:  09/07/2011  Documentation initiated by:  Anette Guarneri  Subjective/Objective Assessment:   POD#1 s/p carpal tunnel release  no DME needed, no PT/OT recommended     Action/Plan:   d/c home with self care   Anticipated DC Date:  09/07/2011   Anticipated DC Plan:  HOME/SELF CARE      DC Planning Services  CM consult      Choice offered to / List presented to:             Status of service:   Medicare Important Message given?   (If response is "NO", the following Medicare IM given date fields will be blank) Date Medicare IM given:   Date Additional Medicare IM given:    Discharge Disposition:  HOME/SELF CARE  Per UR Regulation:  Reviewed for med. necessity/level of care/duration of stay  If discussed at Long Length of Stay Meetings, dates discussed:    Comments:

## 2011-09-07 NOTE — Discharge Summary (Signed)
NAME:  ROBINN, OVERHOLT NO.:  [id number]  MEDICAL RECORD NO.:  [id number]  LOCATION:  5039                         FACILITY:  [facility]  PHYSICIAN:  Myrtie Neither, MD      DATE OF BIRTH:  09-Sep-1940  DATE OF ADMISSION:  09/06/2011 DATE OF DISCHARGE:  09/07/2011                              DISCHARGE SUMMARY   ADMITTING DIAGNOSIS:  Carpal tunnel, right wrist.  DISCHARGE DIAGNOSIS:  Carpal tunnel, right wrist.  COMPLICATIONS:  None.  INFECTIONS:  None.  PERTINENT HISTORY:  This is a 71 year old female being followed by a bilateral carpal tunnel syndrome, right being worse than left, with persistent pain and weakness with progressive worsening condition, not responding to therapeutic injections or medications.  Pertinent physical exam of her right hand with thenar atrophy and intrinsic weakness and positive Tinel's, positive Phalen test.  Nerve conduction test revealed severe carpal tunnel, right wrist.  HOSPITAL COURSE:  The patient underwent preop laboratory CBC, EKG, chest x-ray, CMET, UA.  The patient's labs were stable enough to undergo surgery.  The patient underwent right carpal tunnel release.  POSTOPERATIVE COURSE:  The patient's pain was brought under control, ice packs, elevation.  The patient has severe COPD and respiratory condition, which warranted her 23-hour observation.  The patient is stable.  Pain is under control.  Being discharged on Percocet 1-2 q.4 h. p.r.n. for pain.  Continued use of sling and ice packs and to return to the office in 1 week.  The patient is being discharged in stable and satisfactory condition.     Myrtie Neither, MD     AC/MEDQ  D:  09/07/2011  T:  09/07/2011  Job:  [id number]

## 2011-09-07 NOTE — Progress Notes (Signed)
Utilization review completed. Anette Guarneri, RN, BSN.  09/07/11

## 2011-09-11 ENCOUNTER — Encounter (HOSPITAL_COMMUNITY): Payer: Self-pay | Admitting: Orthopedic Surgery

## 2011-10-07 ENCOUNTER — Emergency Department (HOSPITAL_COMMUNITY)
Admission: EM | Admit: 2011-10-07 | Discharge: 2011-10-08 | Disposition: A | Discharge status: Home / Self Care | Payer: Medicare Other | Attending: Emergency Medicine | Admitting: Emergency Medicine

## 2011-10-07 ENCOUNTER — Encounter (HOSPITAL_COMMUNITY): Payer: Self-pay | Admitting: Emergency Medicine

## 2011-10-07 DIAGNOSIS — N9489 Other specified conditions associated with female genital organs and menstrual cycle: Secondary | ICD-10-CM | POA: Insufficient documentation

## 2011-10-07 DIAGNOSIS — J189 Pneumonia, unspecified organism: Secondary | ICD-10-CM | POA: Insufficient documentation

## 2011-10-07 DIAGNOSIS — R109 Unspecified abdominal pain: Secondary | ICD-10-CM

## 2011-10-07 DIAGNOSIS — K219 Gastro-esophageal reflux disease without esophagitis: Secondary | ICD-10-CM | POA: Insufficient documentation

## 2011-10-07 DIAGNOSIS — R1032 Left lower quadrant pain: Secondary | ICD-10-CM | POA: Insufficient documentation

## 2011-10-07 DIAGNOSIS — M1990 Unspecified osteoarthritis, unspecified site: Secondary | ICD-10-CM | POA: Insufficient documentation

## 2011-10-07 DIAGNOSIS — Z79899 Other long term (current) drug therapy: Secondary | ICD-10-CM | POA: Insufficient documentation

## 2011-10-07 DIAGNOSIS — I10 Essential (primary) hypertension: Secondary | ICD-10-CM | POA: Insufficient documentation

## 2011-10-07 LAB — DIFFERENTIAL
Basophils Relative: 0 % (ref 0–1)
Eosinophils Absolute: 0.2 10*3/uL (ref 0.0–0.7)
Monocytes Absolute: 0.7 10*3/uL (ref 0.1–1.0)
Monocytes Relative: 6 % (ref 3–12)

## 2011-10-07 LAB — CBC
HCT: 35.5 % — ABNORMAL LOW (ref 36.0–46.0)
Hemoglobin: 12.2 g/dL (ref 12.0–15.0)
MCH: 31 pg (ref 26.0–34.0)
MCHC: 34.4 g/dL (ref 30.0–36.0)
RDW: 13.2 % (ref 11.5–15.5)

## 2011-10-07 LAB — URINALYSIS, ROUTINE W REFLEX MICROSCOPIC
Glucose, UA: NEGATIVE mg/dL
Ketones, ur: NEGATIVE mg/dL
Nitrite: NEGATIVE
Protein, ur: NEGATIVE mg/dL
pH: 6 (ref 5.0–8.0)

## 2011-10-07 MED ORDER — SODIUM CHLORIDE 0.9 % IV BOLUS (SEPSIS)
1000.0000 mL | Freq: Once | INTRAVENOUS | Status: AC
  Administered 2011-10-08: 1000 mL via INTRAVENOUS

## 2011-10-07 MED ORDER — ONDANSETRON HCL 4 MG/2ML IJ SOLN
4.0000 mg | Freq: Once | INTRAMUSCULAR | Status: AC
  Administered 2011-10-08: 4 mg via INTRAVENOUS
  Filled 2011-10-07: qty 2

## 2011-10-07 NOTE — ED Notes (Signed)
I gave the patient a warm blanket. 

## 2011-10-07 NOTE — ED Notes (Signed)
C/o L sided pelvic pain.  States pain started Saturday morning and went away.  Pain started again this morning. Denies urinary complaints, nausea, and vomiting.

## 2011-10-07 NOTE — ED Provider Notes (Signed)
History     CSN: [id number]  Arrival date & time 10/07/11  2121   First MD Initiated Contact with Patient 10/07/11 2323      Chief Complaint  Patient presents with  . Pelvic Pain    (Consider location/radiation/quality/duration/timing/severity/associated sxs/prior treatment) HPI Comments: LLQ pain that has been intermittent over the past several days but constant tonight.  Relieved with hydrocodone at home.  No nausea, vomiting, diarrhea, fever, urinary symptoms.  Normal BM this morning.  No diarrhea, chest pain, SOB, back pain.  Hx hysterectomy and tubal ligation.  The history is provided by the patient.    Past Medical History  Diagnosis Date  . Osteoarthrosis, unspecified whether generalized or localized, unspecified site   . Gout, unspecified   . Hepatitis, unspecified   . Other chronic sinusitis   . Unspecified chronic bronchitis     sees Dr. Melba Coon, last visit 08/2011, treated for E. Coli- resp. /w Ceftin, Feb. 2013  . Hypertension   . GERD (gastroesophageal reflux disease)     rolaids if needed    Past Surgical History  Procedure Date  . Tubal ligation   . Vaginal hysterectomy   . Tonsillectomy   . Rotator cuff repair     left  . Carpal tunnel release 09/06/2011    Procedure: CARPAL TUNNEL RELEASE;  Surgeon: Kennieth Rad, MD;  Location: [facility] OR;  Service: Orthopedics;  Laterality: Right;    Family History  Problem Relation Age of Onset  . Heart disease Father   . ALS Brother     History  Substance Use Topics  . Smoking status: Never Smoker   . Smokeless tobacco: Never Used  . Alcohol Use: Yes     occassionally    OB History    Grav Para Term Preterm Abortions TAB SAB Ect Mult Living                  Review of Systems  Constitutional: Negative for fever, activity change and appetite change.  HENT: Negative for congestion and rhinorrhea.   Eyes: Negative for photophobia.  Respiratory: Negative for cough, chest tightness and shortness of  breath.   Cardiovascular: Negative for chest pain.  Gastrointestinal: Positive for abdominal pain. Negative for nausea, vomiting and diarrhea.  Genitourinary: Positive for pelvic pain. Negative for vaginal bleeding and vaginal discharge.  Musculoskeletal: Negative for back pain.  Skin: Negative for rash.  Neurological: Negative for dizziness and headaches.    Allergies  Penicillins; Tetanus toxoids; Codeine; and Levofloxacin  Home Medications   Current Outpatient Rx  Name Route Sig Dispense Refill  . ACETAMINOPHEN 500 MG PO TABS Oral Take 500 mg by mouth every 6 (six) hours as needed. For pain    . ALBUTEROL SULFATE HFA 108 (90 BASE) MCG/ACT IN AERS Inhalation Inhale 2 puffs into the lungs every 6 (six) hours as needed.      . ALBUTEROL SULFATE (2.5 MG/3ML) 0.083% IN NEBU Nebulization Take 2.5 mg by nebulization every 6 (six) hours as needed. For wheezing or shortness of breath    . VITAMIN C 500 MG PO TABS Oral Take 500 mg by mouth daily.      Marland Kitchen BENZONATATE 100 MG PO CAPS Oral Take 100 mg by mouth 2 (two) times daily.    Marland Kitchen BLACK COHOSH PO Oral Take 1 tablet by mouth daily.      Marland Kitchen HYDROCOD POLST-CPM POLST ER 10-8 MG/5ML PO LQCR Oral Take 5 mLs by mouth every 12 (twelve) hours.    Marland Kitchen  VITAMIN D 1000 UNITS PO TABS Oral Take 1,000 Units by mouth daily.    . ETODOLAC 400 MG PO TABS Oral Take 400 mg by mouth daily.      Marland Kitchen FEXOFENADINE-PSEUDOEPHED ER 60-120 MG PO TB12 Oral Take 1 tablet by mouth 2 (two) times daily.      Marland Kitchen LASIX PO Oral Take 2.5 mg by mouth See admin instructions. Takes 1/4 of a 10mg  tablet a few times a week for swelling/per patient    . HYDROCODONE-ACETAMINOPHEN 10-650 MG PO TABS Oral Take 0.5-1 tablets by mouth every 6 (six) hours as needed. For pain    . MULTIVITAMINS PO CAPS Oral Take 1 capsule by mouth daily.      Marland Kitchen POTASSIUM CHLORIDE CRYS ER 20 MEQ PO TBCR Oral Take 20 mEq by mouth 2 (two) times daily.      Marland Kitchen SIMVASTATIN 20 MG PO TABS Oral Take 20 mg by mouth at bedtime.       . TRIAMTERENE-HCTZ 37.5-25 MG PO TABS Oral Take 1 tablet by mouth daily.      Marland Kitchen VERAPAMIL HCL ER (CO) 240 MG PO TB24 Oral Take 240 mg by mouth at bedtime.      Marland Kitchen DOXYCYCLINE HYCLATE 100 MG PO CAPS Oral Take 1 capsule (100 mg total) by mouth 2 (two) times daily. 20 capsule 0  . ONDANSETRON HCL 4 MG PO TABS Oral Take 1 tablet (4 mg total) by mouth every 6 (six) hours. 12 tablet 0    BP 138/62  Temp 98.3 F (36.8 C) (Oral)  Resp 18  SpO2 98%  Physical Exam  Constitutional: She is oriented to person, place, and time. She appears well-developed and well-nourished. No distress.  HENT:  Head: Normocephalic and atraumatic.  Mouth/Throat: Oropharynx is clear and moist.  Eyes: Conjunctivae are normal. Pupils are equal, round, and reactive to light.  Neck: Normal range of motion. Neck supple.  Cardiovascular: Normal rate, regular rhythm and normal heart sounds.   No murmur heard. Pulmonary/Chest: Effort normal. No respiratory distress.  Abdominal: Soft. There is tenderness.       TTP LLQ, no guarding or rebound  Musculoskeletal: Normal range of motion. She exhibits no edema and no tenderness.  Neurological: She is alert and oriented to person, place, and time. No cranial nerve deficit.  Skin: Skin is warm.    ED Course  Procedures (including critical care time)  Labs Reviewed  URINALYSIS, ROUTINE W REFLEX MICROSCOPIC - Abnormal; Notable for the following:    Bilirubin Urine LARGE (*)     All other components within normal limits  CBC - Abnormal; Notable for the following:    WBC 11.5 (*)     HCT 35.5 (*)     All other components within normal limits  DIFFERENTIAL - Abnormal; Notable for the following:    Lymphs Abs 4.1 (*)     All other components within normal limits  COMPREHENSIVE METABOLIC PANEL - Abnormal; Notable for the following:    Glucose, Bld 129 (*)     Albumin 3.3 (*)     Alkaline Phosphatase 167 (*)     Total Bilirubin 0.2 (*)     GFR calc non Af Amer 67 (*)       GFR calc Af Amer 78 (*)     All other components within normal limits  LIPASE, BLOOD  LACTIC ACID, PLASMA   Dg Chest 2 View  10/08/2011  *RADIOLOGY REPORT*  Clinical Data: Shortness of breath, cough, left-sided abdominal  pain.  COPD.  CHEST - 2 VIEW  Comparison: 09/03/2011  Findings: Thoracolumbar scoliosis.  Normal heart size and pulmonary vascularity.  Mild pulmonary hyperinflation.  Linear fibrosis or atelectasis in the lung bases.  No blunting of costophrenic angles. No pneumothorax.  No focal consolidation.  No significant changes since the previous study.  IMPRESSION: Fibrosis or atelectasis in the lung bases.  No evidence of active consolidation.  No significant interval change.  Original Report Authenticated By: Marlon Pel, M.D.   Ct Abdomen Pelvis W Contrast  10/08/2011  *RADIOLOGY REPORT*  Clinical Data: Left-sided lower abdominal pain and cramping.  CT ABDOMEN AND PELVIS WITH CONTRAST  Technique:  Multidetector CT imaging of the abdomen and pelvis was performed following the standard protocol during bolus administration of intravenous contrast.  Contrast: 80mL OMNIPAQUE IOHEXOL 300 MG/ML  SOLN  Comparison: 10/19/2006  Findings: The lung bases demonstrate bronchiectasis with nodular interstitial changes similar to the previous study.  There is interval development of consolidation focally in the left lung base which might represent pneumonia.  Circumscribed low attenuation lesion consistent with cyst in the right lobe of the liver measuring 11 mm diameter, stable since previous study.  No additional liver lesions are demonstrated.  The gallbladder, spleen, adrenal glands, kidneys, abdominal aorta, and retroperitoneal lymph nodes are unremarkable.  Mild aortic calcification.  The pancreas is atrophic but otherwise unremarkable.  Flow is demonstrated in the portal and mesenteric vessels.  The stomach is incompletely distended but there may be gastric wall thickening suggesting gastritis.   No evidence of focal mass lesion.  The small bowel are not distended.  The colon is stool-filled without wall thickening or distension.  No free air or free fluid in the abdomen.  Small umbilical hernia containing fat with some stranding which might be due to fat necrosis.  Pelvis:  The uterus is surgically absent.  No abnormal adnexal masses.  No significant lymphadenopathy in the pelvis.  The bladder wall is not thickened.  No free or loculated pelvic fluid collections.  The appendix is not specifically visualized.  Nothing to suggest diverticulitis.  Scoliosis and degenerative changes in the lumbar spine.  Spondylolysis and spondylolisthesis at L4-5.  IMPRESSION: Mild diffuse gastric wall thickening which might represent gastritis.  Stable cyst in the liver.  Small umbilical hernia containing fat with possible fat necrosis.  Lung bases demonstrate chronic bronchiectasis and interstitial infiltrates, likely fibrosis.  Focal consolidation in the left lung base could represent pneumonia.  Original Report Authenticated By: Marlon Pel, M.D.     1. Abdominal  pain, other specified site   2. CAP (community acquired pneumonia)       MDM  LLQ pain without associated symptoms.  NO pain at this time, abdomen soft. Vitals stable.  CT to evaluate for diverticulitis.  Labs, UA.  Labs unremarkable.  No explanation for LLQ on CT.  No pain at site of umbilical hernia. Questionable L sided PNA.  Given leukocytosis and cough, will treat for CAP. Return precautions discussed.  Glynn Octave, MD 10/08/11 [phone]

## 2011-10-08 ENCOUNTER — Emergency Department (HOSPITAL_COMMUNITY): Payer: Medicare Other

## 2011-10-08 LAB — LIPASE, BLOOD: Lipase: 12 U/L (ref 11–59)

## 2011-10-08 LAB — COMPREHENSIVE METABOLIC PANEL
Albumin: 3.3 g/dL — ABNORMAL LOW (ref 3.5–5.2)
BUN: 17 mg/dL (ref 6–23)
Calcium: 10.4 mg/dL (ref 8.4–10.5)
Creatinine, Ser: 0.86 mg/dL (ref 0.50–1.10)
Total Protein: 7.8 g/dL (ref 6.0–8.3)

## 2011-10-08 MED ORDER — IOHEXOL 300 MG/ML  SOLN
80.0000 mL | Freq: Once | INTRAMUSCULAR | Status: AC | PRN
  Administered 2011-10-08: 80 mL via INTRAVENOUS

## 2011-10-08 MED ORDER — ONDANSETRON HCL 4 MG PO TABS: 4.0000 mg | ORAL_TABLET | Freq: Four times a day (QID) | ORAL | Status: AC

## 2011-10-08 MED ORDER — DOXYCYCLINE HYCLATE 100 MG PO CAPS: 100.0000 mg | ORAL_CAPSULE | Freq: Two times a day (BID) | ORAL | Status: AC

## 2011-10-08 MED ORDER — DEXTROSE 5 % IV SOLN
500.0000 mg | Freq: Once | INTRAVENOUS | Status: AC
  Administered 2011-10-08: 500 mg via INTRAVENOUS
  Filled 2011-10-08: qty 500

## 2011-10-08 MED ORDER — IOHEXOL 300 MG/ML  SOLN
20.0000 mL | INTRAMUSCULAR | Status: AC
  Administered 2011-10-08: 20 mL via INTRAVENOUS

## 2011-10-08 MED ORDER — FENTANYL CITRATE 0.05 MG/ML IJ SOLN
50.0000 ug | Freq: Once | INTRAMUSCULAR | Status: AC
  Administered 2011-10-08: 50 ug via INTRAVENOUS
  Filled 2011-10-08: qty 2

## 2011-10-08 MED ORDER — DEXTROSE 5 % IV SOLN
1.0000 g | Freq: Once | INTRAVENOUS | Status: AC
  Administered 2011-10-08: 1 g via INTRAVENOUS
  Filled 2011-10-08: qty 10

## 2011-10-08 NOTE — ED Notes (Signed)
Zithromax infusion has been completed - Pt reports that she is feeling better.

## 2011-10-08 NOTE — Discharge Instructions (Signed)
Abdominal Pain Abdominal pain can be caused by many things. Your caregiver decides the seriousness of your pain by an examination and possibly blood tests and X-rays. Many cases can be observed and treated at home. Most abdominal pain is not caused by a disease and will probably improve without treatment. However, in many cases, more time must pass before a clear cause of the pain can be found. Before that point, it may not be known if you need more testing, or if hospitalization or surgery is needed. HOME CARE INSTRUCTIONS   Do not take laxatives unless directed by your caregiver.   Take pain medicine only as directed by your caregiver.   Only take over-the-counter or prescription medicines for pain, discomfort, or fever as directed by your caregiver.   Try a clear liquid diet (broth, tea, or water) for as long as directed by your caregiver. Slowly move to a bland diet as tolerated.  SEEK IMMEDIATE MEDICAL CARE IF:   The pain does not go away.   You have a fever.   You keep throwing up (vomiting).   The pain is felt only in portions of the abdomen. Pain in the right side could possibly be appendicitis. In an adult, pain in the left lower portion of the abdomen could be colitis or diverticulitis.   You pass bloody or black tarry stools.  MAKE SURE YOU:   Understand these instructions.   Will watch your condition.   Will get help right away if you are not doing well or get worse.  Document Released: 01/10/2005 Document Revised: 03/22/2011 Document Reviewed: 11/19/2007 [facility] Patient Information 2012 [place], [place].Pneumonia, Adult Pneumonia is an infection of the lungs.  CAUSES Pneumonia may be caused by bacteria or a virus. Usually, these infections are caused by breathing infectious particles into the lungs (respiratory tract). SYMPTOMS   Cough.   Fever.   Chest pain.   Increased rate of breathing.   Wheezing.   Mucus production.  DIAGNOSIS  If you have the common  symptoms of pneumonia, your caregiver will typically confirm the diagnosis with a chest X-ray. The X-ray will show an abnormality in the lung (pulmonary infiltrate) if you have pneumonia. Other tests of your blood, urine, or sputum may be done to find the specific cause of your pneumonia. Your caregiver may also do tests (blood gases or pulse oximetry) to see how well your lungs are working. TREATMENT  Some forms of pneumonia may be spread to other people when you cough or sneeze. You may be asked to wear a mask before and during your exam. Pneumonia that is caused by bacteria is treated with antibiotic medicine. Pneumonia that is caused by the influenza virus may be treated with an antiviral medicine. Most other viral infections must run their course. These infections will not respond to antibiotics.  PREVENTION A pneumococcal shot (vaccine) is available to prevent a common bacterial cause of pneumonia. This is usually suggested for:  People over 22 years old.   Patients on chemotherapy.   People with chronic lung problems, such as bronchitis or emphysema.   People with immune system problems.  If you are over 65 or have a high risk condition, you may receive the pneumococcal vaccine if you have not received it before. In some countries, a routine influenza vaccine is also recommended. This vaccine can help prevent some cases of pneumonia.You may be offered the influenza vaccine as part of your care. If you smoke, it is time to quit. You may  receive instructions on how to stop smoking. Your caregiver can provide medicines and counseling to help you quit. HOME CARE INSTRUCTIONS   Cough suppressants may be used if you are losing too much rest. However, coughing protects you by clearing your lungs. You should avoid using cough suppressants if you can.   Your caregiver may have prescribed medicine if he or she thinks your pneumonia is caused by a bacteria or influenza. Finish your medicine even if  you start to feel better.   Your caregiver may also prescribe an expectorant. This loosens the mucus to be coughed up.   Only take over-the-counter or prescription medicines for pain, discomfort, or fever as directed by your caregiver.   Do not smoke. Smoking is a common cause of bronchitis and can contribute to pneumonia. If you are a smoker and continue to smoke, your cough may last several weeks after your pneumonia has cleared.   A cold steam vaporizer or humidifier in your room or home may help loosen mucus.   Coughing is often worse at night. Sleeping in a semi-upright position in a recliner or using a couple pillows under your head will help with this.   Get rest as you feel it is needed. Your body will usually let you know when you need to rest.  SEEK IMMEDIATE MEDICAL CARE IF:   Your illness becomes worse. This is especially true if you are elderly or weakened from any other disease.   You cannot control your cough with suppressants and are losing sleep.   You begin coughing up blood.   You develop pain which is getting worse or is uncontrolled with medicines.   You have a fever.   Any of the symptoms which initially brought you in for treatment are getting worse rather than better.   You develop shortness of breath or chest pain.  MAKE SURE YOU:   Understand these instructions.   Will watch your condition.   Will get help right away if you are not doing well or get worse.  Document Released: 04/02/2005 Document Revised: 03/22/2011 Document Reviewed: 06/22/2010 [facility] Patient Information 2012 [place], [place].

## 2012-02-04 ENCOUNTER — Ambulatory Visit
Admission: RE | Admit: 2012-02-04 | Discharge: 2012-02-04 | Disposition: A | Discharge status: Home / Self Care | Payer: Medicare Other | Source: Ambulatory Visit | Attending: Internal Medicine | Admitting: Internal Medicine

## 2012-02-04 ENCOUNTER — Other Ambulatory Visit: Payer: Self-pay | Admitting: Internal Medicine

## 2012-02-04 DIAGNOSIS — R05 Cough: Secondary | ICD-10-CM

## 2012-02-04 DIAGNOSIS — R059 Cough, unspecified: Secondary | ICD-10-CM

## 2012-02-04 DIAGNOSIS — J45909 Unspecified asthma, uncomplicated: Secondary | ICD-10-CM

## 2012-02-25 ENCOUNTER — Encounter: Payer: Self-pay | Admitting: Internal Medicine

## 2012-02-25 ENCOUNTER — Ambulatory Visit (INDEPENDENT_AMBULATORY_CARE_PROVIDER_SITE_OTHER): Payer: Medicare Other | Admitting: Internal Medicine

## 2012-02-25 VITALS — BP 122/76 | HR 70 | Ht 58.5 in | Wt 132.2 lb

## 2012-02-25 DIAGNOSIS — J42 Unspecified chronic bronchitis: Secondary | ICD-10-CM

## 2012-02-25 NOTE — Patient Instructions (Addendum)
Please call as needed 

## 2012-02-25 NOTE — Progress Notes (Signed)
Patient ID: Sheila Cisneros, female    DOB: [birth year], 71 y.o.   MRN: [id number] HPI  07/31/10- 70 yoF followed for chronic bronchitis/ bronchiectasis and hx rhinosinusitis. Never smoker. Last here- January 30, 2010. She remained congested throough the winter, but she put up with it. No events needing medical visits. Pollen now makes cough somewhat more constant. Rarely productive. Denies fever or night sweats. Nebulizer still helps, used about twice daily. Denies  Chest pain or headache.   09/05/10- Chronic bronchitis, bronchiectasis, hx rhinosinusitis Reports increased chest congestion,producitve cough dark yellow.  Denies fever or sweat.She apparently didn't actually get a script for Daliresp last visit. We discussed side effects again. She is trying to exercise- water aerobics and walking.  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  Acute visit. Cough worse in past week. Was put on prednisone 2 weeks ago for hip- didn't help chest-  pred ended yesterday. Occasional hot flash but no sustained fever. Continues Daliresp.  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  She notices a little routine cough but it has been more productive with some darkening sputum and thicker mucus in the last week. No fever, sore throat or sweats. Sinuses feel normal. We had previously tried [employer]- she remembers it kept her awake at night. We talked about trying it again if necessary to suppress her repeated bronchitis.  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  She comes for an acute visit. She has been around a lot of family recently. 2 days ago she thinks she went outdoors too quickly after her bath and may have gotten chills. Next on waking, she noted sore throat no fever no stomach upset. No  sinus congestion but sniffing a lot. Mainly complains of chest congestion and rattle cough productive of thick yellow sputum which has been sometimes brown. She started herself on doxycycline. We discussed her earlier experience with Daliresp. She never took it, although she bought it, because she disliked the printed side effect list. I went over those and my own experience with this drug again today. Has had flu shot this year.  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  Has had flu vaccine. After last visit she tried Daliresp but found it too expensive. She keeps her persistent deep chest rattle and coughs scant gray sputum. Does not really feel badly and is able to exercise. Does sometimes cough until she retches. Does not recognize reflux or sinus pain/drainage. She is using her Flutter device twice daily for pulmonary toilet.  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  CT 08/25/10-  1. Bilateral cylindrical type atelectasis within the lower lobes.  2. Bilateral lower lobe airspace densities, likely the sequela of  inflammation or infection. Suggest short-term follow-up  examination to ensure resolution.  3. Scattered small nodules within the right upper lobe are also  likely related to inflammation or infection. A short-term follow-  up examination in 3 months is recommended to ensure resolution.  Original Report Authenticated By: Rosealee AlbeeAYLOR H. STROUD, M.D.  Had flu vax. Mucomyst is not available - prescribed last visit.  Felt well after last here until took chill first of this week. Sore throat, increased productive cough and congestion. Head stuffy, no HA. Sputum cx  02/26/11- grew E. Coli. Septra was called in and helped, after refill she has now been off x 1 week.  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  At last office visit she stopped Daliresp as ineffective. We had given Ceftin for Escherichia coli on sputum culture. She admits cough has been better but remains productive of yellow sputum off and on. Denies chest pain, fever, chills, enlarged nodes.  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 Recently had flare up last week due to pollen but has gotten better now. She noted mostly nasal stuffiness. Not much cough or wheeze and no recent need for antibiotics.  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  Patient states that cough has been "off and on", depends on weather. pt c/o head congestion today Her chronic cough is very sensitive to weather changes. She asked me to evaluate a bulge on her back, demonstrating scoliosis with protrusion of right posterior rib cage. Relatively little productive cough recently and no fever or blood. CXR 02/04/12-reviewed IMPRESSION:  Stable basilar scarring. Stable cardiomegaly. No active lung  disease.  Original Report Authenticated By: Juline Patch, M.D.   Review of Systems-see HPI   Constitutional:   No-   weight loss, night sweats, fevers, +chills, fatigue, lassitude. HEENT:   No-  headaches, difficulty swallowing, tooth/dental problems, sore throat,       No-  sneezing, itching, ear ache, nasal congestion, post nasal drip,  CV:  No-   chest pain, orthopnea, PND, swelling in lower extremities, anasarca, dizziness, palpitations Resp: No- acute  shortness of breath with exertion or at rest.              +  productive cough,  + non-productive cough,  No-  coughing up of blood.              No- change in color of mucus.  No- wheezing.   Skin: No-   rash or lesions. GI:  No-   heartburn, indigestion, abdominal pain, nausea, vomiting, GU: MS:  No-   joint pain or  swelling.  + Asymmetry right posterior ribs Neuro- nothing unusual Psych:  No- change in mood or affect. No depression or anxiety.  No memory loss.   Objective:   Physical Exam   General- Alert, Oriented, Affect-appropriate, Distress- none acute Skin- rash-none, lesions- none, excoriation- none Lymphadenopathy- none Head- atraumatic            Eyes- Gross vision intact, PERRLA, conjunctivae clear secretions            Ears- Hearing, canals-normal            Nose- Clear, no-Septal dev, mucus, polyps, erosion, perforation, .            Throat- Mallampati III , mucosa clear , drainage- none, tonsils- atrophic Neck- flexible , trachea midline, no stridor , thyroid nl, carotid no bruit Chest - symmetrical excursion , unlabored           Heart/CV- RRR , no murmur , no gallop  , no rub, nl s1 s2                           - JVD- none , edema- none, stasis changes- none, varices- none  Lung-  diminished with  few rattles,  dullness-none, rub- none, unlabored; room air saturation 97%           Chest wall- right posterior lower thoracic ribs bulge reflecting scoliosis Abd-  Br/ Gen/ Rectal- Not done, not indicated Extrem- cyanosis- none, clubbing, none, atrophy- none, strength- nl Neuro- grossly intact to observation

## 2012-03-03 ENCOUNTER — Other Ambulatory Visit: Payer: Self-pay | Admitting: Neurology

## 2012-03-03 DIAGNOSIS — I10 Essential (primary) hypertension: Secondary | ICD-10-CM

## 2012-03-03 DIAGNOSIS — R413 Other amnesia: Secondary | ICD-10-CM

## 2012-03-03 DIAGNOSIS — I509 Heart failure, unspecified: Secondary | ICD-10-CM

## 2012-03-03 NOTE — Assessment & Plan Note (Signed)
Relatively stable within her baseline range of productive cough. No acute changes to offer. Incidental chest wall asymmetry I think reflects scoliosis and some protrusion of ribs is not an acute issue

## 2012-03-07 ENCOUNTER — Ambulatory Visit
Admission: RE | Admit: 2012-03-07 | Discharge: 2012-03-07 | Disposition: A | Discharge status: Home / Self Care | Payer: Medicare Other | Source: Ambulatory Visit | Attending: Neurology | Admitting: Neurology

## 2012-03-07 DIAGNOSIS — R413 Other amnesia: Secondary | ICD-10-CM

## 2012-03-07 DIAGNOSIS — I10 Essential (primary) hypertension: Secondary | ICD-10-CM

## 2012-03-07 DIAGNOSIS — I509 Heart failure, unspecified: Secondary | ICD-10-CM

## 2012-08-23 ENCOUNTER — Emergency Department (HOSPITAL_COMMUNITY)
Admission: EM | Admit: 2012-08-23 | Discharge: 2012-08-23 | Disposition: A | Discharge status: Home / Self Care | Payer: Medicare Other | Attending: Emergency Medicine | Admitting: Emergency Medicine

## 2012-08-23 ENCOUNTER — Encounter (HOSPITAL_COMMUNITY): Payer: Self-pay | Admitting: Cardiology

## 2012-08-23 DIAGNOSIS — Z8739 Personal history of other diseases of the musculoskeletal system and connective tissue: Secondary | ICD-10-CM | POA: Insufficient documentation

## 2012-08-23 DIAGNOSIS — Z79899 Other long term (current) drug therapy: Secondary | ICD-10-CM | POA: Insufficient documentation

## 2012-08-23 DIAGNOSIS — R531 Weakness: Secondary | ICD-10-CM

## 2012-08-23 DIAGNOSIS — R5381 Other malaise: Secondary | ICD-10-CM | POA: Insufficient documentation

## 2012-08-23 DIAGNOSIS — M109 Gout, unspecified: Secondary | ICD-10-CM | POA: Insufficient documentation

## 2012-08-23 DIAGNOSIS — Z880 Allergy status to penicillin: Secondary | ICD-10-CM | POA: Insufficient documentation

## 2012-08-23 DIAGNOSIS — I10 Essential (primary) hypertension: Secondary | ICD-10-CM | POA: Insufficient documentation

## 2012-08-23 DIAGNOSIS — K219 Gastro-esophageal reflux disease without esophagitis: Secondary | ICD-10-CM | POA: Insufficient documentation

## 2012-08-23 LAB — BASIC METABOLIC PANEL
BUN: 15 mg/dL (ref 6–23)
CO2: 29 mEq/L (ref 19–32)
Calcium: 9.7 mg/dL (ref 8.4–10.5)
GFR calc non Af Amer: 65 mL/min — ABNORMAL LOW (ref >90)
Glucose, Bld: 126 mg/dL — ABNORMAL HIGH (ref 70–99)
Potassium: 3.3 mEq/L — ABNORMAL LOW (ref 3.5–5.1)
Sodium: 139 mEq/L (ref 135–145)

## 2012-08-23 LAB — GLUCOSE, CAPILLARY: Glucose-Capillary: 108 mg/dL — ABNORMAL HIGH (ref 70–99)

## 2012-08-23 LAB — CBC WITH DIFFERENTIAL/PLATELET
Eosinophils Absolute: 0.3 10*3/uL (ref 0.0–0.7)
Eosinophils Relative: 3 % (ref 0–5)
HCT: 33.5 % — ABNORMAL LOW (ref 36.0–46.0)
Hemoglobin: 12 g/dL (ref 12.0–15.0)
Lymphocytes Relative: 28 % (ref 12–46)
Lymphs Abs: 2.2 10*3/uL (ref 0.7–4.0)
MCH: 31.7 pg (ref 26.0–34.0)
MCV: 88.4 fL (ref 78.0–100.0)
Monocytes Relative: 5 % (ref 3–12)
RBC: 3.79 MIL/uL — ABNORMAL LOW (ref 3.87–5.11)
WBC: 8 10*3/uL (ref 4.0–10.5)

## 2012-08-23 LAB — URINALYSIS, ROUTINE W REFLEX MICROSCOPIC
Glucose, UA: NEGATIVE mg/dL
Hgb urine dipstick: NEGATIVE
Specific Gravity, Urine: 1.016 (ref 1.005–1.030)
Urobilinogen, UA: 0.2 mg/dL (ref 0.0–1.0)

## 2012-08-23 LAB — URINE MICROSCOPIC-ADD ON

## 2012-08-23 NOTE — ED Notes (Addendum)
Pt reports that over the past couple of days she has been having pain in her legs and lower back. Also reports some generalized weakness. Denies any sob or chest pain but feels like all her energy is gone. Also reports some rectal pain and pressure when she stands.

## 2012-08-23 NOTE — ED Provider Notes (Signed)
History     CSN: [id number]  Arrival date & time 08/23/12  1113   First MD Initiated Contact with Patient 08/23/12 1121      Chief Complaint  Patient presents with  . Weakness    (Consider location/radiation/quality/duration/timing/severity/associated sxs/prior treatment) HPI  72 year old female with history of osteoarthritis, and history of spinal stenosis presents complaining of generalized weakness. Patient reports she is feeling generalized weakness for the past weeks. Describe sensation as lack of motivation to get out of bed, and desired to stay in bed. Symptom has been waxing waning for the past several days however this morning patient felt nauseous and feel as if all of her energy is gone.  She also endorsed some tingling sensation to the right leg, waxing waning, currently not present. Patient reports feeling constipated, last bowel movement was this morning but only produced a small amount. Otherwise she denies fever, headache, sore throat, chest pain, shortness of breath, abdominal pain, dysuria, or rash. She has been eating and drinking as normal. No recent medication change. No recent sick contact.  Denies hematuria, hematochezia, or melena.  Past Medical History  Diagnosis Date  . Osteoarthrosis, unspecified whether generalized or localized, unspecified site   . Gout, unspecified   . Hepatitis, unspecified   . Other chronic sinusitis   . Unspecified chronic bronchitis     sees Dr. Melba Coon, last visit 08/2011, treated for E. Coli- resp. /w Ceftin, Feb. 2013  . Hypertension   . GERD (gastroesophageal reflux disease)     rolaids if needed    Past Surgical History  Procedure Laterality Date  . Tubal ligation    . Vaginal hysterectomy    . Tonsillectomy    . Rotator cuff repair      left  . Carpal tunnel release  09/06/2011    Procedure: CARPAL TUNNEL RELEASE;  Surgeon: Kennieth Rad, MD;  Location: [facility];  Service: Orthopedics;  Laterality: Right;    Family  History  Problem Relation Age of Onset  . Heart disease Father   . ALS Brother     History  Substance Use Topics  . Smoking status: Never Smoker   . Smokeless tobacco: Never Used  . Alcohol Use: Yes     Comment: occassionally    OB History   Grav Para Term Preterm Abortions TAB SAB Ect Mult Living                  Review of Systems  Constitutional:       10 Systems reviewed and all are negative for acute change except as noted in the HPI.     Allergies  Penicillins; Tetanus toxoids; Codeine; and Levofloxacin  Home Medications   Current Outpatient Rx  Name  Route  Sig  Dispense  Refill  . acetaminophen (TYLENOL) 500 MG tablet   Oral   Take 500 mg by mouth every 6 (six) hours as needed. For pain         . albuterol (PROAIR HFA) 108 (90 BASE) MCG/ACT inhaler   Inhalation   Inhale 2 puffs into the lungs every 6 (six) hours as needed.           Marland Kitchen albuterol (PROVENTIL) (2.5 MG/3ML) 0.083% nebulizer solution   Nebulization   Take 2.5 mg by nebulization every 6 (six) hours as needed. For wheezing or shortness of breath         . Ascorbic Acid (VITAMIN C) 500 MG tablet   Oral   Take  500 mg by mouth daily.           . benzonatate (TESSALON) 100 MG capsule   Oral   Take 100 mg by mouth 2 (two) times daily.         Marland Kitchen BLACK COHOSH PO   Oral   Take 1 tablet by mouth daily.           . chlorpheniramine-HYDROcodone (TUSSIONEX) 10-8 MG/5ML LQCR   Oral   Take 5 mLs by mouth every 12 (twelve) hours.         . cholecalciferol (VITAMIN D) 1000 UNITS tablet   Oral   Take 1,000 Units by mouth daily.         Marland Kitchen etodolac (LODINE) 400 MG tablet   Oral   Take 400 mg by mouth daily.           . fexofenadine-pseudoephedrine (ALLEGRA-D 12 HOUR) 60-120 MG per tablet   Oral   Take 1 tablet by mouth 2 (two) times daily.           . Furosemide (LASIX PO)   Oral   Take 2.5 mg by mouth See admin instructions. Takes 1/4 of a 10mg  tablet a few times a week for  swelling/per patient         . HYDROcodone-acetaminophen (LORCET) 10-650 MG per tablet   Oral   Take 0.5-1 tablets by mouth every 6 (six) hours as needed. For pain         . Multiple Vitamin (MULTIVITAMIN) capsule   Oral   Take 1 capsule by mouth daily.           . potassium chloride SA (K-DUR,KLOR-CON) 20 MEQ tablet   Oral   Take 20 mEq by mouth 2 (two) times daily.           . simvastatin (ZOCOR) 20 MG tablet   Oral   Take 20 mg by mouth at bedtime.           . triamterene-hydrochlorothiazide (MAXZIDE-25) 37.5-25 MG per tablet   Oral   Take 1 tablet by mouth daily.           . verapamil (COVERA HS) 240 MG (CO) 24 hr tablet   Oral   Take 240 mg by mouth at bedtime.             BP 145/86  Pulse 95  Temp(Src) 98.2 F (36.8 C) (Oral)  Resp 20  SpO2 97%  Physical Exam  Nursing note and vitals reviewed. Constitutional: She is oriented to person, place, and time. She appears well-developed and well-nourished. No distress.  Awake, alert, nontoxic appearance  HENT:  Head: Atraumatic.  Eyes: Conjunctivae are normal. Right eye exhibits no discharge. Left eye exhibits no discharge.  Neck: Neck supple.  Cardiovascular: Normal rate, regular rhythm and intact distal pulses.   Pulmonary/Chest: Effort normal. No respiratory distress. She exhibits no tenderness.  Abdominal: Soft. Bowel sounds are normal. She exhibits no distension. There is no tenderness. There is no rebound.  Genitourinary:  No CVA tenderness  Chaperone present  Normal rectal tone, no mass,  hemoccult neg  Musculoskeletal: She exhibits no edema and no tenderness (no significant midline spine tenderness).  ROM appears intact, no obvious focal weakness  Negative straight leg raise, normal hip flexion and extension bilaterally  Neurological: She is alert and oriented to person, place, and time.  Mental status and motor strength appears intact  No gross focal neuro deficit. 5 out of 5 strength to  all 4 extremities. Romberg negative. Normal gait.  Skin: No rash noted.  Psychiatric: She has a normal mood and affect.    ED Course  Procedures (including critical care time)   Date: 08/23/2012  Rate: 77  Rhythm: normal sinus rhythm  QRS Axis: normal  Intervals: normal  ST/T Wave abnormalities: normal  Conduction Disutrbances: none  Narrative Interpretation:   Old EKG Reviewed: No significant changes noted     12:01 PM Patient presents complaining of generalized weakness. No obvious finding on exam. She patient maintains normal strength on exam without focal neuro deficit. She is afebrile with stable normal vital sign. No evidence to suggest acute stroke at this time.  No proximal muscle weakness to suggest myositis.  Workup initiated. Care discussed with attending.  2:41 PM Work up has been unremarkable.  No anemia, no UTI, no focal neuro deficits, no focal weakness, normal CBG, normal orthostatic vital sign, electrolytes are unremarkable, ECG unchanged.    Pt able to ambulate, able to tolerates PO.  I recommend f/u with her PCP for further evaluation.  Pt agrees. My attending agrees with plan.  Return precaution discussed.    Labs Reviewed  CBC WITH DIFFERENTIAL - Abnormal; Notable for the following:    RBC 3.79 (*)    HCT 33.5 (*)    All other components within normal limits  BASIC METABOLIC PANEL - Abnormal; Notable for the following:    Potassium 3.3 (*)    Glucose, Bld 126 (*)    GFR calc non Af Amer 65 (*)    GFR calc Af Amer 76 (*)    All other components within normal limits  URINALYSIS, ROUTINE W REFLEX MICROSCOPIC - Abnormal; Notable for the following:    Bilirubin Urine LARGE (*)    Leukocytes, UA SMALL (*)    All other components within normal limits  GLUCOSE, CAPILLARY - Abnormal; Notable for the following:    Glucose-Capillary 108 (*)    All other components within normal limits  URINE MICROSCOPIC-ADD ON - Abnormal; Notable for the following:    Casts  HYALINE CASTS (*)    All other components within normal limits   No results found.   1. Generalized weakness       MDM  BP 130/70  Pulse 75  Temp(Src) 98.2 F (36.8 C) (Oral)  Resp 16  SpO2 100%  I have reviewed nursing notes and vital signs. I personally reviewed the imaging tests through PACS system  I reviewed available ER/hospitalization records thought the EMR         Fayrene Helper, [place] 08/23/12 1511

## 2012-08-23 NOTE — ED Notes (Signed)
Pt c/o lower back pain that radiates down BLE. Pt states she has been taking hydrocodone for pain with little relief. Pt also c/o constipation. LBM was this am. Pt states she is also c/o generalized weakness.

## 2012-08-26 NOTE — ED Provider Notes (Signed)
Medical screening examination/treatment/procedure(s) were conducted as a shared visit with non-physician practitioner(s) and myself.  I personally evaluated the patient during the encounter Pt c/o gen weakness. No fever. No focal pain. No cp or discomfort. Labs.   Suzi Roots, MD 08/26/12 [phone]

## 2012-08-29 ENCOUNTER — Other Ambulatory Visit: Payer: Self-pay | Admitting: Orthopedic Surgery

## 2012-08-29 ENCOUNTER — Ambulatory Visit
Admission: RE | Admit: 2012-08-29 | Discharge: 2012-08-29 | Disposition: A | Discharge status: Home / Self Care | Payer: Medicare Other | Source: Ambulatory Visit | Attending: Orthopedic Surgery | Admitting: Orthopedic Surgery

## 2012-08-29 DIAGNOSIS — M545 Low back pain: Secondary | ICD-10-CM

## 2012-09-01 ENCOUNTER — Encounter: Payer: Self-pay | Admitting: Internal Medicine

## 2012-09-01 ENCOUNTER — Ambulatory Visit (INDEPENDENT_AMBULATORY_CARE_PROVIDER_SITE_OTHER): Payer: Medicare Other | Admitting: Internal Medicine

## 2012-09-01 VITALS — BP 126/78 | HR 88 | Ht 59.0 in | Wt 138.2 lb

## 2012-09-01 DIAGNOSIS — J42 Unspecified chronic bronchitis: Secondary | ICD-10-CM

## 2012-09-01 DIAGNOSIS — J328 Other chronic sinusitis: Secondary | ICD-10-CM

## 2012-09-01 MED ORDER — HYDROCOD POLST-CHLORPHEN POLST 10-8 MG/5ML PO LQCR: 5.0000 mL | Freq: Two times a day (BID) | ORAL | Status: DC | PRN

## 2012-09-01 NOTE — Progress Notes (Signed)
Patient ID: Sheila Cisneros, female    DOB: [birth year], 72 y.o.   MRN: [id number] HPI  07/31/10- 70 yoF followed for chronic bronchitis/ bronchiectasis and hx rhinosinusitis. Never smoker. Last here- January 30, 2010. She remained congested throough the winter, but she put up with it. No events needing medical visits. Pollen now makes cough somewhat more constant. Rarely productive. Denies fever or night sweats. Nebulizer still helps, used about twice daily. Denies  Chest pain or headache.   09/05/10- Chronic bronchitis, bronchiectasis, hx rhinosinusitis Reports increased chest congestion,producitve cough dark yellow.  Denies fever or sweat.She apparently didn't actually get a script for Daliresp last visit. We discussed side effects again. She is trying to exercise- water aerobics and walking.  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  Acute visit. Cough worse in past week. Was put on prednisone 2 weeks ago for hip- didn't help chest-  pred ended yesterday. Occasional hot flash but no sustained fever. Continues Daliresp.  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  She notices a little routine cough but it has been more productive with some darkening sputum and thicker mucus in the last week. No fever, sore throat or sweats. Sinuses feel normal. We had previously tried [employer]- she remembers it kept her awake at night. We talked about trying it again if necessary to suppress her repeated bronchitis.  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  She comes for an acute visit. She has been around a lot of family recently. 2 days ago she thinks she went outdoors too quickly after her bath and may have gotten chills. Next on waking, she noted sore throat no fever no stomach upset. No  sinus congestion but sniffing a lot. Mainly complains of chest congestion and rattle cough productive of thick yellow sputum which has been sometimes brown. She started herself on doxycycline. We discussed her earlier experience with Daliresp. She never took it, although she bought it, because she disliked the printed side effect list. I went over those and my own experience with this drug again today. Has had flu shot this year.  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  Has had flu vaccine. After last visit she tried Daliresp but found it too expensive. She keeps her persistent deep chest rattle and coughs scant gray sputum. Does not really feel badly and is able to exercise. Does sometimes cough until she retches. Does not recognize reflux or sinus pain/drainage. She is using her Flutter device twice daily for pulmonary toilet.  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  CT 08/25/10-  1. Bilateral cylindrical type atelectasis within the lower lobes.  2. Bilateral lower lobe airspace densities, likely the sequela of  inflammation or infection. Suggest short-term follow-up  examination to ensure resolution.  3. Scattered small nodules within the right upper lobe are also  likely related to inflammation or infection. A short-term follow-  up examination in 3 months is recommended to ensure resolution.  Original Report Authenticated By: Rosealee AlbeeAYLOR H. STROUD, M.D.  Had flu vax. Mucomyst is not available - prescribed last visit.  Felt well after last here until took chill first of this week. Sore throat, increased productive cough and congestion. Head stuffy, no HA. Sputum cx  02/26/11- grew E. Coli. Septra was called in and helped, after refill she has now been off x 1 week.  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  At last office visit she stopped Daliresp as ineffective. We had given Ceftin for Escherichia coli on sputum culture. She admits cough has been better but remains productive of yellow sputum off and on. Denies chest pain, fever, chills, enlarged nodes.  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 Recently had flare up last week due to pollen but has gotten better now. She noted mostly nasal stuffiness. Not much cough or wheeze and no recent need for antibiotics.  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  Patient states that cough has been "off and on", depends on weather. pt c/o head congestion today Her chronic cough is very sensitive to weather changes. She asked me to evaluate a bulge on her back, demonstrating scoliosis with protrusion of right posterior rib cage. Relatively little productive cough recently and no fever or blood. CXR 02/04/12-reviewed IMPRESSION:  Stable basilar scarring. Stable cardiomegaly. No active lung  disease.  Original Report Authenticated By: Juline Patch, M.D.   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 FOLLOWS FOR:  States breathing "comes and goes."  Congestion this am with gray mucus and wheezing.denies having cold or acute infection. Low back pain, pending injection.  Review of Systems-see HPI   Constitutional:   No-   weight loss, night sweats, fevers, +chills, fatigue, lassitude. HEENT:   No-  headaches, difficulty swallowing, tooth/dental problems, sore throat,       No-  sneezing, itching, ear ache, +nasal congestion, post nasal drip,  CV:  No-   chest pain, orthopnea, PND, swelling in lower extremities, anasarca, dizziness, palpitations Resp: No- acute  shortness of breath with exertion or at  rest.              +  productive cough,  + non-productive cough,  No-  coughing up of blood.              No- change in color of mucus.  No- wheezing.   Skin: No-   rash or lesions. GI:  No-   heartburn, indigestion, abdominal pain, nausea, vomiting, GU: MS:  No-   joint pain or swelling.  + Asymmetry right posterior ribs Neuro- nothing unusual Psych:  No- change in mood or affect. No depression or anxiety.  No memory loss.   Objective:   Physical Exam   General- Alert, Oriented, Affect-appropriate, Distress- none acute Skin- rash-none, lesions- none, excoriation- none Lymphadenopathy- none Head- atraumatic            Eyes- Gross vision intact, PERRLA, conjunctivae clear secretions            Ears- Hearing, canals-normal            Nose- Clear, no-Septal dev, mucus, polyps, erosion, perforation, .            Throat- Mallampati III , mucosa clear , drainage- none, tonsils- atrophic Neck- flexible , trachea midline, no stridor , thyroid nl, carotid no bruit Chest - symmetrical excursion , unlabored           Heart/CV- RRR , no murmur , no gallop  , no rub, nl s1 s2                           -  JVD- none , edema- none, stasis changes- none, varices- none           Lung-  +loose cough, no wheeze, dullness-none, rub- none, unlabored; room air saturation 96%           Chest wall- right posterior lower thoracic ribs bulge reflecting scoliosis Abd-  Br/ Gen/ Rectal- Not done, not indicated Extrem- cyanosis- none, clubbing, none, atrophy- none, strength- nl Neuro- grossly intact to observation

## 2012-09-01 NOTE — Patient Instructions (Addendum)
Script for tussionex cough syrup refill  Please call as needed

## 2012-09-03 ENCOUNTER — Other Ambulatory Visit: Payer: Self-pay | Admitting: Orthopedic Surgery

## 2012-09-03 DIAGNOSIS — M549 Dorsalgia, unspecified: Secondary | ICD-10-CM

## 2012-09-12 ENCOUNTER — Other Ambulatory Visit: Payer: Self-pay | Admitting: Orthopedic Surgery

## 2012-09-12 DIAGNOSIS — M545 Low back pain: Secondary | ICD-10-CM

## 2012-09-13 NOTE — Assessment & Plan Note (Signed)
Currently in remission 

## 2012-09-13 NOTE — Assessment & Plan Note (Signed)
Significant chronic purulent bronchitis and probable bronchiectasis  plan-refill Tussionex, wait on abx

## 2012-09-16 ENCOUNTER — Ambulatory Visit: Payer: Medicare Other

## 2012-09-16 ENCOUNTER — Ambulatory Visit (INDEPENDENT_AMBULATORY_CARE_PROVIDER_SITE_OTHER): Payer: Medicare Other | Admitting: Emergency Medicine

## 2012-09-16 VITALS — BP 128/70 | HR 70 | Temp 98.3°F | Resp 16 | Ht 59.0 in | Wt 141.0 lb

## 2012-09-16 DIAGNOSIS — M79644 Pain in right finger(s): Secondary | ICD-10-CM

## 2012-09-16 DIAGNOSIS — M79609 Pain in unspecified limb: Secondary | ICD-10-CM

## 2012-09-16 NOTE — Progress Notes (Signed)
[facility] [address] [place] [address] [phone]  Date:  09/16/2012   Name:  Sheila Cisneros   DOB:  [birth year]   MRN:  [id number]  PCP:  Alva Garnet., MD    Chief Complaint: Hand Injury   History of Present Illness:  Sheila Cisneros is a 72 y.o. very pleasant female patient who presents with the following:  Injured when a shelf fell on her right second finger.  Cannot extend her terminal phalange.  No improvement with over the counter medications or other home remedies. Denies other complaint or health concern today.   Patient Active Problem List   Diagnosis Date Noted  . GOUT 03/07/2007  . HYPERTENSION 03/07/2007  . 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 BRONCHITIS, CHRONIC 03/07/2007  . HEPATITIS 03/07/2007  . UNSPECIFIED CELLULITIS AND ABSCESS OF TOE 03/07/2007  . OSTEOARTHRITIS 03/07/2007    Past Medical History  Diagnosis Date  . Osteoarthrosis, unspecified whether generalized or localized, unspecified site   . Gout, unspecified   . Hepatitis, unspecified   . Other chronic sinusitis   . Unspecified chronic bronchitis     sees Dr. Melba Coon, last visit 08/2011, treated for E. Coli- resp. /w Ceftin, Feb. 2013  . Hypertension   . GERD (gastroesophageal reflux disease)     rolaids if needed    Past Surgical History  Procedure Laterality Date  . Tubal ligation    . Vaginal hysterectomy    . Tonsillectomy    . Rotator cuff repair      left  . Carpal tunnel release  09/06/2011    Procedure: CARPAL TUNNEL RELEASE;  Surgeon: Kennieth Rad, MD;  Location[facility];  Service: Orthopedics;  Laterality: Right;    History  Substance Use Topics  . Smoking status: Never Smoker   . Smokeless tobacco: Never Used  . Alcohol Use: Yes     Comment: occassionally    Family History  Problem Relation Age of Onset  . Heart disease Father   . ALS Brother     Allergies  Allergen Reactions  . Penicillins Itching and Rash    REACTION: rash   . Tetanus Toxoids Rash  . Codeine Rash    REACTION: angioedema  . Levofloxacin Rash    Medication list has been reviewed and updated.  Current Outpatient Prescriptions on File Prior to Visit  Medication Sig Dispense Refill  . acetaminophen (TYLENOL) 500 MG tablet Take 500 mg by mouth every 6 (six) hours as needed. For pain      . albuterol (PROAIR HFA) 108 (90 BASE) MCG/ACT inhaler Inhale 2 puffs into the lungs every 6 (six) hours as needed for wheezing or shortness of breath.       Marland Kitchen albuterol (PROVENTIL) (2.5 MG/3ML) 0.083% nebulizer solution Take 2.5 mg by nebulization every 6 (six) hours as needed. For wheezing or shortness of breath      . Ascorbic Acid (VITAMIN C) 500 MG tablet Take 500 mg by mouth daily.        . benzonatate (TESSALON) 100 MG capsule Take 100 mg by mouth 2 (two) times daily.      Marland Kitchen BLACK COHOSH PO Take 1 tablet by mouth daily.        . chlorpheniramine-HYDROcodone (TUSSIONEX) 10-8 MG/5ML LQCR Take 5 mLs by mouth every 12 (twelve) hours as needed.  180 mL  0  . cholecalciferol (VITAMIN D) 1000 UNITS tablet Take 1,000 Units by mouth daily.      Marland Kitchen  etodolac (LODINE) 400 MG tablet Take 400 mg by mouth daily.        . fexofenadine-pseudoephedrine (ALLEGRA-D 12 HOUR) 60-120 MG per tablet Take 1 tablet by mouth 2 (two) times daily.        . Furosemide (LASIX PO) Take by mouth as needed.       Marland Kitchen HYDROcodone-acetaminophen (LORCET) 10-650 MG per tablet Take 0.5-1 tablets by mouth every 6 (six) hours as needed. For pain      . IRON PO Take 1 tablet by mouth daily.      . Multiple Vitamin (MULTIVITAMIN) capsule Take 1 capsule by mouth daily.        . potassium chloride SA (K-DUR,KLOR-CON) 20 MEQ tablet Take 20 mEq by mouth 2 (two) times daily.        . simvastatin (ZOCOR) 20 MG tablet Take 20 mg by mouth at bedtime.        . triamterene-hydrochlorothiazide (MAXZIDE-25) 37.5-25 MG per tablet Take 1 tablet by mouth daily.        . verapamil (COVERA HS) 240 MG (CO) 24 hr tablet  Take 240 mg by mouth at bedtime.         No current facility-administered medications on file prior to visit.    Review of Systems:  As per HPI, otherwise negative.    Physical Examination: Filed Vitals:   09/16/12 1348  BP: 128/70  Pulse: 70  Temp: 98.3 F (36.8 C)  Resp: 16   Filed Vitals:   09/16/12 1348  Height: 4\' 11"  (1.499 m)  Weight: 141 lb (63.957 kg)   Body mass index is 28.46 kg/(m^2). Ideal Body Weight: Weight in (lb) to have BMI = 25: 123.5   GEN: WDWN, NAD, Non-toxic, Alert & Oriented x 3 HEENT: Atraumatic, Normocephalic.  Ears and Nose: No external deformity. EXTR: No clubbing/cyanosis/edema NEURO: Normal gait.  PSYCH: Normally interactive. Conversant. Not depressed or anxious appearing.  Calm demeanor.  RIGHT HAND:  Mallet finger  Assessment and Plan: Severe arthritis Home meds Follow up as needed    Signed,  Phillips Odor, MD   [facility] reading (PRIMARY) by  Dr. Dareen Piano.  Arthritis but no acute injury.

## 2012-09-18 ENCOUNTER — Ambulatory Visit
Admission: RE | Admit: 2012-09-18 | Discharge: 2012-09-18 | Disposition: A | Discharge status: Home / Self Care | Payer: Medicare Other | Source: Ambulatory Visit | Attending: Orthopedic Surgery | Admitting: Orthopedic Surgery

## 2012-09-18 DIAGNOSIS — M545 Low back pain: Secondary | ICD-10-CM

## 2012-09-18 DIAGNOSIS — M549 Dorsalgia, unspecified: Secondary | ICD-10-CM

## 2012-09-18 MED ORDER — METHYLPREDNISOLONE ACETATE 40 MG/ML INJ SUSP (RADIOLOG
120.0000 mg | Freq: Once | INTRAMUSCULAR | Status: AC
  Administered 2012-09-18: 120 mg via EPIDURAL

## 2012-09-18 MED ORDER — IOHEXOL 180 MG/ML  SOLN
1.0000 mL | Freq: Once | INTRAMUSCULAR | Status: AC | PRN
  Administered 2012-09-18: 1 mL via EPIDURAL

## 2012-09-26 ENCOUNTER — Other Ambulatory Visit: Payer: Self-pay | Admitting: Orthopedic Surgery

## 2012-09-26 DIAGNOSIS — M549 Dorsalgia, unspecified: Secondary | ICD-10-CM

## 2012-10-01 ENCOUNTER — Ambulatory Visit
Admission: RE | Admit: 2012-10-01 | Discharge: 2012-10-01 | Disposition: A | Discharge status: Home / Self Care | Payer: Medicare Other | Source: Ambulatory Visit | Attending: Orthopedic Surgery | Admitting: Orthopedic Surgery

## 2012-10-01 DIAGNOSIS — M549 Dorsalgia, unspecified: Secondary | ICD-10-CM

## 2012-10-01 MED ORDER — METHYLPREDNISOLONE ACETATE 40 MG/ML INJ SUSP (RADIOLOG
120.0000 mg | Freq: Once | INTRAMUSCULAR | Status: AC
  Administered 2012-10-01: 120 mg via EPIDURAL

## 2012-10-01 MED ORDER — IOHEXOL 180 MG/ML  SOLN
1.0000 mL | Freq: Once | INTRAMUSCULAR | Status: AC | PRN
  Administered 2012-10-01: 1 mL via EPIDURAL

## 2012-10-21 ENCOUNTER — Other Ambulatory Visit: Payer: Self-pay | Admitting: Orthopedic Surgery

## 2012-10-21 DIAGNOSIS — M549 Dorsalgia, unspecified: Secondary | ICD-10-CM

## 2012-10-22 ENCOUNTER — Ambulatory Visit
Admission: RE | Admit: 2012-10-22 | Discharge: 2012-10-22 | Disposition: A | Discharge status: Home / Self Care | Payer: Medicare Other | Source: Ambulatory Visit | Attending: Orthopedic Surgery | Admitting: Orthopedic Surgery

## 2012-10-22 VITALS — BP 147/74 | HR 66

## 2012-10-22 DIAGNOSIS — M549 Dorsalgia, unspecified: Secondary | ICD-10-CM

## 2012-10-22 MED ORDER — METHYLPREDNISOLONE ACETATE 40 MG/ML INJ SUSP (RADIOLOG
120.0000 mg | Freq: Once | INTRAMUSCULAR | Status: AC
  Administered 2012-10-22: 120 mg via EPIDURAL

## 2012-10-22 MED ORDER — IOHEXOL 180 MG/ML  SOLN
1.0000 mL | Freq: Once | INTRAMUSCULAR | Status: AC | PRN
  Administered 2012-10-22: 1 mL via EPIDURAL

## 2012-10-27 ENCOUNTER — Ambulatory Visit: Payer: Self-pay | Admitting: Nurse Practitioner

## 2012-11-09 ENCOUNTER — Emergency Department (HOSPITAL_COMMUNITY): Payer: Medicare Other

## 2012-11-09 ENCOUNTER — Emergency Department (HOSPITAL_COMMUNITY)
Admission: EM | Admit: 2012-11-09 | Discharge: 2012-11-09 | Disposition: A | Discharge status: Home / Self Care | Payer: Medicare Other | Attending: Emergency Medicine | Admitting: Emergency Medicine

## 2012-11-09 ENCOUNTER — Encounter (HOSPITAL_COMMUNITY): Payer: Self-pay | Admitting: Nurse Practitioner

## 2012-11-09 DIAGNOSIS — M549 Dorsalgia, unspecified: Secondary | ICD-10-CM | POA: Diagnosis present

## 2012-11-09 DIAGNOSIS — M545 Low back pain, unspecified: Secondary | ICD-10-CM | POA: Insufficient documentation

## 2012-11-09 DIAGNOSIS — M159 Polyosteoarthritis, unspecified: Secondary | ICD-10-CM | POA: Insufficient documentation

## 2012-11-09 DIAGNOSIS — I10 Essential (primary) hypertension: Secondary | ICD-10-CM | POA: Insufficient documentation

## 2012-11-09 LAB — CBC
HCT: 32.7 % — ABNORMAL LOW (ref 36.0–46.0)
Hemoglobin: 11.1 g/dL — ABNORMAL LOW (ref 12.0–15.0)
MCH: 31.1 pg (ref 26.0–34.0)
MCHC: 33.9 g/dL (ref 30.0–36.0)
MCV: 91.6 fL (ref 78.0–100.0)
Platelets: 206 K/uL (ref 150–400)
RBC: 3.57 MIL/uL — ABNORMAL LOW (ref 3.87–5.11)
RDW: 13.3 % (ref 11.5–15.5)
WBC: 8.6 K/uL (ref 4.0–10.5)

## 2012-11-09 LAB — BASIC METABOLIC PANEL WITH GFR
CO2: 30 meq/L (ref 19–32)
Calcium: 9.7 mg/dL (ref 8.4–10.5)
Potassium: 3.2 meq/L — ABNORMAL LOW (ref 3.5–5.1)
Sodium: 136 meq/L (ref 135–145)

## 2012-11-09 LAB — POCT I-STAT TROPONIN I: Troponin i, poc: 0 ng/mL (ref 0.00–0.08)

## 2012-11-09 LAB — BASIC METABOLIC PANEL
BUN: 32 mg/dL — ABNORMAL HIGH (ref 6–23)
Chloride: 94 mEq/L — ABNORMAL LOW (ref 96–112)
Creatinine, Ser: 1.01 mg/dL (ref 0.50–1.10)
GFR calc Af Amer: 63 mL/min — ABNORMAL LOW (ref >90)
GFR calc non Af Amer: 55 mL/min — ABNORMAL LOW (ref >90)
Glucose, Bld: 122 mg/dL — ABNORMAL HIGH (ref 70–99)

## 2012-11-09 LAB — PRO B NATRIURETIC PEPTIDE: Pro B Natriuretic peptide (BNP): 47.3 pg/mL (ref 0–125)

## 2012-11-09 MED ORDER — HYDROMORPHONE HCL PF 1 MG/ML IJ SOLN
0.5000 mg | Freq: Once | INTRAMUSCULAR | Status: AC
  Administered 2012-11-09: 0.5 mg via INTRAMUSCULAR
  Filled 2012-11-09: qty 1

## 2012-11-09 MED ORDER — TRAMADOL HCL 50 MG PO TABS: 50.0000 mg | ORAL_TABLET | Freq: Three times a day (TID) | ORAL | Status: DC | PRN

## 2012-11-09 NOTE — ED Notes (Signed)
C/o lower back pain radiating down bilateral legs with swollen feet for past week. Has also felt a little more SOB than normal, using home inhalers with some relief.

## 2012-11-09 NOTE — ED Provider Notes (Signed)
I saw and evaluated the patient, reviewed the resident's note and I agree with the findings and plan.  Pt with h/o chronic low back pain, has had epidurals in the past, sees Dr. Montez Morita, orthopedist and PCP Dr. Renae Gloss for this.  She had an MRI several months ago of lumbar spine.  Pt comes in with lower back pain, no trauma.  No abd pain, soft abd, no masses.  Pt is able to ambulate, sit up well, tenderness noted on exam of lumbar spine.  Pt given analgesic here and feels improved.  Pt encouraged to follow up with Dr. Renae Gloss to discuss pain management options if she feels epidurals and Dr. Montez Morita are not meeting her needs.  Pt doesn't wish surgical options.  No evidecne of cauda equina or sig disk herniation.    Gavin Pound. Oletta Lamas, MD 11/09/12 2031

## 2012-11-09 NOTE — ED Provider Notes (Signed)
CSN: [id number]     Arrival date & time 11/09/12  1626 History     First MD Initiated Contact with Patient 11/09/12 1746     Chief Complaint  Patient presents with  . Back Pain   (Consider location/radiation/quality/duration/timing/severity/associated sxs/prior Treatment) Patient is a 72 y.o. female presenting with back pain. The history is provided by the patient.  Back Pain Location:  Lumbar spine Quality:  Shooting Radiates to:  R posterior upper leg Pain severity:  Severe Pain is:  Same all the time Onset quality:  Gradual Duration: "very long time, more than a year" Timing:  Constant Progression:  Worsening Chronicity:  Chronic Relieved by:  Nothing Worsened by:  Standing and twisting Ineffective treatments:  Being still and NSAIDs Associated symptoms: no abdominal pain, no bladder incontinence, no bowel incontinence, no chest pain, no dysuria, no fever, no headaches, no numbness, no paresthesias, no perianal numbness, no tingling, no weakness and no weight loss     Past Medical History  Diagnosis Date  . Osteoarthrosis, unspecified whether generalized or localized, unspecified site   . Gout, unspecified   . Hepatitis, unspecified   . Other chronic sinusitis   . Unspecified chronic bronchitis     sees Dr. Melba Coon, last visit 08/2011, treated for E. Coli- resp. /w Ceftin, Feb. 2013  . Hypertension   . GERD (gastroesophageal reflux disease)     rolaids if needed   Past Surgical History  Procedure Laterality Date  . Tubal ligation    . Vaginal hysterectomy    . Tonsillectomy    . Rotator cuff repair      left  . Carpal tunnel release  09/06/2011    Procedure: CARPAL TUNNEL RELEASE;  Surgeon: Kennieth Rad, MD;  Location: [facility] OR;  Service: Orthopedics;  Laterality: Right;   Family History  Problem Relation Age of Onset  . Heart disease Father   . ALS Brother    History  Substance Use Topics  . Smoking status: Never Smoker   . Smokeless tobacco: Never Used   . Alcohol Use: Yes     Comment: occassionally   OB History   Grav Para Term Preterm Abortions TAB SAB Ect Mult Living                 Review of Systems  Constitutional: Negative for fever, chills, weight loss, diaphoresis, activity change and appetite change.  HENT: Negative for sore throat, rhinorrhea, sneezing, drooling and trouble swallowing.   Eyes: Negative for discharge and redness.  Respiratory: Negative for cough, chest tightness, shortness of breath, wheezing and stridor.   Cardiovascular: Negative for chest pain and leg swelling.  Gastrointestinal: Negative for nausea, vomiting, abdominal pain, diarrhea, constipation, blood in stool and bowel incontinence.  Genitourinary: Negative for bladder incontinence, dysuria and difficulty urinating.  Musculoskeletal: Positive for back pain. Negative for myalgias and arthralgias.  Skin: Negative for pallor.  Neurological: Negative for dizziness, tingling, syncope, speech difficulty, weakness, light-headedness, numbness, headaches and paresthesias.  Hematological: Negative for adenopathy. Does not bruise/bleed easily.  Psychiatric/Behavioral: Negative for confusion and agitation.    Allergies  Codeine; Penicillins; Tetanus toxoids; and Levofloxacin  Home Medications   Current Outpatient Rx  Name  Route  Sig  Dispense  Refill  . acetaminophen (TYLENOL) 500 MG tablet   Oral   Take 500 mg by mouth every 6 (six) hours as needed. For pain         . albuterol (PROAIR HFA) 108 (90 BASE) MCG/ACT inhaler  Inhalation   Inhale 2 puffs into the lungs every 6 (six) hours as needed for wheezing or shortness of breath.          Marland Kitchen albuterol (PROVENTIL) (2.5 MG/3ML) 0.083% nebulizer solution   Nebulization   Take 2.5 mg by nebulization every 6 (six) hours as needed. For wheezing or shortness of breath         . Ascorbic Acid (VITAMIN C) 500 MG tablet   Oral   Take 500 mg by mouth daily.           . benzonatate (TESSALON) 100 MG  capsule   Oral   Take 100 mg by mouth 2 (two) times daily.         . celecoxib (CELEBREX) 200 MG capsule   Oral   Take 200 mg by mouth 2 (two) times daily.         . chlorpheniramine-HYDROcodone (TUSSIONEX) 10-8 MG/5ML LQCR   Oral   Take 5 mLs by mouth every 12 (twelve) hours as needed.   180 mL   0   . cholecalciferol (VITAMIN D) 1000 UNITS tablet   Oral   Take 1,000 Units by mouth daily.         Marland Kitchen donepezil (ARICEPT) 5 MG tablet   Oral   Take 5 mg by mouth at bedtime.         Marland Kitchen etodolac (LODINE) 400 MG tablet   Oral   Take 400 mg by mouth daily.           . fexofenadine-pseudoephedrine (ALLEGRA-D 12 HOUR) 60-120 MG per tablet   Oral   Take 1 tablet by mouth 2 (two) times daily.           . fish oil-omega-3 fatty acids 1000 MG capsule   Oral   Take 2 g by mouth daily.         . Furosemide (LASIX PO)   Oral   Take by mouth as needed.          . Ginkgo Biloba (GINKOBA PO)   Oral   Take 1 tablet by mouth daily.         Marland Kitchen guaiFENesin (DIABETIC TUSSIN EX) 100 MG/5ML liquid   Oral   Take 200 mg by mouth 3 (three) times daily as needed for cough.         . IRON PO   Oral   Take 1 tablet by mouth daily.         . Lecithin 1200 MG CAPS   Oral   Take 2,400 mg by mouth daily.         . Melatonin 3 MG CAPS   Oral   Take 3 capsules by mouth at bedtime.         . Multiple Vitamin (MULTIVITAMIN) capsule   Oral   Take 1 capsule by mouth daily.           . potassium chloride SA (K-DUR,KLOR-CON) 20 MEQ tablet   Oral   Take 20 mEq by mouth 2 (two) times daily.           . pregabalin (LYRICA) 75 MG capsule   Oral   Take 75 mg by mouth 2 (two) times daily.         . simvastatin (ZOCOR) 20 MG tablet   Oral   Take 20 mg by mouth at bedtime.           Satira Sark Johns Wort 300 MG CAPS   Oral  Take 300 mg by mouth daily.         Marland Kitchen triamterene-hydrochlorothiazide (MAXZIDE-25) 37.5-25 MG per tablet   Oral   Take 1 tablet by mouth  daily.           Marland Kitchen venlafaxine (EFFEXOR) 37.5 MG tablet   Oral   Take 37.5 mg by mouth daily as needed (hot flashes).         . verapamil (COVERA HS) 240 MG (CO) 24 hr tablet   Oral   Take 240 mg by mouth at bedtime.           Marland Kitchen BLACK COHOSH PO   Oral   Take 1 tablet by mouth daily.           . traMADol (ULTRAM) 50 MG tablet   Oral   Take 1 tablet (50 mg total) by mouth every 8 (eight) hours as needed for pain.   15 tablet   0    BP 127/56  Pulse 71  Temp(Src) 98.6 F (37 C) (Oral)  Resp 12  SpO2 99% Physical Exam  Constitutional: She is oriented to person, place, and time. She appears well-developed and well-nourished. No distress.  HENT:  Head: Normocephalic and atraumatic.  Right Ear: External ear normal.  Left Ear: External ear normal.  Eyes: Conjunctivae and EOM are normal. Right eye exhibits no discharge. Left eye exhibits no discharge.  Neck: Normal range of motion. Neck supple. No JVD present.  Cardiovascular: Normal rate, regular rhythm and normal heart sounds.  Exam reveals no gallop and no friction rub.   No murmur heard. Pulmonary/Chest: Effort normal. No stridor. No respiratory distress. She has no wheezes. She has rales (coarse at the bilateral bases). She exhibits no tenderness.  Abdominal: Soft. Bowel sounds are normal. She exhibits no distension. There is no tenderness. There is no rebound and no guarding.  Musculoskeletal: Normal range of motion. She exhibits no edema.       Lumbar back: She exhibits normal range of motion, no tenderness, no bony tenderness and no swelling.  Neurological: She is alert and oriented to person, place, and time. No sensory deficit. GCS eye subscore is 4. GCS verbal subscore is 5. GCS motor subscore is 6. She displays no Babinski's sign on the right side. She displays no Babinski's sign on the left side.  Reflex Scores:      Patellar reflexes are 1+ on the right side and 1+ on the left side. Hip flex/ext strength 5/5  bilaterally Dorsiflexion and plantar flexion strength 5/5 bilaterally EHL strength 5/5 bilaterally  Skin: Skin is warm. No rash noted. She is not diaphoretic.  Psychiatric: She has a normal mood and affect. Her behavior is normal.    ED Course   Procedures (including critical care time)  Labs Reviewed  CBC - Abnormal; Notable for the following:    RBC 3.57 (*)    Hemoglobin 11.1 (*)    HCT 32.7 (*)    All other components within normal limits  BASIC METABOLIC PANEL - Abnormal; Notable for the following:    Potassium 3.2 (*)    Chloride 94 (*)    Glucose, Bld 122 (*)    BUN 32 (*)    GFR calc non Af Amer 55 (*)    GFR calc Af Amer 63 (*)    All other components within normal limits  PRO B NATRIURETIC PEPTIDE  POCT I-STAT TROPONIN I   Dg Chest 2 View  11/09/2012   *RADIOLOGY REPORT*  Clinical Data: Chest and back pain with cough and congestion for 2 weeks.  History of hepatitis and bronchitis.  CHEST - 2 VIEW  Comparison: 02/04/2012.  Findings: There are chronic low lung volumes with associated chronic bibasilar scarring.  No confluent airspace opacity or pleural effusion is demonstrated.  Heart size and mediastinal contours are stable.  A thoracolumbar scoliosis appears stable.  IMPRESSION: Stable chronic basilar lung disease.  No acute cardiopulmonary process.   Original Report Authenticated By: Carey Bullocks, M.D.   Date: 11/10/2012  Rate: 84  Rhythm: normal sinus rhythm  QRS Axis: normal  Intervals: normal  ST/T Wave abnormalities: nonspecific T wave changes  Conduction Disutrbances:none  Narrative Interpretation: NSR  Old EKG Reviewed: unchanged   1. Back pain     MDM  Shrika Maggie Schwalbe 72 y.o. and no history of chronic lower back pain presents with lower back pain. No bowel or bladder incontinence. No paresthesias of the lower extremities. No weakness of the lower extremities. No new trauma or injuries. She's afebrile vital signs are stable. Strength is symmetric and  intact bilaterally the lower extremities. Sensation is intact the bilateral lower extremities. Pain improved with dilaudid. Considering no red flags, no imaging indicated, also as patient had MRI recently. NO narcotics given at discharge as patient should follow up with PCP or consider pain management follow up for her chronic pain. Gave return precautions. Gave rx for tramadol.  Labs, imaging and ecg reviewed. I discussed this patient's care with my attending, Dr. Oletta Lamas.  Sena Hitch, MD 11/10/12 0030

## 2012-11-20 ENCOUNTER — Ambulatory Visit (INDEPENDENT_AMBULATORY_CARE_PROVIDER_SITE_OTHER): Payer: Medicare Other | Admitting: Internal Medicine

## 2012-11-20 ENCOUNTER — Encounter: Payer: Self-pay | Admitting: Internal Medicine

## 2012-11-20 VITALS — BP 108/60 | HR 83 | Ht 59.0 in | Wt 141.6 lb

## 2012-11-20 DIAGNOSIS — J440 Chronic obstructive pulmonary disease with acute lower respiratory infection: Secondary | ICD-10-CM

## 2012-11-20 MED ORDER — CLARITHROMYCIN 500 MG PO TABS: ORAL_TABLET | ORAL | Status: DC

## 2012-11-20 NOTE — Patient Instructions (Addendum)
Script for biaxin antibiotic sent. It may interfere with your simvastatin- So Drop Off Simvastatin till you finish the Biaxin                                                    It may also interfere with the Verapamil, so notice if you get too light headed, or develop palpitations.  Please be very careful with your back pain medicines- have your orthopedic doctor help you decide how much to take.

## 2012-11-20 NOTE — Progress Notes (Signed)
Patient ID: Sheila Cisneros, female    DOB: [birth year], 72 y.o.   MRN: [id number] HPI  07/31/10- 70 yoF followed for chronic bronchitis/ bronchiectasis and hx rhinosinusitis. Never smoker. Last here- January 30, 2010. She remained congested throough the winter, but she put up with it. No events needing medical visits. Pollen now makes cough somewhat more constant. Rarely productive. Denies fever or night sweats. Nebulizer still helps, used about twice daily. Denies  Chest pain or headache.   09/05/10- Chronic bronchitis, bronchiectasis, hx rhinosinusitis Reports increased chest congestion,producitve cough dark yellow.  Denies fever or sweat.She apparently didn't actually get a script for Daliresp last visit. We discussed side effects again. She is trying to exercise- water aerobics and walking.  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  Acute visit. Cough worse in past week. Was put on prednisone 2 weeks ago for hip- didn't help chest-  pred ended yesterday. Occasional hot flash but no sustained fever. Continues Daliresp.  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  She notices a little routine cough but it has been more productive with some darkening sputum and thicker mucus in the last week. No fever, sore throat or sweats. Sinuses feel normal. We had previously tried [employer]- she remembers it kept her awake at night. We talked about trying it again if necessary to suppress her repeated bronchitis.  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  She comes for an acute visit. She has been around a lot of family recently. 2 days ago she thinks she went outdoors too quickly after her bath and may have gotten chills. Next on waking, she noted sore throat no fever no stomach upset. No  sinus congestion but sniffing a lot. Mainly complains of chest congestion and rattle cough productive of thick yellow sputum which has been sometimes brown. She started herself on doxycycline. We discussed her earlier experience with Daliresp. She never took it, although she bought it, because she disliked the printed side effect list. I went over those and my own experience with this drug again today. Has had flu shot this year.  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  Has had flu vaccine. After last visit she tried Daliresp but found it too expensive. She keeps her persistent deep chest rattle and coughs scant gray sputum. Does not really feel badly and is able to exercise. Does sometimes cough until she retches. Does not recognize reflux or sinus pain/drainage. She is using her Flutter device twice daily for pulmonary toilet.  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  CT 08/25/10-  1. Bilateral cylindrical type atelectasis within the lower lobes.  2. Bilateral lower lobe airspace densities, likely the sequela of  inflammation or infection. Suggest short-term follow-up  examination to ensure resolution.  3. Scattered small nodules within the right upper lobe are also  likely related to inflammation or infection. A short-term follow-  up examination in 3 months is recommended to ensure resolution.  Original Report Authenticated By: Rosealee AlbeeAYLOR H. STROUD, M.D.  Had flu vax. Mucomyst is not available - prescribed last visit.  Felt well after last here until took chill first of this week. Sore throat, increased productive cough and congestion. Head stuffy, no HA. Sputum cx  02/26/11- grew E. Coli. Septra was called in and helped, after refill she has now been off x 1 week.  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  At last office visit she stopped Daliresp as ineffective. We had given Ceftin for Escherichia coli on sputum culture. She admits cough has been better but remains productive of yellow sputum off and on. Denies chest pain, fever, chills, enlarged nodes.  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 Recently had flare up last week due to pollen but has gotten better now. She noted mostly nasal stuffiness. Not much cough or wheeze and no recent need for antibiotics.  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  Patient states that cough has been "off and on", depends on weather. pt c/o head congestion today Her chronic cough is very sensitive to weather changes. She asked me to evaluate a bulge on her back, demonstrating scoliosis with protrusion of right posterior rib cage. Relatively little productive cough recently and no fever or blood. CXR 02/04/12-reviewed IMPRESSION:  Stable basilar scarring. Stable cardiomegaly. No active lung  disease.  Original Report Authenticated By: Juline Patch, M.D.   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 FOLLOWS FOR:  States breathing "comes and goes."  Congestion this am with gray mucus and wheezing.denies having cold or acute infection. Low back pain, pending injection.  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  C/o back pain and has taken pain med for this.  Prod cough with dark mucus since yesterday.  No f/c/s, wheezing, chest tightness, or chest pain.   CXR 11/09/12 IMPRESSION:  Stable chronic basilar lung disease. No acute cardiopulmonary  process.  Original Report Authenticated By:  Carey Bullocks, M.D.  ROS- see HPI   Constitutional:   No-   weight loss, night sweats, fevers, chills, fatigue, lassitude. HEENT:   No-  headaches, difficulty swallowing, tooth/dental problems, sore throat,       No-  sneezing, itching, ear ache, +nasal congestion, post nasal drip,  CV:  No-   chest pain, orthopnea, PND, swelling in lower extremities, anasarca, dizziness, palpitations Resp: No- acute  shortness of breath with exertion or at rest.              +  productive cough,  + non-productive cough,  No-  coughing up of blood.              +change in color of mucus.  No- wheezing.   Skin: No-   rash or lesions. GI:  No-   heartburn, indigestion, abdominal pain, nausea, vomiting, GU: MS:  No-   joint pain or swelling.  + Asymmetry right posterior ribs Neuro- nothing unusual Psych:  No- change in mood or affect. No depression or anxiety.  No memory loss.   Objective:   Physical Exam   General- +drowsy from pain medication for back, Alert, Oriented, Affect-appropriate, Distress- none acute Skin- rash-none, lesions- none, excoriation- none Lymphadenopathy- none Head- atraumatic            Eyes- Gross vision intact, PERRLA, conjunctivae clear secretions            Ears- Hearing, canals-normal  Nose- Clear, no-Septal dev, mucus, polyps, erosion, perforation, .            Throat- Mallampati III , mucosa clear , drainage- none, tonsils- atrophic Neck- flexible , trachea midline, no stridor , thyroid nl, carotid no bruit Chest - symmetrical excursion , unlabored           Heart/CV- RRR , no murmur , no gallop  , no rub, nl s1 s2                           - JVD- none , edema- none, stasis changes- none, varices- none           Lung-  +coarse upper airway rhonchi, no wheeze, dullness-none, rub- none, unlabored; room air saturation 96%           Chest wall- right posterior lower thoracic ribs bulge reflecting scoliosis Abd-  Br/ Gen/ Rectal- Not done, not indicated Extrem-  cyanosis- none, clubbing, none, atrophy- none, strength- nl Neuro- grossly intact to observation

## 2012-11-21 ENCOUNTER — Telehealth: Payer: Self-pay | Admitting: Neurology

## 2012-11-24 NOTE — Telephone Encounter (Signed)
I called pt and asked her about the twitching she is having. We have not seen her for this.  She stated she was better.   I told her to see pcp initially and if needs neurologist then to have them call us back.   She verbalized understanding.

## 2012-11-25 ENCOUNTER — Telehealth: Payer: Self-pay | Admitting: Neurology

## 2012-11-25 NOTE — Telephone Encounter (Signed)
I spoke to pt yesterday and she was better but now its back.  Wanted appt.  I told her to f/u with pcp, we have not seen her for this and be evaluated there.  If neuro issue then can refer you back to [place].  She was ok with this.

## 2012-11-26 ENCOUNTER — Emergency Department (HOSPITAL_COMMUNITY): Payer: Medicare Other

## 2012-11-26 ENCOUNTER — Encounter (HOSPITAL_COMMUNITY): Payer: Self-pay | Admitting: Emergency Medicine

## 2012-11-26 ENCOUNTER — Emergency Department (HOSPITAL_COMMUNITY)
Admission: EM | Admit: 2012-11-26 | Discharge: 2012-11-26 | Disposition: A | Discharge status: Home / Self Care | Payer: Medicare Other | Attending: Emergency Medicine | Admitting: Emergency Medicine

## 2012-11-26 DIAGNOSIS — Z8719 Personal history of other diseases of the digestive system: Secondary | ICD-10-CM | POA: Insufficient documentation

## 2012-11-26 DIAGNOSIS — Z8709 Personal history of other diseases of the respiratory system: Secondary | ICD-10-CM | POA: Insufficient documentation

## 2012-11-26 DIAGNOSIS — Y929 Unspecified place or not applicable: Secondary | ICD-10-CM | POA: Insufficient documentation

## 2012-11-26 DIAGNOSIS — S300XXA Contusion of lower back and pelvis, initial encounter: Secondary | ICD-10-CM

## 2012-11-26 DIAGNOSIS — Z8639 Personal history of other endocrine, nutritional and metabolic disease: Secondary | ICD-10-CM | POA: Insufficient documentation

## 2012-11-26 DIAGNOSIS — Z792 Long term (current) use of antibiotics: Secondary | ICD-10-CM | POA: Insufficient documentation

## 2012-11-26 DIAGNOSIS — Z880 Allergy status to penicillin: Secondary | ICD-10-CM | POA: Insufficient documentation

## 2012-11-26 DIAGNOSIS — Z791 Long term (current) use of non-steroidal anti-inflammatories (NSAID): Secondary | ICD-10-CM | POA: Insufficient documentation

## 2012-11-26 DIAGNOSIS — Z862 Personal history of diseases of the blood and blood-forming organs and certain disorders involving the immune mechanism: Secondary | ICD-10-CM | POA: Insufficient documentation

## 2012-11-26 DIAGNOSIS — M1990 Unspecified osteoarthritis, unspecified site: Secondary | ICD-10-CM | POA: Insufficient documentation

## 2012-11-26 DIAGNOSIS — S20229A Contusion of unspecified back wall of thorax, initial encounter: Secondary | ICD-10-CM | POA: Insufficient documentation

## 2012-11-26 DIAGNOSIS — X58XXXA Exposure to other specified factors, initial encounter: Secondary | ICD-10-CM | POA: Insufficient documentation

## 2012-11-26 DIAGNOSIS — Y939 Activity, unspecified: Secondary | ICD-10-CM | POA: Insufficient documentation

## 2012-11-26 DIAGNOSIS — I10 Essential (primary) hypertension: Secondary | ICD-10-CM | POA: Insufficient documentation

## 2012-11-26 DIAGNOSIS — Z79899 Other long term (current) drug therapy: Secondary | ICD-10-CM | POA: Insufficient documentation

## 2012-11-26 LAB — URINALYSIS, ROUTINE W REFLEX MICROSCOPIC
Glucose, UA: NEGATIVE mg/dL
Ketones, ur: NEGATIVE mg/dL
Nitrite: NEGATIVE
Protein, ur: NEGATIVE mg/dL
Urobilinogen, UA: 0.2 mg/dL (ref 0.0–1.0)

## 2012-11-26 LAB — CBC WITH DIFFERENTIAL/PLATELET
Basophils Absolute: 0.1 10*3/uL (ref 0.0–0.1)
Eosinophils Absolute: 0.4 10*3/uL (ref 0.0–0.7)
Lymphocytes Relative: 31 % (ref 12–46)
Lymphs Abs: 4 10*3/uL (ref 0.7–4.0)
Neutrophils Relative %: 59 % (ref 43–77)
Platelets: 322 10*3/uL (ref 150–400)
RBC: 3.63 MIL/uL — ABNORMAL LOW (ref 3.87–5.11)
WBC: 13.2 10*3/uL — ABNORMAL HIGH (ref 4.0–10.5)

## 2012-11-26 LAB — COMPREHENSIVE METABOLIC PANEL
ALT: 22 U/L (ref 0–35)
AST: 24 U/L (ref 0–37)
Alkaline Phosphatase: 83 U/L (ref 39–117)
CO2: 31 mEq/L (ref 19–32)
GFR calc Af Amer: 80 mL/min — ABNORMAL LOW (ref >90)
Glucose, Bld: 108 mg/dL — ABNORMAL HIGH (ref 70–99)
Potassium: 3.1 mEq/L — ABNORMAL LOW (ref 3.5–5.1)
Sodium: 131 mEq/L — ABNORMAL LOW (ref 135–145)
Total Protein: 6.4 g/dL (ref 6.0–8.3)

## 2012-11-26 LAB — GLUCOSE, CAPILLARY: Glucose-Capillary: 113 mg/dL — ABNORMAL HIGH (ref 70–99)

## 2012-11-26 LAB — URINE MICROSCOPIC-ADD ON

## 2012-11-26 NOTE — ED Notes (Addendum)
Pt c/o generalized weakness x1 week and unsteadiness.  Pt reports confusion x2 days.  Denies hx of stroke. Pt alert and oriented at this time.

## 2012-11-26 NOTE — ED Notes (Signed)
EKG old and new given to EDP, Estell Harpin, MD.

## 2012-11-26 NOTE — ED Notes (Signed)
Pt. On cardiac monitor. 

## 2012-11-26 NOTE — ED Provider Notes (Signed)
CSN: [id number]     Arrival date & time 11/26/12  1855 History     First MD Initiated Contact with Patient 11/26/12 1914     Chief Complaint  Patient presents with  . Weakness   (Consider location/radiation/quality/duration/timing/severity/associated sxs/prior Treatment) Patient is a 72 y.o. female presenting with weakness. The history is provided by the patient (the pt states she fell and hurt her back.  she has scoliosis).  Weakness This is a recurrent problem. The current episode started 2 days ago. The problem occurs rarely. The problem has not changed since onset.Pertinent negatives include no chest pain, no abdominal pain and no headaches. The symptoms are aggravated by bending. Nothing relieves the symptoms.    Past Medical History  Diagnosis Date  . Osteoarthrosis, unspecified whether generalized or localized, unspecified site   . Gout, unspecified   . Hepatitis, unspecified   . Other chronic sinusitis   . Unspecified chronic bronchitis     sees Dr. Melba Coon, last visit 08/2011, treated for E. Coli- resp. /w Ceftin, Feb. 2013  . Hypertension   . GERD (gastroesophageal reflux disease)     rolaids if needed   Past Surgical History  Procedure Laterality Date  . Tubal ligation    . Vaginal hysterectomy    . Tonsillectomy    . Rotator cuff repair      left  . Carpal tunnel release  09/06/2011    Procedure: CARPAL TUNNEL RELEASE;  Surgeon: Kennieth Rad, MD;  Location: [facility] OR;  Service: Orthopedics;  Laterality: Right;   Family History  Problem Relation Age of Onset  . Heart disease Father   . ALS Brother    History  Substance Use Topics  . Smoking status: Never Smoker   . Smokeless tobacco: Never Used  . Alcohol Use: Yes     Comment: occassionally   OB History   Grav Para Term Preterm Abortions TAB SAB Ect Mult Living                 Review of Systems  Constitutional: Negative for appetite change and fatigue.  HENT: Negative for congestion, sinus pressure  and ear discharge.   Eyes: Negative for discharge.  Respiratory: Negative for cough.   Cardiovascular: Negative for chest pain.  Gastrointestinal: Negative for abdominal pain and diarrhea.  Genitourinary: Negative for frequency and hematuria.  Musculoskeletal: Positive for back pain.  Skin: Negative for rash.  Neurological: Positive for weakness. Negative for seizures and headaches.  Psychiatric/Behavioral: Negative for hallucinations.    Allergies  Codeine; Penicillins; Tetanus toxoids; and Levofloxacin  Home Medications   Current Outpatient Rx  Name  Route  Sig  Dispense  Refill  . acetaminophen (TYLENOL) 500 MG tablet   Oral   Take 500 mg by mouth every 6 (six) hours as needed. For pain         . albuterol (PROAIR HFA) 108 (90 BASE) MCG/ACT inhaler   Inhalation   Inhale 2 puffs into the lungs every 6 (six) hours as needed for wheezing or shortness of breath.          Marland Kitchen albuterol (PROVENTIL) (2.5 MG/3ML) 0.083% nebulizer solution   Nebulization   Take 2.5 mg by nebulization every 6 (six) hours as needed. For wheezing or shortness of breath         . Ascorbic Acid (VITAMIN C) 500 MG tablet   Oral   Take 500 mg by mouth daily.           Marland Kitchen  benzonatate (TESSALON) 100 MG capsule   Oral   Take 100 mg by mouth 2 (two) times daily.         Marland Kitchen BLACK COHOSH PO   Oral   Take 1 tablet by mouth daily.           . chlorpheniramine-HYDROcodone (TUSSIONEX) 10-8 MG/5ML LQCR   Oral   Take 5 mLs by mouth every 12 (twelve) hours as needed.   180 mL   0   . cholecalciferol (VITAMIN D) 1000 UNITS tablet   Oral   Take 1,000 Units by mouth daily.         . clarithromycin (BIAXIN) 500 MG tablet      1 twice daily after meals   20 tablet   0   . donepezil (ARICEPT) 5 MG tablet   Oral   Take 5 mg by mouth at bedtime.         Marland Kitchen etodolac (LODINE) 400 MG tablet   Oral   Take 400 mg by mouth daily.           . fexofenadine-pseudoephedrine (ALLEGRA-D 12 HOUR)  60-120 MG per tablet   Oral   Take 1 tablet by mouth 2 (two) times daily.           . fish oil-omega-3 fatty acids 1000 MG capsule   Oral   Take 2 g by mouth daily.         . furosemide (LASIX) 40 MG tablet   Oral   Take 40 mg by mouth daily.         Marland Kitchen HYDROcodone-acetaminophen (NORCO/VICODIN) 5-325 MG per tablet   Oral   Take 1 tablet by mouth as needed for pain.         . IRON PO   Oral   Take 1 tablet by mouth daily.         . Lecithin 1200 MG CAPS   Oral   Take 2,400 mg by mouth daily.         . Multiple Vitamin (MULTIVITAMIN) capsule   Oral   Take 1 capsule by mouth daily.           . potassium chloride SA (K-DUR,KLOR-CON) 20 MEQ tablet   Oral   Take 20 mEq by mouth 2 (two) times daily.           . simvastatin (ZOCOR) 20 MG tablet   Oral   Take 20 mg by mouth at bedtime.           . St Johns Wort 300 MG CAPS   Oral   Take 300 mg by mouth daily.         . traMADol (ULTRAM) 50 MG tablet   Oral   Take 1 tablet (50 mg total) by mouth every 8 (eight) hours as needed for pain.   15 tablet   0   . triamterene-hydrochlorothiazide (MAXZIDE-25) 37.5-25 MG per tablet   Oral   Take 1 tablet by mouth daily.           Marland Kitchen venlafaxine (EFFEXOR) 37.5 MG tablet   Oral   Take 37.5 mg by mouth daily as needed (hot flashes).         . verapamil (COVERA HS) 240 MG (CO) 24 hr tablet   Oral   Take 240 mg by mouth at bedtime.            BP 119/56  Pulse 69  Temp(Src) 98.6 F (37 C) (Oral)  Resp  14  SpO2 100% Physical Exam  Constitutional: She is oriented to person, place, and time. She appears well-developed.  HENT:  Head: Normocephalic.  Eyes: Conjunctivae and EOM are normal. No scleral icterus.  Neck: Neck supple. No thyromegaly present.  Cardiovascular: Normal rate and regular rhythm.  Exam reveals no gallop and no friction rub.   No murmur heard. Pulmonary/Chest: No stridor. She has no wheezes. She has no rales. She exhibits no  tenderness.  Abdominal: She exhibits no distension. There is no tenderness. There is no rebound.  Musculoskeletal: Normal range of motion. She exhibits no edema.  Tender lumbar spine  Lymphadenopathy:    She has no cervical adenopathy.  Neurological: She is oriented to person, place, and time. Coordination normal.  Skin: No rash noted. No erythema.  Psychiatric: She has a normal mood and affect. Her behavior is normal.    ED Course   Procedures (including critical care time)  Labs Reviewed  CBC WITH DIFFERENTIAL - Abnormal; Notable for the following:    WBC 13.2 (*)    RBC 3.63 (*)    Hemoglobin 11.5 (*)    HCT 33.4 (*)    All other components within normal limits  COMPREHENSIVE METABOLIC PANEL - Abnormal; Notable for the following:    Sodium 131 (*)    Potassium 3.1 (*)    Chloride 90 (*)    Glucose, Bld 108 (*)    Albumin 2.7 (*)    Total Bilirubin 0.1 (*)    GFR calc non Af Amer 69 (*)    GFR calc Af Amer 80 (*)    All other components within normal limits  GLUCOSE, CAPILLARY - Abnormal; Notable for the following:    Glucose-Capillary 113 (*)    All other components within normal limits  URINALYSIS, ROUTINE W REFLEX MICROSCOPIC - Abnormal; Notable for the following:    Bilirubin Urine MODERATE (*)    Leukocytes, UA SMALL (*)    All other components within normal limits  URINE MICROSCOPIC-ADD ON   Dg Lumbar Spine Complete  11/26/2012   *RADIOLOGY REPORT*  Clinical Data: Low back pain.  LUMBAR SPINE - COMPLETE 4+ VIEW  Comparison: Radiographs 08/29/2012.  MRI 09/18/2012.  Findings: Numbering is based on prior MRI.  There is a transitional largely lumbarized S1 segment. There is a stable degenerative anterolisthesis at L5-S1 with associated advanced disc space loss. Disc space loss and facet disease are noted throughout the lumbar spine associated with a biconvex scoliosis.  There is no evidence of acute fracture or pars defect.  IMPRESSION: Stable thoracolumbar scoliosis  with degenerative anterolisthesis at L5-S1.  S1 is transitional.  No acute findings identified.   Original Report Authenticated By: Carey Bullocks, M.D.   1. Lumbar contusion, initial encounter     MDM    Benny Lennert, MD 11/26/12 2122

## 2012-12-02 ENCOUNTER — Encounter: Payer: Self-pay | Admitting: Internal Medicine

## 2012-12-02 NOTE — Assessment & Plan Note (Signed)
Not known to have had recurrent aspiration or severe pneumonia. Consider workup for CF, immotile cilia, etc Plan- sputum cultures, biaxin

## 2012-12-04 LAB — HM DIABETES EYE EXAM

## 2012-12-09 ENCOUNTER — Inpatient Hospital Stay (HOSPITAL_COMMUNITY)
Admission: EM | Admit: 2012-12-09 | Discharge: 2012-12-14 | DRG: 192 | Disposition: A | Discharge status: Home Health | Payer: Medicare Other | Attending: Internal Medicine | Admitting: Internal Medicine

## 2012-12-09 ENCOUNTER — Emergency Department (HOSPITAL_COMMUNITY): Payer: Medicare Other

## 2012-12-09 ENCOUNTER — Encounter (HOSPITAL_COMMUNITY): Payer: Self-pay | Admitting: Emergency Medicine

## 2012-12-09 DIAGNOSIS — M4120 Other idiopathic scoliosis, site unspecified: Secondary | ICD-10-CM | POA: Diagnosis present

## 2012-12-09 DIAGNOSIS — R739 Hyperglycemia, unspecified: Secondary | ICD-10-CM | POA: Diagnosis present

## 2012-12-09 DIAGNOSIS — M549 Dorsalgia, unspecified: Secondary | ICD-10-CM | POA: Diagnosis present

## 2012-12-09 DIAGNOSIS — M1990 Unspecified osteoarthritis, unspecified site: Secondary | ICD-10-CM | POA: Diagnosis present

## 2012-12-09 DIAGNOSIS — F0150 Vascular dementia without behavioral disturbance: Secondary | ICD-10-CM | POA: Diagnosis present

## 2012-12-09 DIAGNOSIS — F1010 Alcohol abuse, uncomplicated: Secondary | ICD-10-CM | POA: Diagnosis present

## 2012-12-09 DIAGNOSIS — K219 Gastro-esophageal reflux disease without esophagitis: Secondary | ICD-10-CM | POA: Diagnosis present

## 2012-12-09 DIAGNOSIS — J471 Bronchiectasis with (acute) exacerbation: Principal | ICD-10-CM | POA: Diagnosis present

## 2012-12-09 DIAGNOSIS — R7309 Other abnormal glucose: Secondary | ICD-10-CM

## 2012-12-09 DIAGNOSIS — F29 Unspecified psychosis not due to a substance or known physiological condition: Secondary | ICD-10-CM | POA: Diagnosis present

## 2012-12-09 DIAGNOSIS — G309 Alzheimer's disease, unspecified: Secondary | ICD-10-CM | POA: Diagnosis present

## 2012-12-09 DIAGNOSIS — D72829 Elevated white blood cell count, unspecified: Secondary | ICD-10-CM | POA: Diagnosis present

## 2012-12-09 DIAGNOSIS — J440 Chronic obstructive pulmonary disease with acute lower respiratory infection: Secondary | ICD-10-CM

## 2012-12-09 DIAGNOSIS — F0280 Dementia in other diseases classified elsewhere without behavioral disturbance: Secondary | ICD-10-CM | POA: Diagnosis present

## 2012-12-09 DIAGNOSIS — G9340 Encephalopathy, unspecified: Secondary | ICD-10-CM | POA: Diagnosis present

## 2012-12-09 DIAGNOSIS — R410 Disorientation, unspecified: Secondary | ICD-10-CM

## 2012-12-09 DIAGNOSIS — J479 Bronchiectasis, uncomplicated: Secondary | ICD-10-CM | POA: Diagnosis present

## 2012-12-09 DIAGNOSIS — I10 Essential (primary) hypertension: Secondary | ICD-10-CM | POA: Diagnosis present

## 2012-12-09 DIAGNOSIS — R4182 Altered mental status, unspecified: Secondary | ICD-10-CM

## 2012-12-09 DIAGNOSIS — J42 Unspecified chronic bronchitis: Secondary | ICD-10-CM | POA: Diagnosis present

## 2012-12-09 DIAGNOSIS — M109 Gout, unspecified: Secondary | ICD-10-CM | POA: Diagnosis present

## 2012-12-09 DIAGNOSIS — K759 Inflammatory liver disease, unspecified: Secondary | ICD-10-CM | POA: Diagnosis present

## 2012-12-09 DIAGNOSIS — E860 Dehydration: Secondary | ICD-10-CM | POA: Diagnosis present

## 2012-12-09 DIAGNOSIS — M545 Low back pain, unspecified: Secondary | ICD-10-CM | POA: Diagnosis present

## 2012-12-09 DIAGNOSIS — W19XXXA Unspecified fall, initial encounter: Secondary | ICD-10-CM | POA: Diagnosis present

## 2012-12-09 HISTORY — DX: Pneumonia, unspecified organism: J18.9

## 2012-12-09 HISTORY — DX: Low back pain: M54.5

## 2012-12-09 HISTORY — DX: Low back pain, unspecified: M54.50

## 2012-12-09 HISTORY — DX: Altered mental status, unspecified: R41.82

## 2012-12-09 HISTORY — DX: Personal history of other medical treatment: Z92.89

## 2012-12-09 HISTORY — DX: Unspecified osteoarthritis, unspecified site: M19.90

## 2012-12-09 HISTORY — DX: Migraine, unspecified, not intractable, without status migrainosus: G43.909

## 2012-12-09 HISTORY — DX: Shortness of breath: R06.02

## 2012-12-09 HISTORY — DX: Chronic obstructive pulmonary disease, unspecified: J44.9

## 2012-12-09 HISTORY — DX: Other chronic pain: G89.29

## 2012-12-09 LAB — GLUCOSE, CAPILLARY
Glucose-Capillary: 251 mg/dL — ABNORMAL HIGH (ref 70–99)
Glucose-Capillary: 126 mg/dL — ABNORMAL HIGH (ref 70–99)

## 2012-12-09 LAB — COMPREHENSIVE METABOLIC PANEL
ALT: 22 U/L (ref 0–35)
AST: 17 U/L (ref 0–37)
Albumin: 3.1 g/dL — ABNORMAL LOW (ref 3.5–5.2)
Alkaline Phosphatase: 85 U/L (ref 39–117)
BUN: 41 mg/dL — ABNORMAL HIGH (ref 6–23)
CO2: 25 mEq/L (ref 19–32)
Calcium: 9.8 mg/dL (ref 8.4–10.5)
Chloride: 104 mEq/L (ref 96–112)
Creatinine, Ser: 1.06 mg/dL (ref 0.50–1.10)
GFR calc Af Amer: 60 mL/min — ABNORMAL LOW (ref >90)
GFR calc non Af Amer: 52 mL/min — ABNORMAL LOW (ref >90)
Glucose, Bld: 239 mg/dL — ABNORMAL HIGH (ref 70–99)
Potassium: 3.5 mEq/L (ref 3.5–5.1)
Sodium: 141 mEq/L (ref 135–145)
Total Bilirubin: 0.1 mg/dL — ABNORMAL LOW (ref 0.3–1.2)
Total Protein: 7.2 g/dL (ref 6.0–8.3)

## 2012-12-09 LAB — URINALYSIS, ROUTINE W REFLEX MICROSCOPIC
Bilirubin Urine: NEGATIVE
Hgb urine dipstick: NEGATIVE
Nitrite: NEGATIVE
Specific Gravity, Urine: 1.022 (ref 1.005–1.030)
Urobilinogen, UA: 0.2 mg/dL (ref 0.0–1.0)
pH: 6.5 (ref 5.0–8.0)

## 2012-12-09 LAB — CBC WITH DIFFERENTIAL/PLATELET
Basophils Absolute: 0 10*3/uL (ref 0.0–0.1)
Basophils Relative: 0 % (ref 0–1)
Eosinophils Absolute: 0 10*3/uL (ref 0.0–0.7)
Eosinophils Relative: 0 % (ref 0–5)
HCT: 34.9 % — ABNORMAL LOW (ref 36.0–46.0)
Hemoglobin: 12.2 g/dL (ref 12.0–15.0)
Lymphocytes Relative: 11 % — ABNORMAL LOW (ref 12–46)
Lymphs Abs: 1.4 10*3/uL (ref 0.7–4.0)
MCH: 31.9 pg (ref 26.0–34.0)
MCHC: 35 g/dL (ref 30.0–36.0)
MCV: 91.4 fL (ref 78.0–100.0)
Monocytes Absolute: 0.1 10*3/uL (ref 0.1–1.0)
Monocytes Relative: 0 % — ABNORMAL LOW (ref 3–12)
Neutro Abs: 11.7 10*3/uL — ABNORMAL HIGH (ref 1.7–7.7)
Neutrophils Relative %: 89 % — ABNORMAL HIGH (ref 43–77)
Platelets: 273 10*3/uL (ref 150–400)
RBC: 3.82 MIL/uL — ABNORMAL LOW (ref 3.87–5.11)
RDW: 13.4 % (ref 11.5–15.5)
WBC: 13.1 10*3/uL — ABNORMAL HIGH (ref 4.0–10.5)

## 2012-12-09 LAB — RAPID URINE DRUG SCREEN, HOSP PERFORMED
Amphetamines: NOT DETECTED
Barbiturates: NOT DETECTED
Benzodiazepines: POSITIVE — AB

## 2012-12-09 LAB — CBC
Hemoglobin: 12.2 g/dL (ref 12.0–15.0)
MCH: 31.9 pg (ref 26.0–34.0)
MCHC: 35.1 g/dL (ref 30.0–36.0)
MCV: 91.1 fL (ref 78.0–100.0)
RBC: 3.82 MIL/uL — ABNORMAL LOW (ref 3.87–5.11)

## 2012-12-09 LAB — URINE MICROSCOPIC-ADD ON

## 2012-12-09 LAB — TROPONIN I: Troponin I: <0.3 ng/mL (ref <0.30)

## 2012-12-09 LAB — LACTIC ACID, PLASMA: Lactic Acid, Venous: 1.2 mmol/L (ref 0.5–2.2)

## 2012-12-09 LAB — ETHANOL: Alcohol, Ethyl (B): <11 mg/dL (ref 0–11)

## 2012-12-09 LAB — AMMONIA: Ammonia: 12 umol/L (ref 11–60)

## 2012-12-09 MED ORDER — ACETAMINOPHEN 325 MG PO TABS
650.0000 mg | ORAL_TABLET | Freq: Four times a day (QID) | ORAL | Status: DC | PRN
  Administered 2012-12-13 – 2012-12-14 (×2): 650 mg via ORAL
  Filled 2012-12-09 (×2): qty 2

## 2012-12-09 MED ORDER — POTASSIUM CHLORIDE IN NACL 20-0.9 MEQ/L-% IV SOLN
INTRAVENOUS | Status: DC
  Administered 2012-12-09 – 2012-12-10 (×2): via INTRAVENOUS
  Administered 2012-12-11: 1000 mL via INTRAVENOUS
  Administered 2012-12-11 – 2012-12-12 (×3): via INTRAVENOUS
  Filled 2012-12-09 (×10): qty 1000

## 2012-12-09 MED ORDER — SODIUM CHLORIDE 0.9 % IJ SOLN
3.0000 mL | Freq: Two times a day (BID) | INTRAMUSCULAR | Status: DC
  Administered 2012-12-10 – 2012-12-13 (×7): 3 mL via INTRAVENOUS

## 2012-12-09 MED ORDER — GUAIFENESIN-DM 100-10 MG/5ML PO SYRP
5.0000 mL | ORAL_SOLUTION | ORAL | Status: DC | PRN
  Administered 2012-12-10: 5 mL via ORAL
  Filled 2012-12-09: qty 5

## 2012-12-09 MED ORDER — SODIUM CHLORIDE 0.9 % IV SOLN
Freq: Once | INTRAVENOUS | Status: AC
  Administered 2012-12-09: 14:00:00 via INTRAVENOUS

## 2012-12-09 MED ORDER — VITAMIN D3 25 MCG (1000 UNIT) PO TABS
1000.0000 [IU] | ORAL_TABLET | Freq: Every day | ORAL | Status: DC
  Administered 2012-12-09 – 2012-12-13 (×5): 1000 [IU] via ORAL
  Filled 2012-12-09 (×6): qty 1

## 2012-12-09 MED ORDER — POTASSIUM CHLORIDE CRYS ER 20 MEQ PO TBCR
20.0000 meq | EXTENDED_RELEASE_TABLET | Freq: Two times a day (BID) | ORAL | Status: DC
  Administered 2012-12-09 – 2012-12-13 (×9): 20 meq via ORAL
  Filled 2012-12-09 (×11): qty 1

## 2012-12-09 MED ORDER — INSULIN ASPART 100 UNIT/ML ~~LOC~~ SOLN
0.0000 [IU] | Freq: Three times a day (TID) | SUBCUTANEOUS | Status: DC
  Administered 2012-12-10: 2 [IU] via SUBCUTANEOUS
  Administered 2012-12-12: 1 [IU] via SUBCUTANEOUS
  Administered 2012-12-12: 2 [IU] via SUBCUTANEOUS

## 2012-12-09 MED ORDER — ACETAMINOPHEN 650 MG RE SUPP: 650.0000 mg | Freq: Four times a day (QID) | RECTAL | Status: DC | PRN

## 2012-12-09 MED ORDER — TRAMADOL HCL 50 MG PO TABS
50.0000 mg | ORAL_TABLET | Freq: Four times a day (QID) | ORAL | Status: DC | PRN
  Administered 2012-12-10 – 2012-12-11 (×3): 50 mg via ORAL
  Filled 2012-12-09 (×3): qty 1

## 2012-12-09 MED ORDER — VERAPAMIL HCL 240 MG (CO) PO TB24
240.0000 mg | ORAL_TABLET | Freq: Every day | ORAL | Status: DC
  Administered 2012-12-10 – 2012-12-13 (×4): 240 mg via ORAL
  Filled 2012-12-09 (×7): qty 1

## 2012-12-09 MED ORDER — ENOXAPARIN SODIUM 40 MG/0.4ML ~~LOC~~ SOLN
40.0000 mg | SUBCUTANEOUS | Status: DC
  Administered 2012-12-09 – 2012-12-13 (×5): 40 mg via SUBCUTANEOUS
  Filled 2012-12-09 (×6): qty 0.4

## 2012-12-09 MED ORDER — BENZONATATE 100 MG PO CAPS
100.0000 mg | ORAL_CAPSULE | Freq: Two times a day (BID) | ORAL | Status: DC
  Administered 2012-12-09 – 2012-12-13 (×9): 100 mg via ORAL
  Filled 2012-12-09 (×12): qty 1

## 2012-12-09 MED ORDER — HYDROCODONE-ACETAMINOPHEN 5-325 MG PO TABS: 1.0000 | ORAL_TABLET | Freq: Four times a day (QID) | ORAL | Status: DC | PRN

## 2012-12-09 MED ORDER — ASPIRIN EC 325 MG PO TBEC
325.0000 mg | DELAYED_RELEASE_TABLET | Freq: Every day | ORAL | Status: DC
  Administered 2012-12-10 – 2012-12-13 (×3): 325 mg via ORAL
  Filled 2012-12-09 (×5): qty 1

## 2012-12-09 MED ORDER — INSULIN ASPART 100 UNIT/ML ~~LOC~~ SOLN: 0.0000 [IU] | Freq: Every day | SUBCUTANEOUS | Status: DC

## 2012-12-09 MED ORDER — ACETAMINOPHEN 500 MG PO TABS: 500.0000 mg | ORAL_TABLET | Freq: Four times a day (QID) | ORAL | Status: DC | PRN

## 2012-12-09 MED ORDER — DONEPEZIL HCL 5 MG PO TABS
5.0000 mg | ORAL_TABLET | Freq: Every day | ORAL | Status: DC
  Administered 2012-12-09 – 2012-12-13 (×5): 5 mg via ORAL
  Filled 2012-12-09 (×6): qty 1

## 2012-12-09 MED ORDER — SIMVASTATIN 20 MG PO TABS: 20.0000 mg | ORAL_TABLET | Freq: Every day | ORAL | Status: DC

## 2012-12-09 MED ORDER — ATORVASTATIN CALCIUM 10 MG PO TABS
10.0000 mg | ORAL_TABLET | Freq: Every day | ORAL | Status: DC
  Administered 2012-12-09 – 2012-12-13 (×5): 10 mg via ORAL
  Filled 2012-12-09 (×6): qty 1

## 2012-12-09 MED ORDER — ALBUTEROL SULFATE (5 MG/ML) 0.5% IN NEBU
2.5000 mg | INHALATION_SOLUTION | Freq: Four times a day (QID) | RESPIRATORY_TRACT | Status: DC
  Administered 2012-12-09 – 2012-12-13 (×14): 2.5 mg via RESPIRATORY_TRACT
  Filled 2012-12-09 (×16): qty 0.5

## 2012-12-09 NOTE — ED Notes (Signed)
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

## 2012-12-09 NOTE — ED Provider Notes (Signed)
CSN: [id number]     Arrival date & time 12/09/12  1200 History   First MD Initiated Contact with Patient 12/09/12 1218     Chief Complaint  Patient presents with  . Altered Mental Status   (Consider location/radiation/quality/duration/timing/severity/associated sxs/prior Treatment) Patient is a 72 y.o. female presenting with altered mental status. The history is provided by the patient and a friend. No language interpreter was used.  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 She was found in the unit with altered mental status, c/o back pain and stating "I need to find a surgeon." Per former co-workers, she is significantly confused when she is normally very oriented, has no history of early or established dementia and making no rational sense. She complains only of back pain. Denies dysuria, N, V, recent fever, SOB or chest pain.   Past Medical History  Diagnosis Date  . Osteoarthrosis, unspecified whether generalized or localized, unspecified site   . Gout, unspecified   . Hepatitis, unspecified   . Other chronic sinusitis   . Unspecified chronic bronchitis     sees Dr. Melba Coon, last visit 08/2011, treated for E. Coli- resp. /w Ceftin, Feb. 2013  . Hypertension   . GERD (gastroesophageal reflux disease)     rolaids if needed   Past Surgical History  Procedure Laterality Date  . Tubal ligation    . Vaginal hysterectomy    . Tonsillectomy    . Rotator cuff repair      left  . Carpal tunnel release  09/06/2011    Procedure: CARPAL TUNNEL RELEASE;  Surgeon: Kennieth Rad, MD;  Location: [facility] OR;  Service: Orthopedics;  Laterality: Right;   Family History  Problem Relation Age of Onset  . Heart disease Father   . ALS Brother    History  Substance Use Topics  . Smoking status: Never Smoker    . Smokeless tobacco: Never Used  . Alcohol Use: Yes     Comment: occassionally   OB History   Grav Para Term Preterm Abortions TAB SAB Ect Mult Living                 Review of Systems  Unable to perform ROS: Mental status change    Allergies  Codeine; Penicillins; Tetanus toxoids; and Levofloxacin  Home Medications   Current Outpatient Rx  Name  Route  Sig  Dispense  Refill  . acetaminophen (TYLENOL) 500 MG tablet   Oral   Take 500 mg by mouth every 6 (six) hours as needed. For pain         . albuterol (PROAIR HFA) 108 (90 BASE) MCG/ACT inhaler   Inhalation   Inhale 2 puffs into the lungs every 6 (six) hours as needed for wheezing or shortness of breath.          Marland Kitchen albuterol (PROVENTIL) (2.5 MG/3ML) 0.083% nebulizer solution   Nebulization   Take 2.5 mg by nebulization every 6 (six) hours as needed. For wheezing or shortness of breath         . Ascorbic Acid (VITAMIN C) 500 MG tablet   Oral   Take 500 mg by mouth daily.           . benzonatate (TESSALON) 100 MG capsule   Oral   Take 100  mg by mouth 2 (two) times daily.         Marland Kitchen BLACK COHOSH PO   Oral   Take 1 tablet by mouth daily.           . cholecalciferol (VITAMIN D) 1000 UNITS tablet   Oral   Take 1,000 Units by mouth daily.         . clarithromycin (BIAXIN) 500 MG tablet      1 twice daily after meals   20 tablet   0   . donepezil (ARICEPT) 5 MG tablet   Oral   Take 5 mg by mouth at bedtime.         Marland Kitchen etodolac (LODINE) 400 MG tablet   Oral   Take 400 mg by mouth daily.           . fexofenadine-pseudoephedrine (ALLEGRA-D 12 HOUR) 60-120 MG per tablet   Oral   Take 1 tablet by mouth 2 (two) times daily.           . fish oil-omega-3 fatty acids 1000 MG capsule   Oral   Take 2 g by mouth daily.         . furosemide (LASIX) 40 MG tablet   Oral   Take 40 mg by mouth daily.         Marland Kitchen HYDROcodone-acetaminophen (NORCO/VICODIN) 5-325 MG per tablet   Oral   Take 1  tablet by mouth as needed for pain.         . IRON PO   Oral   Take 1 tablet by mouth daily.         . Lecithin 1200 MG CAPS   Oral   Take 2,400 mg by mouth daily.         . Multiple Vitamin (MULTIVITAMIN) capsule   Oral   Take 1 capsule by mouth daily.           . potassium chloride SA (K-DUR,KLOR-CON) 20 MEQ tablet   Oral   Take 20 mEq by mouth 2 (two) times daily.           . simvastatin (ZOCOR) 20 MG tablet   Oral   Take 20 mg by mouth at bedtime.           . St Johns Wort 300 MG CAPS   Oral   Take 300 mg by mouth daily.         . traMADol (ULTRAM) 50 MG tablet   Oral   Take 1 tablet (50 mg total) by mouth every 8 (eight) hours as needed for pain.   15 tablet   0   . triamterene-hydrochlorothiazide (MAXZIDE-25) 37.5-25 MG per tablet   Oral   Take 1 tablet by mouth daily.           Marland Kitchen venlafaxine (EFFEXOR) 37.5 MG tablet   Oral   Take 37.5 mg by mouth daily as needed (hot flashes).         . verapamil (COVERA HS) 240 MG (CO) 24 hr tablet   Oral   Take 240 mg by mouth at bedtime.           . chlorpheniramine-HYDROcodone (TUSSIONEX) 10-8 MG/5ML LQCR   Oral   Take 5 mLs by mouth every 12 (twelve) hours as needed.   180 mL   0    BP 117/62  Pulse 73  Temp(Src) 98.1 F (36.7 C) (Oral)  Resp 18  SpO2 97% Physical Exam  Constitutional: She appears well-developed and  well-nourished. No distress.  HENT:  Head: Normocephalic and atraumatic.  Eyes: Pupils are equal, round, and reactive to light.  Neck: Normal range of motion. Neck supple.  Cardiovascular: Normal rate and regular rhythm.   No murmur heard. Pulmonary/Chest: Effort normal and breath sounds normal.  Abdominal: Soft. Bowel sounds are normal. There is no tenderness. There is no rebound and no guarding.  Musculoskeletal: Normal range of motion.  Neurological: She is alert. She has normal strength. No cranial nerve deficit or sensory deficit. Coordination normal. GCS eye subscore  is 4. GCS verbal subscore is 5. GCS motor subscore is 6.  She is oriented to person and place.  Skin: Skin is warm and dry. No rash noted.  Psychiatric: She has a normal mood and affect. Her speech is rapid and/or pressured. She is agitated and hyperactive. She is not actively hallucinating. Thought content is delusional. Cognition and memory are impaired.    ED Course  Procedures (including critical care time) Labs Review Labs Reviewed  CBC WITH DIFFERENTIAL - Abnormal; Notable for the following:    WBC 13.1 (*)    RBC 3.82 (*)    HCT 34.9 (*)    Neutrophils Relative % 89 (*)    Neutro Abs 11.7 (*)    Lymphocytes Relative 11 (*)    Monocytes Relative 0 (*)    All other components within normal limits  COMPREHENSIVE METABOLIC PANEL - Abnormal; Notable for the following:    Glucose, Bld 239 (*)    BUN 41 (*)    Albumin 3.1 (*)    Total Bilirubin 0.1 (*)    GFR calc non Af Amer 52 (*)    GFR calc Af Amer 60 (*)    All other components within normal limits  GLUCOSE, CAPILLARY - Abnormal; Notable for the following:    Glucose-Capillary 251 (*)    All other components within normal limits  URINE CULTURE  TROPONIN I  URINALYSIS, ROUTINE W REFLEX MICROSCOPIC  URINE RAPID DRUG SCREEN (HOSP PERFORMED)  LACTIC ACID, PLASMA   Imaging Review Dg Chest Portable 1 View  12/09/2012   *RADIOLOGY REPORT*  Clinical Data: Altered mental status  PORTABLE CHEST - 1 VIEW  Comparison: 11/09/2012 and 02/04/2012  Findings: Cardiac leads project over the chest.  A moderate convex right scoliosis of the mid/lower thoracic and visualized lumbar spine is again noted.  Lung volumes are low bilaterally.  There is streaky bibasilar atelectasis.  No definite airspace disease is identified.  No visible pleural effusion or pneumothorax.  There are degenerative changes of both shoulders.  IMPRESSION:  1.  Streaky bibasilar atelectasis and low lung volumes. 2. Chronic moderate convex right scoliosis of the thoracic  and lumbar spine.   Original Report Authenticated By: Britta Mccreedy, M.D.    MDM  No diagnosis found. 1. Altered mental status 2. Back pain  Plan is to admit patient for evaluation of altered mental status. Care transferred to [facility], PA-C, who was introduced to the patient, with labs and imaging pending. Family at bedside for additional history.     Arnoldo Hooker, PA-C 12/09/12 1538

## 2012-12-09 NOTE — ED Notes (Signed)
Notified RN oc CBG 251

## 2012-12-09 NOTE — ED Provider Notes (Signed)
Care assumed from [facility], PA-C  Sheila Cisneros is a 73 y.o. female presents with AMS and c/o back and right hip pain.    Pt family in the room reports that she has had altered mentation for several weeks, gradually worsening.  Pt's sister-in-law believes that the patient is taking too much of her pain medication and relates that the alteration in mental status seems to have been going on for several weeks and gradually increasing.  When I asked the sister-in-law to expand on the patient's altered mental status she states the patient "acts stupid like."  She is unable to elaborate further.  OR technicians who escorted the patient down to the emergency room related that she was unable to find her car and was very different from when she was seen on Sunday.    Face to face Exam:   General: Awake, laughing, giddy HEENT: Atraumatic  Resp: Normal effort  Abd: Nondistended  Neuro:No focal weakness  Lymph: No adenopathy Musculoskeletal: Mild pain to palpation of the paraspinal muscles of the L-spine  4:32 PM CBC with mild leukocytosis, urinalysis for evidence of urinary tract infection, drug screen positive for benzos and opiates, troponin normal, lactic acid within normal limits and CMP unremarkable. CT head with new small vessel ischemic change.  Pt admitted to the hospitalist by Dr Clarene Duke.  Sheila Client Chong Wojdyla, PA-C 12/09/12 2047

## 2012-12-09 NOTE — ED Notes (Signed)
Called CT staff.  They will come get pt in the next 5 minutes.

## 2012-12-09 NOTE — H&P (Signed)
[facility] History and Physical  Sheila Cisneros ZOX:[id number] DOB: 1941/04/12 DOA: 12/09/2012  Referring physician: Clarene Duke PCP: Alva Garnet., MD   Chief Complaint: back pain  HPI: Sheila Cisneros is a 72 y.o. female brought to the ED by colleagues who found patient wandering around, confused.  Hx is per EDP and record review.  No family or friends currently available.  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 She didn't know where she had parked and otherwise disoriented.  They were concerned she had taken too many pain pills.  Usually, no problems with cognition.  Workup significant for possible new infarct on CT, hyperglycemia, elevated BUN.  UDS positive for opiate and benzo. Patient reports she drank "about an inch" of liquor yesterday. Says she came looking for help for back pain.   Review of Systems: difficult due to confusion.  Past Medical History  Diagnosis Date  . Gout, unspecified   . Other chronic sinusitis   . Unspecified chronic bronchitis     sees Dr. Melba Coon, last visit 08/2011, treated for E. Coli- resp. /w Ceftin, Feb. 2013  . Hypertension   . GERD (gastroesophageal reflux disease)     rolaids if needed  . COPD (chronic obstructive pulmonary disease)   . Pneumonia     "couple times; long time ago" (12/09/2012)  . Chronic bronchitis     "couple times/yr" (12/09/2012)  . Exertional shortness of breath   . History of blood transfusion     "related to a surgery, I think" (12/09/2012)  . Hepatitis, unspecified     "the one that's not bad" (12/09/2012)  . Migraines     "in the past" (12/09/2012)  . Osteoarthrosis, unspecified whether generalized or localized, unspecified site   . Arthritis     "all over" (12/09/2012)  . Chronic lower back pain   . Altered mental status 12/09/2012   Past Surgical History  Procedure Laterality Date  . Tubal ligation    . Tonsillectomy    . Shoulder arthroscopy w/ rotator cuff repair  Right   . Carpal tunnel release  09/06/2011    Procedure: CARPAL TUNNEL RELEASE;  Surgeon: Kennieth Rad, MD;  Location: [facility];  Service: Orthopedics;  Laterality: Right;  . Abdominal hysterectomy      "w/right ovary" (12/09/2012)  . Appendectomy    . Breast biopsy Right    Social History:  reports that she has never smoked. She has never used smokeless tobacco. She reports that  drinks alcohol. She reports that she does not use illicit drugs.  Allergies  Allergen Reactions  . Codeine Rash     angioedema  . Penicillins Itching and Rash       . Tetanus Toxoids Rash  . Levofloxacin Rash    Family History  Problem Relation Age of Onset  . Heart disease Father   . ALS Brother    Prior to Admission medications   Medication Sig Start Date End Date Taking? Authorizing Provider  acetaminophen (TYLENOL) 500 MG tablet Take 500 mg by mouth every 6 (six) hours as needed. For pain   Yes Historical Provider, MD  albuterol (PROAIR HFA) 108 (90 BASE) MCG/ACT inhaler Inhale 2 puffs into the lungs every 6 (six) hours as needed for wheezing or shortness of breath.    Yes Historical Provider, MD  albuterol (PROVENTIL) (2.5 MG/3ML) 0.083% nebulizer solution Take 2.5 mg by nebulization every 6 (six) hours as needed.  For wheezing or shortness of breath   Yes Historical Provider, MD  Ascorbic Acid (VITAMIN C) 500 MG tablet Take 500 mg by mouth daily.     Yes Historical Provider, MD  benzonatate (TESSALON) 100 MG capsule Take 100 mg by mouth 2 (two) times daily.   Yes Historical Provider, MD  BLACK COHOSH PO Take 1 tablet by mouth daily.     Yes Historical Provider, MD  cholecalciferol (VITAMIN D) 1000 UNITS tablet Take 1,000 Units by mouth daily.   Yes Historical Provider, MD  clarithromycin (BIAXIN) 500 MG tablet 1 twice daily after meals 11/20/12  Yes Waymon Budge, MD  donepezil (ARICEPT) 5 MG tablet Take 5 mg by mouth at bedtime.   Yes Historical Provider, MD  etodolac (LODINE) 400 MG tablet Take 400  mg by mouth daily.     Yes Historical Provider, MD  fexofenadine-pseudoephedrine (ALLEGRA-D 12 HOUR) 60-120 MG per tablet Take 1 tablet by mouth 2 (two) times daily.     Yes Historical Provider, MD  fish oil-omega-3 fatty acids 1000 MG capsule Take 2 g by mouth daily.   Yes Historical Provider, MD  furosemide (LASIX) 40 MG tablet Take 40 mg by mouth daily.   Yes Historical Provider, MD  HYDROcodone-acetaminophen (NORCO/VICODIN) 5-325 MG per tablet Take 1 tablet by mouth as needed for pain.   Yes Historical Provider, MD  IRON PO Take 1 tablet by mouth daily.   Yes Historical Provider, MD  Lecithin 1200 MG CAPS Take 2,400 mg by mouth daily.   Yes Historical Provider, MD  Multiple Vitamin (MULTIVITAMIN) capsule Take 1 capsule by mouth daily.     Yes Historical Provider, MD  potassium chloride SA (K-DUR,KLOR-CON) 20 MEQ tablet Take 20 mEq by mouth 2 (two) times daily.     Yes Historical Provider, MD  simvastatin (ZOCOR) 20 MG tablet Take 20 mg by mouth at bedtime.     Yes Historical Provider, MD  [facility] Wort 300 MG CAPS Take 300 mg by mouth daily.   Yes Historical Provider, MD  traMADol (ULTRAM) 50 MG tablet Take 1 tablet (50 mg total) by mouth every 8 (eight) hours as needed for pain. 11/09/12  Yes Sena Hitch, MD  triamterene-hydrochlorothiazide (MAXZIDE-25) 37.5-25 MG per tablet Take 1 tablet by mouth daily.     Yes Historical Provider, MD  venlafaxine (EFFEXOR) 37.5 MG tablet Take 37.5 mg by mouth daily as needed (hot flashes).   Yes Historical Provider, MD  verapamil (COVERA HS) 240 MG (CO) 24 hr tablet Take 240 mg by mouth at bedtime.     Yes Historical Provider, MD  chlorpheniramine-HYDROcodone (TUSSIONEX) 10-8 MG/5ML LQCR Take 5 mLs by mouth every 12 (twelve) hours as needed. 09/01/12   Waymon Budge, MD   Physical Exam: Filed Vitals:   12/09/12 1745  BP: 96/58  Pulse:   Temp: 97.4 F (36.3 C)  Resp: 18    BP 96/58  Pulse 60  Temp(Src) 97.4 F (36.3 C) (Oral)  Resp 18  Ht 4'  11" (1.499 m)  Wt 67.042 kg (147 lb 12.8 oz)  BMI 29.84 kg/m2  SpO2 100%  General Appearance:    Alert, tangential, disoriented to time and situation. inappropriate laughter  Head:    Normocephalic, without obvious abnormality, atraumatic  Eyes:    PERRL, conjunctiva/corneas clear, EOM's intact, fundi    benign, both eyes  Ears:    Normal TM's and external ear canals, both ears  Nose:   Nares normal, septum midline,  mucosa normal, no drainage    or sinus tenderness  Throat:   Lips, mucosa, and tongue normal; teeth and gums normal  Neck:   Supple, symmetrical, trachea midline, no adenopathy;    thyroid:  no enlargement/tenderness/nodules; no carotid   bruit or JVD  Back:     No point tenderness along spine  Lungs:     Clear to auscultation bilaterally, respirations unlabored  Chest Wall:    No tenderness or deformity   Heart:    Regular rate and rhythm, S1 and S2 normal, no murmur, rub   or gallop     Abdomen:     Soft, non-tender, bowel sounds active all four quadrants,    no masses, no organomegaly  Genitalia:    deferred  Rectal:    deferred  Extremities:   Extremities normal, atraumatic, no cyanosis or edema  Pulses:   2+ and symmetric all extremities  Skin:   Skin color, texture, turgor normal, no rashes or lesions  Lymph nodes:   Cervical, supraclavicular, and axillary nodes normal  Neurologic:   CNII-XII intact, normal strength, sensation and reflexes    throughout    Psych:  Distracted. Inappropriate laughter, joking. Cooperative.  Labs on Admission:  Basic Metabolic Panel:  Recent Labs Lab 12/09/12 1253  NA 141  K 3.5  CL 104  CO2 25  GLUCOSE 239*  BUN 41*  CREATININE 1.06  CALCIUM 9.8   Liver Function Tests:  Recent Labs Lab 12/09/12 1253  AST 17  ALT 22  ALKPHOS 85  BILITOT 0.1*  PROT 7.2  ALBUMIN 3.1*   No results found for this basename: LIPASE, AMYLASE,  in the last 168 hours No results found for this basename: AMMONIA,  in the last 168  hours CBC:  Recent Labs Lab 12/09/12 1253  WBC 13.1*  NEUTROABS 11.7*  HGB 12.2  HCT 34.9*  MCV 91.4  PLT 273   Cardiac Enzymes:  Recent Labs Lab 12/09/12 1254  TROPONINI <0.30    BNP (last 3 results)  Recent Labs  11/09/12 1638  PROBNP 47.3   CBG:  Recent Labs Lab 12/09/12 1238  GLUCAP 251*    Radiological Exams on Admission: Ct Head Wo Contrast  12/09/2012   *RADIOLOGY REPORT*  Clinical Data: Altered mental status.  CT HEAD WITHOUT CONTRAST  Technique:  Contiguous axial images were obtained from the base of the skull through the vertex without contrast.  Comparison: Brain MR 03/07/2012 (report not available).  Most recent CT of 07/17/2004  Findings: Bone windows demonstrate fluid within bilateral maxillary sinuses.  Fluid in the sphenoid sinus with mucosal thickening of ethmoid air cells and complete opacification of frontal sinuses. Clear mastoid air cells.  Aerated petrous apices.  Soft tissue windows demonstrate mild low density in the periventricular white matter likely related to small vessel disease.This is increased. Hypoattenuation within the basal ganglia, along the anterior limbs of the internal capsules.  Slightly greater on the right than left. Present (but less conspicuous) on the right and likely present on the left in 2006.  Otherwise, no  mass lesion, hemorrhage, hydrocephalus, acute infarct, intra-axial, or extra-axial fluid collection.  IMPRESSION:  1.  Hypoattenuation within the anterior basal ganglia bilaterally. Although subtly apparent, at least on the right, in 2006, new or progressive subacute ischemia cannot be entirely excluded.  If the patient has localizing symptoms to this area, consider MRI. 2.  New mild small vessel ischemic change. 3.  Advanced sinus disease.   Original Report Authenticated  By: Jeronimo Greaves, M.D.   Dg Chest Portable 1 View  12/09/2012   *RADIOLOGY REPORT*  Clinical Data: Altered mental status  PORTABLE CHEST - 1 VIEW   Comparison: 11/09/2012 and 02/04/2012  Findings: Cardiac leads project over the chest.  A moderate convex right scoliosis of the mid/lower thoracic and visualized lumbar spine is again noted.  Lung volumes are low bilaterally.  There is streaky bibasilar atelectasis.  No definite airspace disease is identified.  No visible pleural effusion or pneumothorax.  There are degenerative changes of both shoulders.  IMPRESSION:  1.  Streaky bibasilar atelectasis and low lung volumes. 2. Chronic moderate convex right scoliosis of the thoracic and lumbar spine.   Original Report Authenticated By: Britta Mccreedy, M.D.   Assessment/Plan    Confusion: medication effect? Hold hydrocodone.  CT shows possible CVA.  No focal defecits. Check MRI.  If CVA, complete stroke workup. ASA for now.  Check BAL.  Neuro checks.  Tele for now.  Check NH3, b12, folate. Dehydration contributing    HYPERTENSION:  Hold diuretics    Back pain, chronic    Hyperglycemia: no h/o DM. Check hgb a1c, CBGs    Dehydration: IVF    Bronchiectasis without acute exacerbation    GERD (gastroesophageal reflux disease)  Code Status: full Family Communication: none available Disposition Plan: home  Time spent: 60 minutes  Keyontay Stolz L [facility] Pager [phone]  If 7PM-7AM, please contact night-coverage [email or web address] Password [facility] 12/09/2012, 6:55 PM

## 2012-12-09 NOTE — ED Provider Notes (Signed)
Medical screening examination/treatment/procedure(s) were performed by non-physician practitioner and as supervising physician I was immediately available for consultation/collaboration.   Glynn Octave, MD 12/09/12 [id number]

## 2012-12-09 NOTE — ED Notes (Addendum)
Pt sts she is only having back pain, has had it for a long time but this past week the pain is radiating down right leg. Pt has had steroid injections in her back but doesn't think they work that great. sts she went to doctor yesterday and got a shot. Takes hydrocodone at home. sts she did fall a couple days ago, landed on her bottom. Denies injury to head/LOC. Denies abd pain/sob/HA. Pt laughing about situation. sts she is only here for her back pain. Friends sts this is not the way she normally acts.

## 2012-12-10 ENCOUNTER — Encounter (HOSPITAL_COMMUNITY): Payer: Self-pay | Admitting: Certified Registered"

## 2012-12-10 ENCOUNTER — Inpatient Hospital Stay (HOSPITAL_COMMUNITY): Payer: Medicare Other

## 2012-12-10 DIAGNOSIS — F29 Unspecified psychosis not due to a substance or known physiological condition: Secondary | ICD-10-CM

## 2012-12-10 DIAGNOSIS — F0280 Dementia in other diseases classified elsewhere without behavioral disturbance: Secondary | ICD-10-CM

## 2012-12-10 DIAGNOSIS — J440 Chronic obstructive pulmonary disease with acute lower respiratory infection: Secondary | ICD-10-CM

## 2012-12-10 DIAGNOSIS — F0150 Vascular dementia without behavioral disturbance: Secondary | ICD-10-CM | POA: Diagnosis present

## 2012-12-10 DIAGNOSIS — D72829 Elevated white blood cell count, unspecified: Secondary | ICD-10-CM | POA: Diagnosis present

## 2012-12-10 LAB — CBC WITH DIFFERENTIAL/PLATELET
Basophils Relative: 0 % (ref 0–1)
Eosinophils Absolute: 0 10*3/uL (ref 0.0–0.7)
Lymphs Abs: 2.9 10*3/uL (ref 0.7–4.0)
MCH: 31.9 pg (ref 26.0–34.0)
MCHC: 35.3 g/dL (ref 30.0–36.0)
Neutrophils Relative %: 74 % (ref 43–77)
Platelets: 263 10*3/uL (ref 150–400)
RBC: 3.57 MIL/uL — ABNORMAL LOW (ref 3.87–5.11)

## 2012-12-10 LAB — BASIC METABOLIC PANEL
Calcium: 9.1 mg/dL (ref 8.4–10.5)
GFR calc non Af Amer: 83 mL/min — ABNORMAL LOW (ref >90)
Glucose, Bld: 151 mg/dL — ABNORMAL HIGH (ref 70–99)
Sodium: 139 mEq/L (ref 135–145)

## 2012-12-10 LAB — URINE CULTURE

## 2012-12-10 LAB — GLUCOSE, CAPILLARY
Glucose-Capillary: 147 mg/dL — ABNORMAL HIGH (ref 70–99)
Glucose-Capillary: 159 mg/dL — ABNORMAL HIGH (ref 70–99)

## 2012-12-10 LAB — TSH: TSH: 0.152 u[IU]/mL — ABNORMAL LOW (ref 0.350–4.500)

## 2012-12-10 LAB — HEMOGLOBIN A1C
Hgb A1c MFr Bld: 7.1 % — ABNORMAL HIGH (ref <5.7)
Mean Plasma Glucose: 157 mg/dL — ABNORMAL HIGH (ref <117)

## 2012-12-10 MED ORDER — CLINDAMYCIN PHOSPHATE 600 MG/50ML IV SOLN
600.0000 mg | Freq: Three times a day (TID) | INTRAVENOUS | Status: DC
  Administered 2012-12-10 – 2012-12-13 (×8): 600 mg via INTRAVENOUS
  Filled 2012-12-10 (×16): qty 50

## 2012-12-10 MED ORDER — INSULIN ASPART 100 UNIT/ML ~~LOC~~ SOLN
0.0000 [IU] | SUBCUTANEOUS | Status: DC
  Administered 2012-12-10: 2 [IU] via SUBCUTANEOUS
  Administered 2012-12-11: 3 [IU] via SUBCUTANEOUS
  Administered 2012-12-11 (×2): 2 [IU] via SUBCUTANEOUS
  Administered 2012-12-11: 5 [IU] via SUBCUTANEOUS
  Administered 2012-12-11: 2 [IU] via SUBCUTANEOUS

## 2012-12-10 MED ORDER — PREDNISONE 50 MG PO TABS
50.0000 mg | ORAL_TABLET | Freq: Every day | ORAL | Status: AC
  Administered 2012-12-10 – 2012-12-12 (×3): 50 mg via ORAL
  Filled 2012-12-10 (×4): qty 1

## 2012-12-10 NOTE — Evaluation (Signed)
Physical Therapy Evaluation Patient Details Name: Sheila Cisneros MRN: [id number] DOB: 01-Aug-1940 Today's Date: 12/10/2012 Time: 3244-0102 PT Time Calculation (min): 27 min  PT Assessment / Plan / Recommendation History of Present Illness  Sheila Cisneros is a 72 y.o. female brought to the ED by colleagues who found patient wandering around, confused.  Hx is per EDP and record review.  No family or friends currently available.  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 She didn't know where she had parked and otherwise disoriented.  They were concerned she had taken too many pain pills.  Usually, no problems with cognition.  Workup significant for possible new infarct on CT, hyperglycemia, elevated BUN.  UDS positive for opiate and benzo. Patient reports she drank "about an inch" of liquor yesterday. Says she came looking for help for back pain.  MRI pending.  Clinical Impression  Adm with confusion. Cognition still questionable today during my evaluation with impaired safety awareness, attention and judgement. Continues to complain of significant lower back pain that appears to be radiating into her left leg. She reports this switches back and forth depending on the day. She also let me know that she fell a few days ago and that since the fall her back pain has progressively gotten worse. RN aware of this concern. She was slightly more steady using RW however still needs 24 hour assist at home, especially given concern for cognition.     PT Assessment  Patient needs continued PT services    Follow Up Recommendations  Home health PT;Supervision/Assistance - 24 hour    Does the patient have the potential to tolerate intense rehabilitation      Barriers to Discharge        Equipment Recommendations  Rolling walker with 5" wheels    Recommendations for Other Services     Frequency Min 4X/week    Precautions / Restrictions Precautions Precautions:  Fall Restrictions Weight Bearing Restrictions: No   Pertinent Vitals/Pain Reports 6/10 back pain, repsitioned for pain      Mobility  Bed Mobility Bed Mobility: Supine to Sit Supine to Sit: 5: Supervision Transfers Transfers: Sit to Stand;Stand to Sit Sit to Stand: 4: Min assist;With upper extremity assist;From toilet Stand to Sit: 4: Min guard;With upper extremity assist;To bed;To toilet Details for Transfer Assistance: cues for safe hand placement and use of RW, pt rather impulsive Ambulation/Gait Ambulation/Gait Assistance: 4: Min assist Assistive device: Rolling walker Ambulation/Gait Assistance Details: cues for safe use of RW, gaurding and tactile assist for stability Gait Pattern: Step-through pattern;Trunk flexed;Decreased weight shift to left;Decreased stride length General Gait Details: pushes RW too far anteriorly with poor awareness or ability to problem solve the use of this device Modified Rankin (Stroke Patients Only) Pre-Morbid Rankin Score: No significant disability Modified Rankin: Moderately severe disability    Exercises     PT Diagnosis: Difficulty walking;Abnormality of gait;Generalized weakness;Acute pain;Altered mental status  PT Problem List: Decreased strength;Decreased balance;Decreased activity tolerance;Pain;Decreased mobility;Decreased knowledge of use of DME;Decreased cognition;Decreased safety awareness PT Treatment Interventions: DME instruction;Gait training;Functional mobility training;Stair training;Therapeutic activities;Therapeutic exercise;Balance training;Neuromuscular re-education;Patient/family education     PT Goals(Current goals can be found in the care plan section) Acute Rehab PT Goals Patient Stated Goal: home PT Goal Formulation: With patient Time For Goal Achievement: 12/17/12 Potential to Achieve Goals: Good  Visit Information  Last PT Received On: 12/10/12 Assistance Needed: +1 History of Present Illness: Sheila Cisneros  is  a 72 y.o. female brought to the ED by colleagues who found patient wandering around, confused.  Hx is per EDP and record review.  No family or friends currently available.  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 She didn't know where she had parked and otherwise disoriented.  They were concerned she had taken too many pain pills.  Usually, no problems with cognition.  Workup significant for possible new infarct on CT, hyperglycemia, elevated BUN.  UDS positive for opiate and benzo. Patient reports she drank "about an inch" of liquor yesterday. Says she came looking for help for back pain.  MRI pending.       Prior Functioning  Home Living Family/patient expects to be discharged to:: Private residence Living Arrangements: Spouse/significant other Available Help at Discharge: Family;Available PRN/intermittently Type of Home: House Home Access: Stairs to enter [employer] of Steps: 3 Entrance Stairs-Rails: None Home Layout: One level Home Equipment: None Prior Function Level of Independence: Independent Communication Communication: No difficulties    Cognition  Cognition Behavior During Therapy: Impulsive Overall Cognitive Status: No family/caregiver present to determine baseline cognitive functioning Area of Impairment: Attention;Safety/judgement Current Attention Level: Alternating;Selective Safety/Judgement: Decreased awareness of safety;Decreased awareness of deficits    Extremity/Trunk Assessment Upper Extremity Assessment Upper Extremity Assessment: Overall WFL for tasks assessed Lower Extremity Assessment Lower Extremity Assessment: Generalized weakness   Balance Balance Balance Assessed: Yes Static Standing Balance Static Standing - Balance Support: Bilateral upper extremity supported Static Standing - Level of Assistance: 4: Min assist  End of Session PT - End of Session Equipment Utilized During Treatment: Gait  belt Activity Tolerance: Patient tolerated treatment well Patient left: in bed;with call bell/phone within reach;with bed alarm set Nurse Communication: Mobility status  GP     Luis Nickles H 12/10/2012, 10:10 AM

## 2012-12-10 NOTE — ED Provider Notes (Signed)
Medical screening examination/treatment/procedure(s) were conducted as a shared visit with non-physician practitioner(s) and myself.  I personally evaluated the patient during the encounter  72yo F, BIB friends c/o pt with AMS. 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 States she fell approx 1 week ago and "aggravated it." Endorses etoh use yesterday. Pt's former co-workers state pt is not acting her norm. Family states she has been getting progressively confused over the past 1-2 weeks. VSS, afebrile, Pt awake, alert, does not recall how she got here other than "I drove myself and parked my van somewhere." Inappropriately laughing at times, talkative, fidgeting. CTA, RRR, abd soft/NT, neuro otherwise non-focal. UDS +benzos and opiates. Possible new infarct on CT-H. [facility].   Laray Anger, DO 12/10/12 2227

## 2012-12-10 NOTE — Progress Notes (Signed)
Patient evaluated for community based chronic disease management services with [facility] Care Management Program as a benefit of patient's [facility] Medicare Insurance.  Spoke with patient at bedside to explain [facility] Care Management services.  She continues to have moderate confusion but was agreeable to me speaking with her family about services.  Called and left message for patients brother who is listed as living with her.  Also left contact information and [facility] literature at bedside. Of note, [facility] Care Management services does not replace or interfere with any services that are arranged by inpatient case management or social work.  For additional questions or referrals please contact Anibal Henderson BSN RN [facility] Liaison at [phone].

## 2012-12-10 NOTE — Progress Notes (Signed)
Inpatient Diabetes Program Recommendations  AACE/ADA: New Consensus Statement on Inpatient Glycemic Control (2013)  Target Ranges:  Prepandial:   less than 140 mg/dL      Peak postprandial:   less than 180 mg/dL (1-2 hours)      Critically ill patients:  140 - 180 mg/dL   Reason for Visit: Results for Sheila, Cisneros (MRN [id number]) as of 12/10/2012 12:32  Ref. Range 12/09/2012 12:38 12/09/2012 19:00 12/09/2012 21:57 12/10/2012 06:50  Glucose-Capillary Latest Range: 70-99 mg/dL 409 (H) 811 (H) 914 (H) 147 (H)   Results for Sheila, Cisneros (MRN [id number]) as of 12/10/2012 12:32  Ref. Range 12/09/2012 18:44  Hemoglobin A1C Latest Range: <5.7 % 7.1 (H)   Note new hyperglycemia according to health history.  A1C is greater than 6.5% and by ADA guidelines and potentially has new onset diabetes?  MD please advise if this is new onset diabetes so that inpatient education and outpatient education can be arranged.

## 2012-12-10 NOTE — H&P (Signed)
[facility] Progress note  Sheila Cisneros WUJ:[id number] DOB: [birth year] DOA: 12/09/2012  Referring physician: Clarene Duke PCP: Alva Garnet., MD   Chief Complaint: back pain  HPI: Sheila Cisneros is a 72 y.o. female PMHx gout, hepatitis, HTN, hyperglycemia, bronchiectasis brought to the ED by colleagues who found patient wandering around, confused.  Hx is per EDP and record review.  No family or friends currently available.  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 She didn't know where she had parked and otherwise disoriented.  They were concerned she had taken too many pain pills.  Usually, no problems with cognition.  Workup significant for possible new infarct on CT, hyperglycemia, elevated BUN.  UDS positive for opiate and benzo. Patient reports she drank "about an inch" of liquor yesterday. Says she came looking for help for back pain. TODAY states the only reason she came to the hospital was for treatment for her lower back. Positive chronic back pain, States fell in the bathtub on Sunday/Monday which resulted in increased back pain with radiation down bilateral legs Rt> Lt. patient also has had a productive cough which is worsened, positive increasing SOB    Procedure  CT head without con 12/09/2012 1. Hypoattenuation within the anterior basal ganglia bilaterally.  Although subtly apparent, at least on the right, in 2006, new or  progressive subacute ischemia cannot be entirely excluded. If the  patient has localizing symptoms to this area, consider MRI.  2. New mild small vessel ischemic change.  3. Advanced sinus disease.   Brain MRI without contrast 12/10/2012 IMPRESSION:  Atrophy and moderate chronic microvascular ischemia. No acute  intracranial abnormality.  Sinusitis with air-fluid level.      Past Medical History  Diagnosis Date  . Gout, unspecified   . Other chronic sinusitis   . Unspecified chronic bronchitis      sees Dr. Melba Coon, last visit 08/2011, treated for E. Coli- resp. /w Ceftin, Feb. 2013  . Hypertension   . GERD (gastroesophageal reflux disease)     rolaids if needed  . COPD (chronic obstructive pulmonary disease)   . Pneumonia     "couple times; long time ago" (12/09/2012)  . Chronic bronchitis     "couple times/yr" (12/09/2012)  . Exertional shortness of breath   . History of blood transfusion     "related to a surgery, I think" (12/09/2012)  . Hepatitis, unspecified     "the one that's not bad" (12/09/2012)  . Migraines     "in the past" (12/09/2012)  . Osteoarthrosis, unspecified whether generalized or localized, unspecified site   . Arthritis     "all over" (12/09/2012)  . Chronic lower back pain   . Altered mental status 12/09/2012   Past Surgical History  Procedure Laterality Date  . Tubal ligation    . Tonsillectomy    . Shoulder arthroscopy w/ rotator cuff repair Right   . Carpal tunnel release  09/06/2011    Procedure: CARPAL TUNNEL RELEASE;  Surgeon: Kennieth Rad, MD;  Location: [facility] OR;  Service: Orthopedics;  Laterality: Right;  . Abdominal hysterectomy      "w/right ovary" (12/09/2012)  . Appendectomy    . Breast biopsy Right    Social History:  reports that she has never smoked. She has never used smokeless tobacco. She reports that  drinks alcohol. She reports that she does not use illicit drugs.  Allergies  Allergen Reactions  . Codeine  Rash     angioedema  . Penicillins Itching and Rash       . Tetanus Toxoids Rash  . Levofloxacin Rash    Family History  Problem Relation Age of Onset  . Heart disease Father   . ALS Brother    Prior to Admission medications   Medication Sig Start Date End Date Taking? Authorizing Provider  acetaminophen (TYLENOL) 500 MG tablet Take 500 mg by mouth every 6 (six) hours as needed. For pain   Yes Historical Provider, MD  albuterol (PROAIR HFA) 108 (90 BASE) MCG/ACT inhaler Inhale 2 puffs into the lungs every 6 (six) hours  as needed for wheezing or shortness of breath.    Yes Historical Provider, MD  albuterol (PROVENTIL) (2.5 MG/3ML) 0.083% nebulizer solution Take 2.5 mg by nebulization every 6 (six) hours as needed. For wheezing or shortness of breath   Yes Historical Provider, MD  Ascorbic Acid (VITAMIN C) 500 MG tablet Take 500 mg by mouth daily.     Yes Historical Provider, MD  benzonatate (TESSALON) 100 MG capsule Take 100 mg by mouth 2 (two) times daily.   Yes Historical Provider, MD  BLACK COHOSH PO Take 1 tablet by mouth daily.     Yes Historical Provider, MD  cholecalciferol (VITAMIN D) 1000 UNITS tablet Take 1,000 Units by mouth daily.   Yes Historical Provider, MD  clarithromycin (BIAXIN) 500 MG tablet 1 twice daily after meals 11/20/12  Yes Waymon Budge, MD  donepezil (ARICEPT) 5 MG tablet Take 5 mg by mouth at bedtime.   Yes Historical Provider, MD  etodolac (LODINE) 400 MG tablet Take 400 mg by mouth daily.     Yes Historical Provider, MD  fexofenadine-pseudoephedrine (ALLEGRA-D 12 HOUR) 60-120 MG per tablet Take 1 tablet by mouth 2 (two) times daily.     Yes Historical Provider, MD  fish oil-omega-3 fatty acids 1000 MG capsule Take 2 g by mouth daily.   Yes Historical Provider, MD  furosemide (LASIX) 40 MG tablet Take 40 mg by mouth daily.   Yes Historical Provider, MD  HYDROcodone-acetaminophen (NORCO/VICODIN) 5-325 MG per tablet Take 1 tablet by mouth as needed for pain.   Yes Historical Provider, MD  IRON PO Take 1 tablet by mouth daily.   Yes Historical Provider, MD  Lecithin 1200 MG CAPS Take 2,400 mg by mouth daily.   Yes Historical Provider, MD  Multiple Vitamin (MULTIVITAMIN) capsule Take 1 capsule by mouth daily.     Yes Historical Provider, MD  potassium chloride SA (K-DUR,KLOR-CON) 20 MEQ tablet Take 20 mEq by mouth 2 (two) times daily.     Yes Historical Provider, MD  simvastatin (ZOCOR) 20 MG tablet Take 20 mg by mouth at bedtime.     Yes Historical Provider, MD  [facility] Wort 300 MG  CAPS Take 300 mg by mouth daily.   Yes Historical Provider, MD  traMADol (ULTRAM) 50 MG tablet Take 1 tablet (50 mg total) by mouth every 8 (eight) hours as needed for pain. 11/09/12  Yes Sena Hitch, MD  triamterene-hydrochlorothiazide (MAXZIDE-25) 37.5-25 MG per tablet Take 1 tablet by mouth daily.     Yes Historical Provider, MD  venlafaxine (EFFEXOR) 37.5 MG tablet Take 37.5 mg by mouth daily as needed (hot flashes).   Yes Historical Provider, MD  verapamil (COVERA HS) 240 MG (CO) 24 hr tablet Take 240 mg by mouth at bedtime.     Yes Historical Provider, MD  chlorpheniramine-HYDROcodone (TUSSIONEX) 10-8 MG/5ML LQCR Take 5  mLs by mouth every 12 (twelve) hours as needed. 09/01/12   Waymon Budge, MD   Physical Exam: Filed Vitals:   12/10/12 1411  BP: 140/62  Pulse: 86  Temp: 98.8 F (37.1 C)  Resp: 18   BP 140/62  Pulse 86  Temp(Src) 98.8 F (37.1 C) (Oral)  Resp 18  Ht 4\' 11"  (1.499 m)  Wt 67.042 kg (147 lb 12.8 oz)  BMI 29.84 kg/m2  SpO2 95%  General Appearance:    Alert, NAD, answers all questions appropriately      Eyes:    PERRL, conjunctiva/corneas clear, EOM's intact, fundi    benign, both eyes              Back:     No point tenderness along spine  Lungs:     bilateral lower lobes for air movement, diffuse rales in remainder of lung       Heart:    Regular rate and rhythm, S1 and S2 normal, no murmur, rub   or gallop     Abdomen:     Soft, non-tender, bowel sounds active all four quadrants,    no masses, no organomegaly  Musculoskeletal; positive left lower back muscle spasms in the paraspinal muscles and lower latissimus. Positive midline pain L3-L5,, positive right SI joint pain to palpation, right trochanteric bursa pain to palpation,  pain with standing, patient dragging right foot with ambulation                     Neurologic:   CNII-XII intact, normal strength, sensation and reflexes    throughout      Labs on Admission:  Basic Metabolic  Panel:  Recent Labs Lab 12/09/12 1253 12/09/12 1930 12/10/12 0618  NA 141  --  139  K 3.5  --  3.7  CL 104  --  106  CO2 25  --  23  GLUCOSE 239*  --  151*  BUN 41*  --  26*  CREATININE 1.06 0.86 0.75  CALCIUM 9.8  --  9.1   Liver Function Tests:  Recent Labs Lab 12/09/12 1253  AST 17  ALT 22  ALKPHOS 85  BILITOT 0.1*  PROT 7.2  ALBUMIN 3.1*   No results found for this basename: LIPASE, AMYLASE,  in the last 168 hours  Recent Labs Lab 12/09/12 1900  AMMONIA 12   CBC:  Recent Labs Lab 12/09/12 1253 12/09/12 1930 12/10/12 0618  WBC 13.1* 12.7* 13.1*  NEUTROABS 11.7*  --  9.7*  HGB 12.2 12.2 11.4*  HCT 34.9* 34.8* 32.3*  MCV 91.4 91.1 90.5  PLT 273 267 263   Cardiac Enzymes:  Recent Labs Lab 12/09/12 1254  TROPONINI <0.30    BNP (last 3 results)  Recent Labs  11/09/12 1638  PROBNP 47.3   CBG:  Recent Labs Lab 12/09/12 1238 12/09/12 1900 12/09/12 2157 12/10/12 0650 12/10/12 1615  GLUCAP 251* 126* 165* 147* 159*    Radiological Exams on Admission: Ct Head Wo Contrast  12/09/2012   *RADIOLOGY REPORT*  Clinical Data: Altered mental status.  CT HEAD WITHOUT CONTRAST  Technique:  Contiguous axial images were obtained from the base of the skull through the vertex without contrast.  Comparison: Brain MR 03/07/2012 (report not available).  Most recent CT of 07/17/2004  Findings: Bone windows demonstrate fluid within bilateral maxillary sinuses.  Fluid in the sphenoid sinus with mucosal thickening of ethmoid air cells and complete opacification of frontal sinuses. Clear mastoid air  cells.  Aerated petrous apices.  Soft tissue windows demonstrate mild low density in the periventricular white matter likely related to small vessel disease.This is increased. Hypoattenuation within the basal ganglia, along the anterior limbs of the internal capsules.  Slightly greater on the right than left. Present (but less conspicuous) on the right and likely present on  the left in 2006.  Otherwise, no  mass lesion, hemorrhage, hydrocephalus, acute infarct, intra-axial, or extra-axial fluid collection.  IMPRESSION:  1.  Hypoattenuation within the anterior basal ganglia bilaterally. Although subtly apparent, at least on the right, in 2006, new or progressive subacute ischemia cannot be entirely excluded.  If the patient has localizing symptoms to this area, consider MRI. 2.  New mild small vessel ischemic change. 3.  Advanced sinus disease.   Original Report Authenticated By: Jeronimo Greaves, M.D.   Mr Brain Wo Contrast  12/10/2012   *RADIOLOGY REPORT*  Clinical Data: Altered mental status  MRI HEAD WITHOUT CONTRAST  Technique:  Multiplanar, multiecho pulse sequences of the brain and surrounding structures were obtained according to standard protocol without intravenous contrast.  Comparison: CT 12/09/2012  Findings: Age appropriate atrophy.  Negative for hydrocephalus. Chronic microvascular ischemic changes throughout the white matter extending into the internal capsule external capsule and basal ganglia.  Chronic ischemia in the thalamus and pons bilaterally.  Negative for acute infarct.  Negative for hemorrhage or mass lesion.  Mucosal thickening throughout the paranasal sinuses.  Air-fluid level maxillary  and sphenoid sinus.  Vessels at the base of the brain are patent.  IMPRESSION: Atrophy and moderate chronic microvascular ischemia.  No acute intracranial abnormality.  Sinusitis with air-fluid level.   Original Report Authenticated By: Janeece Riggers, M.D.   Dg Chest Portable 1 View  12/09/2012   *RADIOLOGY REPORT*  Clinical Data: Altered mental status  PORTABLE CHEST - 1 VIEW  Comparison: 11/09/2012 and 02/04/2012  Findings: Cardiac leads project over the chest.  A moderate convex right scoliosis of the mid/lower thoracic and visualized lumbar spine is again noted.  Lung volumes are low bilaterally.  There is streaky bibasilar atelectasis.  No definite airspace disease is  identified.  No visible pleural effusion or pneumothorax.  There are degenerative changes of both shoulders.  IMPRESSION:  1.  Streaky bibasilar atelectasis and low lung volumes. 2. Chronic moderate convex right scoliosis of the thoracic and lumbar spine.   Original Report Authenticated By: Britta Mccreedy, M.D.   Assessment/Plan    Confusion: Resolved patient alert and oriented x4, upset that she is being treated for condition she did not come in for.     HYPERTENSION:  Within AHA guidelines     Back pain, acute/chronic; obtain L-spine x-ray, will consider L-spine MRI    Hyperglycemia: no h/o DM. Check hgb a1c, CBGs, increased SSI to moderate with steroid burst    Dehydration: Continue IVF    Bronchiectasis with acute exacerbation; antibiotics plus a steroid burst. Continue nebulizer treatment    GERD (gastroesophageal reflux disease); stable  Alzheimer's; continue Aricept  Code Status: full Family Communication: none available Disposition Plan: home  Time spent: 40 minutes  Drema Dallas [facility] Pager [phone]  If 7PM-7AM, please contact night-coverage [email or web address] Password [facility] 12/10/2012, 5:36 PM

## 2012-12-11 DIAGNOSIS — J479 Bronchiectasis, uncomplicated: Secondary | ICD-10-CM

## 2012-12-11 LAB — TSH: TSH: 0.677 u[IU]/mL (ref 0.350–4.500)

## 2012-12-11 LAB — CBC WITH DIFFERENTIAL/PLATELET
Lymphocytes Relative: 24 % (ref 12–46)
Lymphs Abs: 2.8 10*3/uL (ref 0.7–4.0)
Neutrophils Relative %: 70 % (ref 43–77)
Platelets: 252 10*3/uL (ref 150–400)
RBC: 3.52 MIL/uL — ABNORMAL LOW (ref 3.87–5.11)
WBC: 11.8 10*3/uL — ABNORMAL HIGH (ref 4.0–10.5)

## 2012-12-11 LAB — GLUCOSE, CAPILLARY
Glucose-Capillary: 155 mg/dL — ABNORMAL HIGH (ref 70–99)
Glucose-Capillary: 126 mg/dL — ABNORMAL HIGH (ref 70–99)
Glucose-Capillary: 150 mg/dL — ABNORMAL HIGH (ref 70–99)

## 2012-12-11 LAB — HEPATITIS PANEL, ACUTE: Hep B C IgM: NEGATIVE

## 2012-12-11 LAB — T4, FREE: Free T4: 1.09 ng/dL (ref 0.80–1.80)

## 2012-12-11 MED ORDER — ALBUTEROL SULFATE (5 MG/ML) 0.5% IN NEBU: 2.5000 mg | INHALATION_SOLUTION | RESPIRATORY_TRACT | Status: DC | PRN

## 2012-12-11 MED ORDER — TRAMADOL HCL 50 MG PO TABS
100.0000 mg | ORAL_TABLET | Freq: Three times a day (TID) | ORAL | Status: DC
  Administered 2012-12-11 – 2012-12-13 (×7): 100 mg via ORAL
  Filled 2012-12-11 (×8): qty 2

## 2012-12-11 MED ORDER — ALUM & MAG HYDROXIDE-SIMETH 200-200-20 MG/5ML PO SUSP
15.0000 mL | ORAL | Status: DC | PRN
  Administered 2012-12-12: 15 mL via ORAL
  Filled 2012-12-11: qty 30

## 2012-12-11 MED ORDER — CYCLOBENZAPRINE HCL 10 MG PO TABS
10.0000 mg | ORAL_TABLET | Freq: Three times a day (TID) | ORAL | Status: DC
  Administered 2012-12-11 – 2012-12-13 (×7): 10 mg via ORAL
  Filled 2012-12-11 (×11): qty 1

## 2012-12-11 NOTE — Progress Notes (Signed)
Physical Therapy Treatment Patient Details Name: Sheila Cisneros MRN: [id number] DOB: [birth year] Today's Date: 12/11/2012 Time: 4098-1191 PT Time Calculation (min): 29 min  PT Assessment / Plan / Recommendation  History of Present Illness Sheila Cisneros is a 72 y.o. female brought to the ED by colleagues who found patient wandering around, confused.  Hx is per EDP and record review.  No family or friends currently available.  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 She didn't know where she had parked and otherwise disoriented.  They were concerned she had taken too many pain pills.  Usually, no problems with cognition.  Workup significant for possible new infarct on CT, hyperglycemia, elevated BUN.  UDS positive for opiate and benzo. Patient reports she drank "about an inch" of liquor yesterday. Says she came looking for help for back pain.  MRI pending.   PT Comments   Similar cognition to yesterday. Spoke with her brother who is concerned that she is not completely back to her baseline mentally. They do admit that she is very independently minded. She was able to ambulate approx 120 ft today with RW however her pain in her leg seemed to gradually increase. She reports that Dr. Franky Macho will be coming to see her. In the chance that she does go home prior to surgery or other intervention I did discuss with her and her family present that I would recommend she use the RW for more stability in addition to having family supervise her at home. It seems her brother is worried about her overuse of her pain medication (the patient mentioned this to me).   Follow Up Recommendations  Home health PT;Supervision/Assistance - 24 hour     Does the patient have the potential to tolerate intense rehabilitation     Barriers to Discharge        Equipment Recommendations  Rolling walker with 5" wheels    Recommendations for Other Services    Frequency Min 3X/week    Progress towards PT Goals Progress towards PT goals: Progressing toward goals  Plan Current plan remains appropriate;Frequency needs to be updated    Precautions / Restrictions Precautions Precautions: Fall Restrictions Weight Bearing Restrictions: No   Pertinent Vitals/Pain Reports 6/10 back pain that gradually worsened with ambulation, RN made aware    Mobility  Bed Mobility Bed Mobility: Not assessed Transfers Transfers: Sit to Stand;Stand to Sit Sit to Stand: With upper extremity assist;5: Supervision Stand to Sit: With upper extremity assist;5: Supervision Details for Transfer Assistance: cues for safe hand placement and use of RW, pt rather impulsive; cues for safety and tall posture Ambulation/Gait Ambulation/Gait Assistance: 4: Min guard Ambulation Distance (Feet): 120 Feet Assistive device: Rolling walker Ambulation/Gait Assistance Details: cues for tall posture and safe use/proximity to RW Gait Pattern: Step-through pattern;Trunk flexed General Gait Details: pushes RW too far anteriorly with decreased ability to problem solve safe use of this device Modified Rankin (Stroke Patients Only) Pre-Morbid Rankin Score: No significant disability Modified Rankin: Moderately severe disability      PT Goals (current goals can now be found in the care plan section)    Visit Information  Last PT Received On: 12/11/12 Assistance Needed: +1 History of Present Illness: Sheila Cisneros is a 72 y.o. female brought to the ED by colleagues who found patient wandering around, confused.  Hx is per EDP and record review.  No family or friends currently available.  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 She didn't know where she had parked and otherwise disoriented.  They were concerned she had taken too many pain pills.  Usually, no problems with cognition.  Workup significant for possible new infarct on CT, hyperglycemia, elevated BUN.  UDS  positive for opiate and benzo. Patient reports she drank "about an inch" of liquor yesterday. Says she came looking for help for back pain.  MRI pending.    Subjective Data      Cognition  Cognition Arousal/Alertness: Awake/alert Behavior During Therapy: Impulsive Overall Cognitive Status: Impaired/Different from baseline Area of Impairment: Safety/judgement Current Attention Level: Alternating Safety/Judgement: Decreased awareness of safety;Decreased awareness of deficits General Comments: per brother she still doesn't seem like herself, the patient gets upset and defensive when he makes reference to this    Balance     End of Session PT - End of Session Equipment Utilized During Treatment: Gait belt Activity Tolerance: Patient tolerated treatment well;Patient limited by pain Patient left: in chair;with call bell/phone within reach;with family/visitor present Nurse Communication: Mobility status;Patient requests pain meds   GP     Ludger Nutting 12/11/2012, 4:22 PM

## 2012-12-11 NOTE — Progress Notes (Signed)
[facility] Progress note  Sheila Cisneros:[id number] DOB: 01/10/1941 DOA: 12/09/2012  Referring physician: Clarene Duke PCP: Alva Garnet., MD   Chief Complaint: back pain  HPI: Sheila Cisneros is a 72 y.o. female PMHx gout, hepatitis, HTN, hyperglycemia, bronchiectasis brought to the ED by colleagues who found patient wandering around, confused.  Hx is per EDP and record review.  No family or friends currently available.  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 She didn't know where she had parked and otherwise disoriented.  They were concerned she had taken too many pain pills.  Usually, no problems with cognition.  Workup significant for possible new infarct on CT, hyperglycemia, elevated BUN.  UDS positive for opiate and benzo. Patient reports she drank "about an inch" of liquor yesterday. Says she came looking for help for back pain. 12/10/2012 states the only reason she came to the hospital was for treatment for her lower back. Positive chronic back pain, States fell in the bathtub on Sunday/Monday which resulted in increased back pain with radiation down bilateral legs Rt> Lt. patient also has had a productive cough which is worsened, positive increasing SOB TODAY WBC had decreased to 11.8 from 13.1. Patient states back pain has improved but still rates 7/10. States did participate in physical therapy today    Procedure  CT head without con 12/09/2012 1. Hypoattenuation within the anterior basal ganglia bilaterally.  Although subtly apparent, at least on the right, in 2006, new or  progressive subacute ischemia cannot be entirely excluded. If the  patient has localizing symptoms to this area, consider MRI.  2. New mild small vessel ischemic change.  3. Advanced sinus disease.   Brain MRI without contrast 12/10/2012 IMPRESSION:  Atrophy and moderate chronic microvascular ischemia. No acute  intracranial abnormality.  Sinusitis with  air-fluid level.  L-spine 12/10/2012 stable when compared to L-spine 11/26/2012  Findings: Numbering is based on prior MRI. There is a transitional  largely lumbarized S1 segment. There is a stable degenerative  anterolisthesis at L5-S1 with associated advanced disc space loss.  Disc space loss and facet disease are noted throughout the lumbar  spine associated with a biconvex scoliosis. There is no evidence  of acute fracture or pars defect.  IMPRESSION:  Stable thoracolumbar scoliosis with degenerative anterolisthesis at  L5-S1. S1 is transitional. No acute findings identified.     Past Medical History  Diagnosis Date  . Gout, unspecified   . Other chronic sinusitis   . Unspecified chronic bronchitis     sees Dr. Melba Coon, last visit 08/2011, treated for E. Coli- resp. /w Ceftin, Feb. 2013  . Hypertension   . GERD (gastroesophageal reflux disease)     rolaids if needed  . COPD (chronic obstructive pulmonary disease)   . Pneumonia     "couple times; long time ago" (12/09/2012)  . Chronic bronchitis     "couple times/yr" (12/09/2012)  . Exertional shortness of breath   . History of blood transfusion     "related to a surgery, I think" (12/09/2012)  . Hepatitis, unspecified     "the one that's not bad" (12/09/2012)  . Migraines     "in the past" (12/09/2012)  . Osteoarthrosis, unspecified whether generalized or localized, unspecified site   . Arthritis     "all over" (12/09/2012)  . Chronic lower back pain   . Altered mental status 12/09/2012   Past Surgical History  Procedure Laterality Date  .  Tubal ligation    . Tonsillectomy    . Shoulder arthroscopy w/ rotator cuff repair Right   . Carpal tunnel release  09/06/2011    Procedure: CARPAL TUNNEL RELEASE;  Surgeon: Kennieth Rad, MD;  Location: [facility] OR;  Service: Orthopedics;  Laterality: Right;  . Abdominal hysterectomy      "w/right ovary" (12/09/2012)  . Appendectomy    . Breast biopsy Right    Social History:  reports  that she has never smoked. She has never used smokeless tobacco. She reports that  drinks alcohol. She reports that she does not use illicit drugs.  Allergies  Allergen Reactions  . Codeine Rash     angioedema  . Penicillins Itching and Rash       . Tetanus Toxoids Rash  . Levofloxacin Rash    Family History  Problem Relation Age of Onset  . Heart disease Father   . ALS Brother    Prior to Admission medications   Medication Sig Start Date End Date Taking? Authorizing Provider  acetaminophen (TYLENOL) 500 MG tablet Take 500 mg by mouth every 6 (six) hours as needed. For pain   Yes Historical Provider, MD  albuterol (PROAIR HFA) 108 (90 BASE) MCG/ACT inhaler Inhale 2 puffs into the lungs every 6 (six) hours as needed for wheezing or shortness of breath.    Yes Historical Provider, MD  albuterol (PROVENTIL) (2.5 MG/3ML) 0.083% nebulizer solution Take 2.5 mg by nebulization every 6 (six) hours as needed. For wheezing or shortness of breath   Yes Historical Provider, MD  Ascorbic Acid (VITAMIN C) 500 MG tablet Take 500 mg by mouth daily.     Yes Historical Provider, MD  benzonatate (TESSALON) 100 MG capsule Take 100 mg by mouth 2 (two) times daily.   Yes Historical Provider, MD  BLACK COHOSH PO Take 1 tablet by mouth daily.     Yes Historical Provider, MD  cholecalciferol (VITAMIN D) 1000 UNITS tablet Take 1,000 Units by mouth daily.   Yes Historical Provider, MD  clarithromycin (BIAXIN) 500 MG tablet 1 twice daily after meals 11/20/12  Yes Waymon Budge, MD  donepezil (ARICEPT) 5 MG tablet Take 5 mg by mouth at bedtime.   Yes Historical Provider, MD  etodolac (LODINE) 400 MG tablet Take 400 mg by mouth daily.     Yes Historical Provider, MD  fexofenadine-pseudoephedrine (ALLEGRA-D 12 HOUR) 60-120 MG per tablet Take 1 tablet by mouth 2 (two) times daily.     Yes Historical Provider, MD  fish oil-omega-3 fatty acids 1000 MG capsule Take 2 g by mouth daily.   Yes Historical Provider, MD   furosemide (LASIX) 40 MG tablet Take 40 mg by mouth daily.   Yes Historical Provider, MD  HYDROcodone-acetaminophen (NORCO/VICODIN) 5-325 MG per tablet Take 1 tablet by mouth as needed for pain.   Yes Historical Provider, MD  IRON PO Take 1 tablet by mouth daily.   Yes Historical Provider, MD  Lecithin 1200 MG CAPS Take 2,400 mg by mouth daily.   Yes Historical Provider, MD  Multiple Vitamin (MULTIVITAMIN) capsule Take 1 capsule by mouth daily.     Yes Historical Provider, MD  potassium chloride SA (K-DUR,KLOR-CON) 20 MEQ tablet Take 20 mEq by mouth 2 (two) times daily.     Yes Historical Provider, MD  simvastatin (ZOCOR) 20 MG tablet Take 20 mg by mouth at bedtime.     Yes Historical Provider, MD  [facility] Wort 300 MG CAPS Take 300 mg by  mouth daily.   Yes Historical Provider, MD  traMADol (ULTRAM) 50 MG tablet Take 1 tablet (50 mg total) by mouth every 8 (eight) hours as needed for pain. 11/09/12  Yes Sena Hitch, MD  triamterene-hydrochlorothiazide (MAXZIDE-25) 37.5-25 MG per tablet Take 1 tablet by mouth daily.     Yes Historical Provider, MD  venlafaxine (EFFEXOR) 37.5 MG tablet Take 37.5 mg by mouth daily as needed (hot flashes).   Yes Historical Provider, MD  verapamil (COVERA HS) 240 MG (CO) 24 hr tablet Take 240 mg by mouth at bedtime.     Yes Historical Provider, MD  chlorpheniramine-HYDROcodone (TUSSIONEX) 10-8 MG/5ML LQCR Take 5 mLs by mouth every 12 (twelve) hours as needed. 09/01/12   Waymon Budge, MD   Physical Exam: Filed Vitals:   12/11/12 0500  BP: 129/69  Pulse: 59  Temp: 98.3 F (36.8 C)  Resp: 20   BP 129/69  Pulse 59  Temp(Src) 98.3 F (36.8 C) (Oral)  Resp 20  Ht 4\' 11"  (1.499 m)  Wt 67.042 kg (147 lb 12.8 oz)  BMI 29.84 kg/m2  SpO2 99%     Physical exam  Gen.; Alert, NAD  Cardiac; regular rhythm and rate, negative murmurs rubs gallops, DP/PT pulse one plus bilateral  Pulmonary; bilateral diffuse rales in all lung fields   Musculoskeletal;  positive left lower back muscle spasms in the paraspinal muscles and lower latissimus. Positive midline pain L3-L5,, positive right SI joint pain to palpation, right trochanteric bursa pain to palpation,  pain with standing, patient dragging right foot with ambulation. However muscle spasms have improved and pain has decreased from yesterday's exam   Neurologic; within normal limits     Basic Metabolic Panel:  Recent Labs Lab 12/09/12 1253 12/09/12 1930 12/10/12 0618  NA 141  --  139  K 3.5  --  3.7  CL 104  --  106  CO2 25  --  23  GLUCOSE 239*  --  151*  BUN 41*  --  26*  CREATININE 1.06 0.86 0.75  CALCIUM 9.8  --  9.1   Liver Function Tests:  Recent Labs Lab 12/09/12 1253  AST 17  ALT 22  ALKPHOS 85  BILITOT 0.1*  PROT 7.2  ALBUMIN 3.1*   No results found for this basename: LIPASE, AMYLASE,  in the last 168 hours  Recent Labs Lab 12/09/12 1900  AMMONIA 12   CBC:  Recent Labs Lab 12/09/12 1253 12/09/12 1930 12/10/12 0618  WBC 13.1* 12.7* 13.1*  NEUTROABS 11.7*  --  9.7*  HGB 12.2 12.2 11.4*  HCT 34.9* 34.8* 32.3*  MCV 91.4 91.1 90.5  PLT 273 267 263   Cardiac Enzymes:  Recent Labs Lab 12/09/12 1254  TROPONINI <0.30    BNP (last 3 results)  Recent Labs  11/09/12 1638  PROBNP 47.3   CBG:  Recent Labs Lab 12/10/12 1615 12/10/12 2206 12/11/12 0012 12/11/12 0407 12/11/12 0758  GLUCAP 159* 139* 252* 121* 155*    Radiological Exams on Admission: Dg Lumbar Spine 2-3 Views  12/10/2012   CLINICAL DATA:  Low back and bilateral leg pain and paresthesias.  EXAM: LUMBAR SPINE - 2-3 VIEW  COMPARISON:  11/26/2012.  FINDINGS: Stable previously described scoliosis and degenerative changes.  IMPRESSION: Stable previously described scoliosis and degenerative changes. No acute abnormality.   Electronically Signed   By: Gordan Payment   On: 12/10/2012 22:07   Ct Head Wo Contrast  12/09/2012   *RADIOLOGY REPORT*  Clinical Data: Altered  mental status.   CT HEAD WITHOUT CONTRAST  Technique:  Contiguous axial images were obtained from the base of the skull through the vertex without contrast.  Comparison: Brain MR 03/07/2012 (report not available).  Most recent CT of 07/17/2004  Findings: Bone windows demonstrate fluid within bilateral maxillary sinuses.  Fluid in the sphenoid sinus with mucosal thickening of ethmoid air cells and complete opacification of frontal sinuses. Clear mastoid air cells.  Aerated petrous apices.  Soft tissue windows demonstrate mild low density in the periventricular white matter likely related to small vessel disease.This is increased. Hypoattenuation within the basal ganglia, along the anterior limbs of the internal capsules.  Slightly greater on the right than left. Present (but less conspicuous) on the right and likely present on the left in 2006.  Otherwise, no  mass lesion, hemorrhage, hydrocephalus, acute infarct, intra-axial, or extra-axial fluid collection.  IMPRESSION:  1.  Hypoattenuation within the anterior basal ganglia bilaterally. Although subtly apparent, at least on the right, in 2006, new or progressive subacute ischemia cannot be entirely excluded.  If the patient has localizing symptoms to this area, consider MRI. 2.  New mild small vessel ischemic change. 3.  Advanced sinus disease.   Original Report Authenticated By: Jeronimo Greaves, M.D.   Mr Brain Wo Contrast  12/10/2012   *RADIOLOGY REPORT*  Clinical Data: Altered mental status  MRI HEAD WITHOUT CONTRAST  Technique:  Multiplanar, multiecho pulse sequences of the brain and surrounding structures were obtained according to standard protocol without intravenous contrast.  Comparison: CT 12/09/2012  Findings: Age appropriate atrophy.  Negative for hydrocephalus. Chronic microvascular ischemic changes throughout the white matter extending into the internal capsule external capsule and basal ganglia.  Chronic ischemia in the thalamus and pons bilaterally.  Negative for  acute infarct.  Negative for hemorrhage or mass lesion.  Mucosal thickening throughout the paranasal sinuses.  Air-fluid level maxillary  and sphenoid sinus.  Vessels at the base of the brain are patent.  IMPRESSION: Atrophy and moderate chronic microvascular ischemia.  No acute intracranial abnormality.  Sinusitis with air-fluid level.   Original Report Authenticated By: Janeece Riggers, M.D.   Dg Chest Portable 1 View  12/09/2012   *RADIOLOGY REPORT*  Clinical Data: Altered mental status  PORTABLE CHEST - 1 VIEW  Comparison: 11/09/2012 and 02/04/2012  Findings: Cardiac leads project over the chest.  A moderate convex right scoliosis of the mid/lower thoracic and visualized lumbar spine is again noted.  Lung volumes are low bilaterally.  There is streaky bibasilar atelectasis.  No definite airspace disease is identified.  No visible pleural effusion or pneumothorax.  There are degenerative changes of both shoulders.  IMPRESSION:  1.  Streaky bibasilar atelectasis and low lung volumes. 2. Chronic moderate convex right scoliosis of the thoracic and lumbar spine.   Original Report Authenticated By: Britta Mccreedy, M.D.   Assessment/Plan    Confusion: Resolved patient alert and oriented x4,     HYPERTENSION:  Within AHA guidelines     Back pain, acute/chronic; counseled patient L-spine x-ray showed degenerative  anterolisthesis at L5-S1 with associated advanced disc space loss.  ;biconvex scoliosis.; thoracolumbar scoliosis with degenerative anterolisthesis at  L5-S1. Marland Kitchen No acute findings identified. --Increased tramadol and added Flexeril    Hyperglycemia: no h/o DM. Check hgb a1c, CBGs, continue moderate SSI with steroid burst    Dehydration: Continue IVF    Bronchiectasis with acute exacerbation; antibiotics plus a steroid burst. Continue nebulizer treatment, added pulmonary toilet    GERD (gastroesophageal reflux  disease); stable  Alzheimer's; continue Aricept  Code Status: full Family  Communication: none available Disposition Plan: home  Time spent: 40 minutes  Drema Dallas [facility] Pager [phone]  If 7PM-7AM, please contact night-coverage [email or web address] Password [facility] 12/11/2012, 8:29 AM

## 2012-12-12 DIAGNOSIS — M4120 Other idiopathic scoliosis, site unspecified: Secondary | ICD-10-CM | POA: Diagnosis present

## 2012-12-12 LAB — COMPREHENSIVE METABOLIC PANEL
Albumin: 2.3 g/dL — ABNORMAL LOW (ref 3.5–5.2)
Alkaline Phosphatase: 63 U/L (ref 39–117)
BUN: 14 mg/dL (ref 6–23)
Calcium: 8.4 mg/dL (ref 8.4–10.5)
Potassium: 4.3 mEq/L (ref 3.5–5.1)
Total Protein: 5.6 g/dL — ABNORMAL LOW (ref 6.0–8.3)

## 2012-12-12 LAB — MAGNESIUM: Magnesium: 1.7 mg/dL (ref 1.5–2.5)

## 2012-12-12 LAB — CBC WITH DIFFERENTIAL/PLATELET
Basophils Relative: 0 % (ref 0–1)
Eosinophils Absolute: 0 10*3/uL (ref 0.0–0.7)
Hemoglobin: 10.8 g/dL — ABNORMAL LOW (ref 12.0–15.0)
MCH: 31.8 pg (ref 26.0–34.0)
MCHC: 34.4 g/dL (ref 30.0–36.0)
Monocytes Relative: 8 % (ref 3–12)
Neutrophils Relative %: 60 % (ref 43–77)
Platelets: 261 10*3/uL (ref 150–400)

## 2012-12-12 LAB — GLUCOSE, CAPILLARY
Glucose-Capillary: 180 mg/dL — ABNORMAL HIGH (ref 70–99)
Glucose-Capillary: 139 mg/dL — ABNORMAL HIGH (ref 70–99)

## 2012-12-12 NOTE — Progress Notes (Addendum)
[facility] Progress note  Sheila Cisneros:[id number] DOB: 1940/12/14 DOA: 12/09/2012  Referring physician: Clarene Duke PCP: Alva Garnet., MD   Chief Complaint: back pain  HPI: Sheila Cisneros is a 72 y.o. female PMHx gout, hepatitis, HTN, hyperglycemia, bronchiectasis brought to the ED by colleagues who found patient wandering around, confused.  Hx is per EDP and record review.  No family or friends currently available.  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 She didn't know where she had parked and otherwise disoriented.  They were concerned she had taken too many pain pills.  Usually, no problems with cognition.  Workup significant for possible new infarct on CT, hyperglycemia, elevated BUN.  UDS positive for opiate and benzo. Patient reports she drank "about an inch" of liquor yesterday. Says she came looking for help for back pain. 12/10/2012 states the only reason she came to the hospital was for treatment for her lower back. Positive chronic back pain, States fell in the bathtub on Sunday/Monday which resulted in increased back pain with radiation down bilateral legs Rt> Lt. patient also has had a productive cough which is worsened, positive increasing SOB 12/11/2012 WBC had decreased to 11.8 from 13.1. Patient states back pain has improved but still rates 7/10. States did participate in physical therapy today. TODAY  WBC count increasing to 14.2 however PMN percentage decreased (WBC increase most likely secondary to steroids). Patient states breathing is improved, and back pain has improved also.     Procedure  CT head without con 12/09/2012 1. Hypoattenuation within the anterior basal ganglia bilaterally.  Although subtly apparent, at least on the right, in 2006, new or  progressive subacute ischemia cannot be entirely excluded. If the  patient has localizing symptoms to this area, consider MRI.  2. New mild small vessel ischemic change.   3. Advanced sinus disease.   Brain MRI without contrast 12/10/2012 IMPRESSION:  Atrophy and moderate chronic microvascular ischemia. No acute  intracranial abnormality.  Sinusitis with air-fluid level.  L-spine 12/10/2012 stable when compared to L-spine 11/26/2012  Findings: Numbering is based on prior MRI. There is a transitional  largely lumbarized S1 segment. There is a stable degenerative  anterolisthesis at L5-S1 with associated advanced disc space loss.  Disc space loss and facet disease are noted throughout the lumbar  spine associated with a biconvex scoliosis. There is no evidence  of acute fracture or pars defect.  IMPRESSION:  Stable thoracolumbar scoliosis with degenerative anterolisthesis at  L5-S1. S1 is transitional. No acute findings identified.     Past Medical History  Diagnosis Date  . Gout, unspecified   . Other chronic sinusitis   . Unspecified chronic bronchitis     sees Dr. Melba Coon, last visit 08/2011, treated for E. Coli- resp. /w Ceftin, Feb. 2013  . Hypertension   . GERD (gastroesophageal reflux disease)     rolaids if needed  . COPD (chronic obstructive pulmonary disease)   . Pneumonia     "couple times; long time ago" (12/09/2012)  . Chronic bronchitis     "couple times/yr" (12/09/2012)  . Exertional shortness of breath   . History of blood transfusion     "related to a surgery, I think" (12/09/2012)  . Hepatitis, unspecified     "the one that's not bad" (12/09/2012)  . Migraines     "in the past" (12/09/2012)  . Osteoarthrosis, unspecified whether generalized or localized, unspecified site   .  Arthritis     "all over" (12/09/2012)  . Chronic lower back pain   . Altered mental status 12/09/2012   Past Surgical History  Procedure Laterality Date  . Tubal ligation    . Tonsillectomy    . Shoulder arthroscopy w/ rotator cuff repair Right   . Carpal tunnel release  09/06/2011    Procedure: CARPAL TUNNEL RELEASE;  Surgeon: Kennieth Rad, MD;   Location: [facility] OR;  Service: Orthopedics;  Laterality: Right;  . Abdominal hysterectomy      "w/right ovary" (12/09/2012)  . Appendectomy    . Breast biopsy Right    Social History:  reports that she has never smoked. She has never used smokeless tobacco. She reports that  drinks alcohol. She reports that she does not use illicit drugs.  Allergies  Allergen Reactions  . Codeine Rash     angioedema  . Penicillins Itching and Rash       . Tetanus Toxoids Rash  . Levofloxacin Rash    Family History  Problem Relation Age of Onset  . Heart disease Father   . ALS Brother    Prior to Admission medications   Medication Sig Start Date End Date Taking? Authorizing Provider  acetaminophen (TYLENOL) 500 MG tablet Take 500 mg by mouth every 6 (six) hours as needed. For pain   Yes Historical Provider, MD  albuterol (PROAIR HFA) 108 (90 BASE) MCG/ACT inhaler Inhale 2 puffs into the lungs every 6 (six) hours as needed for wheezing or shortness of breath.    Yes Historical Provider, MD  albuterol (PROVENTIL) (2.5 MG/3ML) 0.083% nebulizer solution Take 2.5 mg by nebulization every 6 (six) hours as needed. For wheezing or shortness of breath   Yes Historical Provider, MD  Ascorbic Acid (VITAMIN C) 500 MG tablet Take 500 mg by mouth daily.     Yes Historical Provider, MD  benzonatate (TESSALON) 100 MG capsule Take 100 mg by mouth 2 (two) times daily.   Yes Historical Provider, MD  BLACK COHOSH PO Take 1 tablet by mouth daily.     Yes Historical Provider, MD  cholecalciferol (VITAMIN D) 1000 UNITS tablet Take 1,000 Units by mouth daily.   Yes Historical Provider, MD  clarithromycin (BIAXIN) 500 MG tablet 1 twice daily after meals 11/20/12  Yes Waymon Budge, MD  donepezil (ARICEPT) 5 MG tablet Take 5 mg by mouth at bedtime.   Yes Historical Provider, MD  etodolac (LODINE) 400 MG tablet Take 400 mg by mouth daily.     Yes Historical Provider, MD  fexofenadine-pseudoephedrine (ALLEGRA-D 12 HOUR) 60-120 MG  per tablet Take 1 tablet by mouth 2 (two) times daily.     Yes Historical Provider, MD  fish oil-omega-3 fatty acids 1000 MG capsule Take 2 g by mouth daily.   Yes Historical Provider, MD  furosemide (LASIX) 40 MG tablet Take 40 mg by mouth daily.   Yes Historical Provider, MD  HYDROcodone-acetaminophen (NORCO/VICODIN) 5-325 MG per tablet Take 1 tablet by mouth as needed for pain.   Yes Historical Provider, MD  IRON PO Take 1 tablet by mouth daily.   Yes Historical Provider, MD  Lecithin 1200 MG CAPS Take 2,400 mg by mouth daily.   Yes Historical Provider, MD  Multiple Vitamin (MULTIVITAMIN) capsule Take 1 capsule by mouth daily.     Yes Historical Provider, MD  potassium chloride SA (K-DUR,KLOR-CON) 20 MEQ tablet Take 20 mEq by mouth 2 (two) times daily.     Yes Historical Provider, MD  simvastatin (ZOCOR) 20 MG tablet Take 20 mg by mouth at bedtime.     Yes Historical Provider, MD  [name] Wort 300 MG CAPS Take 300 mg by mouth daily.   Yes Historical Provider, MD  traMADol (ULTRAM) 50 MG tablet Take 1 tablet (50 mg total) by mouth every 8 (eight) hours as needed for pain. 11/09/12  Yes Sena Hitch, MD  triamterene-hydrochlorothiazide (MAXZIDE-25) 37.5-25 MG per tablet Take 1 tablet by mouth daily.     Yes Historical Provider, MD  venlafaxine (EFFEXOR) 37.5 MG tablet Take 37.5 mg by mouth daily as needed (hot flashes).   Yes Historical Provider, MD  verapamil (COVERA HS) 240 MG (CO) 24 hr tablet Take 240 mg by mouth at bedtime.     Yes Historical Provider, MD  chlorpheniramine-HYDROcodone (TUSSIONEX) 10-8 MG/5ML LQCR Take 5 mLs by mouth every 12 (twelve) hours as needed. 09/01/12   Waymon Budge, MD   Physical Exam: Filed Vitals:   12/12/12 0951  BP: 117/66  Pulse: 71  Temp: 98 F (36.7 C)  Resp: 18   BP 117/66  Pulse 71  Temp(Src) 98 F (36.7 C) (Oral)  Resp 18  Ht 4\' 11"  (1.499 m)  Wt 67.042 kg (147 lb 12.8 oz)  BMI 29.84 kg/m2  SpO2 100%     Physical exam  Gen.; Alert,  NAD  Cardiac; regular rhythm and rate, negative murmurs rubs gallops, DP/PT pulse one plus bilateral  Pulmonary; bilateral diffuse rales in all lung fields, positive mild inspiratory/expiratory wheezing, however apices of lungs are much clearer than yesterday   Musculoskeletal; positive left lower back muscle spasms in the paraspinal muscles and lower latissimus. Positive midline pain L3-L5,, positive right SI joint pain to palpation, right trochanteric bursa pain to palpation. Patient able to stand today almost no pain. Muscle spasms have improved and pain has decreased from yesterday's exam   Neurologic; within normal limits     Basic Metabolic Panel:  Recent Labs Lab 12/09/12 1253 12/09/12 1930 12/10/12 0618 12/12/12 0552  NA 141  --  139 139  K 3.5  --  3.7 4.3  CL 104  --  106 109  CO2 25  --  23 21  GLUCOSE 239*  --  151* 125*  BUN 41*  --  26* 14  CREATININE 1.06 0.86 0.75 0.78  CALCIUM 9.8  --  9.1 8.4  MG  --   --   --  1.7   Liver Function Tests:  Recent Labs Lab 12/09/12 1253 12/12/12 0552  AST 17 12  ALT 22 15  ALKPHOS 85 63  BILITOT 0.1* 0.2*  PROT 7.2 5.6*  ALBUMIN 3.1* 2.3*   No results found for this basename: LIPASE, AMYLASE,  in the last 168 hours  Recent Labs Lab 12/09/12 1900  AMMONIA 12   CBC:  Recent Labs Lab 12/09/12 1253 12/09/12 1930 12/10/12 0618 12/11/12 1143 12/12/12 0552  WBC 13.1* 12.7* 13.1* 11.8* 14.2*  NEUTROABS 11.7*  --  9.7* 8.3* 8.5*  HGB 12.2 12.2 11.4* 11.2* 10.8*  HCT 34.9* 34.8* 32.3* 32.1* 31.4*  MCV 91.4 91.1 90.5 91.2 92.4  PLT 273 267 263 252 261   Cardiac Enzymes:  Recent Labs Lab 12/09/12 1254  TROPONINI <0.30    BNP (last 3 results)  Recent Labs  11/09/12 1638  PROBNP 47.3   CBG:  Recent Labs Lab 12/11/12 0758 12/11/12 1137 12/11/12 1638 12/11/12 2103 12/12/12 0651  GLUCAP 155* 126* 150* 155* 112*  Radiological Exams on Admission: Dg Lumbar Spine 2-3 Views  12/10/2012    CLINICAL DATA:  Low back and bilateral leg pain and paresthesias.  EXAM: LUMBAR SPINE - 2-3 VIEW  COMPARISON:  11/26/2012.  FINDINGS: Stable previously described scoliosis and degenerative changes.  IMPRESSION: Stable previously described scoliosis and degenerative changes. No acute abnormality.   Electronically Signed   By: Gordan Payment   On: 12/10/2012 22:07   Mr Brain Wo Contrast  12/10/2012   *RADIOLOGY REPORT*  Clinical Data: Altered mental status  MRI HEAD WITHOUT CONTRAST  Technique:  Multiplanar, multiecho pulse sequences of the brain and surrounding structures were obtained according to standard protocol without intravenous contrast.  Comparison: CT 12/09/2012  Findings: Age appropriate atrophy.  Negative for hydrocephalus. Chronic microvascular ischemic changes throughout the white matter extending into the internal capsule external capsule and basal ganglia.  Chronic ischemia in the thalamus and pons bilaterally.  Negative for acute infarct.  Negative for hemorrhage or mass lesion.  Mucosal thickening throughout the paranasal sinuses.  Air-fluid level maxillary  and sphenoid sinus.  Vessels at the base of the brain are patent.  IMPRESSION: Atrophy and moderate chronic microvascular ischemia.  No acute intracranial abnormality.  Sinusitis with air-fluid level.   Original Report Authenticated By: Janeece Riggers, M.D.   Assessment/Plan    Confusion: Resolved patient alert and oriented x4,     HYPERTENSION:  Within AHA guidelines     Back pain, acute/chronic; counseled patient L-spine x-ray showed degenerative  anterolisthesis at L5-S1 with associated advanced disc space loss.  ;biconvex scoliosis.; thoracolumbar scoliosis with degenerative anterolisthesis at  L5-S1. Marland Kitchen No acute findings identified. --Increased tramadol and added Flexeril which allow patient to participate with physical therapy    Hyperglycemia: no h/o DM.  Continue to Check CBGs, fairly well controlled on moderate SSI with  steroid burst    Dehydration: Continue IVF    Bronchiectasis with acute exacerbation; antibiotics plus a steroid burst. Continue nebulizer treatment, continue pulmonary toilet. Spoke with pulmonology Tech, concerning using active percussion, they will reevaluate today --Patient using flutter valve which has helped with chest congestion    GERD (gastroesophageal reflux disease); stable  Alzheimer's; continue Aricept  Code Status: full Family Communication: none available Disposition Plan: home  Time spent: 40 minutes  Drema Dallas [facility] Pager [phone]  If 7PM-7AM, please contact night-coverage [email or web address] Password [facility] 12/12/2012, 10:03 AM

## 2012-12-12 NOTE — Progress Notes (Addendum)
Physical Therapy Treatment Patient Details Name: Sheila Cisneros MRN: [id number] DOB: 1940-08-11 Today's Date: 12/12/2012 Time: 4696-2952 PT Time Calculation (min): 36 min  PT Assessment / Plan / Recommendation  History of Present Illness Sheila Cisneros is a 72 y.o. female brought to the ED by colleagues who found patient wandering around, confused.  Hx is per EDP and record review.  No family or friends currently available.  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 She didn't know where she had parked and otherwise disoriented.  They were concerned she had taken too many pain pills.  Usually, no problems with cognition.  Workup significant for possible new infarct on CT, hyperglycemia, elevated BUN.  UDS positive for opiate and benzo. Patient reports she drank "about an inch" of liquor yesterday. Says she came looking for help for back pain.  MRI pending.   PT Comments   Appears much clearer today. Better able to follow commands and control impulses. Appears pain is more controlled today however still complaining of shooting pains into her sacrum and right PSIS area. Patient also able to start stair training today. Needs to review safe mobility at home (with RW and steps) as pt will likely not have 24 hour assist. Patient also would like to look into possibility of shower chair/bench.   Follow Up Recommendations  Home health PT;Supervision for mobility/OOB     Does the patient have the potential to tolerate intense rehabilitation     Barriers to Discharge        Equipment Recommendations  Rolling walker with 5" wheels (tub bench vs shower chair (TBD next session))    Recommendations for Other Services    Frequency Min 5X/week   Progress towards PT Goals Progress towards PT goals: Progressing toward goals  Plan Discharge plan needs to be updated    Precautions / Restrictions Precautions Precautions: Fall Restrictions Weight Bearing Restrictions:  No   Pertinent Vitals/Pain Pain increases when standing, RN aware and providing pain meds following our session    Mobility  Bed Mobility Bed Mobility: Rolling Right;Right Sidelying to Sit Rolling Right: 4: Min guard Right Sidelying to Sit: 4: Min guard;HOB flat Details for Bed Mobility Assistance: step by step verbal cues for log roll technique to reduce pressure on her back Transfers Transfers: Sit to Stand;Stand to Sit Sit to Stand: 4: Min guard;With upper extremity assist Stand to Sit: 4: Min guard;With upper extremity assist Details for Transfer Assistance: cues for tall posture and safe hand placement Ambulation/Gait Ambulation/Gait Assistance: 4: Min guard;5: Supervision Ambulation Distance (Feet): 180 Feet Assistive device: Rolling walker Ambulation/Gait Assistance Details: cues for tall posture and tight tummy as well as rests when needed Gait Pattern: Step-through pattern;Trunk flexed Stairs: Yes Stairs Assistance: 4: Min guard Stairs Assistance Details (indicate cue type and reason): ascended 2 steps with RUE on rail and LUE HHA to simulate railing with "mingaurdA" (althoug I was holding her hand); then ascended 2 steps sideways with railing needing only mingaurdA, cues for sequencing and gaurding for stability Stair Management Technique: Step to pattern;Sideways;Forwards;One rail Right;Other (comment) (\) Number of Stairs: 4 (2x2) Modified Rankin (Stroke Patients Only) Pre-Morbid Rankin Score: No significant disability Modified Rankin: Moderately severe disability    Exercises Other Exercises Other Exercises:  TA activation (flat tummy) x10, cues for technique (supine) Other Exercises: gentle lumbar rotation in supine x 10    PT Goals (current goals can now be found in the  care plan section)    Visit Information  Last PT Received On: 12/12/12 Assistance Needed: +1 History of Present Illness: Sheila Cisneros is a 72 y.o. female brought to the ED by colleagues who  found patient wandering around, confused.  Hx is per EDP and record review.  No family or friends currently available.  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 She didn't know where she had parked and otherwise disoriented.  They were concerned she had taken too many pain pills.  Usually, no problems with cognition.  Workup significant for possible new infarct on CT, hyperglycemia, elevated BUN.  UDS positive for opiate and benzo. Patient reports she drank "about an inch" of liquor yesterday. Says she came looking for help for back pain.  MRI pending.    Subjective Data      Cognition  Cognition Arousal/Alertness: Awake/alert Behavior During Therapy: WFL for tasks assessed/performed Overall Cognitive Status: Within Functional Limits for tasks assessed    Balance     End of Session PT - End of Session Equipment Utilized During Treatment: Gait belt Activity Tolerance: Patient tolerated treatment well Patient left: in chair;with call bell/phone within reach Nurse Communication: Mobility status;Patient requests pain meds   GP     Ludger Nutting 12/12/2012, 5:01 PM

## 2012-12-13 DIAGNOSIS — M4120 Other idiopathic scoliosis, site unspecified: Secondary | ICD-10-CM

## 2012-12-13 LAB — CBC WITH DIFFERENTIAL/PLATELET
Basophils Relative: 0 % (ref 0–1)
Eosinophils Absolute: 0 10*3/uL (ref 0.0–0.7)
HCT: 31.1 % — ABNORMAL LOW (ref 36.0–46.0)
Hemoglobin: 10.7 g/dL — ABNORMAL LOW (ref 12.0–15.0)
Lymphs Abs: 4 10*3/uL (ref 0.7–4.0)
MCH: 31.6 pg (ref 26.0–34.0)
MCHC: 34.4 g/dL (ref 30.0–36.0)
Monocytes Absolute: 1.1 10*3/uL — ABNORMAL HIGH (ref 0.1–1.0)
Monocytes Relative: 8 % (ref 3–12)

## 2012-12-13 LAB — COMPREHENSIVE METABOLIC PANEL
Albumin: 2.3 g/dL — ABNORMAL LOW (ref 3.5–5.2)
BUN: 18 mg/dL (ref 6–23)
Chloride: 108 mEq/L (ref 96–112)
Creatinine, Ser: 0.77 mg/dL (ref 0.50–1.10)
GFR calc Af Amer: >90 mL/min (ref >90)
Glucose, Bld: 113 mg/dL — ABNORMAL HIGH (ref 70–99)
Total Bilirubin: 0.2 mg/dL — ABNORMAL LOW (ref 0.3–1.2)
Total Protein: 5.5 g/dL — ABNORMAL LOW (ref 6.0–8.3)

## 2012-12-13 LAB — GLUCOSE, CAPILLARY
Glucose-Capillary: 110 mg/dL — ABNORMAL HIGH (ref 70–99)
Glucose-Capillary: 92 mg/dL (ref 70–99)

## 2012-12-13 LAB — MAGNESIUM: Magnesium: 1.7 mg/dL (ref 1.5–2.5)

## 2012-12-13 MED ORDER — TRIAMTERENE-HCTZ 37.5-25 MG PO TABS
1.0000 | ORAL_TABLET | Freq: Every day | ORAL | Status: DC
  Administered 2012-12-13: 1 via ORAL
  Filled 2012-12-13 (×2): qty 1

## 2012-12-13 MED ORDER — DIPHENHYDRAMINE HCL 25 MG PO CAPS
25.0000 mg | ORAL_CAPSULE | Freq: Four times a day (QID) | ORAL | Status: DC | PRN
  Administered 2012-12-13: 25 mg via ORAL
  Filled 2012-12-13: qty 1

## 2012-12-13 NOTE — Progress Notes (Signed)
   CARE MANAGEMENT NOTE 12/13/2012  Patient:  Sheila Cisneros, Sheila Cisneros   Account Number:  [id number]  Date Initiated:  12/13/2012  Documentation initiated by:  [facility]  Subjective/Objective Assessment:   adm w/ back pain     Action/Plan:   Anticipated DC Date:  12/13/2012   Anticipated DC Plan:        DC Planning Services  CM consult      [facility] Choice  HOME HEALTH   Choice offered to / List presented to:     DME arranged  SHOWER STOOL  WALKER - ROLLING      DME agency  [facility]     HH arranged  HH-2 PT      [facility] agency  [facility]   Status of service:   Medicare Important Message given?   (If response is "NO", the following Medicare IM given date fields will be blank) Date Medicare IM given:   Date Additional Medicare IM given:    Discharge Disposition:  HOME W HOME HEALTH SERVICES  Per UR Regulation:    If discussed at Long Length of Stay Meetings, dates discussed:    Comments:  12/13/12 14:00 CM spoke to pt to verify address and contact information and offer choice for home health PT.  Pt chose [facility] which she has had in the past. Referral to [facility] for HHPT was faxed.  RN called for Rolling walker to be brought to room.  No other CM needs were communicated.  Freddy Jaksch, BSN, CM [phone].

## 2012-12-13 NOTE — Progress Notes (Signed)
Physical Therapy Treatment Patient Details Name: FAIGE SEELY MRN: [id number] DOB: [birth year] Today's Date: 12/13/2012 Time: 4098-1191 PT Time Calculation (min): 24 min  PT Assessment / Plan / Recommendation  History of Present Illness CEAIRA ERNSTER is a 72 y.o. female brought to the ED by colleagues who found patient wandering around, confused.  Hx is per EDP and record review.  No family or friends currently available.  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 She didn't know where she had parked and otherwise disoriented.  They were concerned she had taken too many pain pills.  Usually, no problems with cognition.  Workup significant for possible new infarct on CT, hyperglycemia, elevated BUN.  UDS positive for opiate and benzo. Patient reports she drank "about an inch" of liquor yesterday. Says she came looking for help for back pain.  MRI pending.   PT Comments   Patient progressing well.  Follow Up Recommendations  Home health PT;Supervision for mobility/OOB     Does the patient have the potential to tolerate intense rehabilitation     Barriers to Discharge        Equipment Recommendations  Rolling walker with 5" wheels (Shower seat with back)    Recommendations for Other Services    Frequency Min 5X/week   Progress towards PT Goals Progress towards PT goals: Progressing toward goals  Plan Current plan remains appropriate    Precautions / Restrictions Precautions Precautions: Fall Restrictions Weight Bearing Restrictions: No   Pertinent Vitals/Pain     Mobility  Bed Mobility Bed Mobility: Not assessed Transfers Transfers: Sit to Stand;Stand to Sit Sit to Stand: 5: Supervision;With upper extremity assist;With armrests;From chair/3-in-1;From toilet Stand to Sit: 5: Supervision;With upper extremity assist;With armrests;To chair/3-in-1;To toilet Details for Transfer Assistance: Verbal cues to bring RW with her all the way back to  chair when sitting. Ambulation/Gait Ambulation/Gait Assistance: 5: Supervision Ambulation Distance (Feet): 200 Feet Assistive device: Rolling walker Ambulation/Gait Assistance Details: Cues to stand upright. Gait Pattern: Step-through pattern;Trunk flexed Gait velocity: Slow gait speed Stairs: Yes Stairs Assistance: 4: Min guard Stairs Assistance Details (indicate cue type and reason): Reviewed proper technique. Stair Management Technique: Step to pattern;Sideways;Forwards;One rail Right;Other (comment) Number of Stairs: 4 Modified Rankin (Stroke Patients Only) Pre-Morbid Rankin Score: No significant disability Modified Rankin: Moderate disability      PT Goals (current goals can now be found in the care plan section)    Visit Information  Last PT Received On: 12/13/12 Assistance Needed: +1 History of Present Illness: RAYYA YAGI is a 72 y.o. female brought to the ED by colleagues who found patient wandering around, confused.  Hx is per EDP and record review.  No family or friends currently available.  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 She didn't know where she had parked and otherwise disoriented.  They were concerned she had taken too many pain pills.  Usually, no problems with cognition.  Workup significant for possible new infarct on CT, hyperglycemia, elevated BUN.  UDS positive for opiate and benzo. Patient reports she drank "about an inch" of liquor yesterday. Says she came looking for help for back pain.  MRI pending.    Subjective Data  Subjective: My back still hurts.   Cognition  Cognition Arousal/Alertness: Awake/alert Behavior During Therapy: WFL for tasks assessed/performed Overall Cognitive Status: Within Functional Limits for tasks assessed  Balance     End of Session PT - End of Session Equipment Utilized During Treatment: Gait belt Activity Tolerance: Patient tolerated treatment well Patient left: in  chair;with call bell/phone within reach Nurse Communication: Mobility status;Patient requests pain meds   GP     Vena Austria 12/13/2012, 1:13 PM Durenda Hurt. Renaldo Fiddler, [facility] Acute Rehab Services Pager [phone]

## 2012-12-13 NOTE — Progress Notes (Signed)
[facility] Progress note  Sheila Cisneros MWU:[id number] DOB: 1940-10-16 DOA: 12/09/2012  Referring physician: Clarene Duke PCP: Alva Garnet., MD   Chief Complaint: back pain  HPI: Sheila Cisneros is a 72 y.o. female PMHx gout, hepatitis, HTN, hyperglycemia, bronchiectasis brought to the ED by colleagues who found patient wandering around, confused.  Hx is per EDP and record review.  No family or friends currently available.  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 She didn't know where she had parked and otherwise disoriented.  They were concerned she had taken too many pain pills.  Usually, no problems with cognition.  Workup significant for possible new infarct on CT, hyperglycemia, elevated BUN.  UDS positive for opiate and benzo. Patient reports she drank "about an inch" of liquor yesterday. Says she came looking for help for back pain. 12/10/2012 states the only reason she came to the hospital was for treatment for her lower back. Positive chronic back pain, States fell in the bathtub on Sunday/Monday which resulted in increased back pain with radiation down bilateral legs Rt> Lt. patient also has had a productive cough which is worsened, positive increasing SOB 12/11/2012 WBC had decreased to 11.8 from 13.1. Patient states back pain has improved but still rates 7/10. States did participate in physical therapy today. TODAY  WBC count decreased to 13.3 . Patient states breathing is improved, and back pain has improved also. Able to ambulate around unit with rolling walker.      Procedure  CT head without con 12/09/2012 1. Hypoattenuation within the anterior basal ganglia bilaterally.  Although subtly apparent, at least on the right, in 2006, new or  progressive subacute ischemia cannot be entirely excluded. If the  patient has localizing symptoms to this area, consider MRI.  2. New mild small vessel ischemic change.  3. Advanced sinus  disease.   Brain MRI without contrast 12/10/2012 IMPRESSION:  Atrophy and moderate chronic microvascular ischemia. No acute  intracranial abnormality.  Sinusitis with air-fluid level.  L-spine 12/10/2012 stable when compared to L-spine 11/26/2012  Findings: Numbering is based on prior MRI. There is a transitional  largely lumbarized S1 segment. There is a stable degenerative  anterolisthesis at L5-S1 with associated advanced disc space loss.  Disc space loss and facet disease are noted throughout the lumbar  spine associated with a biconvex scoliosis. There is no evidence  of acute fracture or pars defect.  IMPRESSION:  Stable thoracolumbar scoliosis with degenerative anterolisthesis at  L5-S1. S1 is transitional. No acute findings identified.   Antibiotics Clindamycin 8/27, day 4/7   Past Medical History  Diagnosis Date  . Gout, unspecified   . Other chronic sinusitis   . Unspecified chronic bronchitis     sees Dr. Melba Coon, last visit 08/2011, treated for E. Coli- resp. /w Ceftin, Feb. 2013  . Hypertension   . GERD (gastroesophageal reflux disease)     rolaids if needed  . COPD (chronic obstructive pulmonary disease)   . Pneumonia     "couple times; long time ago" (12/09/2012)  . Chronic bronchitis     "couple times/yr" (12/09/2012)  . Exertional shortness of breath   . History of blood transfusion     "related to a surgery, I think" (12/09/2012)  . Hepatitis, unspecified     "the one that's not bad" (12/09/2012)  . Migraines     "in the past" (12/09/2012)  . Osteoarthrosis, unspecified whether generalized or localized, unspecified  site   . Arthritis     "all over" (12/09/2012)  . Chronic lower back pain   . Altered mental status 12/09/2012   Past Surgical History  Procedure Laterality Date  . Tubal ligation    . Tonsillectomy    . Shoulder arthroscopy w/ rotator cuff repair Right   . Carpal tunnel release  09/06/2011    Procedure: CARPAL TUNNEL RELEASE;  Surgeon:  Kennieth Rad, MD;  Location: [facility] OR;  Service: Orthopedics;  Laterality: Right;  . Abdominal hysterectomy      "w/right ovary" (12/09/2012)  . Appendectomy    . Breast biopsy Right    Social History:  reports that she has never smoked. She has never used smokeless tobacco. She reports that  drinks alcohol. She reports that she does not use illicit drugs.  Allergies  Allergen Reactions  . Codeine Rash     angioedema  . Penicillins Itching and Rash       . Tetanus Toxoids Rash  . Levofloxacin Rash    Family History  Problem Relation Age of Onset  . Heart disease Father   . ALS Brother    Prior to Admission medications   Medication Sig Start Date End Date Taking? Authorizing Provider  acetaminophen (TYLENOL) 500 MG tablet Take 500 mg by mouth every 6 (six) hours as needed. For pain   Yes Historical Provider, MD  albuterol (PROAIR HFA) 108 (90 BASE) MCG/ACT inhaler Inhale 2 puffs into the lungs every 6 (six) hours as needed for wheezing or shortness of breath.    Yes Historical Provider, MD  albuterol (PROVENTIL) (2.5 MG/3ML) 0.083% nebulizer solution Take 2.5 mg by nebulization every 6 (six) hours as needed. For wheezing or shortness of breath   Yes Historical Provider, MD  Ascorbic Acid (VITAMIN C) 500 MG tablet Take 500 mg by mouth daily.     Yes Historical Provider, MD  benzonatate (TESSALON) 100 MG capsule Take 100 mg by mouth 2 (two) times daily.   Yes Historical Provider, MD  BLACK COHOSH PO Take 1 tablet by mouth daily.     Yes Historical Provider, MD  cholecalciferol (VITAMIN D) 1000 UNITS tablet Take 1,000 Units by mouth daily.   Yes Historical Provider, MD  clarithromycin (BIAXIN) 500 MG tablet 1 twice daily after meals 11/20/12  Yes Waymon Budge, MD  donepezil (ARICEPT) 5 MG tablet Take 5 mg by mouth at bedtime.   Yes Historical Provider, MD  etodolac (LODINE) 400 MG tablet Take 400 mg by mouth daily.     Yes Historical Provider, MD  fexofenadine-pseudoephedrine  (ALLEGRA-D 12 HOUR) 60-120 MG per tablet Take 1 tablet by mouth 2 (two) times daily.     Yes Historical Provider, MD  fish oil-omega-3 fatty acids 1000 MG capsule Take 2 g by mouth daily.   Yes Historical Provider, MD  furosemide (LASIX) 40 MG tablet Take 40 mg by mouth daily.   Yes Historical Provider, MD  HYDROcodone-acetaminophen (NORCO/VICODIN) 5-325 MG per tablet Take 1 tablet by mouth as needed for pain.   Yes Historical Provider, MD  IRON PO Take 1 tablet by mouth daily.   Yes Historical Provider, MD  Lecithin 1200 MG CAPS Take 2,400 mg by mouth daily.   Yes Historical Provider, MD  Multiple Vitamin (MULTIVITAMIN) capsule Take 1 capsule by mouth daily.     Yes Historical Provider, MD  potassium chloride SA (K-DUR,KLOR-CON) 20 MEQ tablet Take 20 mEq by mouth 2 (two) times daily.  Yes Historical Provider, MD  simvastatin (ZOCOR) 20 MG tablet Take 20 mg by mouth at bedtime.     Yes Historical Provider, MD  [facility] Wort 300 MG CAPS Take 300 mg by mouth daily.   Yes Historical Provider, MD  traMADol (ULTRAM) 50 MG tablet Take 1 tablet (50 mg total) by mouth every 8 (eight) hours as needed for pain. 11/09/12  Yes Sena Hitch, MD  triamterene-hydrochlorothiazide (MAXZIDE-25) 37.5-25 MG per tablet Take 1 tablet by mouth daily.     Yes Historical Provider, MD  venlafaxine (EFFEXOR) 37.5 MG tablet Take 37.5 mg by mouth daily as needed (hot flashes).   Yes Historical Provider, MD  verapamil (COVERA HS) 240 MG (CO) 24 hr tablet Take 240 mg by mouth at bedtime.     Yes Historical Provider, MD  chlorpheniramine-HYDROcodone (TUSSIONEX) 10-8 MG/5ML LQCR Take 5 mLs by mouth every 12 (twelve) hours as needed. 09/01/12   Waymon Budge, MD   Physical Exam: Filed Vitals:   12/13/12 0528  BP: 152/80  Pulse: 74  Temp: 98.3 F (36.8 C)  Resp: 18   BP 152/80  Pulse 74  Temp(Src) 98.3 F (36.8 C) (Oral)  Resp 18  Ht 4\' 11"  (1.499 m)  Wt 67.042 kg (147 lb 12.8 oz)  BMI 29.84 kg/m2  SpO2 99%      Physical exam  Gen.; Alert, NAD  Cardiac; regular rhythm and rate, negative murmurs rubs gallops, DP/PT pulse one plus bilateral  Pulmonary; coarse breath sounds diffusely however greatly improved from admission   Musculoskeletal; positive left lower back muscle spasms in the paraspinal muscles and lower latissimus. Positive midline pain L3-L5,, positive right SI joint pain to palpation, right trochanteric bursa pain to palpation. Patient able to ambulate around the unit with wheeled walker .   Neurologic; within normal limits     Basic Metabolic Panel:  Recent Labs Lab 12/09/12 1253 12/09/12 1930 12/10/12 0618 12/12/12 0552  NA 141  --  139 139  K 3.5  --  3.7 4.3  CL 104  --  106 109  CO2 25  --  23 21  GLUCOSE 239*  --  151* 125*  BUN 41*  --  26* 14  CREATININE 1.06 0.86 0.75 0.78  CALCIUM 9.8  --  9.1 8.4  MG  --   --   --  1.7   Liver Function Tests:  Recent Labs Lab 12/09/12 1253 12/12/12 0552  AST 17 12  ALT 22 15  ALKPHOS 85 63  BILITOT 0.1* 0.2*  PROT 7.2 5.6*  ALBUMIN 3.1* 2.3*   No results found for this basename: LIPASE, AMYLASE,  in the last 168 hours  Recent Labs Lab 12/09/12 1900  AMMONIA 12   CBC:  Recent Labs Lab 12/09/12 1253 12/09/12 1930 12/10/12 0618 12/11/12 1143 12/12/12 0552 12/13/12 0643  WBC 13.1* 12.7* 13.1* 11.8* 14.2* 13.3*  NEUTROABS 11.7*  --  9.7* 8.3* 8.5* 8.3*  HGB 12.2 12.2 11.4* 11.2* 10.8* 10.7*  HCT 34.9* 34.8* 32.3* 32.1* 31.4* 31.1*  MCV 91.4 91.1 90.5 91.2 92.4 91.7  PLT 273 267 263 252 261 243   Cardiac Enzymes:  Recent Labs Lab 12/09/12 1254  TROPONINI <0.30    BNP (last 3 results)  Recent Labs  11/09/12 1638  PROBNP 47.3   CBG:  Recent Labs Lab 12/12/12 0651 12/12/12 1126 12/12/12 1656 12/12/12 2251 12/13/12 0659  GLUCAP 112* 130* 180* 139* 105*    Radiological Exams on Admission: No results  found. Assessment/Plan    Confusion: Resolved patient alert and oriented x4,      HYPERTENSION:  BP high today, restart patient's Maxide 37.5 -25 mg (home dose)     Back pain, acute/chronic; counseled patient L-spine x-ray showed degenerative  anterolisthesis at L5-S1 with associated advanced disc space loss.  ;biconvex scoliosis.; thoracolumbar scoliosis with degenerative anterolisthesis at  L5-S1. Marland Kitchen No acute findings identified. --Increased tramadol and added Flexeril which allow patient to participate with physical therapy --Continue current pain management therapy which seems to be controlling patient's acute/chronic back pain    Hyperglycemia: no h/o DM.  Continue to Check CBGs, fairly well controlled on moderate SSI with steroid burst    Dehydration: Saline lock     Bronchiectasis with acute exacerbation; continue clindamycin day 4/7, steroid burst complete --Continue nebulizer treatment, continue pulmonary toilet.  --Patient using flutter valve which has helped with chest congestion    GERD (gastroesophageal reflux disease); stable  Alzheimer's; continue Aricept  Code Status: full Family Communication: none available Disposition Plan: home  Time spent: 40 minutes  Drema Dallas [facility] Pager [phone]  If 7PM-7AM, please contact night-coverage [email or web address] Password [email or web address] 12/13/2012, 8:07 AM

## 2012-12-14 DIAGNOSIS — K219 Gastro-esophageal reflux disease without esophagitis: Secondary | ICD-10-CM

## 2012-12-14 DIAGNOSIS — I10 Essential (primary) hypertension: Secondary | ICD-10-CM

## 2012-12-14 LAB — CBC WITH DIFFERENTIAL/PLATELET
HCT: 33.3 % — ABNORMAL LOW (ref 36.0–46.0)
Lymphocytes Relative: 33 % (ref 12–46)
MCHC: 34.2 g/dL (ref 30.0–36.0)
Monocytes Relative: 7 % (ref 3–12)
Neutrophils Relative %: 60 % (ref 43–77)
Platelets: 253 10*3/uL (ref 150–400)
RDW: 13.6 % (ref 11.5–15.5)
WBC: 16 10*3/uL — ABNORMAL HIGH (ref 4.0–10.5)

## 2012-12-14 LAB — COMPREHENSIVE METABOLIC PANEL
ALT: 17 U/L (ref 0–35)
BUN: 18 mg/dL (ref 6–23)
CO2: 23 mEq/L (ref 19–32)
Calcium: 9.2 mg/dL (ref 8.4–10.5)
Creatinine, Ser: 0.94 mg/dL (ref 0.50–1.10)
GFR calc Af Amer: 69 mL/min — ABNORMAL LOW (ref >90)
GFR calc non Af Amer: 60 mL/min — ABNORMAL LOW (ref >90)
Glucose, Bld: 94 mg/dL (ref 70–99)

## 2012-12-14 LAB — GLUCOSE, CAPILLARY: Glucose-Capillary: 128 mg/dL — ABNORMAL HIGH (ref 70–99)

## 2012-12-14 MED ORDER — CLINDAMYCIN HCL 300 MG PO CAPS: 300.0000 mg | ORAL_CAPSULE | Freq: Three times a day (TID) | ORAL | Status: DC

## 2012-12-14 MED ORDER — GUAIFENESIN-DM 100-10 MG/5ML PO SYRP: 5.0000 mL | ORAL_SOLUTION | ORAL | Status: DC | PRN

## 2012-12-14 MED ORDER — ALBUTEROL SULFATE (5 MG/ML) 0.5% IN NEBU: 2.5000 mg | INHALATION_SOLUTION | RESPIRATORY_TRACT | Status: DC | PRN

## 2012-12-14 MED ORDER — CYCLOBENZAPRINE HCL 10 MG PO TABS: 10.0000 mg | ORAL_TABLET | Freq: Three times a day (TID) | ORAL | Status: DC

## 2012-12-14 MED ORDER — TRAMADOL HCL 50 MG PO TABS: 100.0000 mg | ORAL_TABLET | Freq: Three times a day (TID) | ORAL | Status: DC

## 2012-12-14 MED ORDER — ASPIRIN 325 MG PO TBEC: 325.0000 mg | DELAYED_RELEASE_TABLET | Freq: Every day | ORAL | Status: DC

## 2012-12-14 NOTE — Discharge Summary (Signed)
[facility] Discharge summary Sheila Cisneros YQM:[id number] DOB: [birth year] DOA: 12/09/2012  Referring physician: Clarene Duke PCP: Alva Garnet., MD   Chief Complaint: back pain  HPI: Sheila Cisneros is a 72 y.o. female PMHx gout, hepatitis, HTN, hyperglycemia, bronchiectasis brought to the ED by colleagues who found patient wandering around, confused.  Hx is per EDP and record review.  No family or friends currently available.  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 She didn't know where she had parked and otherwise disoriented.  They were concerned she had taken too many pain pills.  Usually, no problems with cognition.  Workup significant for possible new infarct on CT, hyperglycemia, elevated BUN.  UDS positive for opiate and benzo. Patient reports she drank "about an inch" of liquor yesterday. Says she came looking for help for back pain. 12/10/2012 states the only reason she came to the hospital was for treatment for her lower back. Positive chronic back pain, States fell in the bathtub on Sunday/Monday which resulted in increased back pain with radiation down bilateral legs Rt> Lt. patient also has had a productive cough which is worsened, positive increasing SOB 12/11/2012 WBC had decreased to 11.8 from 13.1. Patient states back pain has improved but still rates 7/10. States did participate in physical therapy today. TODAY  BLOOD PRESSURE STILL NOT WITHIN AHA GUIDELINES BUT IMPROVED SIGNIFICANTLY AFTER RESTARTING HER HOME MEDICATION WBC count increased to 16. However patient feels greatly improved from admission. Ambulates around unit with rolling walker. Home PT has been arranged for patient   Procedure  CT head without con 12/09/2012 1. Hypoattenuation within the anterior basal ganglia bilaterally.  Although subtly apparent, at least on the right, in 2006, new or  progressive subacute ischemia cannot be entirely excluded. If the  patient has  localizing symptoms to this area, consider MRI.  2. New mild small vessel ischemic change.  3. Advanced sinus disease.   Brain MRI without contrast 12/10/2012 IMPRESSION:  Atrophy and moderate chronic microvascular ischemia. No acute  intracranial abnormality.  Sinusitis with air-fluid level.  L-spine 12/10/2012 stable when compared to L-spine 11/26/2012  Findings: Numbering is based on prior MRI. There is a transitional  largely lumbarized S1 segment. There is a stable degenerative  anterolisthesis at L5-S1 with associated advanced disc space loss.  Disc space loss and facet disease are noted throughout the lumbar  spine associated with a biconvex scoliosis. There is no evidence  of acute fracture or pars defect.  IMPRESSION:  Stable thoracolumbar scoliosis with degenerative anterolisthesis at  L5-S1. S1 is transitional. No acute findings identified.   Antibiotics Clindamycin 8/27, day 57   Past Medical History  Diagnosis Date  . Gout, unspecified   . Other chronic sinusitis   . Unspecified chronic bronchitis     sees Dr. Melba Coon, last visit 08/2011, treated for E. Coli- resp. /w Ceftin, Feb. 2013  . Hypertension   . GERD (gastroesophageal reflux disease)     rolaids if needed  . COPD (chronic obstructive pulmonary disease)   . Pneumonia     "couple times; long time ago" (12/09/2012)  . Chronic bronchitis     "couple times/yr" (12/09/2012)  . Exertional shortness of breath   . History of blood transfusion     "related to a surgery, I think" (12/09/2012)  . Hepatitis, unspecified     "the one that's not bad" (12/09/2012)  . Migraines     "in the  past" (12/09/2012)  . Osteoarthrosis, unspecified whether generalized or localized, unspecified site   . Arthritis     "all over" (12/09/2012)  . Chronic lower back pain   . Altered mental status 12/09/2012   Past Surgical History  Procedure Laterality Date  . Tubal ligation    . Tonsillectomy    . Shoulder arthroscopy w/  rotator cuff repair Right   . Carpal tunnel release  09/06/2011    Procedure: CARPAL TUNNEL RELEASE;  Surgeon: Kennieth Rad, MD;  Location: [facility] OR;  Service: Orthopedics;  Laterality: Right;  . Abdominal hysterectomy      "w/right ovary" (12/09/2012)  . Appendectomy    . Breast biopsy Right    Social History:  reports that she has never smoked. She has never used smokeless tobacco. She reports that  drinks alcohol. She reports that she does not use illicit drugs.  Allergies  Allergen Reactions  . Codeine Rash     angioedema  . Penicillins Itching and Rash       . Tetanus Toxoids Rash  . Levofloxacin Rash    Family History  Problem Relation Age of Onset  . Heart disease Father   . ALS Brother    Prior to Admission medications   Medication Sig Start Date End Date Taking? Authorizing Provider  acetaminophen (TYLENOL) 500 MG tablet Take 500 mg by mouth every 6 (six) hours as needed. For pain   Yes Historical Provider, MD  albuterol (PROAIR HFA) 108 (90 BASE) MCG/ACT inhaler Inhale 2 puffs into the lungs every 6 (six) hours as needed for wheezing or shortness of breath.    Yes Historical Provider, MD  albuterol (PROVENTIL) (2.5 MG/3ML) 0.083% nebulizer solution Take 2.5 mg by nebulization every 6 (six) hours as needed. For wheezing or shortness of breath   Yes Historical Provider, MD  Ascorbic Acid (VITAMIN C) 500 MG tablet Take 500 mg by mouth daily.     Yes Historical Provider, MD  benzonatate (TESSALON) 100 MG capsule Take 100 mg by mouth 2 (two) times daily.   Yes Historical Provider, MD  BLACK COHOSH PO Take 1 tablet by mouth daily.     Yes Historical Provider, MD  cholecalciferol (VITAMIN D) 1000 UNITS tablet Take 1,000 Units by mouth daily.   Yes Historical Provider, MD  clarithromycin (BIAXIN) 500 MG tablet 1 twice daily after meals 11/20/12  Yes Waymon Budge, MD  donepezil (ARICEPT) 5 MG tablet Take 5 mg by mouth at bedtime.   Yes Historical Provider, MD  etodolac (LODINE)  400 MG tablet Take 400 mg by mouth daily.     Yes Historical Provider, MD  fexofenadine-pseudoephedrine (ALLEGRA-D 12 HOUR) 60-120 MG per tablet Take 1 tablet by mouth 2 (two) times daily.     Yes Historical Provider, MD  fish oil-omega-3 fatty acids 1000 MG capsule Take 2 g by mouth daily.   Yes Historical Provider, MD  furosemide (LASIX) 40 MG tablet Take 40 mg by mouth daily.   Yes Historical Provider, MD  HYDROcodone-acetaminophen (NORCO/VICODIN) 5-325 MG per tablet Take 1 tablet by mouth as needed for pain.   Yes Historical Provider, MD  IRON PO Take 1 tablet by mouth daily.   Yes Historical Provider, MD  Lecithin 1200 MG CAPS Take 2,400 mg by mouth daily.   Yes Historical Provider, MD  Multiple Vitamin (MULTIVITAMIN) capsule Take 1 capsule by mouth daily.     Yes Historical Provider, MD  potassium chloride SA (K-DUR,KLOR-CON) 20 MEQ tablet Take 20  mEq by mouth 2 (two) times daily.     Yes Historical Provider, MD  simvastatin (ZOCOR) 20 MG tablet Take 20 mg by mouth at bedtime.     Yes Historical Provider, MD  [facility] Wort 300 MG CAPS Take 300 mg by mouth daily.   Yes Historical Provider, MD  traMADol (ULTRAM) 50 MG tablet Take 1 tablet (50 mg total) by mouth every 8 (eight) hours as needed for pain. 11/09/12  Yes Sena Hitch, MD  triamterene-hydrochlorothiazide (MAXZIDE-25) 37.5-25 MG per tablet Take 1 tablet by mouth daily.     Yes Historical Provider, MD  venlafaxine (EFFEXOR) 37.5 MG tablet Take 37.5 mg by mouth daily as needed (hot flashes).   Yes Historical Provider, MD  verapamil (COVERA HS) 240 MG (CO) 24 hr tablet Take 240 mg by mouth at bedtime.     Yes Historical Provider, MD  chlorpheniramine-HYDROcodone (TUSSIONEX) 10-8 MG/5ML LQCR Take 5 mLs by mouth every 12 (twelve) hours as needed. 09/01/12   Waymon Budge, MD   Physical Exam: Filed Vitals:   12/14/12 0600  BP: 144/65  Pulse: 64  Temp: 98.5 F (36.9 C)  Resp: 18   BP 144/65  Pulse 64  Temp(Src) 98.5 F (36.9 C)  (Oral)  Resp 18  Ht 4\' 11"  (1.499 m)  Wt 67.042 kg (147 lb 12.8 oz)  BMI 29.84 kg/m2  SpO2 98%     Physical exam  Gen.; Alert, NAD  Cardiac; regular rhythm and rate, negative murmurs rubs gallops, DP/PT pulse one plus bilateral  Pulmonary; coarse breath sounds diffusely however greatly improved from admission   Musculoskeletal; positive left lower back muscle spasms in the paraspinal muscles and lower latissimus. Positive midline pain L3-L5,, positive right SI joint pain to palpation, right trochanteric bursa pain to palpation. Patient able to ambulate around the unit with wheeled walker .   Neurologic; within normal limits     Basic Metabolic Panel:  Recent Labs Lab 12/09/12 1253 12/09/12 1930 12/10/12 0618 12/12/12 0552 12/13/12 0643  NA 141  --  139 139 139  K 3.5  --  3.7 4.3 4.0  CL 104  --  106 109 108  CO2 25  --  23 21 23   GLUCOSE 239*  --  151* 125* 113*  BUN 41*  --  26* 14 18  CREATININE 1.06 0.86 0.75 0.78 0.77  CALCIUM 9.8  --  9.1 8.4 8.8  MG  --   --   --  1.7 1.7   Liver Function Tests:  Recent Labs Lab 12/09/12 1253 12/12/12 0552 12/13/12 0643  AST 17 12 11   ALT 22 15 16   ALKPHOS 85 63 62  BILITOT 0.1* 0.2* 0.2*  PROT 7.2 5.6* 5.5*  ALBUMIN 3.1* 2.3* 2.3*   No results found for this basename: LIPASE, AMYLASE,  in the last 168 hours  Recent Labs Lab 12/09/12 1900  AMMONIA 12   CBC:  Recent Labs Lab 12/10/12 0618 12/11/12 1143 12/12/12 0552 12/13/12 0643 12/14/12 0525  WBC 13.1* 11.8* 14.2* 13.3* 16.0*  NEUTROABS 9.7* 8.3* 8.5* 8.3* PENDING  HGB 11.4* 11.2* 10.8* 10.7* 11.4*  HCT 32.3* 32.1* 31.4* 31.1* 33.3*  MCV 90.5 91.2 92.4 91.7 92.2  PLT 263 252 261 243 253   Cardiac Enzymes:  Recent Labs Lab 12/09/12 1254  TROPONINI <0.30    BNP (last 3 results)  Recent Labs  11/09/12 1638  PROBNP 47.3   CBG:  Recent Labs Lab 12/13/12 0659 12/13/12 1138 12/13/12 1617  12/13/12 2149 12/14/12 0702  GLUCAP 105*  110* 92 128* 94    Radiological Exams on Admission: No results found. Assessment/Plan    Confusion: Resolved patient alert and oriented x4,     HYPERTENSION:  BP high today, restart patient's Maxide 37.5 -25 mg (home dose)     Back pain, acute/chronic; counseled patient L-spine x-ray showed degenerative  anterolisthesis at L5-S1 with associated advanced disc space loss.  ;biconvex scoliosis.; thoracolumbar scoliosis with degenerative anterolisthesis at  L5-S1. Marland Kitchen No acute findings identified. --Increased tramadol and added Flexeril which allow patient to participate with physical therapy --Continue current pain management therapy which seems to be controlling patient's acute/chronic back pain --Home PT has been arranged, the patient also counseled she will need to reengage at her pain clinic for her chronic back pain    Hyperglycemia: no h/o DM.  Continue to Check CBGs, fairly well controlled on moderate SSI with steroid burst    Dehydration: Saline lock     Bronchiectasis with acute exacerbation; continue clindamycin day 5/7, steroid burst complete --Continue nebulizer treatment, continue pulmonary toilet.  --Patient using flutter valve which has helped with chest congestion -Patient seen by [facility] pulmonology will have her schedule followup upon discharge    GERD (gastroesophageal reflux disease); stable  Alzheimer's; continue Aricept  Code Status: full Family Communication: none available Disposition Plan: home  Time spent: 40 minutes  Drema Dallas [facility] Pager [phone]  If 7PM-7AM, please contact night-coverage [email or web address] Password [email or web address] 12/14/2012, 7:23 AM

## 2012-12-17 LAB — CULTURE, BLOOD (ROUTINE X 2): Culture: NO GROWTH

## 2012-12-18 ENCOUNTER — Ambulatory Visit (INDEPENDENT_AMBULATORY_CARE_PROVIDER_SITE_OTHER): Payer: Medicare Other | Admitting: Internal Medicine

## 2012-12-18 ENCOUNTER — Encounter: Payer: Self-pay | Admitting: Internal Medicine

## 2012-12-18 VITALS — BP 120/78 | HR 76 | Ht 59.0 in | Wt 146.0 lb

## 2012-12-18 DIAGNOSIS — J440 Chronic obstructive pulmonary disease with acute lower respiratory infection: Secondary | ICD-10-CM

## 2012-12-18 DIAGNOSIS — R410 Disorientation, unspecified: Secondary | ICD-10-CM

## 2012-12-18 DIAGNOSIS — F29 Unspecified psychosis not due to a substance or known physiological condition: Secondary | ICD-10-CM

## 2012-12-18 NOTE — Patient Instructions (Addendum)
Try elevating your legs whenever you sit, so the water will get out of your legs.   Compare your medicines with the medicine list from the hospital, then take ALL of your meds in a bag when you go to see Dr Renae Gloss so she can help decide what to stay on.   We know you always have cough and chest rattle. If it changes or gets worse, let me know.

## 2012-12-18 NOTE — Progress Notes (Signed)
Patient ID: Sheila Cisneros, female    DOB: [birth year], 72 y.o.   MRN: [id number] HPI  07/31/10- 70 yoF followed for chronic bronchitis/ bronchiectasis and hx rhinosinusitis. Never smoker. Last here- January 30, 2010. She remained congested throough the winter, but she put up with it. No events needing medical visits. Pollen now makes cough somewhat more constant. Rarely productive. Denies fever or night sweats. Nebulizer still helps, used about twice daily. Denies  Chest pain or headache.   09/05/10- Chronic bronchitis, bronchiectasis, hx rhinosinusitis Reports increased chest congestion,producitve cough dark yellow.  Denies fever or sweat.She apparently didn't actually get a script for Daliresp last visit. We discussed side effects again. She is trying to exercise- water aerobics and walking.  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  Acute visit. Cough worse in past week. Was put on prednisone 2 weeks ago for hip- didn't help chest-  pred ended yesterday. Occasional hot flash but no sustained fever. Continues Daliresp.  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  She notices a little routine cough but it has been more productive with some darkening sputum and thicker mucus in the last week. No fever, sore throat or sweats. Sinuses feel normal. We had previously tried [employer]aliresp- she remembers it kept her awake at night. We talked about trying it again if necessary to suppress her repeated bronchitis.  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  She comes for an acute visit. She has been around a lot of family recently. 2 days ago she thinks she went outdoors too quickly after her bath and may have gotten chills. Next on waking, she noted sore throat no fever no stomach upset. No  sinus congestion but sniffing a lot. Mainly complains of chest congestion and rattle cough productive of thick yellow sputum which has been sometimes brown. She started herself on doxycycline. We discussed her earlier experience with Daliresp. She never took it, although she bought it, because she disliked the printed side effect list. I went over those and my own experience with this drug again today. Has had flu shot this year.  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  Has had flu vaccine. After last visit she tried Daliresp but found it too expensive. She keeps her persistent deep chest rattle and coughs scant gray sputum. Does not really feel badly and is able to exercise. Does sometimes cough until she retches. Does not recognize reflux or sinus pain/drainage. She is using her Flutter device twice daily for pulmonary toilet.  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  CT 08/25/10-  1. Bilateral cylindrical type atelectasis within the lower lobes.  2. Bilateral lower lobe airspace densities, likely the sequela of  inflammation or infection. Suggest short-term follow-up  examination to ensure resolution.  3. Scattered small nodules within the right upper lobe are also  likely related to inflammation or infection. A short-term follow-  up examination in 3 months is recommended to ensure resolution.  Original Report Authenticated By: Rosealee AlbeeAYLOR H. STROUD, M.D.  Had flu vax. Mucomyst is not available - prescribed last visit.  Felt well after last here until took chill first of this week. Sore throat, increased productive cough and congestion. Head stuffy, no HA. Sputum cx  02/26/11- grew E. Coli. Septra was called in and helped, after refill she has now been off x 1 week.  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  At last office visit she stopped Daliresp as ineffective. We had given Ceftin for Escherichia coli on sputum culture. She admits cough has been better but remains productive of yellow sputum off and on. Denies chest pain, fever, chills, enlarged nodes.  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 Recently had flare up last week due to pollen but has gotten better now. She noted mostly nasal stuffiness. Not much cough or wheeze and no recent need for antibiotics.  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  Patient states that cough has been "off and on", depends on weather. pt c/o head congestion today Her chronic cough is very sensitive to weather changes. She asked me to evaluate a bulge on her back, demonstrating scoliosis with protrusion of right posterior rib cage. Relatively little productive cough recently and no fever or blood. CXR 02/04/12-reviewed IMPRESSION:  Stable basilar scarring. Stable cardiomegaly. No active lung  disease.  Original Report Authenticated By: Juline Patch, M.D.   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 FOLLOWS FOR:  States breathing "comes and goes."  Congestion this am with gray mucus and wheezing.denies having cold or acute infection. Low back pain, pending injection.  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 Follow up.  C/o back pain and has taken pain med for this.  Prod cough with dark mucus since yesterday.  No f/c/s, wheezing, chest tightness, or chest pain.   CXR 11/09/12 IMPRESSION:  Stable chronic basilar lung disease. No acute cardiopulmonary  process.  Original Report Authenticated By: Carey Bullocks,  M.D.  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 Sister-in-law is here FOLLOWS FOR:  In hospital 8/26 - 8/31 with altered mental status attributed to pain meds.  Still complaining of swelling in both feet/ankles, SOB and cough w/grayish mucus Back pain remains a problem. Brain MRI positive for air-fluid levels in sinuses/ acute sinusitis. She denies facial pain or headache now after treatment with Cleocin and Biaxin. CXR 1 V  8/  /14 IMPRESSION:  1. Streaky bibasilar atelectasis and low lung volumes.  2. 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  Original Report Authenticated By: Britta Mccreedy, M.D.  ROS- see HPI   Constitutional:   No-   weight loss, night sweats, fevers, chills, fatigue, lassitude. HEENT:   No-  headaches, difficulty swallowing, tooth/dental problems, sore throat,       No-  sneezing, itching, ear ache, +nasal congestion, post nasal drip,  CV:  No-   chest pain, orthopnea, PND, swelling in lower extremities, anasarca, dizziness, palpitations Resp: No- acute  shortness of breath with exertion or at rest.              +  productive cough,  + non-productive cough,  No-  coughing up of blood.              +change in color of mucus.  No- wheezing.   Skin: No-   rash or lesions. GI:  No-   heartburn, indigestion, abdominal pain,  nausea, vomiting, GU: MS:  No-   joint pain or swelling.  + Asymmetry right posterior ribs Neuro- nothing unusual Psych:  No- change in mood or affect. No depression or anxiety.  No memory loss.  Objective:   Physical Exam   General- +drowsy from pain medication for back, Oriented, Affect-appropriate, Distress- none acute Skin- rash-none, lesions- none, excoriation- none Lymphadenopathy- none Head- atraumatic            Eyes- Gross vision intact, PERRLA, conjunctivae clear secretions            Ears- Hearing, canals-normal            Nose-  Clear, no-Septal dev, mucus, polyps, erosion, perforation, .            Throat- Mallampati III , mucosa clear , drainage- none, tonsils- atrophic Neck- flexible , trachea midline, no stridor , thyroid nl, carotid no bruit Chest - symmetrical excursion , unlabored           Heart/CV- RRR , no murmur , no gallop  , no rub, nl s1 s2                           - JVD- none , edema+1-2, stasis changes- none, varices- none           Lung-  +coarse upper airway rattle, no wheeze, dullness-none, rub- none, unlabored; room air saturation 96%           Chest wall- +right posterior lower thoracic ribs bulge reflecting scoliosis Abd-  Br/ Gen/ Rectal- Not done, not indicated Extrem- cyanosis- none, clubbing, none, atrophy- none, strength- nl Neuro- grossly intact to observation

## 2012-12-27 NOTE — Assessment & Plan Note (Addendum)
Chronic bronchitis/bronchiectasis is at her baseline. There is always airway noise We discussed maintenance pulmonary toilet again Peripheral edema suggests cor pulmonale but also prolonged sitting. Recommended elevation of legs

## 2012-12-27 NOTE — Assessment & Plan Note (Signed)
Back pain related to scoliosis. She needs to manage medication without suppressing alertness. I asked her to discuss this with her primary physician as scheduled appointment

## 2013-02-17 ENCOUNTER — Encounter: Payer: Self-pay | Admitting: Internal Medicine

## 2013-02-17 ENCOUNTER — Ambulatory Visit (INDEPENDENT_AMBULATORY_CARE_PROVIDER_SITE_OTHER): Payer: Medicare Other | Admitting: Internal Medicine

## 2013-02-17 VITALS — BP 110/66 | HR 106 | Ht 59.0 in | Wt 141.4 lb

## 2013-02-17 DIAGNOSIS — J479 Bronchiectasis, uncomplicated: Secondary | ICD-10-CM

## 2013-02-17 DIAGNOSIS — Z23 Encounter for immunization: Secondary | ICD-10-CM

## 2013-02-17 DIAGNOSIS — M4120 Other idiopathic scoliosis, site unspecified: Secondary | ICD-10-CM

## 2013-02-17 NOTE — Progress Notes (Signed)
Patient ID: Sheila Cisneros, female    DOB: [birth year], 72 y.o.   MRN: [id number] HPI  07/31/10- 70 yoF followed for chronic bronchitis/ bronchiectasis and hx rhinosinusitis. Never smoker. Last here- January 30, 2010. She remained congested throough the winter, but she put up with it. No events needing medical visits. Pollen now makes cough somewhat more constant. Rarely productive. Denies fever or night sweats. Nebulizer still helps, used about twice daily. Denies  Chest pain or headache.   09/05/10- Chronic bronchitis, bronchiectasis, hx rhinosinusitis Reports increased chest congestion,producitve cough dark yellow.  Denies fever or sweat.She apparently didn't actually get a script for Daliresp last visit. We discussed side effects again. She is trying to exercise- water aerobics and walking.  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  Acute visit. Cough worse in past week. Was put on prednisone 2 weeks ago for hip- didn't help chest-  pred ended yesterday. Occasional hot flash but no sustained fever. Continues Daliresp.  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  She notices a little routine cough but it has been more productive with some darkening sputum and thicker mucus in the last week. No fever, sore throat or sweats. Sinuses feel normal. We had previously tried [employer]aliresp- she remembers it kept her awake at night. We talked about trying it again if necessary to suppress her repeated bronchitis.  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  She comes for an acute visit. She has been around a lot of family recently. 2 days ago she thinks she went outdoors too quickly after her bath and may have gotten chills. Next on waking, she noted sore throat no fever no stomach upset. No  sinus congestion but sniffing a lot. Mainly complains of chest congestion and rattle cough productive of thick yellow sputum which has been sometimes brown. She started herself on doxycycline. We discussed her earlier experience with Daliresp. She never took it, although she bought it, because she disliked the printed side effect list. I went over those and my own experience with this drug again today. Has had flu shot this year.  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  Has had flu vaccine. After last visit she tried Daliresp but found it too expensive. She keeps her persistent deep chest rattle and coughs scant gray sputum. Does not really feel badly and is able to exercise. Does sometimes cough until she retches. Does not recognize reflux or sinus pain/drainage. She is using her Flutter device twice daily for pulmonary toilet.  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  CT 08/25/10-  1. Bilateral cylindrical type atelectasis within the lower lobes.  2. Bilateral lower lobe airspace densities, likely the sequela of  inflammation or infection. Suggest short-term follow-up  examination to ensure resolution.  3. Scattered small nodules within the right upper lobe are also  likely related to inflammation or infection. A short-term follow-  up examination in 3 months is recommended to ensure resolution.  Original Report Authenticated By: Rosealee AlbeeAYLOR H. STROUD, M.D.  Had flu vax. Mucomyst is not available - prescribed last visit.  Felt well after last here until took chill first of this week. Sore throat, increased productive cough and congestion. Head stuffy, no HA. Sputum cx  02/26/11- grew E. Coli. Septra was called in and helped, after refill she has now been off x 1 week.  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  At last office visit she stopped Daliresp as ineffective. We had given Ceftin for Escherichia coli on sputum culture. She admits cough has been better but remains productive of yellow sputum off and on. Denies chest pain, fever, chills, enlarged nodes.  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 Recently had flare up last week due to pollen but has gotten better now. She noted mostly nasal stuffiness. Not much cough or wheeze and no recent need for antibiotics.  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  Patient states that cough has been "off and on", depends on weather. pt c/o head congestion today Her chronic cough is very sensitive to weather changes. She asked me to evaluate a bulge on her back, demonstrating scoliosis with protrusion of right posterior rib cage. Relatively little productive cough recently and no fever or blood. CXR 02/04/12-reviewed IMPRESSION:  Stable basilar scarring. Stable cardiomegaly. No active lung  disease.  Original Report Authenticated By: Juline Patch, M.D.   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 FOLLOWS FOR:  States breathing "comes and goes."  Congestion this am with gray mucus and wheezing.denies having cold or acute infection. Low back pain, pending injection.  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 Follow up.  C/o back pain and has taken pain med for this.  Prod cough with dark mucus since yesterday.  No f/c/s, wheezing, chest tightness, or chest pain.   CXR 11/09/12 IMPRESSION:  Stable chronic basilar lung disease. No acute cardiopulmonary  process.  Original Report Authenticated By: Carey Bullocks,  M.D.  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 Sister-in-law is here FOLLOWS FOR:  In hospital 8/26 - 8/31 with altered mental status attributed to pain meds.  Still complaining of swelling in both feet/ankles, SOB and cough w/grayish mucus Back pain remains a problem. Brain MRI positive for air-fluid levels in sinuses/ acute sinusitis. She denies facial pain or headache now after treatment with Cleocin and Biaxin. CXR 1 V  8/  /14 IMPRESSION:  1. Streaky bibasilar atelectasis and low lung volumes.  2. 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  Original Report Authenticated By: Britta Mccreedy, M.D.  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 Sister-in-law is here FOLLOWS FOR: continues to cough-productive at times-clear to brown in color. She reports fainting after getting out of a sauna yesterday. Sounds postural after heat. Ok now.   ROS- see HPI   Constitutional:   No-   weight loss, night sweats, fevers, chills, fatigue, lassitude. HEENT:   No-  headaches, difficulty swallowing, tooth/dental problems, sore throat,       No-  sneezing, itching, ear ache, +nasal congestion, post nasal drip,  CV:  No-   chest pain, orthopnea, PND, swelling in lower extremities, anasarca, dizziness, palpitations Resp: No- acute  shortness of breath with exertion or at rest.              +  productive cough,  +  non-productive cough,  No-  coughing up of blood.              +change in color of mucus.  No- wheezing.   Skin: No-   rash or lesions. GI:  No-   heartburn, indigestion, abdominal pain, nausea, vomiting, GU: MS:  No-   joint pain or swelling.  + Asymmetry right posterior ribs Neuro- nothing unusual Psych:  No- change in mood or affect. No depression or anxiety.  No memory loss.  Objective:   Physical Exam   General-  +drowsy from pain medication for back, Oriented, Affect-appropriate, Distress- none acute Skin- rash-none, lesions- none, excoriation- none Lymphadenopathy- none Head- atraumatic            Eyes- Gross vision intact, PERRLA, conjunctivae clear secretions            Ears- Hearing, canals-normal            Nose- Clear, no-Septal dev, mucus, polyps, erosion, perforation, .            Throat- Mallampati III , mucosa clear , drainage- none, tonsils- atrophic Neck- flexible , trachea midline, no stridor , thyroid nl, carotid no bruit Chest - symmetrical excursion , unlabored           Heart/CV- RRR , no murmur , no gallop  , no rub, nl s1 s2                           - JVD- none , edema+1-2, stasis changes- none, varices- none           Lung-  +deep cough, no wheeze, dullness-none, rub- none, unlabored; room air saturation 96%           Chest wall- +right posterior lower thoracic ribs bulge reflecting scoliosis Abd-  Br/ Gen/ Rectal- Not done, not indicated Extrem- cyanosis- none, clubbing, none, atrophy- none, strength- nl Neuro- grossly intact to observation

## 2013-02-17 NOTE — Patient Instructions (Signed)
Flu vax- standard  I recommend we give you the Prevnar 13 conjugate pneumonia vaccine later  Ask your family doctor about Aricept for memory

## 2013-03-04 ENCOUNTER — Ambulatory Visit: Payer: Medicare Other | Admitting: Internal Medicine

## 2013-03-05 NOTE — Assessment & Plan Note (Signed)
Chronic pattern does not seem worse than usual. Her general health is beginning to feel more as she gets weaker and her memory gets worse. Plan-flu vaccine. We can give pneumonia vaccine conjugate later this winter

## 2013-03-05 NOTE — Assessment & Plan Note (Signed)
These contribute to chronic back pain which further limits her effective cough

## 2013-06-03 ENCOUNTER — Inpatient Hospital Stay (HOSPITAL_COMMUNITY)
Admission: EM | Admit: 2013-06-03 | Discharge: 2013-06-08 | DRG: 378 | Disposition: A | Discharge status: Home Health | Payer: Medicare Other | Attending: Internal Medicine | Admitting: Internal Medicine

## 2013-06-03 ENCOUNTER — Encounter (HOSPITAL_COMMUNITY): Payer: Self-pay | Admitting: Emergency Medicine

## 2013-06-03 DIAGNOSIS — K264 Chronic or unspecified duodenal ulcer with hemorrhage: Principal | ICD-10-CM | POA: Diagnosis present

## 2013-06-03 DIAGNOSIS — L03039 Cellulitis of unspecified toe: Secondary | ICD-10-CM

## 2013-06-03 DIAGNOSIS — M4120 Other idiopathic scoliosis, site unspecified: Secondary | ICD-10-CM

## 2013-06-03 DIAGNOSIS — N179 Acute kidney failure, unspecified: Secondary | ICD-10-CM | POA: Diagnosis present

## 2013-06-03 DIAGNOSIS — K921 Melena: Secondary | ICD-10-CM | POA: Diagnosis present

## 2013-06-03 DIAGNOSIS — K759 Inflammatory liver disease, unspecified: Secondary | ICD-10-CM

## 2013-06-03 DIAGNOSIS — N390 Urinary tract infection, site not specified: Secondary | ICD-10-CM

## 2013-06-03 DIAGNOSIS — R109 Unspecified abdominal pain: Secondary | ICD-10-CM

## 2013-06-03 DIAGNOSIS — K920 Hematemesis: Secondary | ICD-10-CM | POA: Diagnosis present

## 2013-06-03 DIAGNOSIS — R739 Hyperglycemia, unspecified: Secondary | ICD-10-CM

## 2013-06-03 DIAGNOSIS — D649 Anemia, unspecified: Secondary | ICD-10-CM

## 2013-06-03 DIAGNOSIS — E119 Type 2 diabetes mellitus without complications: Secondary | ICD-10-CM | POA: Diagnosis present

## 2013-06-03 DIAGNOSIS — J471 Bronchiectasis with (acute) exacerbation: Secondary | ICD-10-CM | POA: Diagnosis present

## 2013-06-03 DIAGNOSIS — D510 Vitamin B12 deficiency anemia due to intrinsic factor deficiency: Secondary | ICD-10-CM | POA: Diagnosis present

## 2013-06-03 DIAGNOSIS — L02619 Cutaneous abscess of unspecified foot: Secondary | ICD-10-CM

## 2013-06-03 DIAGNOSIS — J328 Other chronic sinusitis: Secondary | ICD-10-CM

## 2013-06-03 DIAGNOSIS — K922 Gastrointestinal hemorrhage, unspecified: Secondary | ICD-10-CM

## 2013-06-03 DIAGNOSIS — G309 Alzheimer's disease, unspecified: Secondary | ICD-10-CM | POA: Diagnosis present

## 2013-06-03 DIAGNOSIS — F0280 Dementia in other diseases classified elsewhere without behavioral disturbance: Secondary | ICD-10-CM

## 2013-06-03 DIAGNOSIS — J42 Unspecified chronic bronchitis: Secondary | ICD-10-CM | POA: Diagnosis present

## 2013-06-03 DIAGNOSIS — J479 Bronchiectasis, uncomplicated: Secondary | ICD-10-CM | POA: Diagnosis present

## 2013-06-03 DIAGNOSIS — I10 Essential (primary) hypertension: Secondary | ICD-10-CM

## 2013-06-03 DIAGNOSIS — M549 Dorsalgia, unspecified: Secondary | ICD-10-CM

## 2013-06-03 DIAGNOSIS — M109 Gout, unspecified: Secondary | ICD-10-CM

## 2013-06-03 DIAGNOSIS — K625 Hemorrhage of anus and rectum: Secondary | ICD-10-CM

## 2013-06-03 DIAGNOSIS — Z79899 Other long term (current) drug therapy: Secondary | ICD-10-CM

## 2013-06-03 DIAGNOSIS — D72829 Elevated white blood cell count, unspecified: Secondary | ICD-10-CM

## 2013-06-03 DIAGNOSIS — D62 Acute posthemorrhagic anemia: Secondary | ICD-10-CM

## 2013-06-03 DIAGNOSIS — E860 Dehydration: Secondary | ICD-10-CM

## 2013-06-03 DIAGNOSIS — J189 Pneumonia, unspecified organism: Secondary | ICD-10-CM

## 2013-06-03 DIAGNOSIS — M48061 Spinal stenosis, lumbar region without neurogenic claudication: Secondary | ICD-10-CM | POA: Diagnosis present

## 2013-06-03 DIAGNOSIS — K219 Gastro-esophageal reflux disease without esophagitis: Secondary | ICD-10-CM

## 2013-06-03 DIAGNOSIS — M1990 Unspecified osteoarthritis, unspecified site: Secondary | ICD-10-CM

## 2013-06-03 DIAGNOSIS — G8929 Other chronic pain: Secondary | ICD-10-CM | POA: Diagnosis present

## 2013-06-03 DIAGNOSIS — R1031 Right lower quadrant pain: Secondary | ICD-10-CM | POA: Diagnosis present

## 2013-06-03 DIAGNOSIS — R410 Disorientation, unspecified: Secondary | ICD-10-CM

## 2013-06-03 DIAGNOSIS — F0390 Unspecified dementia without behavioral disturbance: Secondary | ICD-10-CM | POA: Diagnosis present

## 2013-06-03 DIAGNOSIS — J440 Chronic obstructive pulmonary disease with acute lower respiratory infection: Secondary | ICD-10-CM

## 2013-06-03 HISTORY — DX: Type 2 diabetes mellitus without complications: E11.9

## 2013-06-03 LAB — COMPREHENSIVE METABOLIC PANEL
ALBUMIN: 2.8 g/dL — AB (ref 3.5–5.2)
ALK PHOS: 106 U/L (ref 39–117)
ALT: 7 U/L (ref 0–35)
AST: 12 U/L (ref 0–37)
BUN: 39 mg/dL — AB (ref 6–23)
CO2: 30 mEq/L (ref 19–32)
CREATININE: 1.4 mg/dL — AB (ref 0.50–1.10)
Calcium: 10.1 mg/dL (ref 8.4–10.5)
Chloride: 96 mEq/L (ref 96–112)
GFR calc non Af Amer: 37 mL/min — ABNORMAL LOW (ref >90)
GFR, EST AFRICAN AMERICAN: 42 mL/min — AB (ref >90)
GLUCOSE: 168 mg/dL — AB (ref 70–99)
POTASSIUM: 3.2 meq/L — AB (ref 3.7–5.3)
Sodium: 141 mEq/L (ref 137–147)
TOTAL PROTEIN: 7.3 g/dL (ref 6.0–8.3)
Total Bilirubin: <0.2 mg/dL — ABNORMAL LOW (ref 0.3–1.2)

## 2013-06-03 LAB — CBC
HEMATOCRIT: 24.4 % — AB (ref 36.0–46.0)
HEMOGLOBIN: 7.9 g/dL — AB (ref 12.0–15.0)
MCH: 27.2 pg (ref 26.0–34.0)
MCHC: 32.4 g/dL (ref 30.0–36.0)
MCV: 84.1 fL (ref 78.0–100.0)
Platelets: 355 10*3/uL (ref 150–400)
RBC: 2.9 MIL/uL — ABNORMAL LOW (ref 3.87–5.11)
RDW: 16.9 % — AB (ref 11.5–15.5)
WBC: 11.3 10*3/uL — ABNORMAL HIGH (ref 4.0–10.5)

## 2013-06-03 LAB — OCCULT BLOOD, POC DEVICE: FECAL OCCULT BLD: POSITIVE — AB

## 2013-06-03 LAB — PREPARE RBC (CROSSMATCH)

## 2013-06-03 LAB — ABO/RH: ABO/RH(D): O POS

## 2013-06-03 MED ORDER — SODIUM CHLORIDE 0.9 % IV BOLUS (SEPSIS)
500.0000 mL | Freq: Once | INTRAVENOUS | Status: AC
  Administered 2013-06-03: 500 mL via INTRAVENOUS

## 2013-06-03 MED ORDER — IOHEXOL 300 MG/ML  SOLN
25.0000 mL | Freq: Once | INTRAMUSCULAR | Status: AC | PRN
  Administered 2013-06-03: 25 mL via ORAL

## 2013-06-03 MED ORDER — DIPHENHYDRAMINE HCL 50 MG/ML IJ SOLN
12.5000 mg | Freq: Once | INTRAMUSCULAR | Status: AC
  Administered 2013-06-04: 12.5 mg via INTRAVENOUS
  Filled 2013-06-03: qty 1

## 2013-06-03 MED ORDER — FENTANYL CITRATE 0.05 MG/ML IJ SOLN
50.0000 ug | Freq: Once | INTRAMUSCULAR | Status: AC
  Administered 2013-06-03: 50 ug via INTRAVENOUS
  Filled 2013-06-03: qty 2

## 2013-06-03 MED ORDER — ONDANSETRON HCL 4 MG/2ML IJ SOLN
4.0000 mg | Freq: Once | INTRAMUSCULAR | Status: AC
  Administered 2013-06-03: 4 mg via INTRAVENOUS
  Filled 2013-06-03: qty 2

## 2013-06-03 NOTE — ED Notes (Signed)
Pt c/o rectal bleeding. Noticed dark red blood in stool, and dark red red blood when vomiting. Pain right side flank

## 2013-06-03 NOTE — ED Provider Notes (Signed)
CSN: [id number]     Arrival date & time 06/03/13  2121 History   First MD Initiated Contact with Patient 06/03/13 2157     Chief Complaint  Patient presents with  . Rectal Bleeding      Patient is a 73 y.o. female presenting with hematochezia. The history is provided by the patient.  Rectal Bleeding Quality:  Bright red and black and tarry Amount:  Moderate Duration:  1 day Timing:  Intermittent Progression:  Worsening Chronicity:  New Associated symptoms: abdominal pain, hematemesis and vomiting   Pt reports over past day she has had episodes of bloody stool and dark stool.  She also reports one episode of vomiting blood.  She also reports abd pain She does not take anticoagulants.   No syncope is reported She does not recall having this previously.  Past Medical History  Diagnosis Date  . Gout, unspecified   . Other chronic sinusitis   . Unspecified chronic bronchitis     sees Dr. Melba Coon. Young, last visit 08/2011, treated for E. Coli- resp. /w Ceftin, Feb. 2013  . Hypertension   . GERD (gastroesophageal reflux disease)     rolaids if needed  . COPD (chronic obstructive pulmonary disease)   . Pneumonia     "couple times; long time ago" (12/09/2012)  . Chronic bronchitis     "couple times/yr" (12/09/2012)  . Exertional shortness of breath   . History of blood transfusion     "related to a surgery, I think" (12/09/2012)  . Hepatitis, unspecified     "the one that's not bad" (12/09/2012)  . Migraines     "in the past" (12/09/2012)  . Osteoarthrosis, unspecified whether generalized or localized, unspecified site   . Arthritis     "all over" (12/09/2012)  . Chronic lower back pain   . Altered mental status 12/09/2012   Past Surgical History  Procedure Laterality Date  . Tubal ligation    . Tonsillectomy    . Shoulder arthroscopy w/ rotator cuff repair Right   . Carpal tunnel release  09/06/2011    Procedure: CARPAL TUNNEL RELEASE;  Surgeon: Kennieth RadArthur F Carter, MD;  [facility];  Service: Orthopedics;  Laterality: Right;  . Abdominal hysterectomy      "w/right ovary" (12/09/2012)  . Appendectomy    . Breast biopsy Right    Family History  Problem Relation Age of Onset  . Heart disease Father   . ALS Brother    History  Substance Use Topics  . Smoking status: Never Smoker   . Smokeless tobacco: Never Used  . Alcohol Use: Yes     Comment: 12/09/2012 "have a taste yearly"   OB History   Grav Para Term Preterm Abortions TAB SAB Ect Mult Living                 Review of Systems  Constitutional: Positive for fatigue.  Cardiovascular: Negative for chest pain.  Gastrointestinal: Positive for vomiting, abdominal pain, blood in stool, hematochezia and hematemesis.  Genitourinary: Negative for vaginal bleeding.  Neurological: Positive for weakness. Negative for syncope.  All other systems reviewed and are negative.      Allergies  Codeine; Penicillins; Tetanus toxoids; and Levofloxacin  Home Medications   Current Outpatient Rx  Name  Route  Sig  Dispense  Refill  . acetaminophen (TYLENOL) 500 MG tablet   Oral   Take 500 mg by mouth every 6 (six) hours as needed. For pain         .  albuterol (PROAIR HFA) 108 (90 BASE) MCG/ACT inhaler   Inhalation   Inhale 2 puffs into the lungs every 6 (six) hours as needed for wheezing or shortness of breath.          Marland Kitchen albuterol (PROVENTIL) (5 MG/ML) 0.5% nebulizer solution   Nebulization   Take 0.5 mLs (2.5 mg total) by nebulization every 2 (two) hours as needed for wheezing or shortness of breath.   20 mL   12   . Ascorbic Acid (VITAMIN C) 500 MG tablet   Oral   Take 500 mg by mouth daily.           Marland Kitchen aspirin EC 325 MG EC tablet   Oral   Take 1 tablet (325 mg total) by mouth daily.   30 tablet   0   . benzonatate (TESSALON) 100 MG capsule   Oral   Take 100 mg by mouth 2 (two) times daily.         Marland Kitchen BLACK COHOSH PO   Oral   Take 1 tablet by mouth daily.           .  chlorpheniramine-HYDROcodone (TUSSIONEX) 10-8 MG/5ML LQCR   Oral   Take 5 mLs by mouth every 12 (twelve) hours as needed.   180 mL   0   . cholecalciferol (VITAMIN D) 1000 UNITS tablet   Oral   Take 1,000 Units by mouth daily.         Marland Kitchen donepezil (ARICEPT) 5 MG tablet   Oral   Take 5 mg by mouth at bedtime.         Marland Kitchen etodolac (LODINE) 400 MG tablet   Oral   Take 400 mg by mouth daily.           . fexofenadine-pseudoephedrine (ALLEGRA-D 12 HOUR) 60-120 MG per tablet   Oral   Take 1 tablet by mouth 2 (two) times daily.           . fish oil-omega-3 fatty acids 1000 MG capsule   Oral   Take 2 g by mouth daily.         . furosemide (LASIX) 40 MG tablet   Oral   Take 40 mg by mouth daily.         Marland Kitchen guaiFENesin-dextromethorphan (ROBITUSSIN DM) 100-10 MG/5ML syrup   Oral   Take 5 mLs by mouth every 4 (four) hours as needed for cough.   118 mL   0   . IRON PO   Oral   Take 1 tablet by mouth daily.         . Multiple Vitamin (MULTIVITAMIN) capsule   Oral   Take 1 capsule by mouth daily.           . potassium chloride SA (K-DUR,KLOR-CON) 20 MEQ tablet   Oral   Take 20 mEq by mouth 2 (two) times daily.           . simvastatin (ZOCOR) 20 MG tablet   Oral   Take 20 mg by mouth at bedtime.           . SitaGLIPtin-MetFORMIN HCl (JANUMET PO)   Oral   Take 1 tablet by mouth every evening.         . [place] Wort 300 MG CAPS   Oral   Take 300 mg by mouth daily.         . traMADol (ULTRAM) 50 MG tablet   Oral   Take 2 tablets (100 mg total)  by mouth 3 (three) times daily.   180 tablet   0   . triamterene-hydrochlorothiazide (MAXZIDE-25) 37.5-25 MG per tablet   Oral   Take 1 tablet by mouth daily.           Marland Kitchen venlafaxine (EFFEXOR) 37.5 MG tablet   Oral   Take 37.5 mg by mouth daily as needed (hot flashes).         . verapamil (COVERA HS) 240 MG (CO) 24 hr tablet   Oral   Take 240 mg by mouth at bedtime.            BP 109/47   Pulse 118  Temp(Src) 98.3 F (36.8 C) (Oral)  Resp 18  SpO2 98% BP 107/53  Pulse 93  Temp(Src) 98.3 F (36.8 C) (Oral)  Resp 20  SpO2 100%  Physical Exam CONSTITUTIONAL: Well developed/well nourished HEAD: Normocephalic/atraumatic EYES: EOMI/PERRL ENMT: Mucous membranes moist NECK: supple no meningeal signs CV: S1/S2 noted, no murmurs/rubs/gallops noted LUNGS: Lungs are clear to auscultation bilaterally ABDOMEN: soft, moderate RLQ tenderness noted, no rebound or guarding is noted Rectal - gray stool noted.  No gross blood noted.  Hemoccult positive.  Female chaperone present GU:no cva tenderness NEURO: Pt is awake/alert, moves all extremitiesx4 EXTREMITIES: pulses normal, full ROM SKIN: warm, color normal PSYCH: no abnormalities of mood noted  ED Course  Procedures  11:39 PM Pt here with rectal bleeding, noted to be anemic Will transfuse blood - pt has no objections to receiving blood  She also reports abd pain and on multiple evaluations here she is focally tender to RLQ.  Will also obtain CT imaging.  Current BP remains above 100.  She is currently stable 1:22 AM Pt at CT imaging now.  Will need admission to medicine unless CT imaging positive for surgical process.  She has not any active bleeding here.  Her BP has remained stable.   D/w dr Onalee Hua with triad will admit, pending CT imaging.  GI consultation deferred until after admit due to no active bleeding/pt stable at this time One unit of blood has been ordered and is ready.  Pt has been given IV fluids  Labs Review Labs Reviewed  CBC - Abnormal; Notable for the following:    WBC 11.3 (*)    RBC 2.90 (*)    Hemoglobin 7.9 (*)    HCT 24.4 (*)    RDW 16.9 (*)    All other components within normal limits  COMPREHENSIVE METABOLIC PANEL - Abnormal; Notable for the following:    Potassium 3.2 (*)    Glucose, Bld 168 (*)    BUN 39 (*)    Creatinine, Ser 1.40 (*)    Albumin 2.8 (*)    Total Bilirubin <0.2 (*)     GFR calc non Af Amer 37 (*)    GFR calc Af Amer 42 (*)    All other components within normal limits  OCCULT BLOOD, POC DEVICE - Abnormal; Notable for the following:    Fecal Occult Bld POSITIVE (*)    All other components within normal limits  URINALYSIS, ROUTINE W REFLEX MICROSCOPIC  TYPE AND SCREEN  ABO/RH  PREPARE RBC (CROSSMATCH)     MDM   Final diagnoses:  Rectal bleeding  Abdominal pain  Anemia    Nursing notes including past medical history and social history reviewed and considered in documentation Labs/vital reviewed and considered     Joya Gaskins, MD 06/04/13 0124

## 2013-06-04 ENCOUNTER — Encounter (HOSPITAL_COMMUNITY): Payer: Self-pay | Admitting: Radiology

## 2013-06-04 ENCOUNTER — Encounter (HOSPITAL_COMMUNITY)
Admission: EM | Disposition: A | Discharge status: Home Health | Payer: Self-pay | Source: Home / Self Care | Attending: Internal Medicine

## 2013-06-04 ENCOUNTER — Emergency Department (HOSPITAL_COMMUNITY): Payer: Medicare Other

## 2013-06-04 DIAGNOSIS — D510 Vitamin B12 deficiency anemia due to intrinsic factor deficiency: Secondary | ICD-10-CM | POA: Diagnosis present

## 2013-06-04 DIAGNOSIS — G309 Alzheimer's disease, unspecified: Secondary | ICD-10-CM

## 2013-06-04 DIAGNOSIS — K922 Gastrointestinal hemorrhage, unspecified: Secondary | ICD-10-CM | POA: Diagnosis present

## 2013-06-04 DIAGNOSIS — R52 Pain, unspecified: Secondary | ICD-10-CM

## 2013-06-04 DIAGNOSIS — D62 Acute posthemorrhagic anemia: Secondary | ICD-10-CM

## 2013-06-04 DIAGNOSIS — F0280 Dementia in other diseases classified elsewhere without behavioral disturbance: Secondary | ICD-10-CM

## 2013-06-04 DIAGNOSIS — J479 Bronchiectasis, uncomplicated: Secondary | ICD-10-CM

## 2013-06-04 DIAGNOSIS — K625 Hemorrhage of anus and rectum: Secondary | ICD-10-CM

## 2013-06-04 DIAGNOSIS — R1031 Right lower quadrant pain: Secondary | ICD-10-CM | POA: Diagnosis present

## 2013-06-04 DIAGNOSIS — K759 Inflammatory liver disease, unspecified: Secondary | ICD-10-CM

## 2013-06-04 HISTORY — PX: ESOPHAGOGASTRODUODENOSCOPY: SHX5428

## 2013-06-04 LAB — URINALYSIS, ROUTINE W REFLEX MICROSCOPIC
Bilirubin Urine: NEGATIVE
Glucose, UA: NEGATIVE mg/dL
Hgb urine dipstick: NEGATIVE
Ketones, ur: NEGATIVE mg/dL
NITRITE: POSITIVE — AB
PROTEIN: NEGATIVE mg/dL
SPECIFIC GRAVITY, URINE: 1.017 (ref 1.005–1.030)
UROBILINOGEN UA: 0.2 mg/dL (ref 0.0–1.0)
pH: 6.5 (ref 5.0–8.0)

## 2013-06-04 LAB — BASIC METABOLIC PANEL
BUN: 36 mg/dL — ABNORMAL HIGH (ref 6–23)
CHLORIDE: 98 meq/L (ref 96–112)
CO2: 27 meq/L (ref 19–32)
Calcium: 9.8 mg/dL (ref 8.4–10.5)
Creatinine, Ser: 1.2 mg/dL — ABNORMAL HIGH (ref 0.50–1.10)
GFR calc Af Amer: 51 mL/min — ABNORMAL LOW (ref >90)
GFR calc non Af Amer: 44 mL/min — ABNORMAL LOW (ref >90)
GLUCOSE: 86 mg/dL (ref 70–99)
Potassium: 3.1 mEq/L — ABNORMAL LOW (ref 3.7–5.3)
SODIUM: 140 meq/L (ref 137–147)

## 2013-06-04 LAB — URINE MICROSCOPIC-ADD ON

## 2013-06-04 LAB — HEMOGLOBIN AND HEMATOCRIT, BLOOD
HEMATOCRIT: 25.6 % — AB (ref 36.0–46.0)
HEMOGLOBIN: 8.6 g/dL — AB (ref 12.0–15.0)

## 2013-06-04 LAB — CBC
HEMATOCRIT: 27 % — AB (ref 36.0–46.0)
HEMOGLOBIN: 9.1 g/dL — AB (ref 12.0–15.0)
MCH: 28.4 pg (ref 26.0–34.0)
MCHC: 33.7 g/dL (ref 30.0–36.0)
MCV: 84.4 fL (ref 78.0–100.0)
PLATELETS: 293 10*3/uL (ref 150–400)
RBC: 3.2 MIL/uL — AB (ref 3.87–5.11)
RDW: 15.8 % — ABNORMAL HIGH (ref 11.5–15.5)
WBC: 9.4 10*3/uL (ref 4.0–10.5)

## 2013-06-04 LAB — GLUCOSE, CAPILLARY
GLUCOSE-CAPILLARY: 75 mg/dL (ref 70–99)
Glucose-Capillary: 89 mg/dL (ref 70–99)
Glucose-Capillary: 88 mg/dL (ref 70–99)
Glucose-Capillary: 69 mg/dL — ABNORMAL LOW (ref 70–99)

## 2013-06-04 LAB — PROTIME-INR
INR: 1.17 (ref 0.00–1.49)
Prothrombin Time: 14.7 seconds (ref 11.6–15.2)

## 2013-06-04 SURGERY — EGD (ESOPHAGOGASTRODUODENOSCOPY)
Anesthesia: Moderate Sedation

## 2013-06-04 MED ORDER — DEXTROSE 5 % IV SOLN
500.0000 mg | INTRAVENOUS | Status: DC
  Administered 2013-06-04 – 2013-06-06 (×3): 500 mg via INTRAVENOUS
  Filled 2013-06-04 (×4): qty 500

## 2013-06-04 MED ORDER — SODIUM CHLORIDE 0.9 % IJ SOLN: 3.0000 mL | INTRAMUSCULAR | Status: DC | PRN

## 2013-06-04 MED ORDER — SODIUM CHLORIDE 0.9 % IV SOLN: 250.0000 mL | INTRAVENOUS | Status: DC | PRN

## 2013-06-04 MED ORDER — DIPHENHYDRAMINE HCL 50 MG/ML IJ SOLN
12.5000 mg | Freq: Once | INTRAMUSCULAR | Status: AC
  Administered 2013-06-04: 12.5 mg via INTRAVENOUS
  Filled 2013-06-04: qty 1

## 2013-06-04 MED ORDER — FENTANYL CITRATE 0.05 MG/ML IJ SOLN
INTRAMUSCULAR | Status: DC | PRN
  Administered 2013-06-04: 25 ug via INTRAVENOUS
  Administered 2013-06-04: 12.5 ug via INTRAVENOUS

## 2013-06-04 MED ORDER — MIDAZOLAM HCL 10 MG/2ML IJ SOLN
INTRAMUSCULAR | Status: DC | PRN
  Administered 2013-06-04 (×2): 2 mg via INTRAVENOUS

## 2013-06-04 MED ORDER — IOHEXOL 300 MG/ML  SOLN
100.0000 mL | Freq: Once | INTRAMUSCULAR | Status: AC | PRN
  Administered 2013-06-04: 80 mL via INTRAVENOUS

## 2013-06-04 MED ORDER — ONDANSETRON HCL 4 MG PO TABS: 4.0000 mg | ORAL_TABLET | Freq: Four times a day (QID) | ORAL | Status: DC | PRN

## 2013-06-04 MED ORDER — CEFTRIAXONE SODIUM 1 G IJ SOLR
1.0000 g | INTRAMUSCULAR | Status: DC
  Administered 2013-06-04 – 2013-06-07 (×4): 1 g via INTRAVENOUS
  Filled 2013-06-04 (×5): qty 10

## 2013-06-04 MED ORDER — ALBUTEROL SULFATE (2.5 MG/3ML) 0.083% IN NEBU
2.5000 mg | INHALATION_SOLUTION | RESPIRATORY_TRACT | Status: DC | PRN
  Administered 2013-06-05 – 2013-06-08 (×5): 2.5 mg via RESPIRATORY_TRACT
  Filled 2013-06-04 (×6): qty 3

## 2013-06-04 MED ORDER — FENTANYL CITRATE 0.05 MG/ML IJ SOLN
INTRAMUSCULAR | Status: AC
  Filled 2013-06-04: qty 2

## 2013-06-04 MED ORDER — BUTAMBEN-TETRACAINE-BENZOCAINE 2-2-14 % EX AERO
INHALATION_SPRAY | CUTANEOUS | Status: DC | PRN
  Administered 2013-06-04: 2 via TOPICAL

## 2013-06-04 MED ORDER — DIPHENHYDRAMINE HCL 50 MG/ML IJ SOLN
INTRAMUSCULAR | Status: AC
  Filled 2013-06-04: qty 1

## 2013-06-04 MED ORDER — ACETAMINOPHEN 650 MG RE SUPP: 650.0000 mg | Freq: Four times a day (QID) | RECTAL | Status: DC | PRN

## 2013-06-04 MED ORDER — SODIUM CHLORIDE 0.9 % IJ SOLN
3.0000 mL | Freq: Two times a day (BID) | INTRAMUSCULAR | Status: DC
  Administered 2013-06-06 – 2013-06-07 (×3): 3 mL via INTRAVENOUS

## 2013-06-04 MED ORDER — DIPHENHYDRAMINE HCL 50 MG/ML IJ SOLN: 12.5000 mg | Freq: Once | INTRAMUSCULAR | Status: DC

## 2013-06-04 MED ORDER — PANTOPRAZOLE SODIUM 40 MG IV SOLR
40.0000 mg | Freq: Two times a day (BID) | INTRAVENOUS | Status: DC
  Administered 2013-06-04 – 2013-06-07 (×7): 40 mg via INTRAVENOUS
  Filled 2013-06-04 (×11): qty 40

## 2013-06-04 MED ORDER — SODIUM CHLORIDE 0.9 % IJ SOLN: 3.0000 mL | Freq: Two times a day (BID) | INTRAMUSCULAR | Status: DC

## 2013-06-04 MED ORDER — SODIUM CHLORIDE 0.9 % IV SOLN: INTRAVENOUS | Status: DC

## 2013-06-04 MED ORDER — POTASSIUM CHLORIDE IN NACL 20-0.9 MEQ/L-% IV SOLN
INTRAVENOUS | Status: DC
  Administered 2013-06-04 – 2013-06-05 (×3): via INTRAVENOUS
  Filled 2013-06-04 (×5): qty 1000

## 2013-06-04 MED ORDER — MIDAZOLAM HCL 5 MG/ML IJ SOLN
INTRAMUSCULAR | Status: AC
  Filled 2013-06-04: qty 2

## 2013-06-04 MED ORDER — ONDANSETRON HCL 4 MG/2ML IJ SOLN
4.0000 mg | Freq: Four times a day (QID) | INTRAMUSCULAR | Status: DC | PRN
  Administered 2013-06-06: 4 mg via INTRAVENOUS
  Filled 2013-06-04: qty 2

## 2013-06-04 MED ORDER — INSULIN ASPART 100 UNIT/ML ~~LOC~~ SOLN: 0.0000 [IU] | SUBCUTANEOUS | Status: DC

## 2013-06-04 MED ORDER — ACETAMINOPHEN 325 MG PO TABS
650.0000 mg | ORAL_TABLET | Freq: Four times a day (QID) | ORAL | Status: DC | PRN
  Administered 2013-06-05: 650 mg via ORAL
  Filled 2013-06-04: qty 2

## 2013-06-04 NOTE — ED Notes (Signed)
Pt states that her IV is burning.

## 2013-06-04 NOTE — ED Notes (Signed)
Blood bank has blood ready.

## 2013-06-04 NOTE — H&P (Addendum)
PCP:   Alva GarnetSHELTON,KIMBERLY R., MD   Chief Complaint:  melena  HPI: 73 yo female h/o htn, ?dementia, copd comes in with 2 days of several melanotic stools and rlq abd pain that worsened today.  Today she also had one episode of vomit which had some red color to it.  She has been feeling overall weak.  No dysuria or hematuria.  Had colonoscopy a couple of years ago with polypectomy.  Has h/o hepatitis but no h/o cirrhosis.  No fevers.  On rectal exam in ED stool was not melanotic or bloody but was heme positive.  She has had no vomiting since arrival to ED, no stools.  Pt has h/o dementia in her chart and is on aricept, she is oriented x 4 and seems to be giving appropriate answers without significant cognitive deficits.  Review of Systems:  Positive and negative as per HPI otherwise all other systems are negative  Past Medical History: Past Medical History  Diagnosis Date  . Gout, unspecified   . Other chronic sinusitis   . Unspecified chronic bronchitis     sees Dr. Melba Coon. [place], last visit 08/2011, treated for E. Coli- resp. /w Ceftin, Feb. 2013  . Hypertension   . GERD (gastroesophageal reflux disease)     rolaids if needed  . COPD (chronic obstructive pulmonary disease)   . Pneumonia     "couple times; long time ago" (12/09/2012)  . Chronic bronchitis     "couple times/yr" (12/09/2012)  . Exertional shortness of breath   . History of blood transfusion     "related to a surgery, I think" (12/09/2012)  . Hepatitis, unspecified     "the one that's not bad" (12/09/2012)  . Migraines     "in the past" (12/09/2012)  . Osteoarthrosis, unspecified whether generalized or localized, unspecified site   . Arthritis     "all over" (12/09/2012)  . Chronic lower back pain   . Altered mental status 12/09/2012  . Diabetes mellitus without complication    Past Surgical History  Procedure Laterality Date  . Tubal ligation    . Tonsillectomy    . Shoulder arthroscopy w/ rotator cuff repair Right   .  Carpal tunnel release  09/06/2011    Procedure: CARPAL TUNNEL RELEASE;  Surgeon: Kennieth RadArthur F Carter, MD;  [facility];  Service: Orthopedics;  Laterality: Right;  . Abdominal hysterectomy      "w/right ovary" (12/09/2012)  . Appendectomy    . Breast biopsy Right     Medications: Prior to Admission medications   Medication Sig Start Date End Date Taking? Authorizing Provider  acetaminophen (TYLENOL) 500 MG tablet Take 500 mg by mouth every 6 (six) hours as needed. For pain   Yes Historical Provider, MD  albuterol (PROAIR HFA) 108 (90 BASE) MCG/ACT inhaler Inhale 2 puffs into the lungs every 6 (six) hours as needed for wheezing or shortness of breath.    Yes Historical Provider, MD  albuterol (PROVENTIL) (5 MG/ML) 0.5% nebulizer solution Take 0.5 mLs (2.5 mg total) by nebulization every 2 (two) hours as needed for wheezing or shortness of breath. 12/14/12  Yes Drema Dallasurtis J Woods, MD  Ascorbic Acid (VITAMIN C) 500 MG tablet Take 500 mg by mouth daily.     Yes Historical Provider, MD  aspirin EC 325 MG EC tablet Take 1 tablet (325 mg total) by mouth daily. 12/14/12  Yes Drema Dallasurtis J Woods, MD  benzonatate (TESSALON) 100 MG capsule Take 100 mg by mouth 2 (two) times  daily.   Yes Historical Provider, MD  BLACK COHOSH PO Take 1 tablet by mouth daily.     Yes Historical Provider, MD  chlorpheniramine-HYDROcodone (TUSSIONEX) 10-8 MG/5ML LQCR Take 5 mLs by mouth every 12 (twelve) hours as needed. 09/01/12  Yes Waymon Budge, MD  cholecalciferol (VITAMIN D) 1000 UNITS tablet Take 1,000 Units by mouth daily.   Yes Historical Provider, MD  donepezil (ARICEPT) 5 MG tablet Take 5 mg by mouth at bedtime.   Yes Historical Provider, MD  etodolac (LODINE) 400 MG tablet Take 400 mg by mouth daily.     Yes Historical Provider, MD  fexofenadine-pseudoephedrine (ALLEGRA-D 12 HOUR) 60-120 MG per tablet Take 1 tablet by mouth 2 (two) times daily.     Yes Historical Provider, MD  fish oil-omega-3 fatty acids 1000 MG capsule Take  2 g by mouth daily.   Yes Historical Provider, MD  furosemide (LASIX) 40 MG tablet Take 40 mg by mouth daily.   Yes Historical Provider, MD  guaiFENesin-dextromethorphan (ROBITUSSIN DM) 100-10 MG/5ML syrup Take 5 mLs by mouth every 4 (four) hours as needed for cough. 12/14/12  Yes Drema Dallas, MD  IRON PO Take 1 tablet by mouth daily.   Yes Historical Provider, MD  Multiple Vitamin (MULTIVITAMIN) capsule Take 1 capsule by mouth daily.     Yes Historical Provider, MD  potassium chloride SA (K-DUR,KLOR-CON) 20 MEQ tablet Take 20 mEq by mouth 2 (two) times daily.     Yes Historical Provider, MD  simvastatin (ZOCOR) 20 MG tablet Take 20 mg by mouth at bedtime.     Yes Historical Provider, MD  SitaGLIPtin-MetFORMIN HCl (JANUMET PO) Take 1 tablet by mouth every evening.   Yes Historical Provider, MD  [facility] Wort 300 MG CAPS Take 300 mg by mouth daily.   Yes Historical Provider, MD  traMADol (ULTRAM) 50 MG tablet Take 2 tablets (100 mg total) by mouth 3 (three) times daily. 12/14/12  Yes Drema Dallas, MD  triamterene-hydrochlorothiazide (MAXZIDE-25) 37.5-25 MG per tablet Take 1 tablet by mouth daily.     Yes Historical Provider, MD  venlafaxine (EFFEXOR) 37.5 MG tablet Take 37.5 mg by mouth daily as needed (hot flashes).   Yes Historical Provider, MD  verapamil (COVERA HS) 240 MG (CO) 24 hr tablet Take 240 mg by mouth at bedtime.     Yes Historical Provider, MD    Allergies:   Allergies  Allergen Reactions  . Codeine Rash     angioedema  . Penicillins Itching and Rash       . Tetanus Toxoids Rash  . Levofloxacin Rash    Social History:  reports that she has never smoked. She has never used smokeless tobacco. She reports that she drinks alcohol. She reports that she does not use illicit drugs.  Family History: Family History  Problem Relation Age of Onset  . Heart disease Father   . ALS Brother     Physical Exam: Filed Vitals:   06/03/13 2230 06/04/13 0004 06/04/13 0030 06/04/13  0100  BP: 109/47 103/56 114/54 107/53  Pulse:   85 93  Temp:      TempSrc:      Resp: 18 22 21 20   SpO2: 98% 100% 99% 100%   General appearance: alert, cooperative and no distress Head: Normocephalic, without obvious abnormality, atraumatic Eyes: negative Nose: Nares normal. Septum midline. Mucosa normal. No drainage or sinus tenderness. Neck: no JVD and supple, symmetrical, trachea midline Lungs: diminished breath sounds bibasilar Heart:  regular rate and rhythm, S1, S2 normal, no murmur, click, rub or gallop Abdomen: soft, non-tender; bowel sounds normal; no masses,  no organomegaly Extremities: extremities normal, atraumatic, no cyanosis or edema Pulses: 2+ and symmetric Skin: Skin color, texture, turgor normal. No rashes or lesions Neurologic: Grossly normal   Labs on Admission:   Recent Labs  06/03/13 2151  NA 141  K 3.2*  CL 96  CO2 30  GLUCOSE 168*  BUN 39*  CREATININE 1.40*  CALCIUM 10.1    Recent Labs  06/03/13 2151  AST 12  ALT 7  ALKPHOS 106  BILITOT <0.2*  PROT 7.3  ALBUMIN 2.8*    Recent Labs  06/03/13 2151  WBC 11.3*  HGB 7.9*  HCT 24.4*  MCV 84.1  PLT 355    Radiological Exams on Admission: No results found.  Assessment/Plan 73 yo female with gib and acute blood loss anemia from unclear source   Principal Problem:   Rectal bleeding-  Pain is in rlq but pt reported one vomit bright red blood and melana but none now.  Will transfuse prbc tonight.  hgb prev over 11, now below 8.  No overt bleeding at this time.  Will place on protonix for now.  Will need to call GI in am for further scoping needs.  Ct abd/pel pending.  abd exam is benign at this time.  Active Problems:   HYPERTENSION   HEPATITIS   Bronchiectasis without acute exacerbation   Alzheimer's disease   Anemia associated with acute blood loss   Abdominal pain, acute, right lower quadrant    Jeryn Cerney A 06/04/2013, 1:26 AM  Ct abd/pelvis noted.  No acute findings in  abdomen but with question patchy infiltrate with bronchiectasis at lung base.  Will hold off on abx for now in the absence of fever, cough.  Would have low threshold to start abx esp if spikes fever, develops any worsening resp issues, or worsening leukocytosis.

## 2013-06-04 NOTE — Progress Notes (Signed)
Consent obtained at this time for EGD; pt has been NPO since admission; will cont. To monitor.

## 2013-06-04 NOTE — Progress Notes (Addendum)
INITIAL NUTRITION ASSESSMENT  DOCUMENTATION CODES Per approved criteria  -Not Applicable   INTERVENTION: Advance diet as medically appropriate, add interventions when/as able RD to follow for nutrition care plan  NUTRITION DIAGNOSIS: Inadequate oral intake related to inability to eat as evidenced by NPO status  Goal: Pt to meet >/= 90% of their estimated nutrition needs   Monitor:  PO & supplemental intake, weight, labs, I/O's  Reason for Assessment: Malnutrition Screening Tool Report  73 y.o. female  Admitting Dx: Rectal bleeding  ASSESSMENT: Patient with PMH of dementia and COPD; presented with 2 days of several melanotic stools and abdominal pain; + overall weakness; on rectal exam in ED stool was not melanotic or bloody but was heme positive; admitted for further management.  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  Per admission nutrition screen patient had been eating poorly because of a decreased appetite (unable to obtain diet recall at this time) & recent wt loss; per weight readings, patient has had 20 lb wt loss since beginning of November 2014 (14%); would benefit from addition of oral nutrition supplements when PO diet advanced.  RD unable to complete Nutrition Focused Physical Exam at this time.  RD suspects some level of malnutrition, however, unable to identify at this time.  Height: Ht Readings from Last 1 Encounters:  06/04/13 5' 2.5" (1.588 m)    Weight: Wt Readings from Last 1 Encounters:  06/04/13 121 lb (54.885 kg)    Ideal Body Weight: 110 lb  % Ideal Body Weight: 110%  Wt Readings from Last 10 Encounters:  06/04/13 121 lb (54.885 kg)  02/17/13 141 lb 6.4 oz (64.139 kg)  12/18/12 146 lb (66.225 kg)  12/09/12 147 lb 12.8 oz (67.042 kg)  11/20/12 141 lb 9.6 oz (64.229 kg)  09/16/12 141 lb (63.957 kg)  09/01/12 138 lb 3.2 oz (62.687 kg)  02/25/12 132 lb 3.2 oz  (59.966 kg)  09/03/11 130 lb (58.968 kg)  09/03/11 130 lb (58.968 kg)    Usual Body Weight: 141 lb  % Usual Body Weight: 85%  BMI:  Body mass index is 21.76 kg/(m^2).  Estimated Nutritional Needs: Kcal: 1550-1750 Protein: 70-80 gm Fluid: 1.5-1.7 L  Skin: Intact  Diet Order: NPO  EDUCATION NEEDS: -No education needs identified at this time   Intake/Output Summary (Last 24 hours) at 06/04/13 1008 Last data filed at 06/04/13 0530  Gross per 24 hour  Intake    301 ml  Output      0 ml  Net    301 ml    Labs:   Recent Labs Lab 06/03/13 2151 06/04/13 0850  NA 141 140  K 3.2* 3.1*  CL 96 98  CO2 30 27  BUN 39* 36*  CREATININE 1.40* 1.20*  CALCIUM 10.1 9.8  GLUCOSE 168* 86    CBG (last 3)   Recent Labs  06/04/13 0440  GLUCAP 89    Scheduled Meds: . azithromycin  500 mg Intravenous Q24H  . cefTRIAXone (ROCEPHIN)  IV  1 g Intravenous Q24H  . insulin aspart  0-9 Units Subcutaneous 6 times per day  . pantoprazole (PROTONIX) IV  40 mg Intravenous Q12H  . sodium chloride  3 mL Intravenous Q12H    Continuous Infusions: . 0.9 % NaCl with KCl 20 mEq / L 75 mL/hr at 06/04/13 1610    Past Medical History  Diagnosis Date  . Gout, unspecified   . Other  chronic sinusitis   . Unspecified chronic bronchitis     sees Dr. Melba Coon. Young, last visit 08/2011, treated for E. Coli- resp. /w Ceftin, Feb. 2013  . Hypertension   . GERD (gastroesophageal reflux disease)     rolaids if needed  . COPD (chronic obstructive pulmonary disease)   . Pneumonia     "couple times; long time ago" (12/09/2012)  . Chronic bronchitis     "couple times/yr" (12/09/2012)  . Exertional shortness of breath   . History of blood transfusion     "related to a surgery, I think" (12/09/2012)  . Hepatitis, unspecified     "the one that's not bad" (12/09/2012)  . Migraines     "in the past" (12/09/2012)  . Osteoarthrosis, unspecified whether generalized or localized, unspecified site   . Arthritis      "all over" (12/09/2012)  . Chronic lower back pain   . Altered mental status 12/09/2012  . Diabetes mellitus without complication     Past Surgical History  Procedure Laterality Date  . Tubal ligation    . Tonsillectomy    . Shoulder arthroscopy w/ rotator cuff repair Right   . Carpal tunnel release  09/06/2011    Procedure: CARPAL TUNNEL RELEASE;  Surgeon: Kennieth RadArthur F Carter, MD;  [facility];  Service: Orthopedics;  Laterality: Right;  . Abdominal hysterectomy      "w/right ovary" (12/09/2012)  . Appendectomy    . Breast biopsy Right     Maureen ChattersKatie Rinoa Garramone, RD, LDN Pager #: [phone] After-Hours Pager #: [phone]

## 2013-06-04 NOTE — Consult Note (Signed)
Subjective:   HPI  The patient is a 73 year old female who has been experiencing melena for a few days. It is also reported that she vomited and saw some red color in the emesis. She denies abdominal pain or heartburn. She denies history of peptic ulcer disease. She has been taking Advil. She denies excessive alcohol.  Review of Systems Denies chest pain or shortness of breath at this time.  Past Medical History  Diagnosis Date  . Gout, unspecified   . Other chronic sinusitis   . Unspecified chronic bronchitis     sees Dr. Melba Coon, last visit 08/2011, treated for E. Coli- resp. /w Ceftin, Feb. 2013  . Hypertension   . GERD (gastroesophageal reflux disease)     rolaids if needed  . COPD (chronic obstructive pulmonary disease)   . Pneumonia     "couple times; long time ago" (12/09/2012)  . Chronic bronchitis     "couple times/yr" (12/09/2012)  . Exertional shortness of breath   . History of blood transfusion     "related to a surgery, I think" (12/09/2012)  . Hepatitis, unspecified     "the one that's not bad" (12/09/2012)  . Migraines     "in the past" (12/09/2012)  . Osteoarthrosis, unspecified whether generalized or localized, unspecified site   . Arthritis     "all over" (12/09/2012)  . Chronic lower back pain   . Altered mental status 12/09/2012  . Diabetes mellitus without complication    Past Surgical History  Procedure Laterality Date  . Tubal ligation    . Tonsillectomy    . Shoulder arthroscopy w/ rotator cuff repair Right   . Carpal tunnel release  09/06/2011    Procedure: CARPAL TUNNEL RELEASE;  Surgeon: Kennieth Rad, MD;  Location: [facility] OR;  Service: Orthopedics;  Laterality: Right;  . Abdominal hysterectomy      "w/right ovary" (12/09/2012)  . Appendectomy    . Breast biopsy Right    History   Social History  . Marital Status: Divorced    Spouse Name: N/A    Number of Children: N/A  . Years of Education: N/A   Occupational History  . Not on file.    Social History Main Topics  . Smoking status: Never Smoker   . Smokeless tobacco: Never Used  . Alcohol Use: Yes     Comment: 12/09/2012 "have a taste yearly"  . Drug Use: No  . Sexual Activity: Not Currently   Other Topics Concern  . Not on file   Social History Narrative   Pt exercises approx 4-5 days a week   family history includes ALS in her brother; Heart disease in her father. Current facility-administered medications:0.9 %  sodium chloride infusion, , Intravenous, Continuous, Graylin Shiver, MD;  0.9 % NaCl with KCl 20 mEq/ L  infusion, , Intravenous, Continuous, Esperanza Sheets, MD, Last Rate: 75 mL/hr at 06/04/13 0928;  [facility] HOLD] acetaminophen (TYLENOL) suppository 650 mg, 650 mg, Rectal, Q6H PRN, Haydee Monica, MD;  [facility] HOLD] acetaminophen (TYLENOL) tablet 650 mg, 650 mg, Oral, Q6H PRN, Haydee Monica, MD [facility] HOLD] albuterol (PROVENTIL) (2.5 MG/3ML) 0.083% nebulizer solution 2.5 mg, 2.5 mg, Nebulization, Q2H PRN, Haydee Monica, MD;  [facility] HOLD] azithromycin (ZITHROMAX) 500 mg in dextrose 5 % 250 mL IVPB, 500 mg, Intravenous, Q24H, Esperanza Sheets, MD;  [facility] HOLD] cefTRIAXone (ROCEPHIN) 1 g in dextrose 5 % 50 mL IVPB, 1 g, Intravenous, Q24H, Jessica C Carney, RPH, 1 g  at 06/04/13 1039 [MAR HOLD] insulin aspart (novoLOG) injection 0-9 Units, 0-9 Units, Subcutaneous, 6 times per day, Army ChacoLaura K Easterwood, NP;  [facility] Hansen[MAR HOLD] ondansetron [facility](ZOFRAN) injection 4 mg, 4 mg, Intravenous, Q6H PRN, Haydee Monicaachal A David, MD;  [facility]itzi Hansen[MAR HOLD] ondansetron [facility](ZOFRAN) tablet 4 mg, 4 mg, Oral, Q6H PRN, Haydee Monicaachal A David, MD;  [facility] Hansen[MAR HOLD] pantoprazole (PROTONIX) injection 40 mg, 40 mg, Intravenous, Q12H, Haydee Monicaachal A David, MD, 40 mg at 06/04/13 1043 [[facility] HOLD] sodium chloride 0.9 % injection 3 mL, 3 mL, Intravenous, Q12H, Haydee Monicaachal A David, MD Allergies  Allergen Reactions  . Codeine Rash     angioedema  . Penicillins Itching and Rash       . Tetanus Toxoids Rash  . Neomycin Itching  . Levofloxacin Rash      Objective:     BP 123/58  Pulse 82  Temp(Src) 98.2 F (36.8 C) (Oral)  Resp 15  Ht 5' 2.5" (1.588 m)  Wt 54.885 kg (121 lb)  BMI 21.76 kg/m2  SpO2 100%  She is in no distress  Nonicteric  Heart regular rhythm no murmurs  Lungs clear  Abdomen: Bowel sounds normal, soft, nontender  Laboratory No components found with this basename: d1      Assessment:     #1. Melena  #2. Anemia      Plan:     The symptoms are suggestive of an upper GI bleed. We will proceed with EGD to further evaluate the upper GI tract Lab Results  Component Value Date   HGB 9.1* 06/04/2013   HGB 7.9* 06/03/2013   HGB 11.4* 12/14/2012   HCT 27.0* 06/04/2013   HCT 24.4* 06/03/2013   HCT 33.3* 12/14/2012   ALKPHOS 106 06/03/2013   ALKPHOS 69 12/14/2012   ALKPHOS 62 12/13/2012   AST 12 06/03/2013   AST 15 12/14/2012   AST 11 12/13/2012   ALT 7 06/03/2013   ALT 17 12/14/2012   ALT 16 12/13/2012

## 2013-06-04 NOTE — Progress Notes (Signed)
ANTIBIOTIC CONSULT NOTE - INITIAL  Pharmacy Consult for Ceftriaxone Indication: pneumonia  Allergies  Allergen Reactions  . Codeine Rash     angioedema  . Penicillins Itching and Rash       . Tetanus Toxoids Rash  . Levofloxacin Rash    Patient Measurements: Height: 5' 2.5" (158.8 cm) Weight: 121 lb (54.885 kg) IBW/kg (Calculated) : 51.25 Adjusted Body Weight: n/a  Vital Signs: Temp: 98.7 F (37.1 C) (02/19 0530) Temp src: Oral (02/19 0530) BP: 123/58 mmHg (02/19 0530) Pulse Rate: 82 (02/19 0530) Intake/Output from previous day: 02/18 0701 - 02/19 0700 In: 301 [Blood:301] Out: -  Intake/Output from this shift:    Labs:  Recent Labs  06/03/13 2151  WBC 11.3*  HGB 7.9*  PLT 355  CREATININE 1.40*   Estimated Creatinine Clearance: 29.4 ml/min (by C-G formula based on Cr of 1.4). No results found for this basename: VANCOTROUGH, VANCOPEAK, VANCORANDOM, GENTTROUGH, GENTPEAK, GENTRANDOM, TOBRATROUGH, TOBRAPEAK, TOBRARND, AMIKACINPEAK, AMIKACINTROU, AMIKACIN,  in the last 72 hours   Microbiology: No results found for this or any previous visit (from the past 720 hour(s)).  Medical History: Past Medical History  Diagnosis Date  . Gout, unspecified   . Other chronic sinusitis   . Unspecified chronic bronchitis     sees Dr. Melba Coon. [place], last visit 08/2011, treated for E. Coli- resp. /w Ceftin, Feb. 2013  . Hypertension   . GERD (gastroesophageal reflux disease)     rolaids if needed  . COPD (chronic obstructive pulmonary disease)   . Pneumonia     "couple times; long time ago" (12/09/2012)  . Chronic bronchitis     "couple times/yr" (12/09/2012)  . Exertional shortness of breath   . History of blood transfusion     "related to a surgery, I think" (12/09/2012)  . Hepatitis, unspecified     "the one that's not bad" (12/09/2012)  . Migraines     "in the past" (12/09/2012)  . Osteoarthrosis, unspecified whether generalized or localized, unspecified site   . Arthritis      "all over" (12/09/2012)  . Chronic lower back pain   . Altered mental status 12/09/2012  . Diabetes mellitus without complication     Medications:  Scheduled:  . azithromycin  500 mg Intravenous Q24H  . cefTRIAXone (ROCEPHIN)  IV  1 g Intravenous Q24H  . insulin aspart  0-9 Units Subcutaneous 6 times per day  . pantoprazole (PROTONIX) IV  40 mg Intravenous Q12H  . sodium chloride  3 mL Intravenous Q12H   Assessment: 73 y/o female with PMH of HTN, GERD, COPD, DM, dementia, DJD on NSAIDS presented with several melanotic stools last week, episode of hematemesis and rlq abd pain.  Also with CAP and pharmacy asked to begin empiric ceftriaxone.  Azithromycin ordered by MD as well.  Scr 1.4, estimated CrCl ~ 30 ml/min.  Goal of Therapy:  Resolution of PNA  Plan:  1. Ceftriaxone 1g IV q 24 hrs. 2. Pharmacy will sign-off, please contact if further questions.  Thank you!  Tad MooreJessica Aleister Lady, Pharm D, BCPS  Clinical Pharmacist Pager [phone]  06/04/2013 9:16 AM

## 2013-06-04 NOTE — Progress Notes (Signed)
Utilization review completed. Salvador Coupe, RN, BSN. 

## 2013-06-04 NOTE — Progress Notes (Addendum)
Patient arrived to the floor with blood transfusing.  The ED RN had to turn down the rate of blood to 95 ml/hr as patient could not tolerate at a higher rate.  Vitals stable.  Will continue to monitor.

## 2013-06-04 NOTE — ED Notes (Signed)
Pt states that her IV is still burning.

## 2013-06-04 NOTE — Care Management Note (Unsigned)
    Page 1 of 1   06/04/2013     4:35:20 PM   CARE MANAGEMENT NOTE 06/04/2013  Patient:  Sheila AmassSMITH,Darica L   Account Number:  [id number]  Date Initiated:  06/04/2013  Documentation initiated by:  Aniruddh Ciavarella  Subjective/Objective Assessment:   PT ADM ON 2/19 WITH ANEMIA, UPPER AND LOWER GI BLEEDS. PTA, PT RESIDES AT HOME WITH SPOUSE.     Action/Plan:   WILL FOLLOW FOR DC NEEDS AS PT PROGRESSES.   Anticipated DC Date:  06/07/2013   Anticipated DC Plan:  HOME W HOME HEALTH SERVICES      DC Planning Services  CM consult      Choice offered to / List presented to:             Status of service:  In process, will continue to follow Medicare Important Message given?   (If response is "NO", the following Medicare IM given date fields will be blank) Date Medicare IM given:   Date Additional Medicare IM given:    Discharge Disposition:    Per UR Regulation:  Reviewed for med. necessity/level of care/duration of stay  If discussed at Long Length of Stay Meetings, dates discussed:    Comments:

## 2013-06-04 NOTE — Op Note (Signed)
[facility] [address] [place], [id number]   ENDOSCOPY PROCEDURE REPORT  PATIENT: Sheila AmassSmith, Sheila L.  MR#: [id number] BIRTHDATE: [birth year] , 73  yrs. old GENDER: Female ENDOSCOPIST: Wandalee FerdinandSam Nechuma Boven, MD REFERRED BY: PROCEDURE DATE:  06/04/2013 PROCEDURE:   EGD ASA CLASS: 3 INDICATIONS: melena MEDICATIONS: fentanyl 37.5 mcg IV, Versed 4 mg IV TOPICAL ANESTHETIC:  DESCRIPTION OF PROCEDURE:   After the risks benefits and alternatives of the procedure were thoroughly explained, informed consent was obtained.  The Pentax Gastroscope [id number]  endoscope was introduced through the mouth and advanced to the second portion of the duodenum      , limited by Without limitations.   The instrument was slowly withdrawn as the mucosa was fully examined.      FINDINGS:  Esophagus: Normal  Stomach/duodenum:Small to moderate hiatal hernia. Normal mucosa in the upper portion and body of the stomach. The pyloric channel was visualized and is seen on image 004  and image 009 above as the opening in the lower part of the picture. Above this is another lumen as seen in the photograph and this lumen appears to be a fistula which is communicating between the antrum of the stomach and the duodenal bulb. In the distal portion of this lumen there is a duodenal bulb ulcer. Image 004 and image 009 is viewing this ulcer from the stomach were as image 010 is imaging this ulcer from the scope being in the duodenal bulb. there is a flat reddish area on this ulcer. It was washed, but is not bleeding.    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   RECOMMENDATIONS:PPI therapy and observe clinical course. check H. pyloric antibody. Avoid NSAIDs.      _______________________________ Rosalie DoctoreSignedWandalee Ferdinand:  Sam Carrin Vannostrand, MD 06/04/2013 12:20 PM

## 2013-06-04 NOTE — Progress Notes (Addendum)
[facility] PROGRESS NOTE  Sheila Cisneros JXB:[id number] DOB: 10/03/1940 DOA: 06/03/2013 PCP: Alva GarnetSHELTON,KIMBERLY R., MD  Assessment/Plan: 73 y/o female with PMH of HTN, GERD, COPD, DM, dementia, DJD on NSAIDS presented with several melanotic stools last week, episode of hematemesis and rlq abd pain  1. GIB r/o nsaid induced vs diverticular  -TF sed 1 unit 2/18; pend CBC; keep NPO, IVF, PPI, TF PRBC as needed; consulted GI  2. Acute blood loss anemia; TF sed 1 unit; TF as needed; pend GI eval;   3. AKI likely prerenal + diuretics+NSAIDS;  -IVF, recheck BMP; hold diuretics   4. UTI started on IV atx;   5. CAP; CT: L lung infiltrate; COPD: no wheezing  -started IV atx, cont bronchodilators, oxygen as needed   6. DM not on meds at home; cont ISS   Code Status: full Family Communication: d/w patient, called updated Rickerson,Herbert Spouse [phone]   (indicate person spoken with, relationship, and if by phone, the number) Disposition Plan: pend clincial improvement    Consultants:  GI  Procedures:  none  Antibiotics:  ceftriaxone 2/19 (indicate start date, and stop date if known)  HPI/Subjective: alert  Objective: Filed Vitals:   06/04/13 0530  BP: 123/58  Pulse: 82  Temp: 98.7 F (37.1 C)  Resp: 15    Intake/Output Summary (Last 24 hours) at 06/04/13 0821 Last data filed at 06/04/13 0530  Gross per 24 hour  Intake    301 ml  Output      0 ml  Net    301 ml   Filed Weights   06/04/13 0530  Weight: 54.885 kg (121 lb)    Exam:   General:  alert  Cardiovascular: s1,s2 rrr  Respiratory: CTA BL  Abdomen: soft, nt, nd   Musculoskeletal: no edema    Data Reviewed: Basic Metabolic Panel:  Recent Labs Lab 06/03/13 2151  NA 141  K 3.2*  CL 96  CO2 30  GLUCOSE 168*  BUN 39*  CREATININE 1.40*  CALCIUM 10.1   Liver Function Tests:  Recent Labs Lab 06/03/13 2151  AST 12  ALT 7  ALKPHOS 106  BILITOT <0.2*  PROT 7.3   ALBUMIN 2.8*   No results found for this basename: LIPASE, AMYLASE,  in the last 168 hours No results found for this basename: AMMONIA,  in the last 168 hours CBC:  Recent Labs Lab 06/03/13 2151  WBC 11.3*  HGB 7.9*  HCT 24.4*  MCV 84.1  PLT 355   Cardiac Enzymes: No results found for this basename: CKTOTAL, CKMB, CKMBINDEX, TROPONINI,  in the last 168 hours BNP (last 3 results)  Recent Labs  11/09/12 1638  PROBNP 47.3   CBG:  Recent Labs Lab 06/04/13 0440  GLUCAP 89    No results found for this or any previous visit (from the past 240 hour(s)).   Studies: Ct Abdomen Pelvis W Contrast  06/04/2013   CLINICAL DATA:  Pain.  EXAM: CT ABDOMEN AND PELVIS WITH CONTRAST  TECHNIQUE: Multidetector CT imaging of the abdomen and pelvis was performed using the standard protocol following bolus administration of intravenous contrast.  CONTRAST:  80mL OMNIPAQUE IOHEXOL 300 MG/ML  SOLN  COMPARISON:  CT ABD/PELVIS W CM dated 10/08/2011  FINDINGS: Included view of the lung bases demonstrates patchy consolidation in the left lung base with bronchiectasis and scarring. The heart and pericardium are unremarkable.  Moderate hiatal hernia. Remainder of the stomach is unremarkable. Small and large bowel are normal in course  and caliber without inflammatory changes. No gastric wall thickening on today's examination. Contrast as yet to reach the large bowel.  14 mm cyst in the segment 7 of the liver, unchanged. The liver is otherwise unremarkable. The gallbladder, spleen, adrenal glands are nonsuspicious. Atrophic pancreas is unchanged.  No intraperitoneal free fluid nor free air. 2 mm cortical based right renal interpolar calcification. Additional too small to characterize hypodensities in the right kidney, the kidneys are otherwise unremarkable. Delayed imaging demonstrates prompt symmetric excretion into the proximal urinary collecting systems. Aortoiliac vessels are normal in course and caliber with  mild calcific atherosclerosis. Status post hysterectomy. Urinary bladder is partially distended unremarkable.  Soft tissues are unremarkable. Grade 2 L4-5 anterolisthesis without pars interarticularis defects. Moderate L4-5 canal stenosis and moderate to severe neural foraminal narrowing at this level. Moderate to severe L3-4 neural foraminal narrowing. Small fat containing umbilical hernia.  IMPRESSION: Patchy consolidation in the left lung base concerning for pneumonia, in a background of bronchiectasis and scarring.  No acute intra-abdominal or pelvic process. No gastric wall thickening on today's examination.  Moderate hiatal hernia.   Electronically Signed   By: Awilda Metro   On: 06/04/2013 01:45    Scheduled Meds: . diphenhydrAMINE  12.5 mg Intravenous Once  . insulin aspart  0-9 Units Subcutaneous 6 times per day  . pantoprazole (PROTONIX) IV  40 mg Intravenous Q12H  . sodium chloride  3 mL Intravenous Q12H  . sodium chloride  3 mL Intravenous Q12H   Continuous Infusions:   Principal Problem:   Rectal bleeding Active Problems:   HYPERTENSION   HEPATITIS   Bronchiectasis without acute exacerbation   Anemia associated with acute blood loss   Abdominal pain, acute, right lower quadrant   GIB (gastrointestinal bleeding)    Time spent: >35 minutes     Esperanza Sheets  [facility] Pager [phone]. If 7PM-7AM, please contact night-coverage at [email or web address], password [facility] 06/04/2013, 8:21 AM  LOS: 1 day

## 2013-06-05 ENCOUNTER — Encounter (HOSPITAL_COMMUNITY): Payer: Self-pay | Admitting: Gastroenterology

## 2013-06-05 DIAGNOSIS — J189 Pneumonia, unspecified organism: Secondary | ICD-10-CM

## 2013-06-05 DIAGNOSIS — N390 Urinary tract infection, site not specified: Secondary | ICD-10-CM

## 2013-06-05 DIAGNOSIS — D649 Anemia, unspecified: Secondary | ICD-10-CM

## 2013-06-05 LAB — CBC
HCT: 25.2 % — ABNORMAL LOW (ref 36.0–46.0)
Hemoglobin: 8.3 g/dL — ABNORMAL LOW (ref 12.0–15.0)
MCH: 27.9 pg (ref 26.0–34.0)
MCHC: 32.9 g/dL (ref 30.0–36.0)
MCV: 84.6 fL (ref 78.0–100.0)
Platelets: 292 10*3/uL (ref 150–400)
RBC: 2.98 MIL/uL — AB (ref 3.87–5.11)
RDW: 16.3 % — AB (ref 11.5–15.5)
WBC: 10.2 10*3/uL (ref 4.0–10.5)

## 2013-06-05 LAB — BASIC METABOLIC PANEL
BUN: 23 mg/dL (ref 6–23)
CALCIUM: 8.8 mg/dL (ref 8.4–10.5)
CO2: 25 meq/L (ref 19–32)
CREATININE: 1.07 mg/dL (ref 0.50–1.10)
Chloride: 106 mEq/L (ref 96–112)
GFR calc Af Amer: 59 mL/min — ABNORMAL LOW (ref >90)
GFR calc non Af Amer: 51 mL/min — ABNORMAL LOW (ref >90)
Glucose, Bld: 86 mg/dL (ref 70–99)
Potassium: 3.3 mEq/L — ABNORMAL LOW (ref 3.7–5.3)
SODIUM: 144 meq/L (ref 137–147)

## 2013-06-05 LAB — GLUCOSE, CAPILLARY
GLUCOSE-CAPILLARY: 81 mg/dL (ref 70–99)
Glucose-Capillary: 88 mg/dL (ref 70–99)
Glucose-Capillary: 99 mg/dL (ref 70–99)
Glucose-Capillary: 130 mg/dL — ABNORMAL HIGH (ref 70–99)

## 2013-06-05 LAB — HEMOGLOBIN AND HEMATOCRIT, BLOOD
HEMATOCRIT: 23.7 % — AB (ref 36.0–46.0)
HEMATOCRIT: 26.5 % — AB (ref 36.0–46.0)
HEMOGLOBIN: 7.8 g/dL — AB (ref 12.0–15.0)
Hemoglobin: 8.9 g/dL — ABNORMAL LOW (ref 12.0–15.0)

## 2013-06-05 MED ORDER — ALBUTEROL SULFATE (2.5 MG/3ML) 0.083% IN NEBU
2.5000 mg | INHALATION_SOLUTION | Freq: Three times a day (TID) | RESPIRATORY_TRACT | Status: DC
  Administered 2013-06-06 – 2013-06-07 (×4): 2.5 mg via RESPIRATORY_TRACT
  Filled 2013-06-05 (×4): qty 3

## 2013-06-05 MED ORDER — LORAZEPAM 0.5 MG PO TABS
0.5000 mg | ORAL_TABLET | Freq: Once | ORAL | Status: AC
  Administered 2013-06-05: 0.5 mg via ORAL
  Filled 2013-06-05: qty 1

## 2013-06-05 MED ORDER — TRAMADOL HCL 50 MG PO TABS
50.0000 mg | ORAL_TABLET | Freq: Four times a day (QID) | ORAL | Status: DC | PRN
  Administered 2013-06-05 – 2013-06-07 (×3): 50 mg via ORAL
  Filled 2013-06-05 (×3): qty 1

## 2013-06-05 MED ORDER — INSULIN ASPART 100 UNIT/ML ~~LOC~~ SOLN
0.0000 [IU] | Freq: Three times a day (TID) | SUBCUTANEOUS | Status: DC
  Administered 2013-06-06 – 2013-06-07 (×3): 1 [IU] via SUBCUTANEOUS

## 2013-06-05 NOTE — Progress Notes (Signed)
RT paged at this time; pt requesting Albuterol treatment at this time; will cont. To monitor.

## 2013-06-05 NOTE — Progress Notes (Signed)
[facility] PROGRESS NOTE  EXIE CHRISMER ZOX:[id number] DOB: [birth year] DOA: 06/03/2013 PCP: Alva Garnet., MD  Assessment/Plan: 73 y/o female with PMH of HTN, GERD, COPD, DM, dementia, DJD on NSAIDS presented with several melanotic stools last week, episode of hematemesis and rlq abd pain  1. 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 Acute blood loss anemia; TF sed 1 unit; TF as needed;   3. AKI likely prerenal + diuretics+NSAIDS;  -improved on IVF, recheck BMP; hold diuretics   4. UTI started on IV atx;   5. CAP; CT: L lung infiltrate; COPD: no wheezing  -started IV atx, cont bronchodilators, oxygen as needed   6. DM not on meds at home; HA1-7.1; cont ISS   Code Status: full Family Communication: d/w patient, called updated Malcomb,Herbert Spouse [phone]   (indicate person spoken with, relationship, and if by phone, the number) Disposition Plan: pend clincial improvement    Consultants:  GI  Procedures:  none  Antibiotics:  ceftriaxone 2/19 (indicate start date, and stop date if known)  HPI/Subjective: alert  Objective: Filed Vitals:   06/05/13 0345  BP: 135/64  Pulse: 88  Temp: 98.3 F (36.8 C)  Resp: 18    Intake/Output Summary (Last 24 hours) at 06/05/13 1002 Last data filed at 06/05/13 0756  Gross per 24 hour  Intake   1225 ml  Output   1500 ml  Net   -275 ml   Filed Weights   06/04/13 0530  Weight: 54.885 kg (121 lb)    Exam:   General:  alert  Cardiovascular: s1,s2 rrr  Respiratory: CTA BL  Abdomen: soft, nt, nd   Musculoskeletal: no edema    Data Reviewed: Basic Metabolic Panel:  Recent Labs Lab 06/03/13 2151 06/04/13 0850 06/05/13 0318  NA 141 140 144  K 3.2* 3.1* 3.3*  CL 96 98 106  CO2 30 27 25   GLUCOSE 168* 86 86  BUN 39* 36* 23  CREATININE 1.40* 1.20* 1.07  CALCIUM 10.1 9.8 8.8    Liver Function Tests:  Recent Labs Lab 06/03/13 2151  AST 12  ALT 7  ALKPHOS 106  BILITOT <0.2*  PROT 7.3  ALBUMIN 2.8*   No results found for this basename: LIPASE, AMYLASE,  in the last 168 hours No results found for this basename: AMMONIA,  in the last 168 hours CBC:  Recent Labs Lab 06/03/13 2151 06/04/13 0850 06/04/13 1620 06/05/13 0110 06/05/13 0318 06/05/13 0754  WBC 11.3* 9.4  --   --  10.2  --   HGB 7.9* 9.1* 8.6* 7.8* 8.3* 8.9*  HCT 24.4* 27.0* 25.6* 23.7* 25.2* 26.5*  MCV 84.1 84.4  --   --  84.6  --   PLT 355 293  --   --  292  --    Cardiac Enzymes: No results found for this basename: CKTOTAL, CKMB, CKMBINDEX, TROPONINI,  in the last 168 hours BNP (last 3 results)  Recent Labs  11/09/12 1638  PROBNP 47.3   CBG:  Recent Labs Lab 06/04/13 1614 06/04/13 1944 06/05/13 0011 06/05/13 0355 06/05/13 0759  GLUCAP 88 69* 81 88 99    No results found for this or any previous visit (from the past 240 hour(s)).   Studies: Ct Abdomen Pelvis W Contrast  06/04/2013   CLINICAL DATA:  Pain.  EXAM: CT ABDOMEN AND PELVIS WITH CONTRAST  TECHNIQUE:  Multidetector CT imaging of the abdomen and pelvis was performed using the standard protocol following bolus administration of intravenous contrast.  CONTRAST:  80mL OMNIPAQUE IOHEXOL 300 MG/ML  SOLN  COMPARISON:  CT ABD/PELVIS W CM dated 10/08/2011  FINDINGS: Included view of the lung bases demonstrates patchy consolidation in the left lung base with bronchiectasis and scarring. The heart and pericardium are unremarkable.  Moderate hiatal hernia. Remainder of the stomach is unremarkable. Small and large bowel are normal in course and caliber without inflammatory changes. No gastric wall thickening on today's examination. Contrast as yet to reach the large bowel.  14 mm cyst in the segment 7 of the liver, unchanged. The liver is otherwise unremarkable. The gallbladder, spleen, adrenal glands are nonsuspicious. Atrophic  pancreas is unchanged.  No intraperitoneal free fluid nor free air. 2 mm cortical based right renal interpolar calcification. Additional too small to characterize hypodensities in the right kidney, the kidneys are otherwise unremarkable. Delayed imaging demonstrates prompt symmetric excretion into the proximal urinary collecting systems. Aortoiliac vessels are normal in course and caliber with mild calcific atherosclerosis. Status post hysterectomy. Urinary bladder is partially distended unremarkable.  Soft tissues are unremarkable. Grade 2 L4-5 anterolisthesis without pars interarticularis defects. Moderate L4-5 canal stenosis and moderate to severe neural foraminal narrowing at this level. Moderate to severe L3-4 neural foraminal narrowing. Small fat containing umbilical hernia.  IMPRESSION: Patchy consolidation in the left lung base concerning for pneumonia, in a background of bronchiectasis and scarring.  No acute intra-abdominal or pelvic process. No gastric wall thickening on today's examination.  Moderate hiatal hernia.   Electronically Signed   By: Awilda Metroourtnay  Bloomer   On: 06/04/2013 01:45    Scheduled Meds: . azithromycin  500 mg Intravenous Q24H  . cefTRIAXone (ROCEPHIN)  IV  1 g Intravenous Q24H  . insulin aspart  0-9 Units Subcutaneous 6 times per day  . pantoprazole (PROTONIX) IV  40 mg Intravenous Q12H  . sodium chloride  3 mL Intravenous Q12H   Continuous Infusions: . 0.9 % NaCl with KCl 20 mEq / L 75 mL/hr at 06/05/13 [id number]    Principal Problem:   Rectal bleeding Active Problems:   HYPERTENSION   HEPATITIS   Bronchiectasis without acute exacerbation   Anemia associated with acute blood loss   Abdominal pain, acute, right lower quadrant   GIB (gastrointestinal bleeding)    Time spent: >35 minutes     Esperanza SheetsBURIEV, Dhyana Bastone N  [facility] Pager [phone]. If 7PM-7AM, please contact night-coverage at [email or web address], password [email or web address] 06/05/2013, 10:02 AM  LOS: 2 days

## 2013-06-05 NOTE — Progress Notes (Signed)
[facility] Progress Note  Subjective: No complaints today. Stool still dark  Objective: Vital signs in last 24 hours: Temp:  [98.3 F (36.8 C)-99.3 F (37.4 C)] 98.3 F (36.8 C) (02/20 0345) Pulse Rate:  [75-98] 88 (02/20 0345) Resp:  [10-27] 18 (02/20 0345) BP: (120-156)/(50-101) 135/64 mmHg (02/20 0345) SpO2:  [94 %-100 %] 100 % (02/20 0345) Weight change:    PE:  She is in no distress  Abdomen soft nontender  Lab Results: Results for orders placed during the hospital encounter of 06/03/13 (from the past 24 hour(s))  GLUCOSE, CAPILLARY     Status: None   Collection Time    06/04/13 12:55 PM      Result Value Ref Range   Glucose-Capillary 75  70 - 99 mg/dL  GLUCOSE, CAPILLARY     Status: None   Collection Time    06/04/13  4:14 PM      Result Value Ref Range   Glucose-Capillary 88  70 - 99 mg/dL  HEMOGLOBIN AND HEMATOCRIT, BLOOD     Status: Abnormal   Collection Time    06/04/13  4:20 PM      Result Value Ref Range   Hemoglobin 8.6 (*) 12.0 - 15.0 g/dL   HCT 40.9 (*) 81.1 - 91.4 %  GLUCOSE, CAPILLARY     Status: Abnormal   Collection Time    06/04/13  7:44 PM      Result Value Ref Range   Glucose-Capillary 69 (*) 70 - 99 mg/dL   Comment 1 Documented in Chart     Comment 2 Notify RN    GLUCOSE, CAPILLARY     Status: None   Collection Time    06/05/13 12:11 AM      Result Value Ref Range   Glucose-Capillary 81  70 - 99 mg/dL  HEMOGLOBIN AND HEMATOCRIT, BLOOD     Status: Abnormal   Collection Time    06/05/13  1:10 AM      Result Value Ref Range   Hemoglobin 7.8 (*) 12.0 - 15.0 g/dL   HCT 78.2 (*) 95.6 - 21.3 %  CBC     Status: Abnormal   Collection Time    06/05/13  3:18 AM      Result Value Ref Range   WBC 10.2  4.0 - 10.5 K/uL   RBC 2.98 (*) 3.87 - 5.11 MIL/uL   Hemoglobin 8.3 (*) 12.0 - 15.0 g/dL   HCT 08.6 (*) 57.8 - 46.9 %   MCV 84.6  78.0 - 100.0 fL   MCH 27.9  26.0 - 34.0 pg   MCHC 32.9  30.0 - 36.0 g/dL   RDW 62.9 (*) 52.8 -  15.5 %   Platelets 292  150 - 400 K/uL  BASIC METABOLIC PANEL     Status: Abnormal   Collection Time    06/05/13  3:18 AM      Result Value Ref Range   Sodium 144  137 - 147 mEq/L   Potassium 3.3 (*) 3.7 - 5.3 mEq/L   Chloride 106  96 - 112 mEq/L   CO2 25  19 - 32 mEq/L   Glucose, Bld 86  70 - 99 mg/dL   BUN 23  6 - 23 mg/dL   Creatinine, Ser 4.13  0.50 - 1.10 mg/dL   Calcium 8.8  8.4 - 24.4 mg/dL   GFR calc non Af Amer 51 (*) >90 mL/min   GFR calc Af Amer 59 (*) >90 mL/min  GLUCOSE, CAPILLARY  Status: None   Collection Time    06/05/13  3:55 AM      Result Value Ref Range   Glucose-Capillary 88  70 - 99 mg/dL  HEMOGLOBIN AND HEMATOCRIT, BLOOD     Status: Abnormal   Collection Time    06/05/13  7:54 AM      Result Value Ref Range   Hemoglobin 8.9 (*) 12.0 - 15.0 g/dL   HCT 16.126.5 (*) 09.636.0 - 04.546.0 %  GLUCOSE, CAPILLARY     Status: None   Collection Time    06/05/13  7:59 AM      Result Value Ref Range   Glucose-Capillary 99  70 - 99 mg/dL   Comment 1 Notify RN      Studies/Results: @RISRSLT24 @    Assessment: GI bleed secondary to duodenal ulcer  Plan: Continue medical management. Follow clinically.    Graylin ShiverGANEM,Brigg Cape F 06/05/2013, 11:02 AM  Lab Results  Component Value Date   HGB 8.9* 06/05/2013   HGB 8.3* 06/05/2013   HGB 7.8* 06/05/2013   HCT 26.5* 06/05/2013   HCT 25.2* 06/05/2013   HCT 23.7* 06/05/2013   ALKPHOS 106 06/03/2013   ALKPHOS 69 12/14/2012   ALKPHOS 62 12/13/2012   AST 12 06/03/2013   AST 15 12/14/2012   AST 11 12/13/2012   ALT 7 06/03/2013   ALT 17 12/14/2012   ALT 16 12/13/2012

## 2013-06-06 DIAGNOSIS — R109 Unspecified abdominal pain: Secondary | ICD-10-CM

## 2013-06-06 DIAGNOSIS — I10 Essential (primary) hypertension: Secondary | ICD-10-CM

## 2013-06-06 LAB — GLUCOSE, CAPILLARY
GLUCOSE-CAPILLARY: 104 mg/dL — AB (ref 70–99)
GLUCOSE-CAPILLARY: 134 mg/dL — AB (ref 70–99)
GLUCOSE-CAPILLARY: 125 mg/dL — AB (ref 70–99)
Glucose-Capillary: 145 mg/dL — ABNORMAL HIGH (ref 70–99)

## 2013-06-06 LAB — H.PYLORI ANTIGEN, STOOL

## 2013-06-06 LAB — CBC
HCT: 23.7 % — ABNORMAL LOW (ref 36.0–46.0)
HEMOGLOBIN: 7.8 g/dL — AB (ref 12.0–15.0)
MCH: 28.1 pg (ref 26.0–34.0)
MCHC: 32.9 g/dL (ref 30.0–36.0)
MCV: 85.3 fL (ref 78.0–100.0)
Platelets: 289 10*3/uL (ref 150–400)
RBC: 2.78 MIL/uL — ABNORMAL LOW (ref 3.87–5.11)
RDW: 16.4 % — ABNORMAL HIGH (ref 11.5–15.5)
WBC: 8.4 10*3/uL (ref 4.0–10.5)

## 2013-06-06 LAB — PREPARE RBC (CROSSMATCH)

## 2013-06-06 NOTE — Progress Notes (Signed)
[facility] Progress Note  Subjective: No specific complaints. Stools were still dark.  Objective: Vital signs in last 24 hours: Temp:  [97.5 F (36.4 C)-98.7 F (37.1 C)] 98.5 F (36.9 C) (02/21 0414) Pulse Rate:  [85-98] 89 (02/21 0414) Resp:  [17-18] 17 (02/21 0414) BP: (136-150)/(59-76) 150/76 mmHg (02/21 0414) SpO2:  [96 %-100 %] 96 % (02/21 0943) Weight change:    PE:  She is in no distress  Abdomen soft nontender  Hemoglobin has dropped a little  Lab Results: Results for orders placed during the hospital encounter of 06/03/13 (from the past 24 hour(s))  GLUCOSE, CAPILLARY     Status: Abnormal   Collection Time    06/05/13  8:04 PM      Result Value Ref Range   Glucose-Capillary 130 (*) 70 - 99 mg/dL  CBC     Status: Abnormal   Collection Time    06/06/13  3:26 AM      Result Value Ref Range   WBC 8.4  4.0 - 10.5 K/uL   RBC 2.78 (*) 3.87 - 5.11 MIL/uL   Hemoglobin 7.8 (*) 12.0 - 15.0 g/dL   HCT 82.923.7 (*) 56.236.0 - 13.046.0 %   MCV 85.3  78.0 - 100.0 fL   MCH 28.1  26.0 - 34.0 pg   MCHC 32.9  30.0 - 36.0 g/dL   RDW 86.516.4 (*) 78.411.5 - 69.615.5 %   Platelets 289  150 - 400 K/uL  GLUCOSE, CAPILLARY     Status: Abnormal   Collection Time    06/06/13  6:37 AM      Result Value Ref Range   Glucose-Capillary 104 (*) 70 - 99 mg/dL   Comment 1 Documented in Chart     Comment 2 Notify RN    PREPARE RBC (CROSSMATCH)     Status: None   Collection Time    06/06/13  8:15 AM      Result Value Ref Range   Order Confirmation ORDER PROCESSED BY BLOOD BANK      Studies/Results: @RISRSLT24 @    Assessment: GI bleed  Duodenal ulcer, H. pylori antigen negative. I think this is NSAID-induced  Plan: Continue PPI therapy. Watch for further signs of significant bleeding. Transfuse blood as needed.    Graylin ShiverGANEM,Josclyn Rosales F 06/06/2013, 10:37 AM  Lab Results  Component Value Date   HGB 7.8* 06/06/2013   HGB 8.9* 06/05/2013   HGB 8.3* 06/05/2013   HCT 23.7* 06/06/2013   HCT  26.5* 06/05/2013   HCT 25.2* 06/05/2013   ALKPHOS 106 06/03/2013   ALKPHOS 69 12/14/2012   ALKPHOS 62 12/13/2012   AST 12 06/03/2013   AST 15 12/14/2012   AST 11 12/13/2012   ALT 7 06/03/2013   ALT 17 12/14/2012   ALT 16 12/13/2012

## 2013-06-06 NOTE — Progress Notes (Signed)
[facility] PROGRESS NOTE  VELVIE THOMASTON ZOX:[id number] DOB: 08-27-1940 DOA: 06/03/2013 PCP: Alva Garnet., MD  Assessment/Plan: 73 y/o female with PMH of HTN, GERD, COPD, DM, dementia, DJD on NSAIDS presented with several melanotic stools last week, episode of hematemesis and rlq abd pain  1. 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 Acute blood loss anemia; TF as needed;   3. AKI likely prerenal + diuretics+NSAIDS;  -improved on IVF, recheck BMP; hold diuretics   4. UTI started on IV atx; improved  5. CAP; CT: L lung infiltrate; COPD: no wheezing  improving on IV atx, cont bronchodilators, oxygen as needed   6. DM not on meds at home; HA1-7.1; cont ISS   Code Status: full Family Communication: d/w patient, called updated [place],Herbert Spouse [phone]   (indicate person spoken with, relationship, and if by phone, the number) Disposition Plan: pend clincial improvement    Consultants:  GI  Procedures:  none  Antibiotics:  ceftriaxone 2/19 (indicate start date, and stop date if known)  HPI/Subjective: alert  Objective: Filed Vitals:   06/06/13 0414  BP: 150/76  Pulse: 89  Temp: 98.5 F (36.9 C)  Resp: 17    Intake/Output Summary (Last 24 hours) at 06/06/13 0812 Last data filed at 06/05/13 1857  Gross per 24 hour  Intake 1864.16 ml  Output      0 ml  Net 1864.16 ml   Filed Weights   06/04/13 0530  Weight: 54.885 kg (121 lb)    Exam:   General:  alert  Cardiovascular: s1,s2 rrr  Respiratory: CTA BL  Abdomen: soft, nt, nd   Musculoskeletal: no edema    Data Reviewed: Basic Metabolic Panel:  Recent Labs Lab 06/03/13 2151 06/04/13 0850 06/05/13 0318  NA 141 140 144  K 3.2* 3.1* 3.3*  CL 96 98 106  CO2 30 27 25   GLUCOSE 168* 86 86  BUN 39* 36* 23  CREATININE  1.40* 1.20* 1.07  CALCIUM 10.1 9.8 8.8   Liver Function Tests:  Recent Labs Lab 06/03/13 2151  AST 12  ALT 7  ALKPHOS 106  BILITOT <0.2*  PROT 7.3  ALBUMIN 2.8*   No results found for this basename: LIPASE, AMYLASE,  in the last 168 hours No results found for this basename: AMMONIA,  in the last 168 hours CBC:  Recent Labs Lab 06/03/13 2151 06/04/13 0850 06/04/13 1620 06/05/13 0110 06/05/13 0318 06/05/13 0754 06/06/13 0326  WBC 11.3* 9.4  --   --  10.2  --  8.4  HGB 7.9* 9.1* 8.6* 7.8* 8.3* 8.9* 7.8*  HCT 24.4* 27.0* 25.6* 23.7* 25.2* 26.5* 23.7*  MCV 84.1 84.4  --   --  84.6  --  85.3  PLT 355 293  --   --  292  --  289   Cardiac Enzymes: No results found for this basename: CKTOTAL, CKMB, CKMBINDEX, TROPONINI,  in the last 168 hours BNP (last 3 results)  Recent Labs  11/09/12 1638  PROBNP 47.3   CBG:  Recent Labs Lab 06/05/13 0011 06/05/13 0355 06/05/13 0759 06/05/13 2004 06/06/13 0637  GLUCAP 81 88 99 130* 104*    Recent Results (from the past 240 hour(s))  H.PYLORI ANTIGEN, STOOL     Status: None   Collection Time    06/05/13  7:30 AM  Result Value Ref Range Status   Specimen Description STOOL   Final   Special Requests NONE   Final   H. pylori ag, stool     Final   Value: NEGATIVE Antimicrobials,proton pump inhibitors and bismuth preparations are known to suppress H.pylori and ingestion of these prior to testing may cause a false negative result. If a negative result is obtained for a patient that has  ingested these      compounds within two weeks prior of performing the H.pylori test, results may be falsely negative and should be repeated with a new specimen two weeks after discontinuing treatment.     Performed at [facility]   Report Status 06/06/2013 FINAL   Final     Studies: No results found.  Scheduled Meds: . albuterol  2.5 mg Nebulization TID  . azithromycin  500 mg Intravenous Q24H  . cefTRIAXone (ROCEPHIN)  IV  1  g Intravenous Q24H  . insulin aspart  0-9 Units Subcutaneous TID AC & HS  . pantoprazole (PROTONIX) IV  40 mg Intravenous Q12H  . sodium chloride  3 mL Intravenous Q12H   Continuous Infusions: . 0.9 % NaCl with KCl 20 mEq / L 50 mL/hr at 06/05/13 1813    Principal Problem:   Rectal bleeding Active Problems:   HYPERTENSION   HEPATITIS   Bronchiectasis without acute exacerbation   Anemia associated with acute blood loss   Abdominal pain, acute, right lower quadrant   GIB (gastrointestinal bleeding)    Time spent: >35 minutes     Esperanza SheetsBURIEV, Sidney Kann N  [facility] Pager [phone]. If 7PM-7AM, please contact night-coverage at [email or web address], password [email or web address] 06/06/2013, 8:12 AM  LOS: 3 days

## 2013-06-07 DIAGNOSIS — E860 Dehydration: Secondary | ICD-10-CM

## 2013-06-07 DIAGNOSIS — M549 Dorsalgia, unspecified: Secondary | ICD-10-CM

## 2013-06-07 DIAGNOSIS — D72829 Elevated white blood cell count, unspecified: Secondary | ICD-10-CM

## 2013-06-07 LAB — TYPE AND SCREEN
ABO/RH(D): O POS
ANTIBODY SCREEN: NEGATIVE
UNIT DIVISION: 0
Unit division: 0
Unit division: 0

## 2013-06-07 LAB — CBC
HCT: 32 % — ABNORMAL LOW (ref 36.0–46.0)
Hemoglobin: 11 g/dL — ABNORMAL LOW (ref 12.0–15.0)
MCH: 28.8 pg (ref 26.0–34.0)
MCHC: 34.4 g/dL (ref 30.0–36.0)
MCV: 83.8 fL (ref 78.0–100.0)
PLATELETS: 255 10*3/uL (ref 150–400)
RBC: 3.82 MIL/uL — ABNORMAL LOW (ref 3.87–5.11)
RDW: 15.6 % — ABNORMAL HIGH (ref 11.5–15.5)
WBC: 9.2 10*3/uL (ref 4.0–10.5)

## 2013-06-07 LAB — GLUCOSE, CAPILLARY
GLUCOSE-CAPILLARY: 113 mg/dL — AB (ref 70–99)
GLUCOSE-CAPILLARY: 124 mg/dL — AB (ref 70–99)
GLUCOSE-CAPILLARY: 90 mg/dL (ref 70–99)
Glucose-Capillary: 104 mg/dL — ABNORMAL HIGH (ref 70–99)

## 2013-06-07 MED ORDER — LORAZEPAM 0.5 MG PO TABS
0.5000 mg | ORAL_TABLET | Freq: Every evening | ORAL | Status: DC | PRN
  Administered 2013-06-07: 0.5 mg via ORAL
  Filled 2013-06-07: qty 1

## 2013-06-07 MED ORDER — DEXTROSE 5 % IV SOLN
250.0000 mg | INTRAVENOUS | Status: DC
  Administered 2013-06-07: 250 mg via INTRAVENOUS
  Filled 2013-06-07 (×2): qty 250

## 2013-06-07 MED ORDER — LORAZEPAM 0.5 MG PO TABS
0.5000 mg | ORAL_TABLET | Freq: Once | ORAL | Status: AC
  Administered 2013-06-07: 0.5 mg via ORAL
  Filled 2013-06-07: qty 1

## 2013-06-07 NOTE — Progress Notes (Signed)
[facility] Progress Note  Subjective: The patient has no complaints. Stool still a little dark. Eating well.  Objective: Vital signs in last 24 hours: Temp:  [97.5 F (36.4 C)-98.8 F (37.1 C)] 98.8 F (37.1 C) (02/22 0429) Pulse Rate:  [68-99] 68 (02/22 0429) Resp:  [16-18] 18 (02/22 0429) BP: (121-152)/(71-95) 147/75 mmHg (02/22 0429) SpO2:  [95 %-100 %] 95 % (02/22 0930) Weight change:    PE: No distress  Abdomen soft and nontender  Hemoglobin is up.  Lab Results: Results for orders placed during the hospital encounter of 06/03/13 (from the past 24 hour(s))  GLUCOSE, CAPILLARY     Status: Abnormal   Collection Time    06/06/13  4:03 PM      Result Value Ref Range   Glucose-Capillary 145 (*) 70 - 99 mg/dL  GLUCOSE, CAPILLARY     Status: Abnormal   Collection Time    06/06/13  9:10 PM      Result Value Ref Range   Glucose-Capillary 125 (*) 70 - 99 mg/dL   Comment 1 Documented in Chart     Comment 2 Notify RN    GLUCOSE, CAPILLARY     Status: Abnormal   Collection Time    06/07/13  6:17 AM      Result Value Ref Range   Glucose-Capillary 104 (*) 70 - 99 mg/dL   Comment 1 Documented in Chart     Comment 2 Notify RN    CBC     Status: Abnormal   Collection Time    06/07/13  6:38 AM      Result Value Ref Range   WBC 9.2  4.0 - 10.5 K/uL   RBC 3.82 (*) 3.87 - 5.11 MIL/uL   Hemoglobin 11.0 (*) 12.0 - 15.0 g/dL   HCT 04.532.0 (*) 40.936.0 - 81.146.0 %   MCV 83.8  78.0 - 100.0 fL   MCH 28.8  26.0 - 34.0 pg   MCHC 34.4  30.0 - 36.0 g/dL   RDW 91.415.6 (*) 78.211.5 - 95.615.5 %   Platelets 255  150 - 400 K/uL    Studies/Results: @RISRSLT24 @    Assessment: Duodenal ulcer.  Anemia secondary to GI bleed from duodenal ulcer  Plan: She seems to be doing well clinically at this time. I would continue to treat her medically with PPI therapy, and she needs to avoid NSAIDs. Watch for further signs of bleeding or drop in hemoglobin. We will sign off at this time. Call on again  if needed.    Graylin ShiverGANEM,Quadry Kampa F 06/07/2013, 11:10 AM  Lab Results  Component Value Date   HGB 11.0* 06/07/2013   HGB 7.8* 06/06/2013   HGB 8.9* 06/05/2013   HCT 32.0* 06/07/2013   HCT 23.7* 06/06/2013   HCT 26.5* 06/05/2013   ALKPHOS 106 06/03/2013   ALKPHOS 69 12/14/2012   ALKPHOS 62 12/13/2012   AST 12 06/03/2013   AST 15 12/14/2012   AST 11 12/13/2012   ALT 7 06/03/2013   ALT 17 12/14/2012   ALT 16 12/13/2012

## 2013-06-07 NOTE — Evaluation (Signed)
Physical Therapy Evaluation Patient Details Name: Sheila Cisneros MRN: [id number] DOB: 25-Oct-1940 Today's Date: 06/07/2013 Time: 1245-1310 PT Time Calculation (min): 25 min  PT Assessment / Plan / Recommendation History of Present Illness    73 yo female h/o htn, ?dementia, copd comes in with 2 days of several melanotic stools and rlq abd pain that worsened today. Today she also had one episode of vomit which had some red color to it. She has been feeling overall weak. No dysuria or hematuria. Had colonoscopy a couple of years ago with polypectomy. Has h/o hepatitis but no h/o cirrhosis. No fevers. On rectal exam in ED stool was not melanotic or bloody but was heme positive. She has had no vomiting since arrival to ED, no stools. Pt has h/o dementia in her chart and is on aricept, she is oriented x 4 and seems to be giving appropriate answers without significant cognitive deficits.   Clinical Impression  Pt presents to PT with mild deconditioning consistent with illness and hospital course.  Previously pt has been physically active, attending water aerobics and limited by acute illness.  Expect pt to progress back to fully independent with occasional use of cane as illness resolves and she is more active.  Recommend PT follow acutely to assure progress and return toward prior level; recommend ambulate daily with nursing assist and OOB often to assist with airway clearance and resolution of acute respiratory problems.  Likely will not need HHPT at d/c, however will monitor over this admission and update recommendations as appropriate.      PT Assessment  Patient needs continued PT services    Follow Up Recommendations  Home health PT;Supervision - Intermittent    Does the patient have the potential to tolerate intense rehabilitation      Barriers to Discharge        Equipment Recommendations  None recommended by PT    Recommendations for Other Services     Frequency Min 3X/week     Precautions / Restrictions Precautions Precautions: Fall   Pertinent Vitals/Pain Congestion/cough, o/w no apparent distress       Mobility  Bed Mobility Overal bed mobility: Modified Independent General bed mobility comments: transitions to EOB with use of rail or mattress edge without additional physical assist or cues Transfers Overall transfer level: Needs assistance Equipment used: Straight cane Transfers: Sit to/from Stand Sit to Stand: Supervision General transfer comment: standby assist for safety, pt takes her time and needs a second to be stable Ambulation/Gait Ambulation/Gait assistance: Min guard Ambulation Distance (Feet): 150 Feet Assistive device: Straight cane Gait Pattern/deviations: Step-through pattern Gait velocity: unmeasured, decreased Gait velocity interpretation: Below normal speed for age/gender General Gait Details: mildly forward flexed over cane, slow and states her hips ache from being in the bed.    Exercises     PT Diagnosis: Difficulty walking  PT Problem List: Decreased mobility PT Treatment Interventions: Patient/family education;Therapeutic activities;Functional mobility training;Stair training;Gait training     PT Goals(Current goals can be found in the care plan section) Acute Rehab PT Goals PT Goal Formulation: With patient Time For Goal Achievement: 06/21/13 Potential to Achieve Goals: Good  Visit Information  Last PT Received On: 06/07/13 Assistance Needed: +1       Prior Functioning  Home Living Family/patient expects to be discharged to:: Private residence Living Arrangements: Spouse/significant other Available Help at Discharge: Family;Available PRN/intermittently Type of Home: House Home Access: Stairs to enter [employer]trance Stairs-Number of Steps: 3 Entrance Stairs-Rails: Can reach both Home  Layout: One level Home Equipment: Cane - single point Additional Comments: states she only uses cane for longer distance, that  house is small and she can '"reach the walls and such" to get around Prior Function Level of Independence: Independent with assistive device(s) Communication Communication: No difficulties    Cognition  Cognition Arousal/Alertness: Awake/alert Behavior During Therapy: WFL for tasks assessed/performed Overall Cognitive Status: Within Functional Limits for tasks assessed    Extremity/Trunk Assessment Upper Extremity Assessment Upper Extremity Assessment: Overall WFL for tasks assessed Lower Extremity Assessment Lower Extremity Assessment: Overall WFL for tasks assessed Cervical / Trunk Assessment Cervical / Trunk Assessment: Normal   Balance Balance Overall balance assessment: No apparent balance deficits (not formally assessed)  End of Session PT - End of Session Activity Tolerance: Patient tolerated treatment well Patient left: in chair;with nursing/sitter in room;with call bell/phone within reach Nurse Communication: Mobility status  GP     Dennis Bast 06/07/2013, 2:02 PM

## 2013-06-07 NOTE — Progress Notes (Signed)
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 Acute blood loss anemia; TF as needed;   3. AKI likely prerenal + diuretics+NSAIDS;  -improved on IVF, recheck BMP; hold diuretics   4. UTI started on IV atx; improved  5. CAP; CT: L lung infiltrate; COPD: no wheezing  -improving on IV atx, cont bronchodilators, oxygen as needed   6. DM not on meds at home; HA1-7.1; cont ISS   Code Status: full Family Communication: d/w patient, called updated [place],Herbert Spouse [phone]   (indicate person spoken with, relationship, and if by phone, the number) Disposition Plan: pend clincial improvement    Consultants:  GI  Procedures:  none  Antibiotics:  ceftriaxone 2/19 (indicate start date, and stop date if known)  HPI/Subjective: alert  Objective: Filed Vitals:   06/07/13 0429  BP: 147/75  Pulse: 68  Temp: 98.8 F (37.1 C)  Resp: 18    Intake/Output Summary (Last 24 hours) at 06/07/13 0832 Last data filed at 06/07/13 0243  Gross per 24 hour  Intake   1605 ml  Output      0 ml  Net   1605 ml   Filed Weights   06/04/13 0530  Weight: 54.885 kg (121 lb)    Exam:   General:  alert  Cardiovascular: s1,s2 rrr  Respiratory: CTA BL  Abdomen: soft, nt, nd   Musculoskeletal: no edema    Data Reviewed: Basic Metabolic Panel:  Recent Labs Lab 06/03/13 2151 06/04/13 0850 06/05/13 0318  NA 141 140 144  K 3.2* 3.1* 3.3*  CL 96 98 106  CO2 30 27 25   GLUCOSE 168* 86 86  BUN 39* 36* 23   CREATININE 1.40* 1.20* 1.07  CALCIUM 10.1 9.8 8.8   Liver Function Tests:  Recent Labs Lab 06/03/13 2151  AST 12  ALT 7  ALKPHOS 106  BILITOT <0.2*  PROT 7.3  ALBUMIN 2.8*   No results found for this basename: LIPASE, AMYLASE,  in the last 168 hours No results found for this basename: AMMONIA,  in the last 168 hours CBC:  Recent Labs Lab 06/03/13 2151 06/04/13 0850  06/05/13 0110 06/05/13 0318 06/05/13 0754 06/06/13 0326 06/07/13 0638  WBC 11.3* 9.4  --   --  10.2  --  8.4 9.2  HGB 7.9* 9.1*  < > 7.8* 8.3* 8.9* 7.8* 11.0*  HCT 24.4* 27.0*  < > 23.7* 25.2* 26.5* 23.7* 32.0*  MCV 84.1 84.4  --   --  84.6  --  85.3 83.8  PLT 355 293  --   --  292  --  289 255  < > = values in this interval not displayed. Cardiac Enzymes: No results found for this basename: CKTOTAL, CKMB, CKMBINDEX, TROPONINI,  in the last 168 hours BNP (last 3 results)  Recent Labs  11/09/12 1638  PROBNP 47.3   CBG:  Recent Labs Lab 06/06/13 0637 06/06/13 1102 06/06/13 1603 06/06/13 2110 06/07/13 0617  GLUCAP 104* 134* 145* 125* 104*    Recent Results (from the  past 240 hour(s))  H.PYLORI ANTIGEN, STOOL     Status: None   Collection Time    06/05/13  7:30 AM      Result Value Ref Range Status   Specimen Description STOOL   Final   Special Requests NONE   Final   H. pylori ag, stool     Final   Value: NEGATIVE Antimicrobials,proton pump inhibitors and bismuth preparations are known to suppress H.pylori and ingestion of these prior to testing may cause a false negative result. If a negative result is obtained for a patient that has  ingested these      compounds within two weeks prior of performing the H.pylori test, results may be falsely negative and should be repeated with a new specimen two weeks after discontinuing treatment.     Performed at [facility]   Report Status 06/06/2013 FINAL   Final     Studies: No results found.  Scheduled Meds: . albuterol  2.5 mg  Nebulization TID  . azithromycin  500 mg Intravenous Q24H  . cefTRIAXone (ROCEPHIN)  IV  1 g Intravenous Q24H  . insulin aspart  0-9 Units Subcutaneous TID AC & HS  . pantoprazole (PROTONIX) IV  40 mg Intravenous Q12H  . sodium chloride  3 mL Intravenous Q12H   Continuous Infusions:    Principal Problem:   Rectal bleeding Active Problems:   HYPERTENSION   HEPATITIS   Bronchiectasis without acute exacerbation   Anemia associated with acute blood loss   Abdominal pain, acute, right lower quadrant   GIB (gastrointestinal bleeding)    Time spent: >35 minutes     Esperanza SheetsBURIEV, Aymen Widrig N  [facility] Pager [phone]. If 7PM-7AM, please contact night-coverage at [email or web address], password [email or web address] 06/07/2013, 8:32 AM  LOS: 4 days

## 2013-06-08 LAB — CBC
HCT: 32.8 % — ABNORMAL LOW (ref 36.0–46.0)
Hemoglobin: 11 g/dL — ABNORMAL LOW (ref 12.0–15.0)
MCH: 28.4 pg (ref 26.0–34.0)
MCHC: 33.5 g/dL (ref 30.0–36.0)
MCV: 84.5 fL (ref 78.0–100.0)
Platelets: 289 10*3/uL (ref 150–400)
RBC: 3.88 MIL/uL (ref 3.87–5.11)
RDW: 15.6 % — ABNORMAL HIGH (ref 11.5–15.5)
WBC: 11 10*3/uL — AB (ref 4.0–10.5)

## 2013-06-08 LAB — GLUCOSE, CAPILLARY: GLUCOSE-CAPILLARY: 91 mg/dL (ref 70–99)

## 2013-06-08 MED ORDER — PANTOPRAZOLE SODIUM 40 MG PO TBEC
40.0000 mg | DELAYED_RELEASE_TABLET | Freq: Two times a day (BID) | ORAL | Status: DC
  Administered 2013-06-08: 40 mg via ORAL
  Filled 2013-06-08: qty 1

## 2013-06-08 MED ORDER — ALBUTEROL SULFATE (5 MG/ML) 0.5% IN NEBU: 2.5000 mg | INHALATION_SOLUTION | RESPIRATORY_TRACT | Status: DC | PRN

## 2013-06-08 MED ORDER — LEVOFLOXACIN 500 MG PO TABS: 500.0000 mg | ORAL_TABLET | Freq: Every day | ORAL | Status: DC

## 2013-06-08 MED ORDER — FUROSEMIDE 40 MG PO TABS: 20.0000 mg | ORAL_TABLET | Freq: Every day | ORAL | Status: DC

## 2013-06-08 MED ORDER — ALBUTEROL SULFATE HFA 108 (90 BASE) MCG/ACT IN AERS: 2.0000 | INHALATION_SPRAY | Freq: Four times a day (QID) | RESPIRATORY_TRACT | Status: DC | PRN

## 2013-06-08 NOTE — Progress Notes (Signed)
Nursing note  Patient given discharge instructions, AVS medication list and paper prescriptions given to patient. All questionswere answered will discharge home as ordered. Deryl Ports, Randall AnKristin Jessup RN

## 2013-06-08 NOTE — Discharge Summary (Signed)
Physician Discharge Summary  Sheila Cisneros ZOX:[id number] DOB: January 16, 1941 DOA: 06/03/2013  PCP: Alva Garnet., MD  Admit date: 06/03/2013 Discharge date: 06/08/2013  Time spent: >35 minutes  Recommendations for Outpatient Follow-up:  HHC F/u with PCP in 1-2 week  Discharge Diagnoses:  Principal Problem:   Rectal bleeding Active Problems:   HYPERTENSION   HEPATITIS   Bronchiectasis without acute exacerbation   Anemia associated with acute blood loss   Abdominal pain, acute, right lower quadrant   GIB (gastrointestinal bleeding)   Discharge Condition: stable   Diet recommendation: heart healthy, DM   Filed Weights   06/04/13 0530  Weight: 54.885 kg (121 lb)    History of present illness:  73 y/o female with PMH of HTN, GERD, COPD, DM, dementia, 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 Acute blood loss anemia; stable Hg 3. AKI likely prerenal + NSAIDS;  -resolved on IVF, resume low dose diuretic; BMP in 1 week  4. UTI started on IV atx;resolved  5. CAP; CT: L lung infiltrate; COPD: no wheezing  -improved on IV atx, cont bronchodilators, oxygen as needed; changed to PO atx upon discharge  6. DM not on meds at home; HA1-7.1; resume home regimen  7. HTN stable off hctz/trimethoprime; resumed verapamil, low dose lasix     Procedures:  EGD (i.e. Studies not automatically included, echos, thoracentesis, etc; not x-rays)  Consultations:  GI  Discharge Exam: Filed Vitals:   06/08/13 0400  BP: 146/79  Pulse: 74  Temp: 99 F (37.2 C)  Resp: 21    General: alert Cardiovascular: s1,s2 rrr Respiratory: CTA BL  Discharge Instructions  Discharge Orders   Future  Appointments Provider Department Dept Phone   06/16/2013 10:30 AM Waymon Budge, MD [facility] [phone]   06/29/2013 11:00 AM Baltazar Apo, PA-C [facility] [phone]   Future Orders Complete By Expires   Diet - low sodium heart healthy  As directed    Discharge instructions  As directed    Comments:     Please follow up with primary care doctor in 1-2 weeeks   Increase activity slowly  As directed        Medication List    STOP taking these medications       aspirin 325 MG EC tablet     BLACK COHOSH PO     etodolac 400 MG tablet  Commonly known as:  LODINE     guaiFENesin-dextromethorphan 100-10 MG/5ML syrup  Commonly known as:  ROBITUSSIN DM     triamterene-hydrochlorothiazide 37.5-25 MG per tablet  Commonly known as:  MAXZIDE-25      TAKE these medications       albuterol (5 MG/ML) 0.5% nebulizer solution  Commonly known as:  PROVENTIL  Take 0.5 mLs (2.5 mg total) by nebulization every 2 (two) hours as needed for wheezing or shortness of breath.     albuterol 108 (90 BASE) MCG/ACT inhaler  Commonly known as:  PROAIR HFA  Inhale 2 puffs into the lungs every 6 (six) hours as needed for wheezing or shortness of breath.     ALLEGRA-D 12 HOUR 60-120 MG per tablet  Generic  drug:  fexofenadine-pseudoephedrine  Take 1 tablet by mouth 2 (two) times daily.     benzonatate 100 MG capsule  Commonly known as:  TESSALON  Take 100 mg by mouth 2 (two) times daily.     chlorpheniramine-HYDROcodone 10-8 MG/5ML Lqcr  Commonly known as:  TUSSIONEX  Take 5 mLs by mouth every 12 (twelve) hours as needed.     cholecalciferol 1000 UNITS tablet  Commonly known as:  VITAMIN D  Take 1,000 Units by mouth daily.     donepezil 5 MG tablet  Commonly known as:  ARICEPT  Take 5 mg by mouth at bedtime.     fish oil-omega-3 fatty acids 1000 MG capsule  Take 2 g by mouth daily.     furosemide 40 MG tablet  Commonly known as:  LASIX  Take  0.5 tablets (20 mg total) by mouth daily.     IRON PO  Take 1 tablet by mouth daily.     JANUMET PO  Take 1 tablet by mouth every evening.     levofloxacin 500 MG tablet  Commonly known as:  LEVAQUIN  Take 1 tablet (500 mg total) by mouth daily.     multivitamin capsule  Take 1 capsule by mouth daily.     potassium chloride SA 20 MEQ tablet  Commonly known as:  K-DUR,KLOR-CON  Take 20 mEq by mouth 2 (two) times daily.     simvastatin 20 MG tablet  Commonly known as:  ZOCOR  Take 20 mg by mouth at bedtime.     St Johns Wort 300 MG Caps  Take 300 mg by mouth daily.     traMADol 50 MG tablet  Commonly known as:  ULTRAM  Take 2 tablets (100 mg total) by mouth 3 (three) times daily.     TYLENOL 500 MG tablet  Generic drug:  acetaminophen  Take 500 mg by mouth every 6 (six) hours as needed. For pain     venlafaxine 37.5 MG tablet  Commonly known as:  EFFEXOR  Take 37.5 mg by mouth daily as needed (hot flashes).     verapamil 240 MG (CO) 24 hr tablet  Commonly known as:  COVERA HS  Take 240 mg by mouth at bedtime.     vitamin C 500 MG tablet  Commonly known as:  ASCORBIC ACID  Take 500 mg by mouth daily.       Allergies  Allergen Reactions  . Codeine Rash     angioedema  . Penicillins Itching and Rash       . Tetanus Toxoids Rash  . Neomycin Itching  . Levofloxacin Rash       Follow-up Information   Follow up with Alva Garnet., MD In 1 week.   Specialty:  Internal Medicine   Contact information:   [address] [place] [address] [phone]       Follow up with [facility]     In 1 week.   Contact information:   [address] [place] [address] [phone]       The results of significant diagnostics from this hospitalization (including imaging, microbiology, ancillary and laboratory) are listed below for reference.    Significant Diagnostic Studies: Ct Abdomen Pelvis W  Contrast  06/04/2013   CLINICAL DATA:  Pain.  EXAM: CT ABDOMEN AND PELVIS WITH CONTRAST  TECHNIQUE: Multidetector CT imaging of the abdomen and pelvis was performed using the standard protocol following bolus administration of intravenous  contrast.  CONTRAST:  80mL OMNIPAQUE IOHEXOL 300 MG/ML  SOLN  COMPARISON:  CT ABD/PELVIS W CM dated 10/08/2011  FINDINGS: Included view of the lung bases demonstrates patchy consolidation in the left lung base with bronchiectasis and scarring. The heart and pericardium are unremarkable.  Moderate hiatal hernia. Remainder of the stomach is unremarkable. Small and large bowel are normal in course and caliber without inflammatory changes. No gastric wall thickening on today's examination. Contrast as yet to reach the large bowel.  14 mm cyst in the segment 7 of the liver, unchanged. The liver is otherwise unremarkable. The gallbladder, spleen, adrenal glands are nonsuspicious. Atrophic pancreas is unchanged.  No intraperitoneal free fluid nor free air. 2 mm cortical based right renal interpolar calcification. Additional too small to characterize hypodensities in the right kidney, the kidneys are otherwise unremarkable. Delayed imaging demonstrates prompt symmetric excretion into the proximal urinary collecting systems. Aortoiliac vessels are normal in course and caliber with mild calcific atherosclerosis. Status post hysterectomy. Urinary bladder is partially distended unremarkable.  Soft tissues are unremarkable. Grade 2 L4-5 anterolisthesis without pars interarticularis defects. Moderate L4-5 canal stenosis and moderate to severe neural foraminal narrowing at this level. Moderate to severe L3-4 neural foraminal narrowing. Small fat containing umbilical hernia.  IMPRESSION: Patchy consolidation in the left lung base concerning for pneumonia, in a background of bronchiectasis and scarring.  No acute intra-abdominal or pelvic process. No gastric wall thickening on today's examination.   Moderate hiatal hernia.   Electronically Signed   By: Awilda Metroourtnay  Bloomer   On: 06/04/2013 01:45    Microbiology: Recent Results (from the past 240 hour(s))  H.PYLORI ANTIGEN, STOOL     Status: None   Collection Time    06/05/13  7:30 AM      Result Value Ref Range Status   Specimen Description STOOL   Final   Special Requests NONE   Final   H. pylori ag, stool     Final   Value: NEGATIVE Antimicrobials,proton pump inhibitors and bismuth preparations are known to suppress H.pylori and ingestion of these prior to testing may cause a false negative result. If a negative result is obtained for a patient that has  ingested these      compounds within two weeks prior of performing the H.pylori test, results may be falsely negative and should be repeated with a new specimen two weeks after discontinuing treatment.     Performed at [facility]   Report Status 06/06/2013 FINAL   Final     Labs: Basic Metabolic Panel:  Recent Labs Lab 06/03/13 2151 06/04/13 0850 06/05/13 0318  NA 141 140 144  K 3.2* 3.1* 3.3*  CL 96 98 106  CO2 30 27 25   GLUCOSE 168* 86 86  BUN 39* 36* 23  CREATININE 1.40* 1.20* 1.07  CALCIUM 10.1 9.8 8.8   Liver Function Tests:  Recent Labs Lab 06/03/13 2151  AST 12  ALT 7  ALKPHOS 106  BILITOT <0.2*  PROT 7.3  ALBUMIN 2.8*   No results found for this basename: LIPASE, AMYLASE,  in the last 168 hours No results found for this basename: AMMONIA,  in the last 168 hours CBC:  Recent Labs Lab 06/04/13 0850  06/05/13 0318 06/05/13 0754 06/06/13 0326 06/07/13 0638 06/08/13 0340  WBC 9.4  --  10.2  --  8.4 9.2 11.0*  HGB 9.1*  < > 8.3* 8.9* 7.8* 11.0* 11.0*  HCT 27.0*  < > 25.2* 26.5* 23.7* 32.0* 32.8*  MCV 84.4  --  84.6  --  85.3 83.8 84.5  PLT 293  --  292  --  289 255 289  < > = values in this interval not displayed. Cardiac Enzymes: No results found for this basename: CKTOTAL, CKMB, CKMBINDEX, TROPONINI,  in the last 168 hours BNP: BNP  (last 3 results)  Recent Labs  11/09/12 1638  PROBNP 47.3   CBG:  Recent Labs Lab 06/07/13 0617 06/07/13 1107 06/07/13 1558 06/07/13 2058 06/08/13 0605  GLUCAP 104* 113* 124* 90 91       Signed:  Amarise Lillo N  [facility] 06/08/2013, 7:41 AM

## 2013-06-15 ENCOUNTER — Encounter (HOSPITAL_COMMUNITY): Payer: Self-pay | Admitting: Emergency Medicine

## 2013-06-15 ENCOUNTER — Emergency Department (HOSPITAL_COMMUNITY)
Admission: EM | Admit: 2013-06-15 | Discharge: 2013-06-15 | Disposition: A | Discharge status: Home / Self Care | Payer: Medicare Other | Attending: Emergency Medicine | Admitting: Emergency Medicine

## 2013-06-15 ENCOUNTER — Emergency Department (HOSPITAL_COMMUNITY): Payer: Medicare Other

## 2013-06-15 DIAGNOSIS — Z8719 Personal history of other diseases of the digestive system: Secondary | ICD-10-CM | POA: Insufficient documentation

## 2013-06-15 DIAGNOSIS — I10 Essential (primary) hypertension: Secondary | ICD-10-CM | POA: Insufficient documentation

## 2013-06-15 DIAGNOSIS — J4489 Other specified chronic obstructive pulmonary disease: Secondary | ICD-10-CM | POA: Insufficient documentation

## 2013-06-15 DIAGNOSIS — Z8701 Personal history of pneumonia (recurrent): Secondary | ICD-10-CM | POA: Insufficient documentation

## 2013-06-15 DIAGNOSIS — Z880 Allergy status to penicillin: Secondary | ICD-10-CM | POA: Insufficient documentation

## 2013-06-15 DIAGNOSIS — Z87828 Personal history of other (healed) physical injury and trauma: Secondary | ICD-10-CM | POA: Insufficient documentation

## 2013-06-15 DIAGNOSIS — R21 Rash and other nonspecific skin eruption: Secondary | ICD-10-CM | POA: Insufficient documentation

## 2013-06-15 DIAGNOSIS — Z872 Personal history of diseases of the skin and subcutaneous tissue: Secondary | ICD-10-CM | POA: Insufficient documentation

## 2013-06-15 DIAGNOSIS — E119 Type 2 diabetes mellitus without complications: Secondary | ICD-10-CM | POA: Insufficient documentation

## 2013-06-15 DIAGNOSIS — Z792 Long term (current) use of antibiotics: Secondary | ICD-10-CM | POA: Insufficient documentation

## 2013-06-15 DIAGNOSIS — M19019 Primary osteoarthritis, unspecified shoulder: Secondary | ICD-10-CM | POA: Insufficient documentation

## 2013-06-15 DIAGNOSIS — G8929 Other chronic pain: Secondary | ICD-10-CM | POA: Insufficient documentation

## 2013-06-15 DIAGNOSIS — Z79899 Other long term (current) drug therapy: Secondary | ICD-10-CM | POA: Insufficient documentation

## 2013-06-15 DIAGNOSIS — J449 Chronic obstructive pulmonary disease, unspecified: Secondary | ICD-10-CM | POA: Insufficient documentation

## 2013-06-15 MED ORDER — ONDANSETRON HCL 8 MG PO TABS
4.0000 mg | ORAL_TABLET | Freq: Once | ORAL | Status: AC
  Administered 2013-06-15: 4 mg via ORAL

## 2013-06-15 MED ORDER — OXYCODONE-ACETAMINOPHEN 5-325 MG PO TABS: 1.0000 | ORAL_TABLET | ORAL | Status: DC | PRN

## 2013-06-15 MED ORDER — OXYCODONE-ACETAMINOPHEN 5-325 MG PO TABS
1.0000 | ORAL_TABLET | Freq: Once | ORAL | Status: AC
  Administered 2013-06-15: 1 via ORAL
  Filled 2013-06-15: qty 1

## 2013-06-15 NOTE — ED Provider Notes (Signed)
CSN: [id number]     Arrival date & time 06/15/13  1257 History  This chart was scribed for non-physician practitioner, Ivery Quale, PA-C working with Suzi Roots, MD by Greggory Stallion, ED scribe. This patient was seen in room TR07C/TR07C and the patient's care was started at 2:40 PM.   Chief Complaint  Patient presents with  . Shoulder Pain  . Rash   The history is provided by the patient. No language interpreter was used.   HPI Comments: Sheila Cisneros is a 73 y.o. female who presents to the Emergency Department complaining of intermittent right shoulder pain with associated swelling that started 2 weeks ago. She states it worsened yesterday. Denies injury. Certain movements and palpation worsen the pain. She has taken hydrocodone with little relief. Pt was recently discharged from the hospital for an ulcer. She has a prior rotator cuff injury in her right shoulder.   Past Medical History  Diagnosis Date  . Gout, unspecified   . Other chronic sinusitis   . Unspecified chronic bronchitis     sees Dr. Melba Coon, last visit 08/2011, treated for E. Coli- resp. /w Ceftin, Feb. 2013  . Hypertension   . GERD (gastroesophageal reflux disease)     rolaids if needed  . COPD (chronic obstructive pulmonary disease)   . Pneumonia     "couple times; long time ago" (12/09/2012)  . Chronic bronchitis     "couple times/yr" (12/09/2012)  . Exertional shortness of breath   . History of blood transfusion     "related to a surgery, I think" (12/09/2012)  . Hepatitis, unspecified     "the one that's not bad" (12/09/2012)  . Migraines     "in the past" (12/09/2012)  . Osteoarthrosis, unspecified whether generalized or localized, unspecified site   . Arthritis     "all over" (12/09/2012)  . Chronic lower back pain   . Altered mental status 12/09/2012  . Diabetes mellitus without complication    Past Surgical History  Procedure Laterality Date  . Tubal ligation    . Tonsillectomy    . Shoulder  arthroscopy w/ rotator cuff repair Right   . Carpal tunnel release  09/06/2011    Procedure: CARPAL TUNNEL RELEASE;  Surgeon: Kennieth Rad, MD;  Location: [facility];  Service: Orthopedics;  Laterality: Right;  . Abdominal hysterectomy      "w/right ovary" (12/09/2012)  . Appendectomy    . Breast biopsy Right   . Esophagogastroduodenoscopy N/A 06/04/2013    Procedure: ESOPHAGOGASTRODUODENOSCOPY (EGD);  Surgeon: Graylin Shiver, MD;  Location: [facility] ENDOSCOPY;  Service: Endoscopy;  Laterality: N/A;   Family History  Problem Relation Age of Onset  . Heart disease Father   . ALS Brother    History  Substance Use Topics  . Smoking status: Never Smoker   . Smokeless tobacco: Never Used  . Alcohol Use: Yes     Comment: 12/09/2012 "have a taste yearly"   OB History   Grav Para Term Preterm Abortions TAB SAB Ect Mult Living                 Review of Systems  Musculoskeletal: Positive for arthralgias.  All other systems reviewed and are negative.   Allergies  Codeine; Penicillins; Tetanus toxoids; Neomycin; and Levofloxacin  Home Medications   Current Outpatient Rx  Name  Route  Sig  Dispense  Refill  . acetaminophen (TYLENOL) 500 MG tablet   Oral   Take 500 mg by mouth every  6 (six) hours as needed. For pain         . albuterol (PROAIR HFA) 108 (90 BASE) MCG/ACT inhaler   Inhalation   Inhale 2 puffs into the lungs every 6 (six) hours as needed for wheezing or shortness of breath.   1 Inhaler   2   . albuterol (PROVENTIL) (5 MG/ML) 0.5% nebulizer solution   Nebulization   Take 0.5 mLs (2.5 mg total) by nebulization every 2 (two) hours as needed for wheezing or shortness of breath.   20 mL   12   . Ascorbic Acid (VITAMIN C) 500 MG tablet   Oral   Take 500 mg by mouth daily.           . benzonatate (TESSALON) 100 MG capsule   Oral   Take 100 mg by mouth 2 (two) times daily.         . chlorpheniramine-HYDROcodone (TUSSIONEX) 10-8 MG/5ML LQCR   Oral   Take 5 mLs by  mouth every 12 (twelve) hours as needed.   180 mL   0   . cholecalciferol (VITAMIN D) 1000 UNITS tablet   Oral   Take 1,000 Units by mouth daily.         Marland Kitchen. donepezil (ARICEPT) 5 MG tablet   Oral   Take 5 mg by mouth at bedtime.         . fexofenadine-pseudoephedrine (ALLEGRA-D 12 HOUR) 60-120 MG per tablet   Oral   Take 1 tablet by mouth 2 (two) times daily.           . fish oil-omega-3 fatty acids 1000 MG capsule   Oral   Take 2 g by mouth daily.         . furosemide (LASIX) 40 MG tablet   Oral   Take 0.5 tablets (20 mg total) by mouth daily.   30 tablet   0   . IRON PO   Oral   Take 1 tablet by mouth daily.         Marland Kitchen. levofloxacin (LEVAQUIN) 500 MG tablet   Oral   Take 1 tablet (500 mg total) by mouth daily.   3 tablet   0   . Multiple Vitamin (MULTIVITAMIN) capsule   Oral   Take 1 capsule by mouth daily.           . potassium chloride SA (K-DUR,KLOR-CON) 20 MEQ tablet   Oral   Take 20 mEq by mouth 2 (two) times daily.           . simvastatin (ZOCOR) 20 MG tablet   Oral   Take 20 mg by mouth at bedtime.           . SitaGLIPtin-MetFORMIN HCl (JANUMET PO)   Oral   Take 1 tablet by mouth every evening.         . St Johns Wort 300 MG CAPS   Oral   Take 300 mg by mouth daily.         . traMADol (ULTRAM) 50 MG tablet   Oral   Take 2 tablets (100 mg total) by mouth 3 (three) times daily.   180 tablet   0   . venlafaxine (EFFEXOR) 37.5 MG tablet   Oral   Take 37.5 mg by mouth daily as needed (hot flashes).         . verapamil (COVERA HS) 240 MG (CO) 24 hr tablet   Oral   Take 240 mg by mouth at bedtime.  BP 166/85  Pulse 110  Temp(Src) 98.1 F (36.7 C)  Resp 18  Ht 5' (1.524 m)  Wt 120 lb (54.432 kg)  BMI 23.44 kg/m2  SpO2 95%  Physical Exam  Nursing note and vitals reviewed. Constitutional: She is oriented to person, place, and time. She appears well-developed and well-nourished. No distress.  HENT:   Head: Normocephalic and atraumatic.  Eyes: EOM are normal.  Neck: Neck supple. No tracheal deviation present.  Cardiovascular: Normal rate.   Pulmonary/Chest: Effort normal and breath sounds normal. No respiratory distress. She has no wheezes. She has no rhonchi. She has no rales.  Musculoskeletal: Normal range of motion.  Pain to palpation of the right shoulder. Degenerative changes of the hands and wrist. No forearm deformity. Radial pulses 2+. No edema. No hot joint.  Neurological: She is alert and oriented to person, place, and time.  Skin: Skin is warm and dry.  Psychiatric: She has a normal mood and affect. Her behavior is normal.    ED Course  Procedures (including critical care time)  DIAGNOSTIC STUDIES: Oxygen Saturation is 95% on RA, adequate by my interpretation.    COORDINATION OF CARE: 2:45 PM-Discussed treatment plan which includes pain medication and reviewing old records with pt at bedside and pt agreed to plan.   Labs Review Labs Reviewed - No data to display Imaging Review No results found.   EKG Interpretation None      MDM Patient presents to the emergency department with complaint of increasing right shoulder pain. She has pain with movement and pain with palpation. X-ray Of the shoulder reveals degenerative glenohumeral arthritis, as well as a.c. joint arthropathy.  Test results discussed with the patient and family. The plan at this time is for the patient to see Dr. Montez Morita for evaluation of this problem. Prescription for Percocet one every 6 hours given for pain. Patient also advised to apply heat to the shoulder. Patient in agreement with this discharge plan.    Final diagnoses:  None    **I have reviewed nursing notes, vital signs, and all appropriate lab and imaging results for this patient.*  **I personally performed the services described in this documentation, which was scribed in my presence. The recorded information has been reviewed and is  accurate.Kathie Dike, PA-C 06/15/13 1645

## 2013-06-15 NOTE — ED Notes (Addendum)
Pt c/o R shoulder pain, intermittent, for at least a week.  The pain is becoming unbearable.  Pain increases with movement and palpation.  Denies injuring arm.  Hx of rotator cuff repair.  Pt also with pruritic rash for several weeks.

## 2013-06-15 NOTE — ED Notes (Signed)
Patient states she is having pain in her right shoulder anterior part of right shoulder is swollen and very painful to touch. C/o rash on chest and right arm. States she was just discharged from the hospital last week with ulcer. Denies injury to shoulder.

## 2013-06-15 NOTE — Discharge Instructions (Signed)
You have advanced arthritis of multiple areas of your shoulder. Heating pad maybe helpful. Please use Tylenol for mild pain, use Percocet for more severe pain. Percocet may cause drowsiness, and/or constipation. Please use with caution. Please see Dr. Montez Moritaarter for additional evaluation and management of your shoulder pain. Osteoarthritis Osteoarthritis is a disease that causes soreness and swelling (inflammation) of a joint. It occurs when the cartilage at the affected joint wears down. Cartilage acts as a cushion, covering the ends of bones where they meet to form a joint. Osteoarthritis is the most common form of arthritis. It often occurs in older people. The joints affected most often by this condition include those in the:  Ends of the fingers.  Thumbs.  Neck.  Lower back.  Knees.  Hips. CAUSES  Over time, the cartilage that covers the ends of bones begins to wear away. This causes bone to rub on bone, producing pain and stiffness in the affected joints.  RISK FACTORS Certain factors can increase your chances of having osteoarthritis, including:  Older age.  Excessive body weight.  Overuse of joints. SIGNS AND SYMPTOMS   Pain, swelling, and stiffness in the joint.  Over time, the joint may lose its normal shape.  Small deposits of bone (osteophytes) may grow on the edges of the joint.  Bits of bone or cartilage can break off and float inside the joint space. This may cause more pain and damage. DIAGNOSIS  Your health care provider will do a physical exam and ask about your symptoms. Various tests may be ordered, such as:  X-rays of the affected joint.  An MRI scan.  Blood tests to rule out other types of arthritis.  Joint fluid tests. This involves using a needle to draw fluid from the joint and examining the fluid under a microscope. TREATMENT  Goals of treatment are to control pain and improve joint function. Treatment plans may include:  A prescribed exercise  program that allows for rest and joint relief.  A weight control plan.  Pain relief techniques, such as:  Properly applied heat and cold.  Electric pulses delivered to nerve endings under the skin (transcutaneous electrical nerve stimulation, TENS).  Massage.  Certain nutritional supplements.  Medicines to control pain, such as:  Acetaminophen.  Nonsteroidal anti-inflammatory drugs (NSAIDs), such as naproxen.  Narcotic or central-acting agents, such as tramadol.  Corticosteroids. These can be given orally or as an injection.  Surgery to reposition the bones and relieve pain (osteotomy) or to remove loose pieces of bone and cartilage. Joint replacement may be needed in advanced states of osteoarthritis. HOME CARE INSTRUCTIONS   Only take over-the-counter or prescription medicines as directed by your health care provider. Take all medicines exactly as instructed.  Maintain a healthy weight. Follow your health care provider's instructions for weight control. This may include dietary instructions.  Exercise as directed. Your health care provider can recommend specific types of exercise. These may include:  Strengthening exercises These are done to strengthen the muscles that support joints affected by arthritis. They can be performed with weights or with exercise bands to add resistance.  Aerobic activities These are exercises, such as brisk walking or low-impact aerobics, that get your heart pumping.  Range-of-motion activities These keep your joints limber.  Balance and agility exercises These help you maintain daily living skills.  Rest your affected joints as directed by your health care provider.  Follow up with your health care provider as directed. SEEK MEDICAL CARE IF:   Your  skin turns red.  You develop a rash in addition to your joint pain.  You have worsening joint pain. SEEK IMMEDIATE MEDICAL CARE IF:  You have a significant loss of weight or  appetite.  You have a fever along with joint or muscle aches.  You have night sweats. FOR MORE INFORMATION  National Institute of Arthritis and Musculoskeletal and Skin Diseases: www.niams.http://www.myers.net/ [employer] on Aging: https://walker.com/ American College of Rheumatology: www.rheumatology.org Document Released: 04/02/2005 Document Revised: 01/21/2013 Document Reviewed: 12/08/2012 [facility] Patient Information 2014 [place], [place].

## 2013-06-16 ENCOUNTER — Ambulatory Visit: Payer: Medicare Other | Admitting: Internal Medicine

## 2013-06-20 ENCOUNTER — Encounter (HOSPITAL_COMMUNITY): Payer: Self-pay | Admitting: Emergency Medicine

## 2013-06-20 ENCOUNTER — Emergency Department (HOSPITAL_COMMUNITY): Payer: Medicare Other

## 2013-06-20 ENCOUNTER — Inpatient Hospital Stay (HOSPITAL_COMMUNITY)
Admission: EM | Admit: 2013-06-20 | Discharge: 2013-06-23 | DRG: 641 | Disposition: A | Discharge status: Skilled Nursing Facility | Payer: Medicare Other | Attending: Internal Medicine | Admitting: Internal Medicine

## 2013-06-20 DIAGNOSIS — E875 Hyperkalemia: Secondary | ICD-10-CM

## 2013-06-20 DIAGNOSIS — I10 Essential (primary) hypertension: Secondary | ICD-10-CM

## 2013-06-20 DIAGNOSIS — Z8249 Family history of ischemic heart disease and other diseases of the circulatory system: Secondary | ICD-10-CM

## 2013-06-20 DIAGNOSIS — M1990 Unspecified osteoarthritis, unspecified site: Secondary | ICD-10-CM

## 2013-06-20 DIAGNOSIS — D62 Acute posthemorrhagic anemia: Secondary | ICD-10-CM

## 2013-06-20 DIAGNOSIS — M719 Bursopathy, unspecified: Secondary | ICD-10-CM | POA: Diagnosis present

## 2013-06-20 DIAGNOSIS — M47812 Spondylosis without myelopathy or radiculopathy, cervical region: Secondary | ICD-10-CM | POA: Diagnosis present

## 2013-06-20 DIAGNOSIS — K759 Inflammatory liver disease, unspecified: Secondary | ICD-10-CM

## 2013-06-20 DIAGNOSIS — Z881 Allergy status to other antibiotic agents status: Secondary | ICD-10-CM

## 2013-06-20 DIAGNOSIS — J449 Chronic obstructive pulmonary disease, unspecified: Secondary | ICD-10-CM | POA: Diagnosis present

## 2013-06-20 DIAGNOSIS — G309 Alzheimer's disease, unspecified: Secondary | ICD-10-CM | POA: Diagnosis present

## 2013-06-20 DIAGNOSIS — M4120 Other idiopathic scoliosis, site unspecified: Secondary | ICD-10-CM

## 2013-06-20 DIAGNOSIS — R739 Hyperglycemia, unspecified: Secondary | ICD-10-CM

## 2013-06-20 DIAGNOSIS — J328 Other chronic sinusitis: Secondary | ICD-10-CM

## 2013-06-20 DIAGNOSIS — R5381 Other malaise: Secondary | ICD-10-CM | POA: Diagnosis present

## 2013-06-20 DIAGNOSIS — Z880 Allergy status to penicillin: Secondary | ICD-10-CM

## 2013-06-20 DIAGNOSIS — D72829 Elevated white blood cell count, unspecified: Secondary | ICD-10-CM

## 2013-06-20 DIAGNOSIS — M109 Gout, unspecified: Secondary | ICD-10-CM

## 2013-06-20 DIAGNOSIS — E119 Type 2 diabetes mellitus without complications: Secondary | ICD-10-CM

## 2013-06-20 DIAGNOSIS — J440 Chronic obstructive pulmonary disease with acute lower respiratory infection: Secondary | ICD-10-CM

## 2013-06-20 DIAGNOSIS — L02619 Cutaneous abscess of unspecified foot: Secondary | ICD-10-CM

## 2013-06-20 DIAGNOSIS — F0280 Dementia in other diseases classified elsewhere without behavioral disturbance: Secondary | ICD-10-CM

## 2013-06-20 DIAGNOSIS — E872 Acidosis, unspecified: Secondary | ICD-10-CM

## 2013-06-20 DIAGNOSIS — J4489 Other specified chronic obstructive pulmonary disease: Secondary | ICD-10-CM | POA: Diagnosis present

## 2013-06-20 DIAGNOSIS — M67919 Unspecified disorder of synovium and tendon, unspecified shoulder: Secondary | ICD-10-CM | POA: Diagnosis present

## 2013-06-20 DIAGNOSIS — E118 Type 2 diabetes mellitus with unspecified complications: Secondary | ICD-10-CM | POA: Diagnosis present

## 2013-06-20 DIAGNOSIS — R079 Chest pain, unspecified: Secondary | ICD-10-CM

## 2013-06-20 DIAGNOSIS — K625 Hemorrhage of anus and rectum: Secondary | ICD-10-CM

## 2013-06-20 DIAGNOSIS — E861 Hypovolemia: Secondary | ICD-10-CM | POA: Diagnosis not present

## 2013-06-20 DIAGNOSIS — R531 Weakness: Secondary | ICD-10-CM

## 2013-06-20 DIAGNOSIS — I824Y9 Acute embolism and thrombosis of unspecified deep veins of unspecified proximal lower extremity: Secondary | ICD-10-CM | POA: Diagnosis present

## 2013-06-20 DIAGNOSIS — J479 Bronchiectasis, uncomplicated: Secondary | ICD-10-CM

## 2013-06-20 DIAGNOSIS — R410 Disorientation, unspecified: Secondary | ICD-10-CM

## 2013-06-20 DIAGNOSIS — E876 Hypokalemia: Secondary | ICD-10-CM

## 2013-06-20 DIAGNOSIS — E860 Dehydration: Secondary | ICD-10-CM

## 2013-06-20 DIAGNOSIS — E46 Unspecified protein-calorie malnutrition: Secondary | ICD-10-CM

## 2013-06-20 DIAGNOSIS — R1031 Right lower quadrant pain: Secondary | ICD-10-CM

## 2013-06-20 DIAGNOSIS — Z885 Allergy status to narcotic agent status: Secondary | ICD-10-CM

## 2013-06-20 DIAGNOSIS — T383X5A Adverse effect of insulin and oral hypoglycemic [antidiabetic] drugs, initial encounter: Secondary | ICD-10-CM | POA: Diagnosis present

## 2013-06-20 DIAGNOSIS — K922 Gastrointestinal hemorrhage, unspecified: Secondary | ICD-10-CM

## 2013-06-20 DIAGNOSIS — K219 Gastro-esophageal reflux disease without esophagitis: Secondary | ICD-10-CM

## 2013-06-20 DIAGNOSIS — F0150 Vascular dementia without behavioral disturbance: Secondary | ICD-10-CM | POA: Diagnosis present

## 2013-06-20 DIAGNOSIS — M549 Dorsalgia, unspecified: Secondary | ICD-10-CM

## 2013-06-20 DIAGNOSIS — L03039 Cellulitis of unspecified toe: Secondary | ICD-10-CM

## 2013-06-20 LAB — URINALYSIS, ROUTINE W REFLEX MICROSCOPIC
BILIRUBIN URINE: NEGATIVE
Glucose, UA: 500 mg/dL — AB
Hgb urine dipstick: NEGATIVE
Ketones, ur: NEGATIVE mg/dL
Leukocytes, UA: NEGATIVE
Nitrite: NEGATIVE
Protein, ur: 30 mg/dL — AB
Specific Gravity, Urine: 1.023 (ref 1.005–1.030)
UROBILINOGEN UA: 0.2 mg/dL (ref 0.0–1.0)
pH: 7.5 (ref 5.0–8.0)

## 2013-06-20 LAB — COMPREHENSIVE METABOLIC PANEL
ALBUMIN: 2.6 g/dL — AB (ref 3.5–5.2)
ALT: 14 U/L (ref 0–35)
AST: 21 U/L (ref 0–37)
Alkaline Phosphatase: 116 U/L (ref 39–117)
BUN: 17 mg/dL (ref 6–23)
CALCIUM: 10.2 mg/dL (ref 8.4–10.5)
CHLORIDE: 96 meq/L (ref 96–112)
CO2: 27 mEq/L (ref 19–32)
Creatinine, Ser: 1.05 mg/dL (ref 0.50–1.10)
GFR calc Af Amer: 60 mL/min — ABNORMAL LOW (ref >90)
GFR, EST NON AFRICAN AMERICAN: 52 mL/min — AB (ref >90)
Glucose, Bld: 139 mg/dL — ABNORMAL HIGH (ref 70–99)
Potassium: 3 mEq/L — ABNORMAL LOW (ref 3.7–5.3)
SODIUM: 140 meq/L (ref 137–147)
Total Protein: 7.5 g/dL (ref 6.0–8.3)

## 2013-06-20 LAB — CBC WITH DIFFERENTIAL/PLATELET
BASOS PCT: 0 % (ref 0–1)
Basophils Absolute: 0 10*3/uL (ref 0.0–0.1)
EOS ABS: 0.1 10*3/uL (ref 0.0–0.7)
Eosinophils Relative: 1 % (ref 0–5)
HCT: 41.2 % (ref 36.0–46.0)
HEMOGLOBIN: 12.9 g/dL (ref 12.0–15.0)
Lymphocytes Relative: 26 % (ref 12–46)
Lymphs Abs: 2.4 10*3/uL (ref 0.7–4.0)
MCH: 27.4 pg (ref 26.0–34.0)
MCHC: 31.3 g/dL (ref 30.0–36.0)
MCV: 87.5 fL (ref 78.0–100.0)
Monocytes Absolute: 0.9 10*3/uL (ref 0.1–1.0)
Monocytes Relative: 9 % (ref 3–12)
NEUTROS PCT: 64 % (ref 43–77)
Neutro Abs: 6 10*3/uL (ref 1.7–7.7)
Platelets: 299 10*3/uL (ref 150–400)
RBC: 4.71 MIL/uL (ref 3.87–5.11)
RDW: 14.9 % (ref 11.5–15.5)
WBC: 9.5 10*3/uL (ref 4.0–10.5)

## 2013-06-20 LAB — URINE MICROSCOPIC-ADD ON

## 2013-06-20 LAB — TROPONIN I: Troponin I: <0.3 ng/mL (ref <0.30)

## 2013-06-20 LAB — I-STAT CG4 LACTIC ACID, ED: Lactic Acid, Venous: 3.2 mmol/L — ABNORMAL HIGH (ref 0.5–2.2)

## 2013-06-20 LAB — PRO B NATRIURETIC PEPTIDE: PRO B NATRI PEPTIDE: 98.5 pg/mL (ref 0–125)

## 2013-06-20 MED ORDER — POTASSIUM CHLORIDE CRYS ER 20 MEQ PO TBCR
40.0000 meq | EXTENDED_RELEASE_TABLET | Freq: Once | ORAL | Status: AC
  Administered 2013-06-20: 40 meq via ORAL
  Filled 2013-06-20: qty 2

## 2013-06-20 MED ORDER — SODIUM CHLORIDE 0.9 % IV SOLN
INTRAVENOUS | Status: DC
  Administered 2013-06-20: 18:00:00 via INTRAVENOUS

## 2013-06-20 NOTE — ED Notes (Signed)
Pt. In xray 

## 2013-06-20 NOTE — ED Notes (Signed)
Lab at bedside

## 2013-06-20 NOTE — ED Notes (Signed)
Pt states she was just discharged from the hospital but doesn't know why. States since then shes felt achy all over her body, weak, and has no appetite. She reports poor oral intake. shes a&ox4, resp e/u

## 2013-06-20 NOTE — ED Notes (Signed)
Dr Freida BusmanAllen given a copy of lactic acid 3.20

## 2013-06-20 NOTE — Progress Notes (Signed)
Attempted to get report from ED nurse, stated to call back once available.  Sheila SanderMacrohon, Sheila Cisneros [address] [place] [phone]

## 2013-06-20 NOTE — ED Provider Notes (Signed)
CSN: [id number]     Arrival date & time 06/20/13  1738 History   First MD Initiated Contact with Patient 06/20/13 1802     Chief Complaint  Patient presents with  . Generalized Body Aches     (Consider location/radiation/quality/duration/timing/severity/associated sxs/prior Treatment) The history is provided by the patient and the spouse.   Patient here complaining of diffuse whole-body weakness x24 hours. Notes cough and congestion without chest pain or chest pressure. Patient may discharged the hospital 2 weeks ago after an admission for GI bleed. She continues to note dark stools which has been unchanged since her discharge. She did require a transfusion with packed red cells. She notes anorexia without vomiting. Denies any dysuria or hematuria. Symptoms have been persistent. No treatment used prior to arrival. No syncope or near-syncope. Past Medical History  Diagnosis Date  . Gout, unspecified   . Other chronic sinusitis   . Unspecified chronic bronchitis     sees Dr. Melba Coon, last visit 08/2011, treated for E. Coli- resp. /w Ceftin, Feb. 2013  . Hypertension   . GERD (gastroesophageal reflux disease)     rolaids if needed  . COPD (chronic obstructive pulmonary disease)   . Pneumonia     "couple times; long time ago" (12/09/2012)  . Chronic bronchitis     "couple times/yr" (12/09/2012)  . Exertional shortness of breath   . History of blood transfusion     "related to a surgery, I think" (12/09/2012)  . Hepatitis, unspecified     "the one that's not bad" (12/09/2012)  . Migraines     "in the past" (12/09/2012)  . Osteoarthrosis, unspecified whether generalized or localized, unspecified site   . Arthritis     "all over" (12/09/2012)  . Chronic lower back pain   . Altered mental status 12/09/2012  . Diabetes mellitus without complication    Past Surgical History  Procedure Laterality Date  . Tubal ligation    . Tonsillectomy    . Shoulder arthroscopy w/ rotator cuff repair  Right   . Carpal tunnel release  09/06/2011    Procedure: CARPAL TUNNEL RELEASE;  Surgeon: Kennieth Rad, MD;  Location: [facility] OR;  Service: Orthopedics;  Laterality: Right;  . Abdominal hysterectomy      "w/right ovary" (12/09/2012)  . Appendectomy    . Breast biopsy Right   . Esophagogastroduodenoscopy N/A 06/04/2013    Procedure: ESOPHAGOGASTRODUODENOSCOPY (EGD);  Surgeon: Graylin Shiver, MD;  Location: [facility] ENDOSCOPY;  Service: Endoscopy;  Laterality: N/A;   Family History  Problem Relation Age of Onset  . Heart disease Father   . ALS Brother    History  Substance Use Topics  . Smoking status: Never Smoker   . Smokeless tobacco: Never Used  . Alcohol Use: Yes     Comment: 12/09/2012 "have a taste yearly"   OB History   Grav Para Term Preterm Abortions TAB SAB Ect Mult Living                 Review of Systems  All other systems reviewed and are negative.      Allergies  Codeine; Penicillins; Tetanus toxoids; Neomycin; and Levofloxacin  Home Medications   Current Outpatient Rx  Name  Route  Sig  Dispense  Refill  . acetaminophen (TYLENOL) 500 MG tablet   Oral   Take 500 mg by mouth every 6 (six) hours as needed. For pain         . albuterol (PROAIR HFA) 108 (90  BASE) MCG/ACT inhaler   Inhalation   Inhale 2 puffs into the lungs every 6 (six) hours as needed for wheezing or shortness of breath.   1 Inhaler   2   . albuterol (PROVENTIL) (5 MG/ML) 0.5% nebulizer solution   Nebulization   Take 0.5 mLs (2.5 mg total) by nebulization every 2 (two) hours as needed for wheezing or shortness of breath.   20 mL   12   . Ascorbic Acid (VITAMIN C) 500 MG tablet   Oral   Take 500 mg by mouth daily.           . benzonatate (TESSALON) 100 MG capsule   Oral   Take 100 mg by mouth 2 (two) times daily.         . chlorpheniramine-HYDROcodone (TUSSIONEX) 10-8 MG/5ML LQCR   Oral   Take 5 mLs by mouth every 12 (twelve) hours as needed.   180 mL   0   . cholecalciferol  (VITAMIN D) 1000 UNITS tablet   Oral   Take 1,000 Units by mouth daily.         Marland Kitchen. donepezil (ARICEPT) 5 MG tablet   Oral   Take 5 mg by mouth at bedtime.         . fexofenadine-pseudoephedrine (ALLEGRA-D 12 HOUR) 60-120 MG per tablet   Oral   Take 1 tablet by mouth 2 (two) times daily.           . fish oil-omega-3 fatty acids 1000 MG capsule   Oral   Take 2 g by mouth daily.         . furosemide (LASIX) 40 MG tablet   Oral   Take 0.5 tablets (20 mg total) by mouth daily.   30 tablet   0   . IRON PO   Oral   Take 1 tablet by mouth daily.         Marland Kitchen. levofloxacin (LEVAQUIN) 500 MG tablet   Oral   Take 1 tablet (500 mg total) by mouth daily.   3 tablet   0   . Multiple Vitamin (MULTIVITAMIN) capsule   Oral   Take 1 capsule by mouth daily.           Marland Kitchen. oxyCODONE-acetaminophen (PERCOCET/ROXICET) 5-325 MG per tablet   Oral   Take 1 tablet by mouth every 4 (four) hours as needed for severe pain.   20 tablet   0   . potassium chloride SA (K-DUR,KLOR-CON) 20 MEQ tablet   Oral   Take 20 mEq by mouth 2 (two) times daily.           . simvastatin (ZOCOR) 20 MG tablet   Oral   Take 20 mg by mouth at bedtime.           . SitaGLIPtin-MetFORMIN HCl (JANUMET PO)   Oral   Take 1 tablet by mouth every evening.         . [place] Johns Wort 300 MG CAPS   Oral   Take 300 mg by mouth daily.         . traMADol (ULTRAM) 50 MG tablet   Oral   Take 2 tablets (100 mg total) by mouth 3 (three) times daily.   180 tablet   0   . venlafaxine (EFFEXOR) 37.5 MG tablet   Oral   Take 37.5 mg by mouth daily as needed (hot flashes).         . verapamil (COVERA HS) 240 MG (CO) 24 hr  tablet   Oral   Take 240 mg by mouth at bedtime.            BP 134/71  Pulse 123  Temp(Src) 97.9 F (36.6 C) (Oral)  Resp 18  Ht 5\' 2"  (1.575 m)  Wt 115 lb (52.164 kg)  BMI 21.03 kg/m2  SpO2 98% Physical Exam  Nursing note and vitals reviewed. Constitutional: She is oriented to  person, place, and time. She appears well-developed and well-nourished.  Non-toxic appearance. No distress.  HENT:  Head: Normocephalic and atraumatic.  Eyes: Conjunctivae, EOM and lids are normal. Pupils are equal, round, and reactive to light.  Neck: Normal range of motion. Neck supple. No tracheal deviation present. No mass present.  Cardiovascular: Regular rhythm and normal heart sounds.  Tachycardia present.  Exam reveals no gallop.   No murmur heard. Pulmonary/Chest: Effort normal and breath sounds normal. No stridor. No respiratory distress. She has no decreased breath sounds. She has no wheezes. She has no rhonchi. She has no rales.  Abdominal: Soft. Normal appearance and bowel sounds are normal. She exhibits no distension. There is no tenderness. There is no rebound and no CVA tenderness.  Musculoskeletal: Normal range of motion. She exhibits no edema and no tenderness.  Neurological: She is alert and oriented to person, place, and time. She has normal strength. No cranial nerve deficit or sensory deficit. GCS eye subscore is 4. GCS verbal subscore is 5. GCS motor subscore is 6.  Skin: Skin is warm and dry. No abrasion and no rash noted.  Psychiatric: Her speech is normal and behavior is normal. Her affect is blunt.    ED Course  Procedures (including critical care time) Labs Review Labs Reviewed  COMPREHENSIVE METABOLIC PANEL  CBC WITH DIFFERENTIAL  URINALYSIS, ROUTINE W REFLEX MICROSCOPIC  PRO B NATRIURETIC PEPTIDE  TROPONIN I  I-STAT CG4 LACTIC ACID, ED   Imaging Review No results found.   EKG Interpretation   Date/Time:  Saturday June 20 2013 18:26:26 EST Ventricular Rate:  115 PR Interval:  54 QRS Duration: 79 QT Interval:  313 QTC Calculation: 433 R Axis:   35 Text Interpretation:  Age not entered, assumed to be  73 years old for  purpose of ECG interpretation Sinus tachycardia Low voltage, precordial  leads Confirmed by Hiroto Saltzman  MD, Noland Pizano ([id number]) on 06/20/2013  7:21:02 PM      MDM   Final diagnoses:  None   Patient given IV fluids here and also potassium supplementation. On reexam patient remains weak he'll be evaluated by the hospitalist.     Toy Baker, MD 06/20/13 2330

## 2013-06-20 NOTE — ED Notes (Signed)
Pt. Unable to use bathroom. Will do in and out. Patient agreeable.

## 2013-06-21 ENCOUNTER — Encounter (HOSPITAL_COMMUNITY): Payer: Self-pay | Admitting: General Practice

## 2013-06-21 ENCOUNTER — Inpatient Hospital Stay (HOSPITAL_COMMUNITY): Payer: Medicare Other

## 2013-06-21 DIAGNOSIS — J479 Bronchiectasis, uncomplicated: Secondary | ICD-10-CM

## 2013-06-21 DIAGNOSIS — E872 Acidosis, unspecified: Secondary | ICD-10-CM

## 2013-06-21 DIAGNOSIS — E875 Hyperkalemia: Secondary | ICD-10-CM

## 2013-06-21 DIAGNOSIS — R5381 Other malaise: Secondary | ICD-10-CM

## 2013-06-21 DIAGNOSIS — D72829 Elevated white blood cell count, unspecified: Secondary | ICD-10-CM

## 2013-06-21 DIAGNOSIS — R531 Weakness: Secondary | ICD-10-CM | POA: Diagnosis present

## 2013-06-21 DIAGNOSIS — E118 Type 2 diabetes mellitus with unspecified complications: Secondary | ICD-10-CM | POA: Diagnosis present

## 2013-06-21 DIAGNOSIS — R079 Chest pain, unspecified: Secondary | ICD-10-CM

## 2013-06-21 DIAGNOSIS — E46 Unspecified protein-calorie malnutrition: Secondary | ICD-10-CM

## 2013-06-21 DIAGNOSIS — M79609 Pain in unspecified limb: Secondary | ICD-10-CM

## 2013-06-21 DIAGNOSIS — E876 Hypokalemia: Secondary | ICD-10-CM

## 2013-06-21 DIAGNOSIS — I10 Essential (primary) hypertension: Secondary | ICD-10-CM

## 2013-06-21 DIAGNOSIS — M7989 Other specified soft tissue disorders: Secondary | ICD-10-CM

## 2013-06-21 DIAGNOSIS — R5383 Other fatigue: Secondary | ICD-10-CM

## 2013-06-21 LAB — BASIC METABOLIC PANEL
BUN: 12 mg/dL (ref 6–23)
CHLORIDE: 99 meq/L (ref 96–112)
CO2: 23 mEq/L (ref 19–32)
Calcium: 9.2 mg/dL (ref 8.4–10.5)
Creatinine, Ser: 0.82 mg/dL (ref 0.50–1.10)
GFR, EST AFRICAN AMERICAN: 81 mL/min — AB (ref >90)
GFR, EST NON AFRICAN AMERICAN: 70 mL/min — AB (ref >90)
Glucose, Bld: 110 mg/dL — ABNORMAL HIGH (ref 70–99)
Potassium: 3 mEq/L — ABNORMAL LOW (ref 3.7–5.3)
Sodium: 137 mEq/L (ref 137–147)

## 2013-06-21 LAB — GLUCOSE, CAPILLARY
GLUCOSE-CAPILLARY: 108 mg/dL — AB (ref 70–99)
GLUCOSE-CAPILLARY: 144 mg/dL — AB (ref 70–99)
Glucose-Capillary: 99 mg/dL (ref 70–99)
Glucose-Capillary: 139 mg/dL — ABNORMAL HIGH (ref 70–99)

## 2013-06-21 LAB — HEMOGLOBIN A1C
Hgb A1c MFr Bld: 5.7 % — ABNORMAL HIGH (ref <5.7)
Mean Plasma Glucose: 117 mg/dL — ABNORMAL HIGH (ref <117)

## 2013-06-21 LAB — CBC
HEMATOCRIT: 33.1 % — AB (ref 36.0–46.0)
Hemoglobin: 11 g/dL — ABNORMAL LOW (ref 12.0–15.0)
MCH: 28.6 pg (ref 26.0–34.0)
MCHC: 33.2 g/dL (ref 30.0–36.0)
MCV: 86.2 fL (ref 78.0–100.0)
Platelets: 303 10*3/uL (ref 150–400)
RBC: 3.84 MIL/uL — ABNORMAL LOW (ref 3.87–5.11)
RDW: 15.1 % (ref 11.5–15.5)
WBC: 9.8 10*3/uL (ref 4.0–10.5)

## 2013-06-21 LAB — TROPONIN I: Troponin I: <0.3 ng/mL (ref <0.30)

## 2013-06-21 LAB — CALCIUM, IONIZED: CALCIUM ION: 1.29 mmol/L (ref 1.13–1.30)

## 2013-06-21 LAB — CK: Total CK: 49 U/L (ref 7–177)

## 2013-06-21 LAB — VITAMIN B12: Vitamin B-12: >2000 pg/mL — ABNORMAL HIGH (ref 211–911)

## 2013-06-21 LAB — TSH: TSH: 2.063 u[IU]/mL (ref 0.350–4.500)

## 2013-06-21 MED ORDER — VERAPAMIL HCL 240 MG (CO) PO TB24: 240.0000 mg | ORAL_TABLET | Freq: Every day | ORAL | Status: DC

## 2013-06-21 MED ORDER — ENOXAPARIN SODIUM 40 MG/0.4ML ~~LOC~~ SOLN
40.0000 mg | SUBCUTANEOUS | Status: DC
  Administered 2013-06-22 – 2013-06-23 (×2): 40 mg via SUBCUTANEOUS
  Filled 2013-06-21 (×2): qty 0.4

## 2013-06-21 MED ORDER — ONDANSETRON HCL 4 MG/2ML IJ SOLN: 4.0000 mg | Freq: Four times a day (QID) | INTRAMUSCULAR | Status: DC | PRN

## 2013-06-21 MED ORDER — LORATADINE 10 MG PO TABS
10.0000 mg | ORAL_TABLET | Freq: Every day | ORAL | Status: DC
  Administered 2013-06-21 – 2013-06-23 (×3): 10 mg via ORAL
  Filled 2013-06-21 (×3): qty 1

## 2013-06-21 MED ORDER — SODIUM CHLORIDE 0.9 % IJ SOLN: 3.0000 mL | INTRAMUSCULAR | Status: DC | PRN

## 2013-06-21 MED ORDER — ACETAMINOPHEN 650 MG RE SUPP: 650.0000 mg | Freq: Four times a day (QID) | RECTAL | Status: DC | PRN

## 2013-06-21 MED ORDER — VENLAFAXINE HCL 37.5 MG PO TABS
37.5000 mg | ORAL_TABLET | Freq: Every day | ORAL | Status: DC | PRN
  Filled 2013-06-21: qty 1

## 2013-06-21 MED ORDER — BENZONATATE 100 MG PO CAPS
100.0000 mg | ORAL_CAPSULE | Freq: Two times a day (BID) | ORAL | Status: DC
  Administered 2013-06-21 – 2013-06-23 (×5): 100 mg via ORAL
  Filled 2013-06-21 (×6): qty 1

## 2013-06-21 MED ORDER — INSULIN ASPART 100 UNIT/ML ~~LOC~~ SOLN: 0.0000 [IU] | Freq: Every day | SUBCUTANEOUS | Status: DC

## 2013-06-21 MED ORDER — ATORVASTATIN CALCIUM 10 MG PO TABS
10.0000 mg | ORAL_TABLET | Freq: Every day | ORAL | Status: DC
  Administered 2013-06-21 – 2013-06-22 (×3): 10 mg via ORAL
  Filled 2013-06-21 (×4): qty 1

## 2013-06-21 MED ORDER — OXYCODONE HCL 5 MG PO TABS
5.0000 mg | ORAL_TABLET | ORAL | Status: DC | PRN
  Administered 2013-06-21 – 2013-06-23 (×4): 5 mg via ORAL
  Filled 2013-06-21 (×5): qty 1

## 2013-06-21 MED ORDER — ALBUTEROL SULFATE (2.5 MG/3ML) 0.083% IN NEBU
2.5000 mg | INHALATION_SOLUTION | RESPIRATORY_TRACT | Status: DC | PRN
  Administered 2013-06-21 – 2013-06-23 (×3): 2.5 mg via RESPIRATORY_TRACT
  Filled 2013-06-21 (×3): qty 3

## 2013-06-21 MED ORDER — HYDROMORPHONE HCL PF 1 MG/ML IJ SOLN
0.5000 mg | INTRAMUSCULAR | Status: DC | PRN
  Administered 2013-06-21 – 2013-06-23 (×6): 1 mg via INTRAVENOUS
  Filled 2013-06-21 (×6): qty 1

## 2013-06-21 MED ORDER — VITAMIN D3 25 MCG (1000 UNIT) PO TABS
1000.0000 [IU] | ORAL_TABLET | Freq: Every day | ORAL | Status: DC
  Administered 2013-06-21 – 2013-06-23 (×3): 1000 [IU] via ORAL
  Filled 2013-06-21 (×3): qty 1

## 2013-06-21 MED ORDER — ONDANSETRON HCL 4 MG PO TABS: 4.0000 mg | ORAL_TABLET | Freq: Four times a day (QID) | ORAL | Status: DC | PRN

## 2013-06-21 MED ORDER — ENOXAPARIN SODIUM 30 MG/0.3ML ~~LOC~~ SOLN
30.0000 mg | SUBCUTANEOUS | Status: DC
  Administered 2013-06-21: 30 mg via SUBCUTANEOUS
  Filled 2013-06-21: qty 0.3

## 2013-06-21 MED ORDER — ADULT MULTIVITAMIN W/MINERALS CH
1.0000 | ORAL_TABLET | Freq: Every day | ORAL | Status: DC
  Administered 2013-06-21 – 2013-06-23 (×3): 1 via ORAL
  Filled 2013-06-21 (×3): qty 1

## 2013-06-21 MED ORDER — DONEPEZIL HCL 5 MG PO TABS
5.0000 mg | ORAL_TABLET | Freq: Every day | ORAL | Status: DC
  Administered 2013-06-21 – 2013-06-22 (×2): 5 mg via ORAL
  Filled 2013-06-21 (×3): qty 1

## 2013-06-21 MED ORDER — SODIUM CHLORIDE 0.9 % IJ SOLN
3.0000 mL | Freq: Two times a day (BID) | INTRAMUSCULAR | Status: DC
  Administered 2013-06-21: 3 mL via INTRAVENOUS

## 2013-06-21 MED ORDER — MULTIVITAMINS PO CAPS: 1.0000 | ORAL_CAPSULE | Freq: Every day | ORAL | Status: DC

## 2013-06-21 MED ORDER — INSULIN ASPART 100 UNIT/ML ~~LOC~~ SOLN
0.0000 [IU] | Freq: Three times a day (TID) | SUBCUTANEOUS | Status: DC
  Administered 2013-06-21: 1 [IU] via SUBCUTANEOUS

## 2013-06-21 MED ORDER — SODIUM CHLORIDE 0.9 % IV SOLN: 250.0000 mL | INTRAVENOUS | Status: DC | PRN

## 2013-06-21 MED ORDER — SIMVASTATIN 20 MG PO TABS
20.0000 mg | ORAL_TABLET | Freq: Every day | ORAL | Status: DC
  Filled 2013-06-21: qty 1

## 2013-06-21 MED ORDER — ALUM & MAG HYDROXIDE-SIMETH 200-200-20 MG/5ML PO SUSP: 30.0000 mL | Freq: Four times a day (QID) | ORAL | Status: DC | PRN

## 2013-06-21 MED ORDER — VERAPAMIL HCL ER 240 MG PO TBCR
240.0000 mg | EXTENDED_RELEASE_TABLET | Freq: Every day | ORAL | Status: DC
  Administered 2013-06-21 – 2013-06-22 (×2): 240 mg via ORAL
  Filled 2013-06-21 (×3): qty 1

## 2013-06-21 MED ORDER — SODIUM CHLORIDE 0.9 % IV SOLN
INTRAVENOUS | Status: DC
Start: 2013-06-21 — End: 2013-06-22
  Administered 2013-06-21: 1000 mL via INTRAVENOUS

## 2013-06-21 MED ORDER — POTASSIUM CHLORIDE CRYS ER 20 MEQ PO TBCR
20.0000 meq | EXTENDED_RELEASE_TABLET | Freq: Two times a day (BID) | ORAL | Status: DC
  Administered 2013-06-21 – 2013-06-22 (×4): 20 meq via ORAL
  Filled 2013-06-21 (×7): qty 1

## 2013-06-21 MED ORDER — ACETAMINOPHEN 325 MG PO TABS: 650.0000 mg | ORAL_TABLET | Freq: Four times a day (QID) | ORAL | Status: DC | PRN

## 2013-06-21 NOTE — Evaluation (Signed)
Physical Therapy Evaluation Patient Details Name: Sheila Cisneros MRN: [id number] DOB: [birth year] Today's Date: 06/21/2013 Time: 4098-1191 PT Time Calculation (min): 33 min  PT Assessment / Plan / Recommendation History of Present Illness  Sheila Cisneros is a 73 y.o. female with Multiple Medical Problems  Who was brought to the ED due to complaints of weakness and body aches worsening over the past few days.  She was not able to walk during the past 24 hours and her husband had her brought to the ED  For evalauation.   Clinical Impression  Patient presents with problems listed below.  Will benefit from acute PT to maximize mobility prior to discharge.  Recommend SNF at discharge for continued therapy.    PT Assessment  Patient needs continued PT services    Follow Up Recommendations  SNF    Does the patient have the potential to tolerate intense rehabilitation      Barriers to Discharge Decreased caregiver support Patient's husband works.  Patient is home alone for part of the day.    Equipment Recommendations  None recommended by PT    Recommendations for Other Services     Frequency Min 2X/week    Precautions / Restrictions Precautions Precautions: Fall Restrictions Weight Bearing Restrictions: No   Pertinent Vitals/Pain       Mobility  Bed Mobility Overal bed mobility: Needs Assistance Bed Mobility: Supine to Sit;Sit to Supine Supine to sit: Mod assist Sit to supine: Min assist General bed mobility comments: Verbal cues for technique.  Assist to raise trunk to sitting position.  Once upright, patient able to maintain balance, with flexed posture.  Keeps eyes closed - will open to command and then shuts them again.  Assist to bring LE's onto bed to return to supine.  Max assist to scoot patient away from edge of bed in sidelying - used bed pad. Transfers Overall transfer level: Needs assistance Equipment used: 1 person hand held assist Transfers: Sit to/from Stand Sit  to Stand: Mod assist General transfer comment: Verbal cues for hand placement.  Assist to rise to standing and for balance.  Performed x2 for strengthening and activity tolerance. Patient stood x 60 seconds each time.    Exercises     PT Diagnosis: Difficulty walking;Generalized weakness;Altered mental status  PT Problem List: Decreased strength;Decreased activity tolerance;Decreased balance;Decreased mobility;Decreased cognition;Decreased knowledge of use of DME;Decreased safety awareness PT Treatment Interventions: DME instruction;Gait training;Functional mobility training;Balance training;Cognitive remediation;Patient/family education     PT Goals(Current goals can be found in the care plan section) Acute Rehab PT Goals Patient Stated Goal: To walk PT Goal Formulation: With patient Time For Goal Achievement: 07/05/13 Potential to Achieve Goals: Good  Visit Information  Last PT Received On: 06/21/13 Assistance Needed: +1 History of Present Illness: Sheila Cisneros is a 73 y.o. female with Multiple Medical Problems  Who was brought to the ED due to complaints of weakness and body aches worsening over the past few days.  She was not able to walk during the past 24 hours and her husband had her brought to the ED  For evalauation.        Prior Functioning  Home Living Family/patient expects to be discharged to:: Skilled nursing facility Living Arrangements: Spouse/significant other Available Help at Discharge: Family;Available PRN/intermittently (Per patient, husband works) Charity fundraiser: Environmental consultant - 2 wheels;Shower seat;Cane - single point;Bedside commode;Wheelchair - manual Prior Function Level of Independence: Independent with assistive device(s) Comments: Uses cane Communication Communication: No difficulties  Cognition  Cognition Arousal/Alertness: Awake/alert Behavior During Therapy: Flat affect;Anxious Overall Cognitive Status: Impaired/Different from baseline Area of  Impairment: Orientation;Attention;Following commands;Safety/judgement;Problem solving Orientation Level: Disoriented to;Time;Situation Current Attention Level: Selective Memory: Decreased short-term memory Following Commands: Follows one step commands with increased time Safety/Judgement: Decreased awareness of safety Problem Solving: Slow processing;Decreased initiation;Difficulty sequencing;Requires verbal cues General Comments: Patient with very slow response time to questions and for movement.    Extremity/Trunk Assessment Upper Extremity Assessment Upper Extremity Assessment: Generalized weakness Lower Extremity Assessment Lower Extremity Assessment: Generalized weakness   Balance Balance Overall balance assessment: Needs assistance Sitting-balance support: Single extremity supported;Feet supported Sitting balance-Leahy Scale: Fair Standing balance support: Bilateral upper extremity supported Standing balance-Leahy Scale: Poor  End of Session PT - End of Session Equipment Utilized During Treatment: Gait belt Activity Tolerance: Patient limited by fatigue Patient left: in bed;with call bell/phone within reach;with bed alarm set Nurse Communication: Mobility status  GP     Vena Cisneros, Sheila Snooks H 06/21/2013, 5:58 PM Durenda HurtSusan H. Sheila Cisneros, PT, [facility] CenterMBA Acute Rehab Services Pager [phone]

## 2013-06-21 NOTE — Progress Notes (Signed)
VASCULAR LAB PRELIMINARY  PRELIMINARY  PRELIMINARY  PRELIMINARY  Bilateral lower extremity venous duplex  completed.    Preliminary report:  Right:  Non occlusiveDVT of indeterminate age noted in the tibial peroneal trunk .  No evidence of superficial thrombosis.  No Baker's cyst.  Left:  Non occlusive DVT of indeterminate age noted in the popliteal vein.  No evidence of superficial thrombosis.  No Baker's cyst.   Fonda Rochon, RVT 06/21/2013, 3:17 PM

## 2013-06-21 NOTE — Progress Notes (Signed)
[facility] PROGRESS NOTE  Sheila Cisneros ZOX:[id number] DOB: 12/19/1940 DOA: 06/20/2013 PCP: Alva Garnet., MD  Assessment/Plan: Hypercalcemia -Likely due to immobility -Corrected calcium 11.3 -Improved with IV fluids -Check ionized calcium -If remains elevated, check intact PTH Hypokalemia -Replete -Check magnesium Generalize weakness/generalized pain -Multifactorial including electrolyte derangement, deconditioning -PT evaluation -Check TSH -Serum B12 -rbc folate -Urinalysis negative for any pyuria Chest pain -EKG without ST-T wave changes -Serial troponins -echo given pt's generalized weakness and sob Low back pain with history of spinal stenosis -MR lumbar spine, cervical spine -PT evaluation Diabetes mellitus type 2 -Check hemoglobin A1c -Continue NovoLog sliding scale Dementia, Alzheimer's type -Continue Aricept Lactic acidosis -likely due to hypovolemia -trend Lower extremity pain and edema -Venous duplex on DVT Hypertension -Continue verapamil    Family Communication:  Discussed with husband on phone--total time , greater than 50% counseling patient and coordinating Disposition Plan:   SNF       Procedures/Studies: Dg Chest 2 View  06/20/2013   CLINICAL DATA:  Cough.  Pain.  EXAM: CHEST  2 VIEW  COMPARISON:  12/09/2012  FINDINGS: Cardiac silhouette is normal in size. Normal mediastinal and hilar contours  Mild medial left lung base opacity, likely atelectasis. No convincing infiltrate. No edema. No pleural effusion or pneumothorax.  Bony thorax is demineralized but grossly intact.  IMPRESSION: No acute cardiopulmonary disease.   Electronically Signed   By: Amie Portland M.D.   On: 06/20/2013 19:15   Dg Shoulder Right  06/15/2013   CLINICAL DATA:  Proximal humeral pain.  Limited range of motion.  EXAM: RIGHT SHOULDER - 2+ VIEW  COMPARISON:  DG FINGER INDEX 2+V*R* dated 09/16/2012; MR EXTREM UP JT*R* W/O CM dated 09/29/2004; US VENOUS IMAGING  UNILATERAL*R* dated 01/05/2005  FINDINGS: Degenerative glenohumeral arthropathy noted with spurring of the acromioclavicular joint. No dislocation or acute fracture.  IMPRESSION: 1. No acute bony findings. 2. Degenerative glenohumeral arthropathy. Degenerative AC joint arthropathy.   Electronically Signed   By: Herbie Baltimore M.D.   On: 06/15/2013 16:26   Ct Abdomen Pelvis W Contrast  06/04/2013   CLINICAL DATA:  Pain.  EXAM: CT ABDOMEN AND PELVIS WITH CONTRAST  TECHNIQUE: Multidetector CT imaging of the abdomen and pelvis was performed using the standard protocol following bolus administration of intravenous contrast.  CONTRAST:  80mL OMNIPAQUE IOHEXOL 300 MG/ML  SOLN  COMPARISON:  CT ABD/PELVIS W CM dated 10/08/2011  FINDINGS: Included view of the lung bases demonstrates patchy consolidation in the left lung base with bronchiectasis and scarring. The heart and pericardium are unremarkable.  Moderate hiatal hernia. Remainder of the stomach is unremarkable. Small and large bowel are normal in course and caliber without inflammatory changes. No gastric wall thickening on today's examination. Contrast as yet to reach the large bowel.  14 mm cyst in the segment 7 of the liver, unchanged. The liver is otherwise unremarkable. The gallbladder, spleen, adrenal glands are nonsuspicious. Atrophic pancreas is unchanged.  No intraperitoneal free fluid nor free air. 2 mm cortical based right renal interpolar calcification. Additional too small to characterize hypodensities in the right kidney, the kidneys are otherwise unremarkable. Delayed imaging demonstrates prompt symmetric excretion into the proximal urinary collecting systems. Aortoiliac vessels are normal in course and caliber with mild calcific atherosclerosis. Status post hysterectomy. Urinary bladder is partially distended unremarkable.  Soft tissues are unremarkable. Grade 2 L4-5 anterolisthesis without pars interarticularis defects. Moderate L4-5 canal stenosis  and moderate to severe neural foraminal narrowing at this level. Moderate to severe L3-4  neural foraminal narrowing. Small fat containing umbilical hernia.  IMPRESSION: Patchy consolidation in the left lung base concerning for pneumonia, in a background of bronchiectasis and scarring.  No acute intra-abdominal or pelvic process. No gastric wall thickening on today's examination.  Moderate hiatal hernia.   Electronically Signed   By: Awilda Metroourtnay  Bloomer   On: 06/04/2013 01:45         Subjective: Patient states "I hurt from my head to my toes". Denies nausea, vomiting, diarrhea, dysuria. C/o cp, sob which have improved.  No HA or visual changes.  No cough or hemoptysis.  No hx of hematochezia or melena  Objective: Filed Vitals:   06/21/13 0107 06/21/13 0327 06/21/13 0412 06/21/13 0918  BP: 145/77  140/76 141/67  Pulse: 91  97 84  Temp: 98.1 F (36.7 C)  98.3 F (36.8 C) 98.2 F (36.8 C)  TempSrc: Oral  Oral Oral  Resp: 20  20 20   Height: 5\' 2"  (1.575 m)     Weight: 55.611 kg (122 lb 9.6 oz)     SpO2: 99% 94% 98% 98%    Intake/Output Summary (Last 24 hours) at 06/21/13 0930 Last data filed at 06/21/13 [id number]  Gross per 24 hour  Intake    843 ml  Output      0 ml  Net    843 ml   Weight change:  Exam:   General:  Pt is alert, follows commands appropriately, not in acute distress  HEENT: No icterus, No thrush,  [place]/AT  Cardiovascular: RRR, S1/S2, no rubs, no gallops  Respiratory: Bibasilar rales. No wheezing. Good air movement.  Abdomen: Soft/+BS, non tender, non distended, no guarding  Extremities: 1+ edema, No lymphangitis, No petechiae, No rashes, no synovitis; negative straight leg raise test  Data Reviewed: Basic Metabolic Panel:  Recent Labs Lab 06/20/13 1929 06/21/13 0520  NA 140 137  K 3.0* 3.0*  CL 96 99  CO2 27 23  GLUCOSE 139* 110*  BUN 17 12  CREATININE 1.05 0.82  CALCIUM 10.2 9.2   Liver Function Tests:  Recent Labs Lab 06/20/13 1929  AST 21   ALT 14  ALKPHOS 116  BILITOT <0.2*  PROT 7.5  ALBUMIN 2.6*   No results found for this basename: LIPASE, AMYLASE,  in the last 168 hours No results found for this basename: AMMONIA,  in the last 168 hours CBC:  Recent Labs Lab 06/20/13 1929 06/21/13 0520  WBC 9.5 9.8  NEUTROABS 6.0  --   HGB 12.9 11.0*  HCT 41.2 33.1*  MCV 87.5 86.2  PLT 299 303   Cardiac Enzymes:  Recent Labs Lab 06/20/13 1929  TROPONINI <0.30   BNP: No components found with this basename: POCBNP,  CBG:  Recent Labs Lab 06/21/13 0046 06/21/13 0633  GLUCAP 108* 99    No results found for this or any previous visit (from the past 240 hour(s)).   Scheduled Meds: . benzonatate  100 mg Oral BID  . cholecalciferol  1,000 Units Oral Daily  . donepezil  5 mg Oral QHS  . enoxaparin (LOVENOX) injection  30 mg Subcutaneous Q24H  . insulin aspart  0-5 Units Subcutaneous QHS  . insulin aspart  0-9 Units Subcutaneous TID WC  . loratadine  10 mg Oral Daily  . multivitamin with minerals  1 tablet Oral Daily  . potassium chloride SA  20 mEq Oral BID  . simvastatin  20 mg Oral QHS  . verapamil  240 mg Oral QHS   Continuous  Infusions: . sodium chloride       Dametrius Sanjuan, DO  [facility] Pager [phone]  If 7PM-7AM, please contact night-coverage [email or web address] Password [email or web address] 06/21/2013, 9:30 AM   LOS: 1 day

## 2013-06-21 NOTE — Progress Notes (Signed)
New Admission Note:   Arrival Method: per stretcher from ED with ED tech TIM Mental Orientation: alert and oriented x4 Telemetry: none Assessment: Completed Skin: dry and flaky particularly in the lower extremities IV: Right hand, patent with clean, dry and intact dressing Pain: generalized weakness and body aches Tubes: none present Safety Measures: Safety Fall Prevention Plan has been given, discussed and signed Admission: Completed [address] Orientation: Patient has been orientated to the room, unit and staff. Safety video was offered but patient said she's too tired and wanted to rest. Family: no family member was around during admission  Orders have been reviewed and implemented. Will continue to monitor the patient. Call light has been placed within reach and bed alarm has been activated.   Sheila NorrieAnessa Diedra Cisneros BSN, RN [facility] [address] [place] [phone]

## 2013-06-21 NOTE — H&P (Signed)
[facility] History and Physical  Sheila Cisneros ZOX:[id number] DOB: [birth year] DOA: 06/20/2013  Referring physician:  EDP PCP: Alva Garnet., MD  Specialists:   Chief Complaint:  Weakness  HPI: Sheila Cisneros is a 73 y.o. female with Multiple Medical Problems  Who was brought to the ED due to complaints of weakness and body aches worsening over the past few days.  She was not able to walk during the past 24 hours and her husband had her brought to the ED  For evalauation.  She denies having any chest pain headache or fevers or ABD pain or nausea or vomiting or diarrhea.  In the ED, she was found to have an elevated lactic acid level of 3.2 and mild hypokalemia at 3.0, she was referred for medical admission.      Review of Systems:  Constitutional: No Weight Loss, Night Sweats, Fevers, Chills, Fatigue, or +Generalized Weakness HEENT: No Headaches, Difficulty Swallowing,Tooth/Dental Problems,Sore Throat,  No Sneezing, Rhinitis, Ear Ache, Nasal Congestion, or Post Nasal Drip,  Cardio-vascular:  No Chest pain, Orthopnea, PND, Edema in lower extremities, Anasarca, Dizziness, Palpitations  Resp: No Dyspnea, No DOE, No Productive Cough, No Non-Productive Cough, No Hemoptysis, No Change in Color of Mucus,  No Wheezing.    GI: No Heartburn, Indigestion, Abdominal Pain, Nausea, Vomiting, Diarrhea, Change in Bowel Habits,  Loss of Appetite  GU: No Dysuria, Change in Color of Urine, No Urgency or Frequency.  No flank pain.  Musculoskeletal: No Joint Pain or Swelling.  No Decreased Range of Motion. No Back Pain.  Neurologic: No Syncope, No Seizures, +Muscle Weakness, Paresthesia, Vision Disturbance or Loss, No Diplopia, No Vertigo, +Difficulty Walking,  Skin: No Rash or Lesions. Psych: No Change in Mood or Affect. No Depression or Anxiety. No Memory loss. No Confusion or Hallucinations   Past Medical History  Diagnosis Date  . Gout, unspecified   . Other chronic sinusitis   . Unspecified  chronic bronchitis     sees Dr. Melba Coon, last visit 08/2011, treated for E. Coli- resp. /w Ceftin, Feb. 2013  . Hypertension   . GERD (gastroesophageal reflux disease)     rolaids if needed  . COPD (chronic obstructive pulmonary disease)   . Pneumonia     "couple times; long time ago" (12/09/2012)  . Chronic bronchitis     "couple times/yr" (12/09/2012)  . Exertional shortness of breath   . History of blood transfusion     "related to a surgery, I think" (12/09/2012)  . Hepatitis, unspecified     "the one that's not bad" (12/09/2012)  . Migraines     "in the past" (12/09/2012)  . Osteoarthrosis, unspecified whether generalized or localized, unspecified site   . Arthritis     "all over" (12/09/2012)  . Chronic lower back pain   . Altered mental status 12/09/2012  . Diabetes mellitus without complication       Past Surgical History  Procedure Laterality Date  . Tubal ligation    . Tonsillectomy    . Shoulder arthroscopy w/ rotator cuff repair Right   . Carpal tunnel release  09/06/2011    Procedure: CARPAL TUNNEL RELEASE;  Surgeon: Kennieth Rad, MD;  Location: [facility] OR;  Service: Orthopedics;  Laterality: Right;  . Abdominal hysterectomy      "w/right ovary" (12/09/2012)  . Appendectomy    . Breast biopsy Right   . Esophagogastroduodenoscopy N/A 06/04/2013    Procedure: ESOPHAGOGASTRODUODENOSCOPY (EGD);  Surgeon: Graylin Shiver, MD;  Location[facility]  ENDOSCOPY;  Service: Endoscopy;  Laterality: N/A;       Prior to Admission medications   Medication Sig Start Date End Date Taking? Authorizing Provider  acetaminophen (TYLENOL) 500 MG tablet Take 500 mg by mouth every 6 (six) hours as needed. For pain   Yes Historical Provider, MD  albuterol (PROAIR HFA) 108 (90 BASE) MCG/ACT inhaler Inhale 2 puffs into the lungs every 6 (six) hours as needed for wheezing or shortness of breath. 06/08/13  Yes Esperanza Sheets, MD  albuterol (PROVENTIL) (5 MG/ML) 0.5% nebulizer solution Take 0.5 mLs (2.5 mg  total) by nebulization every 2 (two) hours as needed for wheezing or shortness of breath. 06/08/13  Yes Esperanza Sheets, MD  Ascorbic Acid (VITAMIN C) 500 MG tablet Take 500 mg by mouth daily.     Yes Historical Provider, MD  benzonatate (TESSALON) 100 MG capsule Take 100 mg by mouth 2 (two) times daily.   Yes Historical Provider, MD  chlorpheniramine-HYDROcodone (TUSSIONEX) 10-8 MG/5ML LQCR Take 5 mLs by mouth every 12 (twelve) hours as needed for cough.   Yes Historical Provider, MD  cholecalciferol (VITAMIN D) 1000 UNITS tablet Take 1,000 Units by mouth daily.   Yes Historical Provider, MD  donepezil (ARICEPT) 5 MG tablet Take 5 mg by mouth at bedtime.   Yes Historical Provider, MD  fexofenadine-pseudoephedrine (ALLEGRA-D 12 HOUR) 60-120 MG per tablet Take 1 tablet by mouth 2 (two) times daily.     Yes Historical Provider, MD  furosemide (LASIX) 40 MG tablet Take 0.5 tablets (20 mg total) by mouth daily. 06/08/13  Yes Esperanza Sheets, MD  IRON PO Take 1 tablet by mouth daily.   Yes Historical Provider, MD  Multiple Vitamin (MULTIVITAMIN) capsule Take 1 capsule by mouth daily.     Yes Historical Provider, MD  oxyCODONE-acetaminophen (PERCOCET/ROXICET) 5-325 MG per tablet Take 1 tablet by mouth every 6 (six) hours as needed for severe pain.   Yes Historical Provider, MD  potassium chloride SA (K-DUR,KLOR-CON) 20 MEQ tablet Take 20 mEq by mouth 2 (two) times daily.     Yes Historical Provider, MD  simvastatin (ZOCOR) 20 MG tablet Take 20 mg by mouth at bedtime.     Yes Historical Provider, MD  sitaGLIPtin-metformin (JANUMET) 50-500 MG per tablet Take 1 tablet by mouth daily.   Yes Historical Provider, MD  [facility] Wort 300 MG CAPS Take 300 mg by mouth daily.   Yes Historical Provider, MD  traMADol (ULTRAM) 50 MG tablet Take 50 mg by mouth daily as needed for moderate pain.   Yes Historical Provider, MD  venlafaxine (EFFEXOR) 37.5 MG tablet Take 37.5 mg by mouth daily as needed (hot flashes).   Yes  Historical Provider, MD  verapamil (COVERA HS) 240 MG (CO) 24 hr tablet Take 240 mg by mouth at bedtime.     Yes Historical Provider, MD  levofloxacin (LEVAQUIN) 500 MG tablet Take 1 tablet (500 mg total) by mouth daily. 06/08/13   Esperanza Sheets, MD      Allergies  Allergen Reactions  . Codeine Rash     angioedema  . Neomycin Itching  . Penicillins Itching and Rash       . Tetanus Toxoids Rash  . 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 She has never used smokeless tobacco. She reports that she drinks alcohol. She reports that she does not use illicit drugs.  Family History  Problem Relation Age of Onset  . Heart disease Father   . ALS Brother        Physical Exam:  GEN:  Pleasant Thin Frail appearing Elderly  73 y.o. African American female examined  and in no acute distress; cooperative with exam Filed Vitals:   06/21/13 0000 06/21/13 0107 06/21/13 0327 06/21/13 0412  BP: 138/73 145/77  140/76  Pulse: 88 91  97  Temp:  98.1 F (36.7 C)  98.3 F (36.8 C)  TempSrc:  Oral  Oral  Resp: 20 20  20   Height:  5\' 2"  (1.575 m)    Weight:  55.611 kg (122 lb 9.6 oz)    SpO2: 99% 99% 94% 98%   Blood pressure 140/76, pulse 97, temperature 98.3 F (36.8 C), temperature source Oral, resp. rate 20, height 5\' 2"  (1.575 m), weight 55.611 kg (122 lb 9.6 oz), SpO2 98.00%. PSYCH: She is alert and oriented x4; does not appear anxious does not appear depressed; affect is normal HEENT: Normocephalic and Atraumatic, Mucous membranes pink; PERRLA; EOM intact; Fundi:  Benign;  No scleral icterus, Nares: Patent, Oropharynx: Clear, Edentulous, Neck:  FROM, no cervical lymphadenopathy nor thyromegaly or carotid bruit; no JVD; Breasts:: Not examined CHEST WALL: No tenderness CHEST: Normal respiration, clear to auscultation bilaterally HEART: Regular rate and rhythm; no murmurs rubs or gallops BACK: No kyphosis  or scoliosis; no CVA tenderness ABDOMEN: Positive Bowel Sounds, Scaphoid,  soft non-tender; no masses, no organomegaly. Rectal Exam: Not done EXTREMITIES: No cyanosis, clubbing or edema; no ulcerations. Genitalia: not examined PULSES: 2+ and symmetric SKIN: Normal hydration no rash or ulceration CNS:   Alert x Oriented X 4,  Generalizied Weakness but No focal Deficits  Gait: deferred  Vascular: pulses palpable throughout    Labs on Admission:  Basic Metabolic Panel:  Recent Labs Lab 06/20/13 1929 06/21/13 0520  NA 140 137  K 3.0* 3.0*  CL 96 99  CO2 27 23  GLUCOSE 139* 110*  BUN 17 12  CREATININE 1.05 0.82  CALCIUM 10.2 9.2   Liver Function Tests:  Recent Labs Lab 06/20/13 1929  AST 21  ALT 14  ALKPHOS 116  BILITOT <0.2*  PROT 7.5  ALBUMIN 2.6*   No results found for this basename: LIPASE, AMYLASE,  in the last 168 hours No results found for this basename: AMMONIA,  in the last 168 hours CBC:  Recent Labs Lab 06/20/13 1929 06/21/13 0520  WBC 9.5 9.8  NEUTROABS 6.0  --   HGB 12.9 11.0*  HCT 41.2 33.1*  MCV 87.5 86.2  PLT 299 303   Cardiac Enzymes:  Recent Labs Lab 06/20/13 1929  TROPONINI <0.30    BNP (last 3 results)  Recent Labs  11/09/12 1638 06/20/13 1929  PROBNP 47.3 98.5   CBG:  Recent Labs Lab 06/21/13 0046  GLUCAP 108*    Radiological Exams on Admission: Dg Chest 2 View  06/20/2013   CLINICAL DATA:  Cough.  Pain.  EXAM: CHEST  2 VIEW  COMPARISON:  12/09/2012  FINDINGS: Cardiac silhouette is normal in size. Normal mediastinal and hilar contours  Mild medial left lung base opacity, likely atelectasis. No convincing infiltrate. No edema. No pleural effusion or pneumothorax.  Bony thorax is demineralized but grossly intact.  IMPRESSION: No acute cardiopulmonary disease.   Electronically Signed   By: Amie Portlandavid  Ormond M.D.   On: 06/20/2013 19:15      EKG: Independently reviewed.     Assessment/Plan:   73  y.o. female with   Principal Problem:   Weakness Active Problems:   Lactic acidosis   Hypokalemia   Diabetes mellitus   HYPERTENSION   Bronchiectasis without acute exacerbation   Alzheimer's disease   Unspecified protein-calorie malnutrition      1.  Weakness-  Check for causes, check TSH, and CPK level, and Physical Therapy consultation for Deconditioning.    2.  Lactic Acidosis-  Possible due to Metformin RX ( in her Saptiglipin/Metformin)  Discontinue the Metformin.   IVFs for Rehydration.     3.  Hypokalemia- Replete K+ and Check magnesium level.    4.  DM2- continue     And SSI coverage added, check HbA1c.  Discontinue the Metformin Rx due to #2.    5.   HTN- continue Verapamil  Rx, monitor BPs.   LAsix on Hold.     6.   Bronchiectasis-  Stable, recently completed a 7 day course of Levaquin.  Albuterol PRN.     7.   Alzheimer's Disease-  Continue Donezepril rx.    8.  DVT prophylaxis with Lovenox.        Code Status:   FULL CODE Family Communication:    No Family Present Disposition Plan:    Inpatient  Time spent:  35 Minutes  Ron Parker [facility] Pager [phone]  If 7PM-7AM, please contact night-coverage [email or web address] Password [email or web address] 06/21/2013, 8:08 AM

## 2013-06-21 NOTE — Progress Notes (Signed)
Utilization Review Completed.Kecia Swoboda T3/11/2013  

## 2013-06-21 NOTE — ED Provider Notes (Signed)
Medical screening examination/treatment/procedure(s) were performed by non-physician practitioner and as supervising physician I was immediately available for consultation/collaboration.   EKG Interpretation   Date/Time:  Monday June 15 2013 13:18:35 EST Ventricular Rate:  106 PR Interval:  122 QRS Duration: 76 QT Interval:  378 QTC Calculation: 502 R Axis:   32 Text Interpretation:  Sinus tachycardia T wave abnormality, consider  inferior ischemia Abnormal ECG ED PHYSICIAN INTERPRETATION AVAILABLE IN  [facility] Confirmed by TEST, Record ([id number]) on 06/17/2013 7:10:55 AM        Suzi RootsKevin E Sherin Murdoch, MD 06/21/13 [id number]

## 2013-06-22 ENCOUNTER — Inpatient Hospital Stay (HOSPITAL_COMMUNITY): Payer: Medicare Other

## 2013-06-22 DIAGNOSIS — R0989 Other specified symptoms and signs involving the circulatory and respiratory systems: Secondary | ICD-10-CM

## 2013-06-22 DIAGNOSIS — R0609 Other forms of dyspnea: Secondary | ICD-10-CM

## 2013-06-22 LAB — BASIC METABOLIC PANEL
BUN: 10 mg/dL (ref 6–23)
CO2: 24 meq/L (ref 19–32)
CREATININE: 0.85 mg/dL (ref 0.50–1.10)
Calcium: 9 mg/dL (ref 8.4–10.5)
Chloride: 107 mEq/L (ref 96–112)
GFR calc Af Amer: 77 mL/min — ABNORMAL LOW (ref >90)
GFR calc non Af Amer: 67 mL/min — ABNORMAL LOW (ref >90)
GLUCOSE: 113 mg/dL — AB (ref 70–99)
Potassium: 3.5 mEq/L — ABNORMAL LOW (ref 3.7–5.3)
Sodium: 144 mEq/L (ref 137–147)

## 2013-06-22 LAB — HEMOGLOBIN A1C
Hgb A1c MFr Bld: 6 % — ABNORMAL HIGH (ref <5.7)
Mean Plasma Glucose: 126 mg/dL — ABNORMAL HIGH (ref <117)

## 2013-06-22 LAB — CBC
HCT: 32.7 % — ABNORMAL LOW (ref 36.0–46.0)
Hemoglobin: 11 g/dL — ABNORMAL LOW (ref 12.0–15.0)
MCH: 29.1 pg (ref 26.0–34.0)
MCHC: 33.6 g/dL (ref 30.0–36.0)
MCV: 86.5 fL (ref 78.0–100.0)
PLATELETS: 283 10*3/uL (ref 150–400)
RBC: 3.78 MIL/uL — AB (ref 3.87–5.11)
RDW: 15 % (ref 11.5–15.5)
WBC: 9.3 10*3/uL (ref 4.0–10.5)

## 2013-06-22 LAB — LACTIC ACID, PLASMA: LACTIC ACID, VENOUS: 0.7 mmol/L (ref 0.5–2.2)

## 2013-06-22 LAB — FOLATE RBC: RBC FOLATE: 1023 ng/mL — AB (ref >280)

## 2013-06-22 LAB — GLUCOSE, CAPILLARY
GLUCOSE-CAPILLARY: 133 mg/dL — AB (ref 70–99)
Glucose-Capillary: 104 mg/dL — ABNORMAL HIGH (ref 70–99)
Glucose-Capillary: 120 mg/dL — ABNORMAL HIGH (ref 70–99)

## 2013-06-22 LAB — MAGNESIUM: Magnesium: 1.2 mg/dL — ABNORMAL LOW (ref 1.5–2.5)

## 2013-06-22 MED ORDER — HYDRALAZINE HCL 25 MG PO TABS
25.0000 mg | ORAL_TABLET | Freq: Two times a day (BID) | ORAL | Status: DC
  Administered 2013-06-22 – 2013-06-23 (×2): 25 mg via ORAL
  Filled 2013-06-22 (×3): qty 1

## 2013-06-22 MED ORDER — MAGNESIUM SULFATE 40 MG/ML IJ SOLN
2.0000 g | Freq: Once | INTRAMUSCULAR | Status: AC
  Administered 2013-06-22: 2 g via INTRAVENOUS
  Filled 2013-06-22: qty 50

## 2013-06-22 MED ORDER — SODIUM CHLORIDE 0.45 % IV SOLN
INTRAVENOUS | Status: DC
  Administered 2013-06-22 – 2013-06-23 (×2): via INTRAVENOUS

## 2013-06-22 MED ORDER — ENSURE COMPLETE PO LIQD
237.0000 mL | Freq: Two times a day (BID) | ORAL | Status: DC
Start: 2013-06-22 — End: 2013-06-23
  Administered 2013-06-22 – 2013-06-23 (×2): 237 mL via ORAL

## 2013-06-22 NOTE — Progress Notes (Signed)
[facility] PROGRESS NOTE  Sheila Cisneros ZOX:[id number] DOB: 07/01/1940 DOA: 06/20/2013 PCP: Alva GarnetSHELTON,KIMBERLY R., MD  Assessment/Plan: Hypercalcemia  -Likely due to immobility  -Initial Corrected calcium 11.3 -Improved with IV fluids  -Check ionized calcium--1.29  Hypokalemia  -Replete  -Check magnesium--replete Hypomagnesemia -Related  Generalize weakness/generalized pain  -Multifactorial including electrolyte derangement, deconditioning  -PT evaluation--> skilled nursing facility  -Check TSH--2.063  -Serum B12>2K -rbc folate--pending  -Urinalysis negative for any pyuria  Chest pain  -EKG without ST-T wave changes  -Serial troponins--negative  -echo--EF 55-60% Low back pain with history of spinal stenosis  -MR lumbar spine--severe spinal stenosis L4-5, L5-S1,  -MR cervical spine--mild to moderate narrowing, worse C5-6  -PT evaluation  Diabetes mellitus type 2  -Check hemoglobin A1c--5.7  -Continue NovoLog sliding scale  Dementia, Alzheimer's type  -Continue Aricept  Lactic acidosis  -likely due to hypovolemia  -Improved with IV fluids  Lower extremity pain and edema  -Venous duplex on DVT  Hypertension  -Continue verapamil -Add hydralazine DVT bilateral lower extremities -Indeterminate age -Discussed with Dr. Arbutus PedMohamed, no need for anticoagulation at this time especially in light of the patient's recent GI bleed    Family Communication:  Updated as telephone Disposition Plan:   Skilled nursing facility       Procedures/Studies: Dg Chest 2 View  06/20/2013   CLINICAL DATA:  Cough.  Pain.  EXAM: CHEST  2 VIEW  COMPARISON:  12/09/2012  FINDINGS: Cardiac silhouette is normal in size. Normal mediastinal and hilar contours  Mild medial left lung base opacity, likely atelectasis. No convincing infiltrate. No edema. No pleural effusion or pneumothorax.  Bony thorax is demineralized but grossly intact.  IMPRESSION: No acute cardiopulmonary disease.    Electronically Signed   By: Amie Portlandavid  Ormond M.D.   On: 06/20/2013 19:15   Dg Shoulder Right  06/15/2013   CLINICAL DATA:  Proximal humeral pain.  Limited range of motion.  EXAM: RIGHT SHOULDER - 2+ VIEW  COMPARISON:  DG FINGER INDEX 2+V*R* dated 09/16/2012; MR EXTREM UP JT*R* W/O CM dated 09/29/2004; US VENOUS IMAGING UNILATERAL*R* dated 01/05/2005  FINDINGS: Degenerative glenohumeral arthropathy noted with spurring of the acromioclavicular joint. No dislocation or acute fracture.  IMPRESSION: 1. No acute bony findings. 2. Degenerative glenohumeral arthropathy. Degenerative AC joint arthropathy.   Electronically Signed   By: Herbie BaltimoreWalt  Liebkemann M.D.   On: 06/15/2013 16:26   Mr Cervical Spine Wo Contrast  06/21/2013   CLINICAL DATA:  Neck pain and back pain.  Spinal stenosis.  EXAM: MRI CERVICAL AND LUMBAR SPINE WITHOUT CONTRAST  TECHNIQUE: Multiplanar and multiecho pulse sequences of the cervical spine, to include the craniocervical junction and cervicothoracic junction, and lumbar spine, were obtained without intravenous contrast.  COMPARISON:  DG LUMBAR SPINE 2-3 VIEWS dated 12/10/2012; MR L SPINE W/O dated 09/18/2012; MR HEAD W/O CM dated 12/10/2012  FINDINGS: MRI CERVICAL SPINE FINDINGS  There is mild reversal of the normal cervical lordosis. There is grade 1 anterolisthesis of C3 on C4, and there is grade 1 retrolisthesis of C5 on C6 and of C6 on C7. There is mild anterior vertebral body height loss at C5. There is severe disc space narrowing at C5-6 greater than C6-7, with associated degenerative marrow changes. Patchy T2 hyperintensity is partially visualized in the brainstem, likely not significantly changed from the prior brain MRI and compatible with chronic microvascular ischemic change. The cervical spinal cord is normal in caliber and signal. Visualized paraspinal soft tissues are unremarkable.  C2-3:  Mild right facet arthrosis  without stenosis.  C3-4: Broad-based posterior disc osteophyte complex and facet  hypertrophy result in mild left neural foraminal narrowing. No spinal canal stenosis.  C4-5: Broad-based posterior disc osteophyte complex and facet hypertrophy result in mild bilateral neural foraminal narrowing. No spinal canal stenosis.  C5-6: Broad-based posterior disc osteophyte complex and facet hypertrophy result in moderate bilateral neural foraminal stenosis without spinal canal stenosis.  C6-7: Disc bulge and uncovertebral hypertrophy result in mild left neural foraminal stenosis without spinal canal stenosis.  C7-T1:  Facet hypertrophy without stenosis.  The upper thoracic spine is only imaged sagittally and demonstrates small right-sided disc protrusions from T2-3 to T4-5 without evidence of stenosis.  MRI LUMBAR SPINE FINDINGS  Transitional lumbosacral anatomy is again seen. The numbering employed on the prior MRI will be continued on this examination, with the transitional level labeled S1 and the last fully formed intervertebral disc space being S1-2.  S shaped thoracolumbar scoliosis is again seen. Grade 1 anterolisthesis of L5 on S1 is unchanged. Prominent multilevel disc space narrowing is seen, greatest at L5-S1. Multilevel degenerative marrow changes are present, including mild degenerative marrow edema at T12-L1. The conus medullaris is normal in signal and terminates at L2-3. The visualized paraspinal soft tissues are unremarkable.  T12-L1: Only imaged sagittally. Broad-based disc and osteophyte complex eccentric to the left and moderate left facet arthrosis likely result in mild left neural foraminal narrowing, grossly unchanged.  L1-2: Disc bulge and endplate spurring eccentric to the left and facet hypertrophy result in mild spinal stenosis and mild left neural foraminal stenosis, unchanged.  L2-3: Circumferential disc bulge and facet and ligamentum flavum hypertrophy result in right greater than left lateral recess narrowing, mild to moderate spinal stenosis, and mild bilateral neural  foraminal narrowing, unchanged.  L3-4: Disc bulge and facet and ligamentum flavum hypertrophy result in moderate spinal stenosis and mild right neural foraminal narrowing, unchanged.  L4-5: Disc bulge and severe facet and ligamentum flavum hypertrophy result in severe spinal stenosis and severe right neural foraminal stenosis, unchanged.  L5-S1: Listhesis with uncovering of the disc, disc bulging, and severe facet and ligamentum flavum hypertrophy result in severe spinal stenosis and severe left neural foraminal stenosis, unchanged.  S1-2: Moderate facet hypertrophy results in mild right neural foraminal narrowing, unchanged. No spinal stenosis.  IMPRESSION: 1. Multilevel degenerative disc disease and facet arthrosis in the cervical spine with mild to moderate neural foraminal narrowing as above, greatest at C5-6. No cervical spinal stenosis. 2. Thoracolumbar scoliosis with advanced multilevel degenerative disc disease and facet arthrosis in the lumbar spine, not significantly changed from the prior study. Spinal stenosis is severe at L4-5 and L5-S1.   Electronically Signed   By: Sebastian Ache   On: 06/21/2013 20:36   Mr Lumbar Spine Wo Contrast  06/21/2013   CLINICAL DATA:  Neck pain and back pain.  Spinal stenosis.  EXAM: MRI CERVICAL AND LUMBAR SPINE WITHOUT CONTRAST  TECHNIQUE: Multiplanar and multiecho pulse sequences of the cervical spine, to include the craniocervical junction and cervicothoracic junction, and lumbar spine, were obtained without intravenous contrast.  COMPARISON:  DG LUMBAR SPINE 2-3 VIEWS dated 12/10/2012; MR L SPINE W/O dated 09/18/2012; MR HEAD W/O CM dated 12/10/2012  FINDINGS: MRI CERVICAL SPINE FINDINGS  There is mild reversal of the normal cervical lordosis. There is grade 1 anterolisthesis of C3 on C4, and there is grade 1 retrolisthesis of C5 on C6 and of C6 on C7. There is mild anterior vertebral body height loss at C5. There is severe  disc space narrowing at C5-6 greater than C6-7,  with associated degenerative marrow changes. Patchy T2 hyperintensity is partially visualized in the brainstem, likely not significantly changed from the prior brain MRI and compatible with chronic microvascular ischemic change. The cervical spinal cord is normal in caliber and signal. Visualized paraspinal soft tissues are unremarkable.  C2-3:  Mild right facet arthrosis without stenosis.  C3-4: Broad-based posterior disc osteophyte complex and facet hypertrophy result in mild left neural foraminal narrowing. No spinal canal stenosis.  C4-5: Broad-based posterior disc osteophyte complex and facet hypertrophy result in mild bilateral neural foraminal narrowing. No spinal canal stenosis.  C5-6: Broad-based posterior disc osteophyte complex and facet hypertrophy result in moderate bilateral neural foraminal stenosis without spinal canal stenosis.  C6-7: Disc bulge and uncovertebral hypertrophy result in mild left neural foraminal stenosis without spinal canal stenosis.  C7-T1:  Facet hypertrophy without stenosis.  The upper thoracic spine is only imaged sagittally and demonstrates small right-sided disc protrusions from T2-3 to T4-5 without evidence of stenosis.  MRI LUMBAR SPINE FINDINGS  Transitional lumbosacral anatomy is again seen. The numbering employed on the prior MRI will be continued on this examination, with the transitional level labeled S1 and the last fully formed intervertebral disc space being S1-2.  S shaped thoracolumbar scoliosis is again seen. Grade 1 anterolisthesis of L5 on S1 is unchanged. Prominent multilevel disc space narrowing is seen, greatest at L5-S1. Multilevel degenerative marrow changes are present, including mild degenerative marrow edema at T12-L1. The conus medullaris is normal in signal and terminates at L2-3. The visualized paraspinal soft tissues are unremarkable.  T12-L1: Only imaged sagittally. Broad-based disc and osteophyte complex eccentric to the left and moderate left  facet arthrosis likely result in mild left neural foraminal narrowing, grossly unchanged.  L1-2: Disc bulge and endplate spurring eccentric to the left and facet hypertrophy result in mild spinal stenosis and mild left neural foraminal stenosis, unchanged.  L2-3: Circumferential disc bulge and facet and ligamentum flavum hypertrophy result in right greater than left lateral recess narrowing, mild to moderate spinal stenosis, and mild bilateral neural foraminal narrowing, unchanged.  L3-4: Disc bulge and facet and ligamentum flavum hypertrophy result in moderate spinal stenosis and mild right neural foraminal narrowing, unchanged.  L4-5: Disc bulge and severe facet and ligamentum flavum hypertrophy result in severe spinal stenosis and severe right neural foraminal stenosis, unchanged.  L5-S1: Listhesis with uncovering of the disc, disc bulging, and severe facet and ligamentum flavum hypertrophy result in severe spinal stenosis and severe left neural foraminal stenosis, unchanged.  S1-2: Moderate facet hypertrophy results in mild right neural foraminal narrowing, unchanged. No spinal stenosis.  IMPRESSION: 1. Multilevel degenerative disc disease and facet arthrosis in the cervical spine with mild to moderate neural foraminal narrowing as above, greatest at C5-6. No cervical spinal stenosis. 2. Thoracolumbar scoliosis with advanced multilevel degenerative disc disease and facet arthrosis in the lumbar spine, not significantly changed from the prior study. Spinal stenosis is severe at L4-5 and L5-S1.   Electronically Signed   By: Sebastian Ache   On: 06/21/2013 20:36   Ct Abdomen Pelvis W Contrast  06/04/2013   CLINICAL DATA:  Pain.  EXAM: CT ABDOMEN AND PELVIS WITH CONTRAST  TECHNIQUE: Multidetector CT imaging of the abdomen and pelvis was performed using the standard protocol following bolus administration of intravenous contrast.  CONTRAST:  80mL OMNIPAQUE IOHEXOL 300 MG/ML  SOLN  COMPARISON:  CT ABD/PELVIS W CM  dated 10/08/2011  FINDINGS: Included view of the lung  bases demonstrates patchy consolidation in the left lung base with bronchiectasis and scarring. The heart and pericardium are unremarkable.  Moderate hiatal hernia. Remainder of the stomach is unremarkable. Small and large bowel are normal in course and caliber without inflammatory changes. No gastric wall thickening on today's examination. Contrast as yet to reach the large bowel.  14 mm cyst in the segment 7 of the liver, unchanged. The liver is otherwise unremarkable. The gallbladder, spleen, adrenal glands are nonsuspicious. Atrophic pancreas is unchanged.  No intraperitoneal free fluid nor free air. 2 mm cortical based right renal interpolar calcification. Additional too small to characterize hypodensities in the right kidney, the kidneys are otherwise unremarkable. Delayed imaging demonstrates prompt symmetric excretion into the proximal urinary collecting systems. Aortoiliac vessels are normal in course and caliber with mild calcific atherosclerosis. Status post hysterectomy. Urinary bladder is partially distended unremarkable.  Soft tissues are unremarkable. Grade 2 L4-5 anterolisthesis without pars interarticularis defects. Moderate L4-5 canal stenosis and moderate to severe neural foraminal narrowing at this level. Moderate to severe L3-4 neural foraminal narrowing. Small fat containing umbilical hernia.  IMPRESSION: Patchy consolidation in the left lung base concerning for pneumonia, in a background of bronchiectasis and scarring.  No acute intra-abdominal or pelvic process. No gastric wall thickening on today's examination.  Moderate hiatal hernia.   Electronically Signed   By: Awilda Metro   On: 06/04/2013 01:45         Subjective: Patient comes in complaining of right shoulder pain. She complains of pain all over. Denies any fevers, chills, chest discomfort, shortness breath, nausea, vomiting, diarrhea, abdominal pain. No diarrhea. No  dysuria.  Objective: Filed Vitals:   06/22/13 0513 06/22/13 0730 06/22/13 1239 06/22/13 1645  BP: 178/83 175/85 156/75 186/106  Pulse: 88 77 79 87  Temp: 98.6 F (37 C) 98.9 F (37.2 C) 98.4 F (36.9 C) 98.1 F (36.7 C)  TempSrc: Oral Oral Oral Oral  Resp: 18 17 18 18   Height:      Weight:      SpO2: 100% 100% 100% 97%    Intake/Output Summary (Last 24 hours) at 06/22/13 1858 Last data filed at 06/22/13 0300  Gross per 24 hour  Intake    675 ml  Output      0 ml  Net    675 ml   Weight change: 3.736 kg (8 lb 3.8 oz) Exam:   General:  Pt is alert, follows commands appropriately, not in acute distress  HEENT: No icterus, No thrush, [place]/AT  Cardiovascular: RRR, S1/S2, no rubs, no gallops  Respiratory: CTA bilaterally, no wheezing, no crackles, no rhonchi  Abdomen: Soft/+BS, non tender, non distended, no guarding  Extremities: No edema, No lymphangitis, No petechiae, No rashes, no synovitis  Data Reviewed: Basic Metabolic Panel:  Recent Labs Lab 06/20/13 1929 06/21/13 0520 06/22/13 0647  NA 140 137 144  K 3.0* 3.0* 3.5*  CL 96 99 107  CO2 27 23 24   GLUCOSE 139* 110* 113*  BUN 17 12 10   CREATININE 1.05 0.82 0.85  CALCIUM 10.2 9.2 9.0  MG  --   --  1.2*   Liver Function Tests:  Recent Labs Lab 06/20/13 1929  AST 21  ALT 14  ALKPHOS 116  BILITOT <0.2*  PROT 7.5  ALBUMIN 2.6*   No results found for this basename: LIPASE, AMYLASE,  in the last 168 hours No results found for this basename: AMMONIA,  in the last 168 hours CBC:  Recent Labs Lab 06/20/13  1929 06/21/13 0520 06/22/13 0647  WBC 9.5 9.8 9.3  NEUTROABS 6.0  --   --   HGB 12.9 11.0* 11.0*  HCT 41.2 33.1* 32.7*  MCV 87.5 86.2 86.5  PLT 299 303 283   Cardiac Enzymes:  Recent Labs Lab 06/20/13 1929 06/21/13 0850  CKTOTAL  --  49  TROPONINI <0.30 <0.30   BNP: No components found with this basename: POCBNP,  CBG:  Recent Labs Lab 06/21/13 1128 06/21/13 2105 06/22/13 0731  06/22/13 1235 06/22/13 1656  GLUCAP 139* 144* 104* 120* 133*    No results found for this or any previous visit (from the past 240 hour(s)).   Scheduled Meds: . atorvastatin  10 mg Oral q1800  . benzonatate  100 mg Oral BID  . cholecalciferol  1,000 Units Oral Daily  . donepezil  5 mg Oral QHS  . enoxaparin (LOVENOX) injection  40 mg Subcutaneous Q24H  . feeding supplement (ENSURE COMPLETE)  237 mL Oral BID BM  . insulin aspart  0-5 Units Subcutaneous QHS  . insulin aspart  0-9 Units Subcutaneous TID WC  . loratadine  10 mg Oral Daily  . multivitamin with minerals  1 tablet Oral Daily  . potassium chloride SA  20 mEq Oral BID  . verapamil  240 mg Oral QHS   Continuous Infusions: . sodium chloride 1,000 mL (06/21/13 1203)     Odester Nilson, DO  [facility] Pager [phone]  If 7PM-7AM, please contact night-coverage [email or web address] Password [email or web address] 06/22/2013, 6:58 PM   LOS: 2 days

## 2013-06-22 NOTE — Progress Notes (Signed)
Echo Lab  2D Echocardiogram completed.  Mindy Gali L Shantay Sonn, RDCS 06/22/2013 11:29 AM

## 2013-06-22 NOTE — Progress Notes (Signed)
INITIAL NUTRITION ASSESSMENT  DOCUMENTATION CODES Per approved criteria  -Severe malnutrition in the context of chronic illness   INTERVENTION: Add Ensure Complete po BID, each supplement provides 350 kcal and 13 grams of protein. Prefers vanilla or stra Monitor magnesium, potassium, and phosphorus daily for at least 3 days, MD to replete as needed, as pt is at risk for refeeding syndrome given ongoing poor oral intake. RD to continue to follow nutrition care plan.  NUTRITION DIAGNOSIS: Inadequate oral intake related to poor appetite as evidenced by dietary recall and ongoing weight loss.   Goal: Intake to meet >90% of estimated nutrition needs.  Monitor:  weight trends, lab trends, I/O's, PO intake, supplement tolerance  Reason for Assessment: Malnutrition Screening Tool  73 y.o. female  Admitting Dx: Weakness  ASSESSMENT: PMHx significant for gout, HTN, GERD, COPD, bronchitis, hepatitis. Admitted with weakness and body aches x few days. Work-up reveals hypercalcemia.  Pt with 12% wt loss x 4 months. Pt confirms ongoing weight loss, however she is pleased with her weight loss. She worries about weight gain "daily." Encouraged weight maintenance with no further weight loss. Pt reports that intake at home has been poor since September, consistent with weight loss since that time. Eats very small amounts at meal times or goes entire days without eating. Pt currently eating - has consumed soup, a bite of her toast, and is now working on eating her applesauce. Will drink Ensure at home, agreeable to receiving while here.  Potassium is low at 3.5 Magnesium is low at 1.2 CBG's: 120, 104, 144 HgbA1c: 5.7  Pt ordered for: Vitamin D, novolog, MVI, KCl IVF of NS at 75 ml/hr  Height: Ht Readings from Last 1 Encounters:  06/21/13 5\' 2"  (1.575 m)    Weight: Wt Readings from Last 1 Encounters:  06/21/13 123 lb 3.8 oz (55.9 kg)    Ideal Body Weight: 110 lb  % Ideal Body Weight:  112%  Wt Readings from Last 10 Encounters:  06/21/13 123 lb 3.8 oz (55.9 kg)  06/15/13 120 lb (54.432 kg)  06/04/13 121 lb (54.885 kg)  06/04/13 121 lb (54.885 kg)  02/17/13 141 lb 6.4 oz (64.139 kg)  12/18/12 146 lb (66.225 kg)  12/09/12 147 lb 12.8 oz (67.042 kg)  11/20/12 141 lb 9.6 oz (64.229 kg)  09/16/12 141 lb (63.957 kg)  09/01/12 138 lb 3.2 oz (62.687 kg)    Usual Body Weight: 140 lb  % Usual Body Weight: 88%  BMI:  Body mass index is 22.53 kg/(m^2). Normal weight  Estimated Nutritional Needs: Kcal: 1600 - 1750 kcal Protein: 55 - 70 g Fluid: 1.6 - 1.8 liters daily  Skin: intact  Diet Order: Carb Control  EDUCATION NEEDS: -No education needs identified at this time   Intake/Output Summary (Last 24 hours) at 06/22/13 1324 Last data filed at 06/22/13 0300  Gross per 24 hour  Intake 1121.25 ml  Output      0 ml  Net 1121.25 ml    Last BM: 3/8   Labs:   Recent Labs Lab 06/20/13 1929 06/21/13 0520 06/22/13 0647  NA 140 137 144  K 3.0* 3.0* 3.5*  CL 96 99 107  CO2 27 23 24   BUN 17 12 10   CREATININE 1.05 0.82 0.85  CALCIUM 10.2 9.2 9.0  MG  --   --  1.2*  GLUCOSE 139* 110* 113*    CBG (last 3)   Recent Labs  06/21/13 2105 06/22/13 0731 06/22/13 1235  GLUCAP 144*  104* 120*   Lab Results  Component Value Date   HGBA1C 5.7* 06/21/2013    Scheduled Meds: . atorvastatin  10 mg Oral q1800  . benzonatate  100 mg Oral BID  . cholecalciferol  1,000 Units Oral Daily  . donepezil  5 mg Oral QHS  . enoxaparin (LOVENOX) injection  40 mg Subcutaneous Q24H  . insulin aspart  0-5 Units Subcutaneous QHS  . insulin aspart  0-9 Units Subcutaneous TID WC  . loratadine  10 mg Oral Daily  . multivitamin with minerals  1 tablet Oral Daily  . potassium chloride SA  20 mEq Oral BID  . verapamil  240 mg Oral QHS    Continuous Infusions: . sodium chloride 1,000 mL (06/21/13 1203)    Past Medical History  Diagnosis Date  . Gout, unspecified   .  Other chronic sinusitis   . Unspecified chronic bronchitis     sees Dr. Melba Coon, last visit 08/2011, treated for E. Coli- resp. /w Ceftin, Feb. 2013  . Hypertension   . GERD (gastroesophageal reflux disease)     rolaids if needed  . COPD (chronic obstructive pulmonary disease)   . Pneumonia     "couple times; long time ago" (12/09/2012)  . Chronic bronchitis     "couple times/yr" (12/09/2012)  . Exertional shortness of breath   . History of blood transfusion     "related to a surgery, I think" (12/09/2012)  . Hepatitis, unspecified     "the one that's not bad" (12/09/2012)  . Migraines     "in the past" (12/09/2012)  . Osteoarthrosis, unspecified whether generalized or localized, unspecified site   . Arthritis     "all over" (12/09/2012)  . Chronic lower back pain   . Altered mental status 12/09/2012  . Diabetes mellitus without complication     Past Surgical History  Procedure Laterality Date  . Tubal ligation    . Tonsillectomy    . Shoulder arthroscopy w/ rotator cuff repair Right   . Carpal tunnel release  09/06/2011    Procedure: CARPAL TUNNEL RELEASE;  Surgeon: Kennieth Rad, MD;  Location: [facility] OR;  Service: Orthopedics;  Laterality: Right;  . Abdominal hysterectomy      "w/right ovary" (12/09/2012)  . Appendectomy    . Breast biopsy Right   . Esophagogastroduodenoscopy N/A 06/04/2013    Procedure: ESOPHAGOGASTRODUODENOSCOPY (EGD);  Surgeon: Graylin Shiver, MD;  Location: [facility] ENDOSCOPY;  Service: Endoscopy;  Laterality: N/A;    Jarold Motto MS, RD, LDN Inpatient Registered Dietitian Pager: [phone] After-hours pager: [phone]

## 2013-06-23 LAB — BASIC METABOLIC PANEL
BUN: 9 mg/dL (ref 6–23)
CO2: 20 mEq/L (ref 19–32)
Calcium: 9.1 mg/dL (ref 8.4–10.5)
Chloride: 104 mEq/L (ref 96–112)
Creatinine, Ser: 0.71 mg/dL (ref 0.50–1.10)
GFR calc non Af Amer: 84 mL/min — ABNORMAL LOW (ref >90)
Glucose, Bld: 135 mg/dL — ABNORMAL HIGH (ref 70–99)
Potassium: 4.3 mEq/L (ref 3.7–5.3)
Sodium: 139 mEq/L (ref 137–147)

## 2013-06-23 LAB — MAGNESIUM: MAGNESIUM: 1.7 mg/dL (ref 1.5–2.5)

## 2013-06-23 MED ORDER — ENSURE COMPLETE PO LIQD: 237.0000 mL | Freq: Two times a day (BID) | ORAL | Status: DC

## 2013-06-23 MED ORDER — LORATADINE 10 MG PO TABS: 10.0000 mg | ORAL_TABLET | Freq: Every day | ORAL | Status: DC

## 2013-06-23 MED ORDER — WHITE PETROLATUM GEL
Status: AC
  Administered 2013-06-23: 0.2
  Filled 2013-06-23: qty 5

## 2013-06-23 MED ORDER — HYDRALAZINE HCL 25 MG PO TABS: 25.0000 mg | ORAL_TABLET | Freq: Two times a day (BID) | ORAL | Status: DC

## 2013-06-23 MED ORDER — OXYCODONE-ACETAMINOPHEN 5-325 MG PO TABS: 1.0000 | ORAL_TABLET | Freq: Four times a day (QID) | ORAL | Status: DC | PRN

## 2013-06-23 MED ORDER — SITAGLIPTIN PHOSPHATE 50 MG PO TABS: 50.0000 mg | ORAL_TABLET | Freq: Every day | ORAL | Status: DC

## 2013-06-23 NOTE — Clinical Social Work Psychosocial (Signed)
Clinical Social Work Department BRIEF PSYCHOSOCIAL ASSESSMENT 06/23/2013  Patient:  Sheila Cisneros,Sheila Cisneros     Account Number:  [id number]     Admit date:  06/20/2013  Clinical Social Worker:  Delmer IslamRAWFORD,VANESSA, LCSW  Date/Time:  06/23/2013 11:06 AM  Referred by:  Physician  Date Referred:  06/22/2013 Referred for  SNF Placement   Other Referral:   Interview type:  Patient Other interview type:    PSYCHOSOCIAL DATA Living Status:  HUSBAND Admitted from facility:   Level of care:   Primary support name:  Sheila Cisneros Primary support relationship to patient:  SPOUSE Degree of support available:    CURRENT CONCERNS Current Concerns  Post-Acute Placement   Other Concerns:    SOCIAL WORK ASSESSMENT / PLAN CSW intern introduce self to patient. Patient was open to talk with CSW intern. Patient was feeling well. CSW intern discussed and explained discharge plans with patient. [name] inquired about certain facilities and is content with a facility near her home. Patient has a list of skilled nursing facilities and CSW intern contact information for any concerns or questions.    Assessment/plan status:  Psychosocial Support/Ongoing Assessment of Needs Other assessment/ plan:   Information/referral to community resources:   SNF Placement.    PATIENT'S/FAMILY'S RESPONSE TO PLAN OF CARE: Patient was willing to speak with CSW intern. Patient seemed to be in high spirits and very content with discharge plans. Patient is willing and ready for short term rehab.  [name] has CSW intern contact information for any additional needs.

## 2013-06-23 NOTE — Clinical Social Work Psychosocial (Signed)
Note reviewed and approved as written.  Alena Blankenbeckler, MSW, LCSW [phone] 

## 2013-06-23 NOTE — Progress Notes (Addendum)
Physical Therapy Treatment Patient Details Name: Sheila Cisneros MRN: [id number] DOB: 1940/08/11 Today's Date: 06/23/2013 Time: 0940-1003 PT Time Calculation (min): 23 min  PT Assessment / Plan / Recommendation  History of Present Illness Sheila Cisneros is a 73 y.o. female with Multiple Medical Problems  Who was brought to the ED due to complaints of weakness and body aches worsening over the past few days.  She was not able to walk during the past 24 hours and her husband had her brought to the ED  For evalauation.    PT Comments   Patient able to walk in room and perform ADL standing at sink.  Needed assist for safety with perineal hygiene following toileting due to severe flexed posture and poor postural support.  Guards right UE with reported rotator cuff tear.  Seems much more cognitively intact compared to last session.  Will benefit from SNF level rehab at d/c.   Follow Up Recommendations  SNF     Does the patient have the potential to tolerate intense rehabilitation   N/A  Barriers to Discharge  None      Equipment Recommendations  None recommended by PT    Recommendations for Other Services  None  Frequency Min 2X/week   Progress towards PT Goals Progress towards PT goals: Progressing toward goals  Plan Current plan remains appropriate    Precautions / Restrictions Precautions Precautions: Fall   Pertinent Vitals/Pain C/o right foot pain about 8/10 with ambulation    Mobility  Bed Mobility Overal bed mobility: Needs Assistance Bed Mobility: Supine to Sit Supine to sit: [facility];Min assist Transfers Overall transfer level: Needs assistance Transfers: Sit to/from Stand;Stand Pivot Transfers Sit to Stand: Min assist Stand pivot transfers: Mod assist General transfer comment: assist to pivot [facility], assist to [facility] from bed and [facility] with cues for safety Ambulation/Gait Ambulation/Gait assistance: Min assist Ambulation Distance (Feet): 17 Feet Assistive device:  Rolling walker (2 wheeled) Gait Pattern/deviations: Step-through pattern;Shuffle;Decreased stride length;Trunk flexed General Gait Details: flexed posture, c/o right foot pain throughout, difficulty with right UE use on walker with reports of right rotator cuff tear, assist to maneuver around obstacles to get around bed to chair      PT Goals (current goals can now be found in the care plan section)    Visit Information  Last PT Received On: 06/23/13 Assistance Needed: +1 History of Present Illness: Sheila Cisneros is a [age] y.o. female with Multiple Medical Problems  Who was brought to the ED due to complaints of weakness and body aches worsening over the past few days.  She was not able to walk during the past 24 hours and her husband had her brought to the ED  For evalauation.     Subjective Data      Cognition  Cognition Arousal/Alertness: Awake/alert Behavior During Therapy: WFL for tasks assessed/performed Overall Cognitive Status: Within Functional Limits for tasks assessed    Balance  Balance Overall balance assessment: Needs assistance Sitting-balance support: Feet unsupported Sitting balance-Leahy Scale: Fair Sitting balance - Comments: sitting on BSC Standing balance support: Bilateral upper extremity supported Standing balance-Leahy Scale: Poor Standing balance comment: stood about 7 minutes at sink to wash hands, face and brush teeth, leans down on elbows for support with flexed posture  End of Session PT - End of Session Equipment Utilized During Treatment: Gait belt Activity Tolerance: Patient limited by pain;Patient limited by fatigue Patient left: in chair;with call bell/phone within reach   GP  Ethelyne Erich,CYNDI 06/23/2013, 10:18 AM Sheran Lawless, PT [phone] 06/23/2013

## 2013-06-23 NOTE — Discharge Summary (Signed)
Physician Discharge Summary  Sheila Cisneros:[id number] DOB: 1940-05-13 DOA: 06/20/2013  PCP: Sheila Cisneros., MD  Admit date: 06/20/2013 Discharge date: 06/23/2013  Recommendations for Outpatient Follow-up:  1. Pt will need to follow up with PCP in 2 weeks post discharge 2. Please obtain BMP to evaluate electrolytes and kidney function 3. Please also check CBC to evaluate Hg and Hct levels  Discharge Diagnoses:  Hypercalcemia  -Likely due to immobility and dehydration -Initial Corrected calcium 11.3  -Improved with IV fluids  -Check ionized calcium--1.29 (after 1 L IVF)  Hypokalemia  -Replete  -Check magnesium Hypomagnesemia  -Related  -repleted -Magnesium 1.7 on day of discharge Generalize weakness/generalized pain  -Multifactorial including electrolyte derangement, deconditioning  -PT evaluation--> skilled nursing facility  -Check TSH--2.063  -Serum B12>2K  -rbc folate--1023  -Urinalysis negative for any pyuria  -Gradually improved with correction of the patient's left with abnormalities and dehydration. However the patient remained weak and PT recommended skilled nursing facility the- Chest pain  -EKG without ST-T wave changes  -Serial troponins--negative  -echo--EF 55-60%  Low back pain with history of spinal stenosis  -MR lumbar spine--severe spinal stenosis L4-5, L5-S1,  -MR cervical spine--mild to moderate narrowing, worse C5-6  -PT evaluation  Diabetes mellitus type 2  -Check hemoglobin A1c--5.7  -Continue NovoLog sliding scale  -Given the patient's propensity for renal failure from dehydration, I will not restart metformin at this time -Patient will restart sitagliptin--renal function will need to be monitored and dose adjusted if renal insufficiency becomes a problem. Dementia, Alzheimer's type  -Continue Aricept  Lactic acidosis  -likely due to hypovolemia and pt's metformin in the setting of renal insufficiency -Improved with IV fluids  Lower extremity  pain and edema  -Venous duplex on DVT--indeterminate age right tibial/peroneal trunk, left popliteal Hypertension  -Continue verapamil  -Add hydralazine 25mg  bid DVT bilateral lower extremities  -Indeterminate age  -Discussed with Dr. Arbutus Ped, no need for anticoagulation at this time especially in light of the patient's recent GI bleed  Discharge Condition: Stable  Disposition: Skilled nursing facility  Diet: Carbohydrate modified  Wt Readings from Last 3 Encounters:  06/22/13 55.929 kg (123 lb 4.8 oz)  06/15/13 54.432 kg (120 lb)  06/04/13 54.885 kg (121 lb)    History of present illness:  73 year old female presents with a history of COPD, bronchiectasis, diabetes mellitus, hypertension, hyperlipidemia presents with 24-hour history of generalized weakness, cough, congestion. The patient stated that she heard from her head to toes. The patient's husband stated that he could no longer take care of the patient. Upon admission, the patient was noted to have renal insufficiency above her baseline. She was also hypokalemic and hypercalcemic. The patient was started on intravenous fluids. Her hypercalcemia improved. In addition, the patient's renal function improved. Her generalized weakness improved. Physical therapy was consulted. They recommended skilled nursing facility. The patient's husband still was hesitant about sending the patient home given her weakness. She was found to have lactic acidosis. This was partly contributed by the patient's metformin. This will be discontinued altogether. Repeat blood gas was improved with hydration. Troponins were negative x2. TSH, serum B12, RBC folate, CPK were nondiagnostic. The patient's blood pressure improved with addition of hydralazine. The patient was found to have indeterminate age DVTs in her bilateral lower extremities. The case was discussed with Dr. Arbutus Ped. He did not feel that the patient required any anticoagulation at this time. The patient  did not have any tachycardia or hypoxemia. Clinical suspicion for PE was low. For  the patient's right shoulder pain. X-ray of the right shoulder was obtained. 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 This likely contributed to the patient's right upper extremity discomfort and pain. The patient remained neurovascularly intact.    Discharge Exam: Filed Vitals:   06/23/13 0853  BP: 147/72  Pulse: 71  Temp: 98.9 F (37.2 C)  Resp: 18   Filed Vitals:   06/22/13 2040 06/22/13 2100 06/23/13 0543 06/23/13 0853  BP:  140/75 123/79 147/72  Pulse:  98 84 71  Temp:  99.4 F (37.4 C) 99.1 F (37.3 C) 98.9 F (37.2 C)  TempSrc:  Oral Oral Oral  Resp:  20 16 18   Height:      Weight:  55.929 kg (123 lb 4.8 oz)    SpO2: 98% 100% 99% 100%   General: A&O x 3, NAD, pleasant, cooperative Cardiovascular: RRR, no rub, no gallop, no S3 Respiratory: Bibasilar rales. No wheezing. Good air movement. Abdomen:soft, nontender, nondistended, positive bowel sounds Extremities: trace LE edema, No lymphangitis, no petechiae; right upper extremity capillary refill less than 2 seconds. Radial pulse palpable.  Discharge Instructions      Discharge Orders   Future Appointments Provider Department Dept Phone   06/29/2013 11:00 AM Baltazar Apo, PA-C [facility] [phone]   07/07/2013 10:15 AM Waymon Budge, MD [facility] [phone]   Future Orders Complete By Expires   Diet - low sodium heart healthy  As directed    Increase activity slowly  As directed        Medication List    STOP taking these medications       ALLEGRA-D 12 HOUR 60-120 MG per tablet  Generic drug:  fexofenadine-pseudoephedrine  Replaced by:  loratadine 10 MG tablet     chlorpheniramine-HYDROcodone 10-8 MG/5ML Lqcr  Commonly known as:  TUSSIONEX     furosemide 40 MG tablet  Commonly known as:  LASIX     levofloxacin 500 MG tablet  Commonly known as:   LEVAQUIN     potassium chloride SA 20 MEQ tablet  Commonly known as:  K-DUR,KLOR-CON     sitaGLIPtin-metformin 50-500 MG per tablet  Commonly known as:  JANUMET      TAKE these medications       albuterol (5 MG/ML) 0.5% nebulizer solution  Commonly known as:  PROVENTIL  Take 0.5 mLs (2.5 mg total) by nebulization every 2 (two) hours as needed for wheezing or shortness of breath.     albuterol 108 (90 BASE) MCG/ACT inhaler  Commonly known as:  PROAIR HFA  Inhale 2 puffs into the lungs every 6 (six) hours as needed for wheezing or shortness of breath.     benzonatate 100 MG capsule  Commonly known as:  TESSALON  Take 100 mg by mouth 2 (two) times daily.     cholecalciferol 1000 UNITS tablet  Commonly known as:  VITAMIN D  Take 1,000 Units by mouth daily.     donepezil 5 MG tablet  Commonly known as:  ARICEPT  Take 5 mg by mouth at bedtime.     feeding supplement (ENSURE COMPLETE) Liqd  Take 237 mLs by mouth 2 (two) times daily between meals.     hydrALAZINE 25 MG tablet  Commonly known as:  APRESOLINE  Take 1 tablet (25 mg total) by mouth every 12 (twelve) hours.     IRON PO  Take 1 tablet by mouth daily.     loratadine 10 MG  tablet  Commonly known as:  CLARITIN  Take 1 tablet (10 mg total) by mouth daily.     multivitamin capsule  Take 1 capsule by mouth daily.     oxyCODONE-acetaminophen 5-325 MG per tablet  Commonly known as:  PERCOCET/ROXICET  Take 1 tablet by mouth every 6 (six) hours as needed for severe pain.     simvastatin 20 MG tablet  Commonly known as:  ZOCOR  Take 20 mg by mouth at bedtime.     sitaGLIPtin 50 MG tablet  Commonly known as:  JANUVIA  Take 1 tablet (50 mg total) by mouth daily.     St Johns Wort 300 MG Caps  Take 300 mg by mouth daily.     traMADol 50 MG tablet  Commonly known as:  ULTRAM  Take 50 mg by mouth daily as needed for moderate pain.     TYLENOL 500 MG tablet  Generic drug:  acetaminophen  Take 500 mg by mouth  every 6 (six) hours as needed. For pain     venlafaxine 37.5 MG tablet  Commonly known as:  EFFEXOR  Take 37.5 mg by mouth daily as needed (hot flashes).     verapamil 240 MG (CO) 24 hr tablet  Commonly known as:  COVERA HS  Take 240 mg by mouth at bedtime.     vitamin C 500 MG tablet  Commonly known as:  ASCORBIC ACID  Take 500 mg by mouth daily.         The results of significant diagnostics from this hospitalization (including imaging, microbiology, ancillary and laboratory) are listed below for reference.    Significant Diagnostic Studies: Dg Chest 2 View  06/20/2013   CLINICAL DATA:  Cough.  Pain.  EXAM: CHEST  2 VIEW  COMPARISON:  12/09/2012  FINDINGS: Cardiac silhouette is normal in size. Normal mediastinal and hilar contours  Mild medial left lung base opacity, likely atelectasis. No convincing infiltrate. No edema. No pleural effusion or pneumothorax.  Bony thorax is demineralized but grossly intact.  IMPRESSION: No acute cardiopulmonary disease.   Electronically Signed   By: Amie Portland M.D.   On: 06/20/2013 19:15   Dg Shoulder Right  06/22/2013   CLINICAL DATA:  Right shoulder pain with limited range of motion.  EXAM: RIGHT SHOULDER - 2+ VIEW  COMPARISON:  Radiographs dated 06/15/2013 and 09/29/2004  FINDINGS: The patient has a large chronic full-thickness rotator cuff tear. The humeral head articulates with the undersurface of the acromion. There are slight degenerative changes of the glenohumeral joint. No acute osseous abnormalities.  IMPRESSION: Chronic degenerative changes of the right shoulder.   Electronically Signed   By: Geanie Cooley M.D.   On: 06/22/2013 22:38   Dg Shoulder Right  06/15/2013   CLINICAL DATA:  Proximal humeral pain.  Limited range of motion.  EXAM: RIGHT SHOULDER - 2+ VIEW  COMPARISON:  DG FINGER INDEX 2+V*R* dated 09/16/2012; MR EXTREM UP JT*R* W/O CM dated 09/29/2004; US VENOUS IMAGING UNILATERAL*R* dated 01/05/2005  FINDINGS: Degenerative glenohumeral  arthropathy noted with spurring of the acromioclavicular joint. No dislocation or acute fracture.  IMPRESSION: 1. No acute bony findings. 2. Degenerative glenohumeral arthropathy. Degenerative AC joint arthropathy.   Electronically Signed   By: Herbie Baltimore M.D.   On: 06/15/2013 16:26   Mr Cervical Spine Wo Contrast  06/21/2013   CLINICAL DATA:  Neck pain and back pain.  Spinal stenosis.  EXAM: MRI CERVICAL AND LUMBAR SPINE WITHOUT CONTRAST  TECHNIQUE: Multiplanar and multiecho  pulse sequences of the cervical spine, to include the craniocervical junction and cervicothoracic junction, and lumbar spine, were obtained without intravenous contrast.  COMPARISON:  DG LUMBAR SPINE 2-3 VIEWS dated 12/10/2012; MR L SPINE W/O dated 09/18/2012; MR HEAD W/O CM dated 12/10/2012  FINDINGS: MRI CERVICAL SPINE FINDINGS  There is mild reversal of the normal cervical lordosis. There is grade 1 anterolisthesis of C3 on C4, and there is grade 1 retrolisthesis of C5 on C6 and of C6 on C7. There is mild anterior vertebral body height loss at C5. There is severe disc space narrowing at C5-6 greater than C6-7, with associated degenerative marrow changes. Patchy T2 hyperintensity is partially visualized in the brainstem, likely not significantly changed from the prior brain MRI and compatible with chronic microvascular ischemic change. The cervical spinal cord is normal in caliber and signal. Visualized paraspinal soft tissues are unremarkable.  C2-3:  Mild right facet arthrosis without stenosis.  C3-4: Broad-based posterior disc osteophyte complex and facet hypertrophy result in mild left neural foraminal narrowing. No spinal canal stenosis.  C4-5: Broad-based posterior disc osteophyte complex and facet hypertrophy result in mild bilateral neural foraminal narrowing. No spinal canal stenosis.  C5-6: Broad-based posterior disc osteophyte complex and facet hypertrophy result in moderate bilateral neural foraminal stenosis without spinal  canal stenosis.  C6-7: Disc bulge and uncovertebral hypertrophy result in mild left neural foraminal stenosis without spinal canal stenosis.  C7-T1:  Facet hypertrophy without stenosis.  The upper thoracic spine is only imaged sagittally and demonstrates small right-sided disc protrusions from T2-3 to T4-5 without evidence of stenosis.  MRI LUMBAR SPINE FINDINGS  Transitional lumbosacral anatomy is again seen. The numbering employed on the prior MRI will be continued on this examination, with the transitional level labeled S1 and the last fully formed intervertebral disc space being S1-2.  S shaped thoracolumbar scoliosis is again seen. Grade 1 anterolisthesis of L5 on S1 is unchanged. Prominent multilevel disc space narrowing is seen, greatest at L5-S1. Multilevel degenerative marrow changes are present, including mild degenerative marrow edema at T12-L1. The conus medullaris is normal in signal and terminates at L2-3. The visualized paraspinal soft tissues are unremarkable.  T12-L1: Only imaged sagittally. Broad-based disc and osteophyte complex eccentric to the left and moderate left facet arthrosis likely result in mild left neural foraminal narrowing, grossly unchanged.  L1-2: Disc bulge and endplate spurring eccentric to the left and facet hypertrophy result in mild spinal stenosis and mild left neural foraminal stenosis, unchanged.  L2-3: Circumferential disc bulge and facet and ligamentum flavum hypertrophy result in right greater than left lateral recess narrowing, mild to moderate spinal stenosis, and mild bilateral neural foraminal narrowing, unchanged.  L3-4: Disc bulge and facet and ligamentum flavum hypertrophy result in moderate spinal stenosis and mild right neural foraminal narrowing, unchanged.  L4-5: Disc bulge and severe facet and ligamentum flavum hypertrophy result in severe spinal stenosis and severe right neural foraminal stenosis, unchanged.  L5-S1: Listhesis with uncovering of the disc,  disc bulging, and severe facet and ligamentum flavum hypertrophy result in severe spinal stenosis and severe left neural foraminal stenosis, unchanged.  S1-2: Moderate facet hypertrophy results in mild right neural foraminal narrowing, unchanged. No spinal stenosis.  IMPRESSION: 1. Multilevel degenerative disc disease and facet arthrosis in the cervical spine with mild to moderate neural foraminal narrowing as above, greatest at C5-6. No cervical spinal stenosis. 2. Thoracolumbar scoliosis with advanced multilevel degenerative disc disease and facet arthrosis in the lumbar spine, not significantly changed from the  prior study. Spinal stenosis is severe at L4-5 and L5-S1.   Electronically Signed   By: Sebastian Ache   On: 06/21/2013 20:36   Mr Lumbar Spine Wo Contrast  06/21/2013   CLINICAL DATA:  Neck pain and back pain.  Spinal stenosis.  EXAM: MRI CERVICAL AND LUMBAR SPINE WITHOUT CONTRAST  TECHNIQUE: Multiplanar and multiecho pulse sequences of the cervical spine, to include the craniocervical junction and cervicothoracic junction, and lumbar spine, were obtained without intravenous contrast.  COMPARISON:  DG LUMBAR SPINE 2-3 VIEWS dated 12/10/2012; MR L SPINE W/O dated 09/18/2012; MR HEAD W/O CM dated 12/10/2012  FINDINGS: MRI CERVICAL SPINE FINDINGS  There is mild reversal of the normal cervical lordosis. There is grade 1 anterolisthesis of C3 on C4, and there is grade 1 retrolisthesis of C5 on C6 and of C6 on C7. There is mild anterior vertebral body height loss at C5. There is severe disc space narrowing at C5-6 greater than C6-7, with associated degenerative marrow changes. Patchy T2 hyperintensity is partially visualized in the brainstem, likely not significantly changed from the prior brain MRI and compatible with chronic microvascular ischemic change. The cervical spinal cord is normal in caliber and signal. Visualized paraspinal soft tissues are unremarkable.  C2-3:  Mild right facet arthrosis without  stenosis.  C3-4: Broad-based posterior disc osteophyte complex and facet hypertrophy result in mild left neural foraminal narrowing. No spinal canal stenosis.  C4-5: Broad-based posterior disc osteophyte complex and facet hypertrophy result in mild bilateral neural foraminal narrowing. No spinal canal stenosis.  C5-6: Broad-based posterior disc osteophyte complex and facet hypertrophy result in moderate bilateral neural foraminal stenosis without spinal canal stenosis.  C6-7: Disc bulge and uncovertebral hypertrophy result in mild left neural foraminal stenosis without spinal canal stenosis.  C7-T1:  Facet hypertrophy without stenosis.  The upper thoracic spine is only imaged sagittally and demonstrates small right-sided disc protrusions from T2-3 to T4-5 without evidence of stenosis.  MRI LUMBAR SPINE FINDINGS  Transitional lumbosacral anatomy is again seen. The numbering employed on the prior MRI will be continued on this examination, with the transitional level labeled S1 and the last fully formed intervertebral disc space being S1-2.  S shaped thoracolumbar scoliosis is again seen. Grade 1 anterolisthesis of L5 on S1 is unchanged. Prominent multilevel disc space narrowing is seen, greatest at L5-S1. Multilevel degenerative marrow changes are present, including mild degenerative marrow edema at T12-L1. The conus medullaris is normal in signal and terminates at L2-3. The visualized paraspinal soft tissues are unremarkable.  T12-L1: Only imaged sagittally. Broad-based disc and osteophyte complex eccentric to the left and moderate left facet arthrosis likely result in mild left neural foraminal narrowing, grossly unchanged.  L1-2: Disc bulge and endplate spurring eccentric to the left and facet hypertrophy result in mild spinal stenosis and mild left neural foraminal stenosis, unchanged.  L2-3: Circumferential disc bulge and facet and ligamentum flavum hypertrophy result in right greater than left lateral recess  narrowing, mild to moderate spinal stenosis, and mild bilateral neural foraminal narrowing, unchanged.  L3-4: Disc bulge and facet and ligamentum flavum hypertrophy result in moderate spinal stenosis and mild right neural foraminal narrowing, unchanged.  L4-5: Disc bulge and severe facet and ligamentum flavum hypertrophy result in severe spinal stenosis and severe right neural foraminal stenosis, unchanged.  L5-S1: Listhesis with uncovering of the disc, disc bulging, and severe facet and ligamentum flavum hypertrophy result in severe spinal stenosis and severe left neural foraminal stenosis, unchanged.  S1-2: Moderate facet hypertrophy results  in mild right neural foraminal narrowing, unchanged. No spinal stenosis.  IMPRESSION: 1. Multilevel degenerative disc disease and facet arthrosis in the cervical spine with mild to moderate neural foraminal narrowing as above, greatest at C5-6. No cervical spinal stenosis. 2. Thoracolumbar scoliosis with advanced multilevel degenerative disc disease and facet arthrosis in the lumbar spine, not significantly changed from the prior study. Spinal stenosis is severe at L4-5 and L5-S1.   Electronically Signed   By: Sebastian Ache   On: 06/21/2013 20:36   Ct Abdomen Pelvis W Contrast  06/04/2013   CLINICAL DATA:  Pain.  EXAM: CT ABDOMEN AND PELVIS WITH CONTRAST  TECHNIQUE: Multidetector CT imaging of the abdomen and pelvis was performed using the standard protocol following bolus administration of intravenous contrast.  CONTRAST:  80mL OMNIPAQUE IOHEXOL 300 MG/ML  SOLN  COMPARISON:  CT ABD/PELVIS W CM dated 10/08/2011  FINDINGS: Included view of the lung bases demonstrates patchy consolidation in the left lung base with bronchiectasis and scarring. The heart and pericardium are unremarkable.  Moderate hiatal hernia. Remainder of the stomach is unremarkable. Small and large bowel are normal in course and caliber without inflammatory changes. No gastric wall thickening on today's  examination. Contrast as yet to reach the large bowel.  14 mm cyst in the segment 7 of the liver, unchanged. The liver is otherwise unremarkable. The gallbladder, spleen, adrenal glands are nonsuspicious. Atrophic pancreas is unchanged.  No intraperitoneal free fluid nor free air. 2 mm cortical based right renal interpolar calcification. Additional too small to characterize hypodensities in the right kidney, the kidneys are otherwise unremarkable. Delayed imaging demonstrates prompt symmetric excretion into the proximal urinary collecting systems. Aortoiliac vessels are normal in course and caliber with mild calcific atherosclerosis. Status post hysterectomy. Urinary bladder is partially distended unremarkable.  Soft tissues are unremarkable. Grade 2 L4-5 anterolisthesis without pars interarticularis defects. Moderate L4-5 canal stenosis and moderate to severe neural foraminal narrowing at this level. Moderate to severe L3-4 neural foraminal narrowing. Small fat containing umbilical hernia.  IMPRESSION: Patchy consolidation in the left lung base concerning for pneumonia, in a background of bronchiectasis and scarring.  No acute intra-abdominal or pelvic process. No gastric wall thickening on today's examination.  Moderate hiatal hernia.   Electronically Signed   By: Awilda Metro   On: 06/04/2013 01:45     Microbiology: No results found for this or any previous visit (from the past 240 hour(s)).   Labs: Basic Metabolic Panel:  Recent Labs Lab 06/20/13 1929 06/21/13 0520 06/22/13 0647 06/23/13 0813  NA 140 137 144 139  K 3.0* 3.0* 3.5* 4.3  CL 96 99 107 104  CO2 27 23 24 20   GLUCOSE 139* 110* 113* 135*  BUN 17 12 10 9   CREATININE 1.05 0.82 0.85 0.71  CALCIUM 10.2 9.2 9.0 9.1  MG  --   --  1.2* 1.7   Liver Function Tests:  Recent Labs Lab 06/20/13 1929  AST 21  ALT 14  ALKPHOS 116  BILITOT <0.2*  PROT 7.5  ALBUMIN 2.6*   No results found for this basename: LIPASE, AMYLASE,  in  the last 168 hours No results found for this basename: AMMONIA,  in the last 168 hours CBC:  Recent Labs Lab 06/20/13 1929 06/21/13 0520 06/22/13 0647  WBC 9.5 9.8 9.3  NEUTROABS 6.0  --   --   HGB 12.9 11.0* 11.0*  HCT 41.2 33.1* 32.7*  MCV 87.5 86.2 86.5  PLT 299 303 283   Cardiac  Enzymes:  Recent Labs Lab 06/20/13 1929 06/21/13 0850  CKTOTAL  --  49  TROPONINI <0.30 <0.30   BNP: No components found with this basename: POCBNP,  CBG:  Recent Labs Lab 06/21/13 1128 06/21/13 2105 06/22/13 0731 06/22/13 1235 06/22/13 1656  GLUCAP 139* 144* 104* 120* 133*    Time coordinating discharge:  Greater than 30 minutes  Signed:  Khamarion Bjelland, DO [facility] Pager: [phone] 06/23/2013, 10:31 AM

## 2013-06-23 NOTE — Progress Notes (Signed)
[facility]ired [facility] with [facility]; no one answer who could receive report. Will have  SW call ambulance service to transfer pt to facility.

## 2013-06-23 NOTE — Clinical Social Work Placement (Addendum)
Clinical Social Work Department CLINICAL SOCIAL WORK PLACEMENT NOTE 06/23/2013  Patient:  Tiajuana AmassSMITH,Zianne L  Account Number:  [id number] Admit date:  06/20/2013  Clinical Social Worker:  Genelle BalVANESSA CRAWFORD, LCSW  Date/time:  06/23/2013 11:26 AM  Clinical Social Work is seeking post-discharge placement for this patient at the following level of care:   SKILLED NURSING   (*CSW will update this form in Epic as items are completed)   06/23/2013  Patient/family provided with [facility] of Clinical [facility] Work's list of facilities offering this level of care within the geographic area requested by the patient (or if unable, by the patient's family).  06/23/2013  Patient/family informed of their freedom to choose among providers that offer the needed level of care, that participate in Medicare, Medicaid or managed care program needed by the patient, have an available bed and are willing to accept the patient.    Patient/family informed of [facility]' ownership interest in [facility], as well as of the fact that they are under no obligation to receive care at this facility.  PASARR submitted to EDS on 06/23/2013 PASARR number received from EDS on 06/23/2013  FL2 transmitted to all facilities in geographic area requested by pt/family on   FL2 transmitted to all facilities within larger geographic area on   Patient informed that his/her managed care company has contracts with or will negotiate with  certain facilities, including the following:     Patient/family informed of bed offers received:06/23/2013. [facility]. Physician recommends and patient chooses bed at    [facility]. Patient to be transferred to facility by ambulance.  The following physician request were entered in Epic:   Additional Comments:   Deniece ReeBrianna Brae Schaafsma, CSW Intern.

## 2013-06-24 ENCOUNTER — Other Ambulatory Visit: Payer: Self-pay | Admitting: *Deleted

## 2013-06-24 ENCOUNTER — Non-Acute Institutional Stay (SKILLED_NURSING_FACILITY): Payer: Medicare Other | Admitting: Internal Medicine

## 2013-06-24 ENCOUNTER — Encounter: Payer: Self-pay | Admitting: Internal Medicine

## 2013-06-24 DIAGNOSIS — R5381 Other malaise: Secondary | ICD-10-CM | POA: Insufficient documentation

## 2013-06-24 DIAGNOSIS — F0390 Unspecified dementia without behavioral disturbance: Secondary | ICD-10-CM

## 2013-06-24 DIAGNOSIS — I82403 Acute embolism and thrombosis of unspecified deep veins of lower extremity, bilateral: Secondary | ICD-10-CM | POA: Insufficient documentation

## 2013-06-24 DIAGNOSIS — R531 Weakness: Secondary | ICD-10-CM

## 2013-06-24 DIAGNOSIS — J449 Chronic obstructive pulmonary disease, unspecified: Secondary | ICD-10-CM

## 2013-06-24 DIAGNOSIS — M549 Dorsalgia, unspecified: Secondary | ICD-10-CM

## 2013-06-24 DIAGNOSIS — I10 Essential (primary) hypertension: Secondary | ICD-10-CM

## 2013-06-24 DIAGNOSIS — R5383 Other fatigue: Secondary | ICD-10-CM

## 2013-06-24 DIAGNOSIS — E785 Hyperlipidemia, unspecified: Secondary | ICD-10-CM | POA: Insufficient documentation

## 2013-06-24 DIAGNOSIS — J479 Bronchiectasis, uncomplicated: Secondary | ICD-10-CM | POA: Insufficient documentation

## 2013-06-24 DIAGNOSIS — I82409 Acute embolism and thrombosis of unspecified deep veins of unspecified lower extremity: Secondary | ICD-10-CM

## 2013-06-24 DIAGNOSIS — E119 Type 2 diabetes mellitus without complications: Secondary | ICD-10-CM

## 2013-06-24 DIAGNOSIS — E876 Hypokalemia: Secondary | ICD-10-CM

## 2013-06-24 MED ORDER — OXYCODONE-ACETAMINOPHEN 5-325 MG PO TABS: 1.0000 | ORAL_TABLET | Freq: Four times a day (QID) | ORAL | Status: DC | PRN

## 2013-06-24 MED ORDER — TRAMADOL HCL 50 MG PO TABS: ORAL_TABLET | ORAL | Status: DC

## 2013-06-24 NOTE — Assessment & Plan Note (Signed)
repleted ?

## 2013-06-24 NOTE — Assessment & Plan Note (Signed)
Improved with hydration 

## 2013-06-24 NOTE — Telephone Encounter (Signed)
[facility]  

## 2013-06-24 NOTE — Assessment & Plan Note (Signed)
A1c 5.5 ; glucophage d/c sec to lactic acidosis;continue januvia; will not use sliding scale for now;pt on januvia

## 2013-06-24 NOTE — Assessment & Plan Note (Signed)
Continue aricept and change effexor to daily, not prn

## 2013-06-24 NOTE — Assessment & Plan Note (Signed)
Continue verapamil and hydralazine

## 2013-06-24 NOTE — Assessment & Plan Note (Signed)
Multifactoral, possibly LBP, dementia -will benefit from OT/PT

## 2013-06-24 NOTE — Assessment & Plan Note (Signed)
Repleted. °

## 2013-06-24 NOTE — Assessment & Plan Note (Signed)
Presentation to ED along with husband saying he could not care for her at home; pt had neg W/u except for several electrolyte abnomalities

## 2013-06-24 NOTE — Assessment & Plan Note (Signed)
from severe spinal stenosis L4-5 and L5-S1- probably one of the causes of deconditioning and weakness

## 2013-06-24 NOTE — Assessment & Plan Note (Signed)
Continue zocor 20 mg daily 

## 2013-06-24 NOTE — Assessment & Plan Note (Signed)
OLD and anti -coag not rec;pot with vwry recent GI bleed

## 2013-06-24 NOTE — Progress Notes (Signed)
MRN: [id number] Name: Sheila Cisneros  Sex: female Age: 73 y.o. DOB: Aug 07, 1940  PSC #: [facility] Facility/Room:120 Level Of Care: SNF Provider: Merrilee Seashore D Emergency Contacts: Extended Emergency Contact Information Primary Emergency Contact: Corrin Parker of [place] Home Phone: [phone] Mobile Phone: [phone] Relation: Spouse Secondary Emergency Contact: Hamilton,John  [place] Home Phone: [phone] Relation: Brother    Allergies: Codeine; Neomycin; Penicillins; Tetanus toxoids; and Levofloxacin  Chief Complaint  Patient presents with  . nursing home admission    HPI: Patient is 73 y.o. female who is admitted to SNF for deconditioning and inability for ADLsalong with multiple medical problems for OT/PT.  Past Medical History  Diagnosis Date  . Gout, unspecified   . Other chronic sinusitis   . Unspecified chronic bronchitis     sees Dr. Melba Coon, last visit 08/2011, treated for E. Coli- resp. /w Ceftin, Feb. 2013  . Hypertension   . GERD (gastroesophageal reflux disease)     rolaids if needed  . Pneumonia     "couple times; long time ago" (12/09/2012)  . Chronic bronchitis     "couple times/yr" (12/09/2012)  . Exertional shortness of breath   . History of blood transfusion     "related to a surgery, I think" (12/09/2012)  . Hepatitis, unspecified     "the one that's not bad" (12/09/2012)  . Migraines     "in the past" (12/09/2012)  . Osteoarthrosis, unspecified whether generalized or localized, unspecified site   . Arthritis     "all over" (12/09/2012)  . Chronic lower back pain   . Altered mental status 12/09/2012  . Diabetes mellitus without complication   . COPD (chronic obstructive pulmonary disease)   . Hyperlipidemia     Past Surgical History  Procedure Laterality Date  . Tubal ligation    . Tonsillectomy    . Shoulder arthroscopy w/ rotator cuff repair Right   . Carpal tunnel release  09/06/2011   Procedure: CARPAL TUNNEL RELEASE;  Surgeon: Kennieth Rad, MD;  Location: [facility] OR;  Service: Orthopedics;  Laterality: Right;  . Abdominal hysterectomy      "w/right ovary" (12/09/2012)  . Appendectomy    . Breast biopsy Right   . Esophagogastroduodenoscopy N/A 06/04/2013    Procedure: ESOPHAGOGASTRODUODENOSCOPY (EGD);  Surgeon: Graylin Shiver, MD;  Location: [facility] ENDOSCOPY;  Service: Endoscopy;  Laterality: N/A;      Medication List       This list is accurate as of: 06/24/13 10:47 PM.  Always use your most recent med list.               albuterol (5 MG/ML) 0.5% nebulizer solution  Commonly known as:  PROVENTIL  Take 0.5 mLs (2.5 mg total) by nebulization every 2 (two) hours as needed for wheezing or shortness of breath.     albuterol 108 (90 BASE) MCG/ACT inhaler  Commonly known as:  PROAIR HFA  Inhale 2 puffs into the lungs every 6 (six) hours as needed for wheezing or shortness of breath.     benzonatate 100 MG capsule  Commonly known as:  TESSALON  Take 100 mg by mouth 2 (two) times daily.     cholecalciferol 1000 UNITS tablet  Commonly known as:  VITAMIN D  Take 1,000 Units by mouth daily.     donepezil 5 MG tablet  Commonly known as:  ARICEPT  Take 5 mg by mouth at bedtime.     feeding supplement (ENSURE COMPLETE) Liqd  Take 237 mLs by mouth 2 (two) times daily between meals.     hydrALAZINE 25 MG tablet  Commonly known as:  APRESOLINE  Take 1 tablet (25 mg total) by mouth every 12 (twelve) hours.     IRON PO  Take 1 tablet by mouth daily.     loratadine 10 MG tablet  Commonly known as:  CLARITIN  Take 1 tablet (10 mg total) by mouth daily.     multivitamin capsule  Take 1 capsule by mouth daily.     oxyCODONE-acetaminophen 5-325 MG per tablet  Commonly known as:  PERCOCET/ROXICET  Take 1 tablet by mouth every 6 (six) hours as needed for severe pain.     simvastatin 20 MG tablet  Commonly known as:  ZOCOR  Take 20 mg by mouth at bedtime.      sitaGLIPtin 50 MG tablet  Commonly known as:  JANUVIA  Take 1 tablet (50 mg total) by mouth daily.     St Johns Wort 300 MG Caps  Take 300 mg by mouth daily.     traMADol 50 MG tablet  Commonly known as:  ULTRAM  Take one tablet by mouth once daily as needed for moderate pain     TYLENOL 500 MG tablet  Generic drug:  acetaminophen  Take 500 mg by mouth every 6 (six) hours as needed. For pain     venlafaxine 37.5 MG tablet  Commonly known as:  EFFEXOR  Take 37.5 mg by mouth daily as needed (hot flashes).     verapamil 240 MG (CO) 24 hr tablet  Commonly known as:  COVERA HS  Take 240 mg by mouth at bedtime.     vitamin C 500 MG tablet  Commonly known as:  ASCORBIC ACID  Take 500 mg by mouth daily.        No orders of the defined types were placed in this encounter.    Immunization History  Administered Date(s) Administered  . Influenza Split 01/04/2011, 12/26/2011  . Influenza Whole 01/31/2009, 01/14/2010  . Influenza,inj,Quad PF,36+ Mos 02/17/2013  . Pneumococcal Polysaccharide-23 01/30/2008    History  Substance Use Topics  . Smoking status: Never Smoker   . Smokeless tobacco: Never Used  . Alcohol Use: Yes     Comment: 12/09/2012 "have a taste yearly"    Family history is noncontributory    Review of Systems  DATA OBTAINED: from patient; no c/o GENERAL: Feels well no fevers, fatigue, appetite changes SKIN: No itching, rash or wounds EYES: No eye pain, redness, discharge EARS: No earache, tinnitus, change in hearing NOSE: No congestion, drainage or bleeding  MOUTH/THROAT: No mouth or tooth pain, No sore throat  RESPIRATORY: No cough, wheezing, SOB CARDIAC: No chest pain, palpitations, lower extremity edema  GI: No abdominal pain, No N/V/D or constipation, No heartburn or reflux  GU: No dysuria, frequency or urgency, or incontinence  MUSCULOSKELETAL: No unrelieved bone/joint pain NEUROLOGIC: No headache, dizziness or focal weakness PSYCHIATRIC: No overt  anxiety or sadness. Sleeps well. No behavior issue.   Filed Vitals:   06/24/13 2227  BP: 155/89  Pulse: 89  Temp: 100.9 F (38.3 C)  Resp: 20    Physical Exam  GENERAL APPEARANCE: Alert, conversant. Appropriately groomed. No acute distress.  SKIN: No diaphoresis rash, or wounds HEAD: Normocephalic, atraumatic  EYES: Conjunctiva/lids clear. Pupils round, reactive. EOMs intact.  EARS: External exam WNL, canals clear. Hearing grossly normal.  NOSE: No deformity or discharge.  MOUTH/THROAT: Lips w/o lesions RESPIRATORY: Breathing  is even, unlabored. Lung sounds are clear   CARDIOVASCULAR: Heart RRR no murmurs, rubs or gallops. No peripheral edema.  GASTROINTESTINAL: Abdomen is soft, non-tender, not distended w/ normal bowel sounds GENITOURINARY: Bladder non tender, not distended  MUSCULOSKELETAL: No abnormal joints or musculature NEUROLOGIC: Oriented X3. Cranial nerves 2-12 grossly intact. Moves all extremities no tremor. PSYCHIATRIC: Mood and affect appropriate to situation, no behavioral issues  Patient Active Problem List   Diagnosis Date Noted  . Physical deconditioning 06/24/2013  . DVT, bilateral lower limbs 06/24/2013  . COPD (chronic obstructive pulmonary disease)   . Hyperlipidemia   . Hypomagnesemia 06/22/2013  . Weakness 06/21/2013  . Diabetes mellitus 06/21/2013  . Lactic acidosis 06/21/2013  . Unspecified protein-calorie malnutrition 06/21/2013  . Hypercalcemia 06/21/2013  . Hyperkalemia 06/21/2013  . Chest pain 06/21/2013  . Hypokalemia 06/20/2013  . Rectal bleeding 06/04/2013  . Anemia associated with acute blood loss 06/04/2013  . Abdominal pain, acute, right lower quadrant 06/04/2013  . GIB (gastrointestinal bleeding) 06/04/2013  . Idiopathic scoliosis 12/12/2012  . Scoliosis (and kyphoscoliosis), idiopathic 12/12/2012  . Leukocytosis, unspecified 12/10/2012  . Dementia without behavioral disturbance 12/10/2012  . Confusion 12/09/2012  . Hyperglycemia  12/09/2012  . Dehydration 12/09/2012  . Bronchiectasis without acute exacerbation 12/09/2012  . GERD (gastroesophageal reflux disease) 12/09/2012  . Benign hypertension 12/09/2012  . Back pain 11/09/2012  . GOUT 03/07/2007  . HYPERTENSION 03/07/2007  . 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 Bronchitis/ bronchiectasis, chronic obstructive w acute bronchitis 03/07/2007  . HEPATITIS 03/07/2007  . UNSPECIFIED CELLULITIS AND ABSCESS OF TOE 03/07/2007  . OSTEOARTHRITIS 03/07/2007    CBC    Component Value Date/Time   WBC 9.3 06/22/2013 0647   RBC 3.78* 06/22/2013 0647   HGB 11.0* 06/22/2013 0647   HCT 32.7* 06/22/2013 0647   PLT 283 06/22/2013 0647   MCV 86.5 06/22/2013 0647   LYMPHSABS 2.4 06/20/2013 1929   MONOABS 0.9 06/20/2013 1929   EOSABS 0.1 06/20/2013 1929   BASOSABS 0.0 06/20/2013 1929    CMP     Component Value Date/Time   NA 139 06/23/2013 0813   K 4.3 06/23/2013 0813   CL 104 06/23/2013 0813   CO2 20 06/23/2013 0813   GLUCOSE 135* 06/23/2013 0813   BUN 9 06/23/2013 0813   CREATININE 0.71 06/23/2013 0813   CALCIUM 9.1 06/23/2013 0813   PROT 7.5 06/20/2013 1929   ALBUMIN 2.6* 06/20/2013 1929   AST 21 06/20/2013 1929   ALT 14 06/20/2013 1929   ALKPHOS 116 06/20/2013 1929   BILITOT <0.2* 06/20/2013 1929   GFRNONAA 84* 06/23/2013 0813   GFRAA >90 06/23/2013 0813    Assessment and Plan  Weakness Presentation to ED along with husband saying he could not care for her at home; pt had neg W/u except for several electrolyte abnomalities  Back pain from severe spinal stenosis L4-5 and L5-S1- probably one of the causes of deconditioning and weakness  Hypokalemia Repleted  Hypercalcemia Improved with hydration  Hypomagnesemia repleted  Dementia without behavioral disturbance Continue aricept and change effexor to daily, not prn  Diabetes mellitus A1c 5.5 ; glucophage d/c sec to lactic acidosis;continue januvia; will not use sliding scale for now;pt on januvia  HYPERTENSION Continue verapamil  and hydralazine  COPD (chronic obstructive pulmonary disease) continue albuterol prn  Physical deconditioning Multifactoral, possibly LBP, dementia -will benefit from OT/PT  DVT, bilateral lower limbs OLD and anti -coag not rec;pot with vwry recent GI bleed  Hyperlipidemia Continue zocor 20 mg daily  Hennie Duos, MD

## 2013-06-24 NOTE — Assessment & Plan Note (Signed)
continue albuterol prn

## 2013-06-29 ENCOUNTER — Ambulatory Visit: Payer: Medicare Other | Admitting: Physician Assistant

## 2013-07-07 ENCOUNTER — Non-Acute Institutional Stay (SKILLED_NURSING_FACILITY): Payer: Medicare Other | Admitting: Nurse Practitioner

## 2013-07-07 ENCOUNTER — Ambulatory Visit: Payer: Medicare Other | Admitting: Internal Medicine

## 2013-07-07 DIAGNOSIS — J449 Chronic obstructive pulmonary disease, unspecified: Secondary | ICD-10-CM

## 2013-07-07 DIAGNOSIS — R5381 Other malaise: Secondary | ICD-10-CM

## 2013-07-07 DIAGNOSIS — D62 Acute posthemorrhagic anemia: Secondary | ICD-10-CM

## 2013-07-07 DIAGNOSIS — E785 Hyperlipidemia, unspecified: Secondary | ICD-10-CM

## 2013-07-07 DIAGNOSIS — M549 Dorsalgia, unspecified: Secondary | ICD-10-CM

## 2013-07-07 DIAGNOSIS — E119 Type 2 diabetes mellitus without complications: Secondary | ICD-10-CM

## 2013-07-07 DIAGNOSIS — F0390 Unspecified dementia without behavioral disturbance: Secondary | ICD-10-CM

## 2013-07-07 DIAGNOSIS — I10 Essential (primary) hypertension: Secondary | ICD-10-CM

## 2013-07-07 NOTE — Progress Notes (Signed)
Patient ID: Sheila Cisneros, female   DOB: 01/08/1941, 73 y.o.   MRN: [id number]    Nursing Home Location:  [facility]   Place of Service: SNF (31)  PCP: Alva Garnet., MD  Allergies  Allergen Reactions  . Codeine Rash     angioedema  . Neomycin Itching  . Penicillins Itching and Rash       . Tetanus Toxoids Rash  . Levofloxacin Rash    Chief Complaint  Patient presents with  . Discharge Note    HPI:  Patient is 73 y.o. Female with pmh of protein calorie malnutrition, COPD, anemia, hx of DVT, HTN, hyperlipidemia, spinal stenosis with chronic pain  who is admitted to SNF after hospitalization for deconditioning and inability for ADLs for OT/PT. Pt still with poor PO intake but this has improved but not significantly, pt has not had good oral intake for a while per family, does not think food at [employer] taste good. Patient currently doing well with therapy, now stable to discharge home with home health services and family assistance.  Review of Systems:  Review of Systems  Constitutional: Negative for fever and chills.  Respiratory: Positive for cough and shortness of breath.        Hx of COPD no worsening of symptoms   Cardiovascular: Negative for chest pain and leg swelling.  Gastrointestinal: Negative for heartburn, abdominal pain, diarrhea and constipation.  Genitourinary: Negative for dysuria.  Musculoskeletal: Positive for back pain. Negative for myalgias.  Skin: Negative for itching and rash.  Neurological: Positive for weakness (has improved). Negative for dizziness.  Psychiatric/Behavioral: Positive for memory loss. Negative for depression. The patient is not nervous/anxious and does not have insomnia.      Past Medical History  Diagnosis Date  . Gout, unspecified   . Other chronic sinusitis   . Unspecified chronic bronchitis     sees Dr. Melba Coon, last visit 08/2011, treated for E. Coli- resp. /w Ceftin, Feb. 2013  . Hypertension   . GERD  (gastroesophageal reflux disease)     rolaids if needed  . Pneumonia     "couple times; long time ago" (12/09/2012)  . Chronic bronchitis     "couple times/yr" (12/09/2012)  . Exertional shortness of breath   . History of blood transfusion     "related to a surgery, I think" (12/09/2012)  . Hepatitis, unspecified     "the one that's not bad" (12/09/2012)  . Migraines     "in the past" (12/09/2012)  . Osteoarthrosis, unspecified whether generalized or localized, unspecified site   . Arthritis     "all over" (12/09/2012)  . Chronic lower back pain   . Altered mental status 12/09/2012  . Diabetes mellitus without complication   . COPD (chronic obstructive pulmonary disease)   . Hyperlipidemia    Past Surgical History  Procedure Laterality Date  . Tubal ligation    . Tonsillectomy    . Shoulder arthroscopy w/ rotator cuff repair Right   . Carpal tunnel release  09/06/2011    Procedure: CARPAL TUNNEL RELEASE;  Surgeon: Kennieth Rad, MD;  Location: [facility] OR;  Service: Orthopedics;  Laterality: Right;  . Abdominal hysterectomy      "w/right ovary" (12/09/2012)  . Appendectomy    . Breast biopsy Right   . Esophagogastroduodenoscopy N/A 06/04/2013    Procedure: ESOPHAGOGASTRODUODENOSCOPY (EGD);  Surgeon: Graylin Shiver, MD;  Location: [facility] ENDOSCOPY;  Service: Endoscopy;  Laterality: N/A;   Social History:  reports that she has never smoked. She has never used smokeless tobacco. She reports that she drinks alcohol. She reports that she does not use illicit drugs.  Family History  Problem Relation Age of Onset  . Heart disease Father   . ALS Brother     Medications: Patient's Medications  New Prescriptions   No medications on file  Previous Medications   ACETAMINOPHEN (TYLENOL) 500 MG TABLET    Take 500 mg by mouth every 6 (six) hours as needed. For pain   ALBUTEROL (PROAIR HFA) 108 (90 BASE) MCG/ACT INHALER    Inhale 2 puffs into the lungs every 6 (six) hours as needed for wheezing or  shortness of breath.   ALBUTEROL (PROVENTIL) (5 MG/ML) 0.5% NEBULIZER SOLUTION    Take 0.5 mLs (2.5 mg total) by nebulization every 2 (two) hours as needed for wheezing or shortness of breath.   ASCORBIC ACID (VITAMIN C) 500 MG TABLET    Take 500 mg by mouth daily.     BENZONATATE (TESSALON) 100 MG CAPSULE    Take 100 mg by mouth 2 (two) times daily.   CHOLECALCIFEROL (VITAMIN D) 1000 UNITS TABLET    Take 1,000 Units by mouth daily.   DONEPEZIL (ARICEPT) 5 MG TABLET    Take 5 mg by mouth at bedtime.   FEEDING SUPPLEMENT, ENSURE COMPLETE, (ENSURE COMPLETE) LIQD    Take 237 mLs by mouth 2 (two) times daily between meals.   HYDRALAZINE (APRESOLINE) 25 MG TABLET    Take 1 tablet (25 mg total) by mouth every 12 (twelve) hours.   IRON PO    Take 1 tablet by mouth daily.   LORATADINE (CLARITIN) 10 MG TABLET    Take 1 tablet (10 mg total) by mouth daily.   MULTIPLE VITAMIN (MULTIVITAMIN) CAPSULE    Take 1 capsule by mouth daily.     OXYCODONE-ACETAMINOPHEN (PERCOCET/ROXICET) 5-325 MG PER TABLET    Take 1 tablet by mouth every 6 (six) hours as needed for severe pain.   SIMVASTATIN (ZOCOR) 20 MG TABLET    Take 20 mg by mouth at bedtime.     SITAGLIPTIN (JANUVIA) 50 MG TABLET    Take 1 tablet (50 mg total) by mouth daily.   [place] WORT 300 MG CAPS    Take 300 mg by mouth daily.   TRAMADOL (ULTRAM) 50 MG TABLET    Take one tablet by mouth once daily as needed for moderate pain   VENLAFAXINE (EFFEXOR) 37.5 MG TABLET    Take 37.5 mg by mouth daily as needed (hot flashes).   VERAPAMIL (COVERA HS) 240 MG (CO) 24 HR TABLET    Take 240 mg by mouth at bedtime.    Modified Medications   No medications on file  Discontinued Medications   No medications on file     Physical Exam:  Filed Vitals:   07/07/13 1317  BP: 129/74  Pulse: 74  Temp: 97.4 F (36.3 C)  Resp: 18   Physical Exam  Constitutional: She is well-developed, well-nourished, and in no distress.  HENT:  Head: Normocephalic and  atraumatic.  Right Ear: External ear normal.  Left Ear: External ear normal.  Nose: Nose normal.  Mouth/Throat: Oropharynx is clear and moist. No oropharyngeal exudate.  Eyes: Conjunctivae and EOM are normal. Pupils are equal, round, and reactive to light.  Neck: Normal range of motion. Neck supple. No thyromegaly present.  Pulmonary/Chest: Effort normal. No respiratory distress. She has no wheezes. She has rales (throughout).  Abdominal: Soft. Bowel sounds are normal. She exhibits no distension. There is no tenderness.  Musculoskeletal: She exhibits no edema and no tenderness.  [facility]  Lymphadenopathy:    She has no cervical adenopathy.  Neurological: She is alert.  Skin: Skin is warm and dry.  Psychiatric: Affect normal.      Labs reviewed: Basic Metabolic Panel:  Recent Labs  16/01/9607/31/14 0525  06/21/13 0520 06/22/13 0647 06/23/13 0813  NA 136  < > 137 144 139  K 4.1  < > 3.0* 3.5* 4.3  CL 104  < > 99 107 104  CO2 23  < > 23 24 20   GLUCOSE 94  < > 110* 113* 135*  BUN 18  < > 12 10 9   CREATININE 0.94  < > 0.82 0.85 0.71  CALCIUM 9.2  < > 9.2 9.0 9.1  MG 1.7  --   --  1.2* 1.7  < > = values in this interval not displayed. Liver Function Tests:  Recent Labs  12/14/12 0525 06/03/13 2151 06/20/13 1929  AST 15 12 21   ALT 17 7 14   ALKPHOS 69 106 116  BILITOT 0.3 <0.2* <0.2*  PROT 6.2 7.3 7.5  ALBUMIN 2.7* 2.8* 2.6*   No results found for this basename: LIPASE, AMYLASE,  in the last 8760 hours  Recent Labs  12/09/12 1900  AMMONIA 12   CBC:  Recent Labs  12/13/12 0643 12/14/12 0525  06/20/13 1929 06/21/13 0520 06/22/13 0647  WBC 13.3* 16.0*  < > 9.5 9.8 9.3  NEUTROABS 8.3* 9.6*  --  6.0  --   --   HGB 10.7* 11.4*  < > 12.9 11.0* 11.0*  HCT 31.1* 33.3*  < > 41.2 33.1* 32.7*  MCV 91.7 92.2  < > 87.5 86.2 86.5  PLT 243 253  < > 299 303 283  < > = values in this interval not displayed. Cardiac Enzymes:  Recent Labs  12/09/12 1254  06/20/13 1929 06/21/13 0850  CKTOTAL  --   --  49  TROPONINI <0.30 <0.30 <0.30   BNP: No components found with this basename: POCBNP,  CBG:  Recent Labs  06/22/13 0731 06/22/13 1235 06/22/13 1656  GLUCAP 104* 120* 133*   TSH:  Recent Labs  12/09/12 1930 12/10/12 1738 06/21/13 0850  TSH 0.152* 0.677 2.063   A1C: Lab Results  Component Value Date   HGBA1C 6.0* 06/22/2013     Assessment/Plan 1. Back pain -due to severe spinal stenosis; pt has PRN oxy and tramadol   2. Hyperlipidemia -conts on zocor  3. Dementia without behavioral disturbance -will need ongoing out pt follow up -conts on aricept 5 mg daily; being discharged home with husband who takes care of pt with assistance from other family members  4. Diabetes mellitus -to continue januvia, metformin stopped in hospital due to acidosis   5. COPD (chronic obstructive pulmonary disease) -conts on albuterol as needed, follows with Dr Maple HudsonYoung for pulmonary   6. HYPERTENSION Patient is stable; continue current regimen.  7. Physical deconditioning pt is stable for discharge home with husband; will need PT/OT/HHA per home health. DME needed is a tub bench. Rx written.  will need to follow up with PCP within 2 weeks.   8. Anemia associated with acute blood loss secondary to history of GI bleed  -no signs of blood loss; will follow up blood work before discharge home  9. Anorexia -ongoing was going on at home prior to admission, contributing  to weakness per family Reports does not like the taste of food, education pt and family that she needs to eat, finding foods that she can tolerate is important, no pain in mouth trouble swallowing or abdominal pain or discomfort. Pt aware she needs to increase PO intake; to followup with this with her PCP

## 2013-07-21 ENCOUNTER — Inpatient Hospital Stay: Payer: Medicare Other | Admitting: Adult Health

## 2013-07-30 ENCOUNTER — Encounter: Payer: Self-pay | Admitting: Internal Medicine

## 2013-07-30 ENCOUNTER — Ambulatory Visit (INDEPENDENT_AMBULATORY_CARE_PROVIDER_SITE_OTHER): Payer: Medicare Other | Admitting: Internal Medicine

## 2013-07-30 ENCOUNTER — Encounter (INDEPENDENT_AMBULATORY_CARE_PROVIDER_SITE_OTHER): Payer: Self-pay

## 2013-07-30 VITALS — BP 122/66 | HR 85 | Ht 59.0 in | Wt 121.0 lb

## 2013-07-30 DIAGNOSIS — M4120 Other idiopathic scoliosis, site unspecified: Secondary | ICD-10-CM

## 2013-07-30 DIAGNOSIS — J479 Bronchiectasis, uncomplicated: Secondary | ICD-10-CM

## 2013-07-30 NOTE — Progress Notes (Signed)
Patient ID: Sheila Cisneros, female    DOB: [birth year], 73 y.o.   MRN: [id number] HPI  07/31/10- 70 yoF followed for chronic bronchitis/ bronchiectasis and hx rhinosinusitis. Never smoker. Last here- January 30, 2010. She remained congested throough the winter, but she put up with it. No events needing medical visits. Pollen now makes cough somewhat more constant. Rarely productive. Denies fever or night sweats. Nebulizer still helps, used about twice daily. Denies  Chest pain or headache.   09/05/10- Chronic bronchitis, bronchiectasis, hx rhinosinusitis Reports increased chest congestion,producitve cough dark yellow.  Denies fever or sweat.She apparently didn't actually get a script for Daliresp last visit. We discussed side effects again. She is trying to exercise- water aerobics and walking.  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  Acute visit. Cough worse in past week. Was put on prednisone 2 weeks ago for hip- didn't help chest-  pred ended yesterday. Occasional hot flash but no sustained fever. Continues Daliresp.  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  She notices a little routine cough but it has been more productive with some darkening sputum and thicker mucus in the last week. No fever, sore throat or sweats. Sinuses feel normal. We had previously tried [employer]- she remembers it kept her awake at night. We talked about trying it again if necessary to suppress her repeated bronchitis.  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  She comes for an acute visit. She has been around a lot of family recently. 2 days ago she thinks she went outdoors too quickly after her bath and may have gotten chills. Next on waking, she noted sore throat no fever no stomach upset. No  sinus congestion but sniffing a lot. Mainly complains of chest congestion and rattle cough productive of thick yellow sputum which has been sometimes brown. She started herself on doxycycline. We discussed her earlier experience with Daliresp. She never took it, although she bought it, because she disliked the printed side effect list. I went over those and my own experience with this drug again today. Has had flu shot this year.  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  Has had flu vaccine. After last visit she tried Daliresp but found it too expensive. She keeps her persistent deep chest rattle and coughs scant gray sputum. Does not really feel badly and is able to exercise. Does sometimes cough until she retches. Does not recognize reflux or sinus pain/drainage. She is using her Flutter device twice daily for pulmonary toilet.  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  CT 08/25/10-  1. Bilateral cylindrical type atelectasis within the lower lobes.  2. Bilateral lower lobe airspace densities, likely the sequela of  inflammation or infection. Suggest short-term follow-up  examination to ensure resolution.  3. Scattered small nodules within the right upper lobe are also  likely related to inflammation or infection. A short-term follow-  up examination in 3 months is recommended to ensure resolution.  Original Report Authenticated By: Rosealee AlbeeAYLOR H. STROUD, M.D.  Had flu vax. Mucomyst is not available - prescribed last visit.  Felt well after last here until took chill first of this week. Sore throat, increased productive cough and congestion. Head stuffy, no HA. Sputum cx  02/26/11- grew E. Coli. Septra was called in and helped, after refill she has now been off x 1 week.  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  At last office visit she stopped Daliresp as ineffective. We had given Ceftin for Escherichia coli on sputum culture. She admits cough has been better but remains productive of yellow sputum off and on. Denies chest pain, fever, chills, enlarged nodes.  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 Recently had flare up last week due to pollen but has gotten better now. She noted mostly nasal stuffiness. Not much cough or wheeze and no recent need for antibiotics.  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  Patient states that cough has been "off and on", depends on weather. pt c/o head congestion today Her chronic cough is very sensitive to weather changes. She asked me to evaluate a bulge on her back, demonstrating scoliosis with protrusion of right posterior rib cage. Relatively little productive cough recently and no fever or blood. CXR 02/04/12-reviewed IMPRESSION:  Stable basilar scarring. Stable cardiomegaly. No active lung  disease.  Original Report Authenticated By: Juline PatchPAUL D. BARRY, M.D.   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 FOLLOWS FOR:  States breathing "comes and goes."  Congestion this am with gray mucus and wheezing.denies having cold or acute infection. Low back pain, pending injection.  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 Follow up.  C/o back pain and has taken pain med for this.  Prod cough with dark mucus since yesterday.  No f/c/s, wheezing, chest tightness, or chest pain.   CXR 11/09/12 IMPRESSION:  Stable chronic basilar lung disease. No acute cardiopulmonary  process.  Original Report Authenticated By: Carey BullocksWilliam Veazey,  M.D.  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 Sister-in-law is here FOLLOWS FOR:  In hospital 8/26 - 8/31 with altered mental status attributed to pain meds.  Still complaining of swelling in both feet/ankles, SOB and cough w/grayish mucus Back pain remains a problem. Brain MRI positive for air-fluid levels in sinuses/ acute sinusitis. She denies facial pain or headache now after treatment with Cleocin and Biaxin. CXR 1 V  8/  /14 IMPRESSION:  1. Streaky bibasilar atelectasis and low lung volumes.  2. 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  Original Report Authenticated By: Britta MccreedySusan Turner, M.D.  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 Sister-in-law is here FOLLOWS FOR: continues to cough-productive at times-clear to brown in color. She reports fainting after getting out of a sauna yesterday. Sounds postural after heat. Ok now.  CXR 06/30/13 IMPRESSION:  No acute cardiopulmonary disease.  Electronically Signed  By: Amie Portlandavid Ormond M.D.  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 Most of her attention is really on weakness and chronic back pain. Using nebulizer 2 or 3 times a day. She had been  hospitalized earlier this year for GI bleed.  ROS- see HPI   Constitutional:   No-   weight loss, night sweats, fevers, chills, fatigue, lassitude. HEENT:   No-  headaches, difficulty swallowing, tooth/dental problems, sore throat,       No-  sneezing, itching, ear ache, +nasal congestion, post nasal drip,   CV:  No-   chest pain, orthopnea, PND, swelling in lower extremities, anasarca, dizziness, palpitations Resp: No- acute  shortness of breath with exertion or at rest.              +  productive cough,  + non-productive cough,  No-  coughing up of blood.              +change in color of mucus.  No- wheezing.   Skin: No-   rash or lesions. GI:  No-   heartburn, indigestion, abdominal pain, nausea, vomiting, GU: MS:  No-   joint pain or swelling.  +back pain Neuro- nothing unusual Psych:  No- change in mood or affect. No depression or anxiety.  No memory loss.  Objective:   Physical Exam   General- +drowsy from pain medication for back, Oriented, Affect-appropriate, Distress- none acute Skin- rash-none, lesions- none, excoriation- none Lymphadenopathy- none Head- atraumatic            Eyes- Gross vision intact, PERRLA, conjunctivae clear secretions            Ears- Hearing, canals-normal            Nose- Clear, no-Septal dev, mucus, polyps, erosion, perforation, .            Throat- Mallampati III , mucosa clear , drainage- none, tonsils- atrophic Neck- flexible , trachea midline, no stridor , thyroid nl, carotid no bruit Chest - symmetrical excursion , unlabored           Heart/CV- RRR , no murmur , no gallop  , no rub, nl s1 s2                           - JVD- none , edema+1-2, stasis changes- none, varices- none           Lung-  +loose cough,rhonchi+bilateral, no wheeze, dullness-none, rub- none, unlabored;                  room air saturation 95%           Chest wall- +right posterior lower thoracic ribs bulge reflecting scoliosis Abd-  Br/ Gen/ Rectal- Not done, not indicated Extrem- cyanosis- none, clubbing, none, atrophy- none, strength- nl Neuro- grossly intact to observation

## 2013-07-30 NOTE — Patient Instructions (Signed)
Your chronic bronchitis/ bronchiectasis is stable. There was no pneumonia on your last chest xray, March 7.  Please call us if you need refills for your albuterol rescue inhaler or the nebulizer solution for your machine

## 2013-08-03 ENCOUNTER — Ambulatory Visit: Payer: Medicare Other | Admitting: Physician Assistant

## 2013-08-03 ENCOUNTER — Ambulatory Visit (INDEPENDENT_AMBULATORY_CARE_PROVIDER_SITE_OTHER): Payer: Medicare Other | Admitting: Internal Medicine

## 2013-08-03 ENCOUNTER — Encounter: Payer: Self-pay | Admitting: Internal Medicine

## 2013-08-03 ENCOUNTER — Other Ambulatory Visit (INDEPENDENT_AMBULATORY_CARE_PROVIDER_SITE_OTHER): Payer: Medicare Other

## 2013-08-03 VITALS — BP 136/86 | HR 80 | Temp 98.7°F | Resp 16 | Ht 59.0 in | Wt 119.0 lb

## 2013-08-03 DIAGNOSIS — Z1231 Encounter for screening mammogram for malignant neoplasm of breast: Secondary | ICD-10-CM

## 2013-08-03 DIAGNOSIS — IMO0001 Reserved for inherently not codable concepts without codable children: Secondary | ICD-10-CM

## 2013-08-03 DIAGNOSIS — Z0000 Encounter for general adult medical examination without abnormal findings: Secondary | ICD-10-CM

## 2013-08-03 DIAGNOSIS — I10 Essential (primary) hypertension: Secondary | ICD-10-CM

## 2013-08-03 DIAGNOSIS — E876 Hypokalemia: Secondary | ICD-10-CM | POA: Insufficient documentation

## 2013-08-03 DIAGNOSIS — E1165 Type 2 diabetes mellitus with hyperglycemia: Principal | ICD-10-CM

## 2013-08-03 DIAGNOSIS — E785 Hyperlipidemia, unspecified: Secondary | ICD-10-CM

## 2013-08-03 DIAGNOSIS — D510 Vitamin B12 deficiency anemia due to intrinsic factor deficiency: Secondary | ICD-10-CM

## 2013-08-03 DIAGNOSIS — K269 Duodenal ulcer, unspecified as acute or chronic, without hemorrhage or perforation: Secondary | ICD-10-CM | POA: Insufficient documentation

## 2013-08-03 DIAGNOSIS — Z23 Encounter for immunization: Secondary | ICD-10-CM

## 2013-08-03 LAB — TSH: TSH: 1.01 u[IU]/mL (ref 0.35–5.50)

## 2013-08-03 LAB — BASIC METABOLIC PANEL
BUN: 13 mg/dL (ref 6–23)
CO2: 28 mEq/L (ref 19–32)
Calcium: 10 mg/dL (ref 8.4–10.5)
Chloride: 102 mEq/L (ref 96–112)
Creatinine, Ser: 1.1 mg/dL (ref 0.4–1.2)
GFR: 62.1 mL/min (ref >60.00)
Glucose, Bld: 62 mg/dL — ABNORMAL LOW (ref 70–99)
POTASSIUM: 3.1 meq/L — AB (ref 3.5–5.1)
Sodium: 142 mEq/L (ref 135–145)

## 2013-08-03 LAB — VITAMIN B12: Vitamin B-12: >1500 pg/mL — ABNORMAL HIGH (ref 211–911)

## 2013-08-03 LAB — CBC WITH DIFFERENTIAL/PLATELET
Basophils Absolute: 0 10*3/uL (ref 0.0–0.1)
Basophils Relative: 0.4 % (ref 0.0–3.0)
EOS PCT: 1.1 % (ref 0.0–5.0)
Eosinophils Absolute: 0.1 10*3/uL (ref 0.0–0.7)
HEMATOCRIT: 37.3 % (ref 36.0–46.0)
Hemoglobin: 12.2 g/dL (ref 12.0–15.0)
LYMPHS PCT: 22.6 % (ref 12.0–46.0)
Lymphs Abs: 2.2 10*3/uL (ref 0.7–4.0)
MCHC: 32.7 g/dL (ref 30.0–36.0)
MCV: 88.6 fl (ref 78.0–100.0)
Monocytes Absolute: 0.6 10*3/uL (ref 0.1–1.0)
Monocytes Relative: 6.1 % (ref 3.0–12.0)
Neutro Abs: 6.9 10*3/uL (ref 1.4–7.7)
Neutrophils Relative %: 69.8 % (ref 43.0–77.0)
Platelets: 325 10*3/uL (ref 150.0–400.0)
RBC: 4.21 Mil/uL (ref 3.87–5.11)
RDW: 16.3 % — ABNORMAL HIGH (ref 11.5–14.6)
WBC: 9.9 10*3/uL (ref 4.5–10.5)

## 2013-08-03 LAB — LIPID PANEL
Cholesterol: 150 mg/dL (ref 0–200)
HDL: 73.7 mg/dL (ref >39.00)
LDL Cholesterol: 65 mg/dL (ref 0–99)
Total CHOL/HDL Ratio: 2
Triglycerides: 59 mg/dL (ref 0.0–149.0)
VLDL: 11.8 mg/dL (ref 0.0–40.0)

## 2013-08-03 LAB — IBC PANEL
IRON: 47 ug/dL (ref 42–145)
Saturation Ratios: 12.6 % — ABNORMAL LOW (ref 20.0–50.0)
TRANSFERRIN: 267.4 mg/dL (ref 212.0–360.0)

## 2013-08-03 LAB — FERRITIN: Ferritin: 24.3 ng/mL (ref 10.0–291.0)

## 2013-08-03 LAB — FOLATE

## 2013-08-03 LAB — HM DIABETES FOOT EXAM

## 2013-08-03 MED ORDER — POTASSIUM CHLORIDE 20 MEQ PO PACK: 20.0000 meq | PACK | Freq: Two times a day (BID) | ORAL | Status: DC

## 2013-08-03 NOTE — Patient Instructions (Signed)
Iron Deficiency Anemia, Adult  Anemia is a condition in which there are less red blood cells or hemoglobin in the blood than normal. Hemoglobin is this part of red blood cells that carries oxygen. Iron deficiency anemia is anemia caused by too little iron. It is the most common type of anemia. It may leave you tired and short of breath.  CAUSES    Lack of iron in the diet.   Poor absorption of iron, as seen with intestinal disorders.   Intestinal bleeding.   Heavy periods.  SIGNS AND SYMPTOMS   Mild anemia may not be noticeable. Symptoms may include:   Fatigue.   Headache.   Pale skin.   Weakness.   Tiredness.   Shortness of breath.   Dizziness.   Cold hands and feet.   Fast or irregular heartbeat.  DIAGNOSIS   Diagnosis requires a thorough evaluation and physical exam by your health care provider. Blood tests are generally used to confirm iron deficiency anemia. Additional tests may be done to find the underlying cause of your anemia. These may include:   Testing for blood in the stool (fecal occult blood test).   A procedure to see inside the colon and rectum (colonoscopy).   A procedure to see inside the esophagus and stomach (endoscopy).  TREATMENT   Iron deficiency anemia is treated by correcting the cause of the deficiency. Treatment may involve:   Adding iron-rich foods to your diet.   Taking iron supplements. Pregnant or breastfeeding women need to take extra iron, because their normal diet usually does not provide the required amount.   Taking vitamins. Vitamin C improves the absorption of iron. Your health care provider may recommend taking your iron tablets with a glass of orange juice or vitamin C supplement.   Medicines to make heavy menstrual flow lighter.   Surgery.  HOME CARE INSTRUCTIONS    Take iron as directed by your health care provider.   If you cannot tolerate taking iron supplements by mouth, talk to your health care provider about taking them through a vein  (intravenously) or an injection into a muscle.   For the best iron absorption, iron supplements should be taken on an empty stomach. If you cannot tolerate them on an empty stomach, you may need to take them with food.   Do not drink milk or take antacids at the same time as your iron supplements. Milk and antacids may interfere with the absorption of iron.   Iron supplements can cause constipation. Make sure to include fiber in your diet to prevent constipation. A stool softener may also be recommended.   Take vitamins as directed by your health care provider.   Eat a diet rich in iron. Foods high in iron include liver, lean beef, whole-grain bread, eggs, dried fruit, and dark green, leafy vegetables.  SEEK IMMEDIATE MEDICAL CARE IF:    You faint. If this happens, do not drive. Call your local emergency services (911 in [place] if no other help is available.   You have chest pain.   You feel nauseous or vomit.   You have severe or increased shortness of breath with activity.   You feel weak.   You have a rapid heartbeat.   You have unexplained sweating.   You become lightheaded when getting up from a chair or bed.  MAKE SURE YOU:    Understand these instructions.   Will watch your condition.   Will get help right away if you are not   doing well or get worse.  Document Released: 03/30/2000 Document Revised: 01/21/2013 Document Reviewed: 12/08/2012  [employer] Patient Information 2014 [employer]

## 2013-08-03 NOTE — Progress Notes (Signed)
Subjective:    Patient ID: Sheila Cisneros, female    DOB: [birth year], 73 y.o.   MRN: [id number]  Anemia Presents for follow-up visit. Symptoms include anorexia, confusion, malaise/fatigue and pallor. There has been no abdominal pain, bruising/bleeding easily, fever, leg swelling, light-headedness, palpitations, paresthesias, pica or weight loss. Signs of blood loss that are not present include hematemesis, hematochezia, melena and vaginal bleeding. Past treatments include oral iron supplements. Past medical history includes dementia, malnutrition and recent illness.      Review of Systems  Constitutional: Positive for malaise/fatigue and fatigue. Negative for fever, chills, weight loss, diaphoresis and appetite change.  HENT: Negative.   Eyes: Negative.   Respiratory: Negative.  Negative for cough, choking, shortness of breath and stridor.   Cardiovascular: Negative.  Negative for chest pain, palpitations and leg swelling.  Gastrointestinal: Positive for anorexia. Negative for nausea, vomiting, abdominal pain, diarrhea, constipation, blood in stool, melena, hematochezia and hematemesis.  Endocrine: Negative.   Genitourinary: Negative.  Negative for vaginal bleeding.  Musculoskeletal: Positive for arthralgias and back pain. Negative for gait problem, joint swelling, myalgias, neck pain and neck stiffness.  Skin: Positive for pallor. Negative for color change, rash and wound.  Allergic/Immunologic: Negative.   Neurological: Negative.  Negative for dizziness, tremors, weakness, light-headedness, numbness, headaches and paresthesias.  Hematological: Negative.  Negative for adenopathy. Does not bruise/bleed easily.  Psychiatric/Behavioral: Positive for confusion.       Objective:   Physical Exam  Vitals reviewed. Constitutional: She is oriented to person, place, and time. She appears well-developed and well-nourished. No distress.  HENT:  Head: Normocephalic and atraumatic.    Mouth/Throat: Oropharynx is clear and moist. No oropharyngeal exudate.  Eyes: Conjunctivae are normal. Right eye exhibits no discharge. Left eye exhibits no discharge. No scleral icterus.  Neck: Normal range of motion. Neck supple. No JVD present. No tracheal deviation present. No thyromegaly present.  Cardiovascular: Normal rate, regular rhythm, normal heart sounds and intact distal pulses.  Exam reveals no gallop and no friction rub.   No murmur heard. Pulmonary/Chest: Effort normal and breath sounds normal. No stridor. No respiratory distress. She has no wheezes. She has no rales. She exhibits no tenderness.  Abdominal: Soft. Bowel sounds are normal. She exhibits no distension and no mass. There is no tenderness. There is no rebound and no guarding.  Musculoskeletal: Normal range of motion. She exhibits no edema and no tenderness.  Lymphadenopathy:    She has no cervical adenopathy.  Neurological: She is oriented to person, place, and time.  Skin: Skin is warm and dry. No rash noted. She is not diaphoretic. No erythema. No pallor.  Psychiatric: She has a normal mood and affect. Judgment and thought content normal. Her mood appears not anxious. Her affect is not angry, not blunt, not labile and not inappropriate. Her speech is not rapid and/or pressured, not delayed, not tangential and not slurred. She is slowed and withdrawn. She is not agitated, not aggressive, not hyperactive, not actively hallucinating and not combative. Thought content is not paranoid and not delusional. Cognition and memory are impaired. She does not exhibit a depressed mood. She expresses no homicidal and no suicidal ideation. She expresses no suicidal plans and no homicidal plans. She is communicative. She exhibits abnormal recent memory and abnormal remote memory. She is inattentive.     Lab Results  Component Value Date   WBC 9.3 06/22/2013   HGB 11.0* 06/22/2013   HCT 32.7* 06/22/2013   PLT 283 06/22/2013  GLUCOSE 135*  06/23/2013   ALT 14 06/20/2013   AST 21 06/20/2013   NA 139 06/23/2013   K 4.3 06/23/2013   CL 104 06/23/2013   CREATININE 0.71 06/23/2013   BUN 9 06/23/2013   CO2 20 06/23/2013   TSH 2.063 06/21/2013   INR 1.17 06/04/2013   HGBA1C 6.0* 06/22/2013       Assessment & Plan:

## 2013-08-04 ENCOUNTER — Telehealth: Payer: Self-pay | Admitting: Internal Medicine

## 2013-08-04 DIAGNOSIS — Z0000 Encounter for general adult medical examination without abnormal findings: Secondary | ICD-10-CM | POA: Insufficient documentation

## 2013-08-04 DIAGNOSIS — Z1231 Encounter for screening mammogram for malignant neoplasm of breast: Secondary | ICD-10-CM | POA: Insufficient documentation

## 2013-08-04 NOTE — Assessment & Plan Note (Signed)
Her blood sugars are well controlled 

## 2013-08-04 NOTE — Assessment & Plan Note (Signed)
Improvement noted Vitamin levels are normal as well

## 2013-08-04 NOTE — Assessment & Plan Note (Signed)
Her BP is well controlled 

## 2013-08-04 NOTE — Assessment & Plan Note (Signed)
I have asked her to start K+ replacement for this 

## 2013-08-04 NOTE — Telephone Encounter (Signed)
Relevant patient education assigned to patient using Emmi. ° °

## 2013-08-04 NOTE — Assessment & Plan Note (Signed)
I have asked her to follow up with GI about this I think she may need a colonoscopy as well

## 2013-08-04 NOTE — Assessment & Plan Note (Addendum)

## 2013-08-13 ENCOUNTER — Encounter (INDEPENDENT_AMBULATORY_CARE_PROVIDER_SITE_OTHER): Payer: Self-pay

## 2013-08-13 ENCOUNTER — Ambulatory Visit
Admission: RE | Admit: 2013-08-13 | Discharge: 2013-08-13 | Disposition: A | Discharge status: Home / Self Care | Payer: Medicare Other | Source: Ambulatory Visit | Attending: Internal Medicine | Admitting: Internal Medicine

## 2013-08-13 DIAGNOSIS — Z1231 Encounter for screening mammogram for malignant neoplasm of breast: Secondary | ICD-10-CM

## 2013-08-14 LAB — HM MAMMOGRAPHY: HM Mammogram: NORMAL

## 2013-08-19 ENCOUNTER — Ambulatory Visit: Payer: Medicare Other | Admitting: Internal Medicine

## 2013-08-30 NOTE — Assessment & Plan Note (Signed)
Chronic back pain 

## 2013-08-30 NOTE — Assessment & Plan Note (Addendum)
Chronic productive cough without change. We talked again about maintenance antibiotics as a strategy.

## 2013-10-05 ENCOUNTER — Encounter: Payer: Self-pay | Admitting: Internal Medicine

## 2013-10-05 ENCOUNTER — Ambulatory Visit (INDEPENDENT_AMBULATORY_CARE_PROVIDER_SITE_OTHER): Payer: Medicare Other | Admitting: Internal Medicine

## 2013-10-05 ENCOUNTER — Other Ambulatory Visit (INDEPENDENT_AMBULATORY_CARE_PROVIDER_SITE_OTHER): Payer: Medicare Other

## 2013-10-05 ENCOUNTER — Other Ambulatory Visit: Payer: Self-pay | Admitting: Internal Medicine

## 2013-10-05 VITALS — BP 110/78 | HR 108 | Temp 98.3°F | Resp 14 | Wt 110.0 lb

## 2013-10-05 DIAGNOSIS — E785 Hyperlipidemia, unspecified: Secondary | ICD-10-CM

## 2013-10-05 DIAGNOSIS — IMO0001 Reserved for inherently not codable concepts without codable children: Secondary | ICD-10-CM

## 2013-10-05 DIAGNOSIS — R748 Abnormal levels of other serum enzymes: Secondary | ICD-10-CM | POA: Insufficient documentation

## 2013-10-05 DIAGNOSIS — E876 Hypokalemia: Secondary | ICD-10-CM

## 2013-10-05 DIAGNOSIS — R5381 Other malaise: Secondary | ICD-10-CM

## 2013-10-05 DIAGNOSIS — R5383 Other fatigue: Principal | ICD-10-CM

## 2013-10-05 DIAGNOSIS — E1165 Type 2 diabetes mellitus with hyperglycemia: Secondary | ICD-10-CM

## 2013-10-05 DIAGNOSIS — N289 Disorder of kidney and ureter, unspecified: Secondary | ICD-10-CM

## 2013-10-05 LAB — BASIC METABOLIC PANEL
BUN: 44 mg/dL — ABNORMAL HIGH (ref 6–23)
CALCIUM: 10.2 mg/dL (ref 8.4–10.5)
CHLORIDE: 103 meq/L (ref 96–112)
CO2: 29 meq/L (ref 19–32)
Creatinine, Ser: 1.5 mg/dL — ABNORMAL HIGH (ref 0.4–1.2)
GFR: 42.86 mL/min — ABNORMAL LOW (ref >60.00)
Glucose, Bld: 106 mg/dL — ABNORMAL HIGH (ref 70–99)
Potassium: 2.9 mEq/L — ABNORMAL LOW (ref 3.5–5.1)
SODIUM: 142 meq/L (ref 135–145)

## 2013-10-05 LAB — CORTISOL: Cortisol, Plasma: 15.1 ug/dL

## 2013-10-05 LAB — HEPATIC FUNCTION PANEL
ALT: 28 U/L (ref 0–35)
AST: 52 U/L — AB (ref 0–37)
Albumin: 3.6 g/dL (ref 3.5–5.2)
Alkaline Phosphatase: 103 U/L (ref 39–117)
Bilirubin, Direct: 0 mg/dL (ref 0.0–0.3)
Total Bilirubin: 0.4 mg/dL (ref 0.2–1.2)
Total Protein: 7.7 g/dL (ref 6.0–8.3)

## 2013-10-05 LAB — CK: Total CK: 1016 U/L — ABNORMAL HIGH (ref 7–177)

## 2013-10-05 LAB — HEMOGLOBIN A1C: HEMOGLOBIN A1C: 6 % (ref 4.6–6.5)

## 2013-10-05 NOTE — Assessment & Plan Note (Signed)
CK LFTs
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
## 2013-10-05 NOTE — Patient Instructions (Addendum)
Your next office appointment will be determined based upon review of your pending labs . Those instructions will be transmitted to you by mail.  Your B12 level is extremely high; decrease the B12 supplement to every other day, Monday, Wednesday, Friday, and Sunday.  Please see your ophthalmologist to evaluate the pigment changes in the right eye.  Please do not use Q-tips in ear. Should wax build up occur, please put 2-3 drops of mineral oil in the affected  ear at night to soften the wax .Cover the canal with a  cotton ball to prevent the oil from staining bed linens. In the morning fill the ear canal with hydrogen peroxide & lie in the opposite lateral decubitus position(on the side opposite the affected ear)  for 10-15 minutes. After allowing this period of time for the peroxide to dissolve the wax ;shower and use the thinnest washrag available to wick out the wax. If both ears are involved ; alternate this treatment from ear to ear each night until no wax is found on the washrag.

## 2013-10-05 NOTE — Assessment & Plan Note (Signed)
A1c ? D/C Januvia

## 2013-10-05 NOTE — Assessment & Plan Note (Signed)
See order(s).

## 2013-10-05 NOTE — Progress Notes (Signed)
Pre visit review using our clinic review tool, if applicable. No additional management support is needed unless otherwise documented below in the visit note. 

## 2013-10-05 NOTE — Assessment & Plan Note (Signed)
BMET 

## 2013-10-12 ENCOUNTER — Other Ambulatory Visit (INDEPENDENT_AMBULATORY_CARE_PROVIDER_SITE_OTHER): Payer: Medicare Other

## 2013-10-12 DIAGNOSIS — R748 Abnormal levels of other serum enzymes: Secondary | ICD-10-CM

## 2013-10-12 DIAGNOSIS — N289 Disorder of kidney and ureter, unspecified: Secondary | ICD-10-CM

## 2013-10-12 DIAGNOSIS — E876 Hypokalemia: Secondary | ICD-10-CM

## 2013-10-12 LAB — BASIC METABOLIC PANEL
BUN: 31 mg/dL — ABNORMAL HIGH (ref 6–23)
CHLORIDE: 104 meq/L (ref 96–112)
CO2: 26 meq/L (ref 19–32)
CREATININE: 1.7 mg/dL — AB (ref 0.4–1.2)
Calcium: 10.1 mg/dL (ref 8.4–10.5)
GFR: 39.01 mL/min — ABNORMAL LOW (ref >60.00)
Glucose, Bld: 112 mg/dL — ABNORMAL HIGH (ref 70–99)
Potassium: 3.8 mEq/L (ref 3.5–5.1)
Sodium: 141 mEq/L (ref 135–145)

## 2013-10-12 LAB — CK: Total CK: 57 U/L (ref 7–177)

## 2013-11-20 ENCOUNTER — Ambulatory Visit (INDEPENDENT_AMBULATORY_CARE_PROVIDER_SITE_OTHER): Payer: Medicare Other | Admitting: Internal Medicine

## 2013-11-20 ENCOUNTER — Encounter: Payer: Self-pay | Admitting: Internal Medicine

## 2013-11-20 VITALS — BP 140/85 | HR 93 | Temp 98.5°F | Wt 111.0 lb

## 2013-11-20 DIAGNOSIS — J209 Acute bronchitis, unspecified: Secondary | ICD-10-CM

## 2013-11-20 DIAGNOSIS — J069 Acute upper respiratory infection, unspecified: Secondary | ICD-10-CM

## 2013-11-20 DIAGNOSIS — J440 Chronic obstructive pulmonary disease with acute lower respiratory infection: Secondary | ICD-10-CM

## 2013-11-20 MED ORDER — SULFAMETHOXAZOLE-TMP DS 800-160 MG PO TABS: 1.0000 | ORAL_TABLET | Freq: Two times a day (BID) | ORAL | Status: DC

## 2013-11-20 NOTE — Progress Notes (Signed)
Pre visit review using our clinic review tool, if applicable. No additional management support is needed unless otherwise documented below in the visit note. 

## 2013-11-20 NOTE — Patient Instructions (Addendum)
Carry room temperature water and sip liberally after coughing. Plain Mucinex (NOT D) for thick secretions ;force NON dairy fluids .   Plain Allegra (NOT D )  160 daily , Loratidine 10 mg , OR Zyrtec 10 mg @ bedtime  as needed for itchy eyes & sneezing.     [facility] Yetta BarreJones has evaluated the passing out spell.

## 2013-11-20 NOTE — Progress Notes (Signed)
   Subjective:    Patient ID: Sheila Cisneros, female    DOB: 09/08/1940, 73 y.o.   MRN: [id number]  HPI  Since late July she's had residual respiratory tract symptoms; her initial symptoms were sore throat and rhinitis. This was associated with head congestion as well as  chest congestion. A cough has been productive of yellow sputum ; there has been no nasal discharge  She's used Advil, Tylenol, Mucinex D, and Allegra with some benefit  She also had an apparent cerumen impaction which the otolaryngologist removed  She has some wheezing and shortness of breath with exertion. She will use albuterol in her nebulizer prescribed by Dr. Maple HudsonYoung up to 4 times a day    Review of Systems  She denies fever, chills, sweats  She has no significant extrinsic symptoms of itchy, watery eyes, sneezing  She has no frontal headache or facial pain at this time.  She was requesting [facility] [employer]. When I asked why such as needed; she states that she had syncope four-5 months ago. She states that she was evaluated in the emergency room. In reviewing the electronic medical records; I can find no such evaluation.  She apparently refused to go to the ER when EMS assessed her.     Objective:   Physical Exam  Positive or pertinent physical findings include: Sclerae slightly muddy. There is no conjunctivitis. She is thin but appears adequately nourished Chest reveals mild rales diffusely. She has a loose rattly cough.  General appearance: no acute distress or increased work of breathing is present.  No  lymphadenopathy about the head, neck, or axilla noted.   Eyes: No icterus. Ears:  External ear exam shows no significant lesions or deformities.  Otoscopic examination reveals clear canals, tympanic membranes are dull but intact bilaterally without bulging, retraction, inflammation or discharge.  Nose:  External nasal examination shows no deformity or inflammation. Nasal mucosa are pink  and moist without lesions or exudates. No septal dislocation or deviation.No obstruction to airflow.   Oral exam:  lips and gums are healthy appearing.There is no oropharyngeal erythema or exudate noted.   Neck:  No deformities, thyromegaly, masses, or tenderness noted.     Heart:  Normal rate and regular rhythm. S1 and S2 normal without gallop, murmur, click, rub or other extra sounds.   Lungs:No increased work of breathing.    Extremities:  No cyanosis, edema, or clubbing  noted    Skin: Warm & dry w/o jaundice or tenting.         Assessment & Plan:  #1 acute bronchitis w/o bronchospasm #2 URI, acute w/ocriteria for rhinosinusitis #3 syncope , PMH of, w/o evaluation to date Plan: See orders and recommendations

## 2013-12-30 ENCOUNTER — Ambulatory Visit: Payer: Medicare Other

## 2013-12-30 VITALS — BP 130/70

## 2013-12-30 DIAGNOSIS — Z0130 Encounter for examination of blood pressure without abnormal findings: Secondary | ICD-10-CM

## 2014-01-05 ENCOUNTER — Other Ambulatory Visit: Payer: Self-pay | Admitting: Internal Medicine

## 2014-01-06 ENCOUNTER — Telehealth: Payer: Self-pay

## 2014-01-06 NOTE — Telephone Encounter (Signed)
error 

## 2014-01-07 ENCOUNTER — Telehealth: Payer: Self-pay | Admitting: Internal Medicine

## 2014-01-07 MED ORDER — ALBUTEROL SULFATE HFA 108 (90 BASE) MCG/ACT IN AERS: 2.0000 | INHALATION_SPRAY | Freq: Four times a day (QID) | RESPIRATORY_TRACT | Status: DC | PRN

## 2014-01-07 NOTE — Telephone Encounter (Signed)
Pt aware RX has been sent. Nothing further needed 

## 2014-01-12 ENCOUNTER — Telehealth: Payer: Self-pay | Admitting: Emergency Medicine

## 2014-01-12 MED ORDER — ALBUTEROL SULFATE (5 MG/ML) 0.5% IN NEBU: 2.5000 mg | INHALATION_SOLUTION | RESPIRATORY_TRACT | Status: DC | PRN

## 2014-01-12 NOTE — Telephone Encounter (Signed)
Called and spoke with pt and she needed refills of the albuterol for her nebulizer.  This has been sent to her pharmacy and pt is aware of her appt with [facility] in October.

## 2014-02-01 ENCOUNTER — Encounter: Payer: Self-pay | Admitting: Internal Medicine

## 2014-02-01 ENCOUNTER — Ambulatory Visit (INDEPENDENT_AMBULATORY_CARE_PROVIDER_SITE_OTHER): Payer: Medicare Other | Admitting: Internal Medicine

## 2014-02-01 VITALS — BP 138/88 | HR 91 | Ht 62.0 in | Wt 101.4 lb

## 2014-02-01 DIAGNOSIS — J440 Chronic obstructive pulmonary disease with acute lower respiratory infection: Secondary | ICD-10-CM

## 2014-02-01 DIAGNOSIS — E46 Unspecified protein-calorie malnutrition: Secondary | ICD-10-CM

## 2014-02-01 DIAGNOSIS — M4120 Other idiopathic scoliosis, site unspecified: Secondary | ICD-10-CM

## 2014-02-01 MED ORDER — ALBUTEROL SULFATE HFA 108 (90 BASE) MCG/ACT IN AERS: 2.0000 | INHALATION_SPRAY | Freq: Four times a day (QID) | RESPIRATORY_TRACT | Status: DC | PRN

## 2014-02-01 NOTE — Patient Instructions (Addendum)
Refill sent for refill albuterol rescue inhaler   Please call as needed

## 2014-02-01 NOTE — Progress Notes (Signed)
Patient ID: Sheila Cisneros, female    DOB: [birth year], 73 y.o.   MRN: [id number] HPI  07/31/10- 70 yoF followed for chronic bronchitis/ bronchiectasis and hx rhinosinusitis. Never smoker. Last here- January 30, 2010. She remained congested throough the winter, but she put up with it. No events needing medical visits. Pollen now makes cough somewhat more constant. Rarely productive. Denies fever or night sweats. Nebulizer still helps, used about twice daily. Denies  Chest pain or headache.   09/05/10- Chronic bronchitis, bronchiectasis, hx rhinosinusitis Reports increased chest congestion,producitve cough dark yellow.  Denies fever or sweat.She apparently didn't actually get a script for Daliresp last visit. We discussed side effects again. She is trying to exercise- water aerobics and walking.  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  Acute visit. Cough worse in past week. Was put on prednisone 2 weeks ago for hip- didn't help chest-  pred ended yesterday. Occasional hot flash but no sustained fever. Continues Daliresp.  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  She notices a little routine cough but it has been more productive with some darkening sputum and thicker mucus in the last week. No fever, sore throat or sweats. Sinuses feel normal. We had previously tried [employer]- she remembers it kept her awake at night. We talked about trying it again if necessary to suppress her repeated bronchitis.  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  She comes for an acute visit. She has been around a lot of family recently. 2 days ago she thinks she went outdoors too quickly after her bath and may have gotten chills. Next on waking, she noted sore throat no fever no stomach upset. No  sinus congestion but sniffing a lot. Mainly complains of chest congestion and rattle cough productive of thick yellow sputum which has been sometimes brown. She started herself on doxycycline. We discussed her earlier experience with Daliresp. She never took it, although she bought it, because she disliked the printed side effect list. I went over those and my own experience with this drug again today. Has had flu shot this year.  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  Has had flu vaccine. After last visit she tried Daliresp but found it too expensive. She keeps her persistent deep chest rattle and coughs scant gray sputum. Does not really feel badly and is able to exercise. Does sometimes cough until she retches. Does not recognize reflux or sinus pain/drainage. She is using her Flutter device twice daily for pulmonary toilet.  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  CT 08/25/10-  1. Bilateral cylindrical type atelectasis within the lower lobes.  2. Bilateral lower lobe airspace densities, likely the sequela of  inflammation or infection. Suggest short-term follow-up  examination to ensure resolution.  3. Scattered small nodules within the right upper lobe are also  likely related to inflammation or infection. A short-term follow-  up examination in 3 months is recommended to ensure resolution.  Original Report Authenticated By: Rosealee AlbeeAYLOR H. STROUD, M.D.  Had flu vax. Mucomyst is not available - prescribed last visit.  Felt well after last here until took chill first of this week. Sore throat, increased productive cough and congestion. Head stuffy, no HA. Sputum cx  02/26/11- grew E. Coli. Septra was called in and helped, after refill she has now been off x 1 week.  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  At last office visit she stopped Daliresp as ineffective. We had given Ceftin for Escherichia coli on sputum culture. She admits cough has been better but remains productive of yellow sputum off and on. Denies chest pain, fever, chills, enlarged nodes.  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 Recently had flare up last week due to pollen but has gotten better now. She noted mostly nasal stuffiness. Not much cough or wheeze and no recent need for antibiotics.  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  Patient states that cough has been "off and on", depends on weather. pt c/o head congestion today Her chronic cough is very sensitive to weather changes. She asked me to evaluate a bulge on her back, demonstrating scoliosis with protrusion of right posterior rib cage. Relatively little productive cough recently and no fever or blood. CXR 02/04/12-reviewed IMPRESSION:  Stable basilar scarring. Stable cardiomegaly. No active lung  disease.  Original Report Authenticated By: Juline PatchPAUL D. BARRY, M.D.   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 FOLLOWS FOR:  States breathing "comes and goes."  Congestion this am with gray mucus and wheezing.denies having cold or acute infection. Low back pain, pending injection.  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 Follow up.  C/o back pain and has taken pain med for this.  Prod cough with dark mucus since yesterday.  No f/c/s, wheezing, chest tightness, or chest pain.   CXR 11/09/12 IMPRESSION:  Stable chronic basilar lung disease. No acute cardiopulmonary  process.  Original Report Authenticated By: Carey BullocksWilliam Veazey,  M.D.  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 Sister-in-law is here FOLLOWS FOR:  In hospital 8/26 - 8/31 with altered mental status attributed to pain meds.  Still complaining of swelling in both feet/ankles, SOB and cough w/grayish mucus Back pain remains a problem. Brain MRI positive for air-fluid levels in sinuses/ acute sinusitis. She denies facial pain or headache now after treatment with Cleocin and Biaxin. CXR 1 V  8/  /14 IMPRESSION:  1. Streaky bibasilar atelectasis and low lung volumes.  2. 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  Original Report Authenticated By: Britta MccreedySusan Turner, M.D.  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 Sister-in-law is here FOLLOWS FOR: continues to cough-productive at times-clear to brown in color. She reports fainting after getting out of a sauna yesterday. Sounds postural after heat. Ok now.  CXR 06/30/13 IMPRESSION:  No acute cardiopulmonary disease.  Electronically Signed  By: Amie Portlandavid Ormond M.D.  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 Most of her attention is really on weakness and chronic back pain. Using nebulizer 2 or 3 times a day. She had been  hospitalized earlier this year for GI bleed.  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 Has Flutter but not using. Had flu vax. Occasional nasal stuffy. Limited by chronic back pain/ scoliosis.   ROS- see HPI   Constitutional:   No-   weight loss, night sweats, fevers, chills, fatigue, lassitude. HEENT:   No-  headaches, difficulty swallowing, tooth/dental problems, sore throat,       No-  sneezing, itching, ear ache, +nasal congestion, post nasal drip,  CV:  No-   chest pain, orthopnea, PND, swelling in lower extremities, anasarca, dizziness, palpitations Resp: No- acute  shortness of breath with exertion or at rest.              +  productive cough,  + non-productive cough,  No-  coughing up of blood.              +change in color of mucus.  No- wheezing.   Skin: No-   rash or lesions. GI:  No-   heartburn, indigestion, abdominal pain, nausea, vomiting, GU: MS:  No-   joint pain or swelling.  +back pain Neuro- nothing unusual Psych:  No- change in mood or affect. No depression or anxiety.  No memory loss.  Objective:   Physical Exam   General- +drowsy from pain medication for back, Oriented, Affect-appropriate, Distress- none                         acute   Thin                   Skin- rash-none, lesions- none, excoriation- none Lymphadenopathy- none Head- atraumatic            Eyes- Gross vision intact, PERRLA, conjunctivae clear secretions            Ears- Hearing, canals-normal            Nose- Clear, no-Septal dev, mucus, polyps, erosion, perforation, .            Throat- Mallampati III , mucosa clear , drainage- none, tonsils- atrophic Neck- flexible , trachea midline, no stridor , thyroid nl, carotid no bruit Chest - symmetrical excursion , unlabored           Heart/CV- RRR , no murmur , no gallop  , no rub, nl s1 s2                           - JVD- none , edema+1, stasis changes- none, varices- none           Lung-  +loose cough,rhonchi+few, no wheeze, dullness-none, rub-  none, unlabored;                                       room air saturation 91%           Chest wall- +right posterior lower thoracic ribs bulge reflecting scoliosis Abd-  Br/ Gen/ Rectal- Not done, not indicated Extrem- cyanosis- none, clubbing, none, atrophy- none, strength- nl Neuro- grossly intact to observation

## 2014-02-01 NOTE — Assessment & Plan Note (Signed)
Encouraged Ensure at end of each meal, not instead of meals.

## 2014-02-01 NOTE — Assessment & Plan Note (Addendum)
Little change over several years. Always sounds rough, but hasn't responded to interventions and seems to be more concerned about her back. We have assumed a chronic low grade aspiration mechanism, noting E. Coli cultured. Reflux precautions didn't seem to help. Compliance couldn't be assured. Plan- emphasize use of Flutter device for pulmonary toilet.

## 2014-02-01 NOTE — Assessment & Plan Note (Signed)
Chronic back pain, limits and is aggravated by her cough

## 2014-03-13 ENCOUNTER — Other Ambulatory Visit: Payer: Self-pay | Admitting: Internal Medicine

## 2014-03-15 ENCOUNTER — Ambulatory Visit (INDEPENDENT_AMBULATORY_CARE_PROVIDER_SITE_OTHER): Payer: Medicare Other | Admitting: Internal Medicine

## 2014-03-15 ENCOUNTER — Other Ambulatory Visit (INDEPENDENT_AMBULATORY_CARE_PROVIDER_SITE_OTHER): Payer: Medicare Other

## 2014-03-15 ENCOUNTER — Encounter: Payer: Self-pay | Admitting: Internal Medicine

## 2014-03-15 VITALS — BP 124/84 | HR 80 | Temp 97.9°F | Resp 16 | Ht 59.0 in | Wt 105.0 lb

## 2014-03-15 DIAGNOSIS — I82403 Acute embolism and thrombosis of unspecified deep veins of lower extremity, bilateral: Secondary | ICD-10-CM

## 2014-03-15 DIAGNOSIS — E785 Hyperlipidemia, unspecified: Secondary | ICD-10-CM

## 2014-03-15 DIAGNOSIS — F0390 Unspecified dementia without behavioral disturbance: Secondary | ICD-10-CM

## 2014-03-15 DIAGNOSIS — E118 Type 2 diabetes mellitus with unspecified complications: Secondary | ICD-10-CM | POA: Diagnosis not present

## 2014-03-15 DIAGNOSIS — I10 Essential (primary) hypertension: Secondary | ICD-10-CM

## 2014-03-15 DIAGNOSIS — Z0000 Encounter for general adult medical examination without abnormal findings: Secondary | ICD-10-CM | POA: Diagnosis not present

## 2014-03-15 LAB — URINALYSIS, ROUTINE W REFLEX MICROSCOPIC
Bilirubin Urine: NEGATIVE
Hgb urine dipstick: NEGATIVE
KETONES UR: NEGATIVE
LEUKOCYTES UA: NEGATIVE
Nitrite: NEGATIVE
RBC / HPF: NONE SEEN (ref 0–?)
SPECIFIC GRAVITY, URINE: 1.02 (ref 1.000–1.030)
TOTAL PROTEIN, URINE-UPE24: NEGATIVE
Urine Glucose: NEGATIVE
Urobilinogen, UA: 0.2 (ref 0.0–1.0)
WBC, UA: NONE SEEN (ref 0–?)
pH: 7 (ref 5.0–8.0)

## 2014-03-15 LAB — COMPREHENSIVE METABOLIC PANEL
ALBUMIN: 3.7 g/dL (ref 3.5–5.2)
ALT: 12 U/L (ref 0–35)
AST: 18 U/L (ref 0–37)
Alkaline Phosphatase: 202 U/L — ABNORMAL HIGH (ref 39–117)
BUN: 14 mg/dL (ref 6–23)
CALCIUM: 9.7 mg/dL (ref 8.4–10.5)
CHLORIDE: 99 meq/L (ref 96–112)
CO2: 26 mEq/L (ref 19–32)
Creatinine, Ser: 1.1 mg/dL (ref 0.4–1.2)
GFR: 63.98 mL/min (ref >60.00)
Glucose, Bld: 81 mg/dL (ref 70–99)
POTASSIUM: 3.6 meq/L (ref 3.5–5.1)
SODIUM: 135 meq/L (ref 135–145)
Total Bilirubin: 0.3 mg/dL (ref 0.2–1.2)
Total Protein: 7.5 g/dL (ref 6.0–8.3)

## 2014-03-15 LAB — LIPID PANEL
CHOL/HDL RATIO: 2
Cholesterol: 132 mg/dL (ref 0–200)
HDL: 58.8 mg/dL (ref >39.00)
LDL Cholesterol: 60 mg/dL (ref 0–99)
NonHDL: 73.2
Triglycerides: 65 mg/dL (ref 0.0–149.0)
VLDL: 13 mg/dL (ref 0.0–40.0)

## 2014-03-15 LAB — CBC WITH DIFFERENTIAL/PLATELET
BASOS ABS: 0.1 10*3/uL (ref 0.0–0.1)
BASOS PCT: 0.5 % (ref 0.0–3.0)
EOS ABS: 0.1 10*3/uL (ref 0.0–0.7)
Eosinophils Relative: 0.5 % (ref 0.0–5.0)
HCT: 34.5 % — ABNORMAL LOW (ref 36.0–46.0)
Hemoglobin: 11.3 g/dL — ABNORMAL LOW (ref 12.0–15.0)
LYMPHS PCT: 20.5 % (ref 12.0–46.0)
Lymphs Abs: 2.5 10*3/uL (ref 0.7–4.0)
MCHC: 32.7 g/dL (ref 30.0–36.0)
MCV: 93.4 fl (ref 78.0–100.0)
Monocytes Absolute: 0.6 10*3/uL (ref 0.1–1.0)
Monocytes Relative: 4.8 % (ref 3.0–12.0)
NEUTROS PCT: 73.7 % (ref 43.0–77.0)
Neutro Abs: 8.9 10*3/uL — ABNORMAL HIGH (ref 1.4–7.7)
PLATELETS: 328 10*3/uL (ref 150.0–400.0)
RBC: 3.69 Mil/uL — ABNORMAL LOW (ref 3.87–5.11)
RDW: 13.5 % (ref 11.5–15.5)
WBC: 12.1 10*3/uL — ABNORMAL HIGH (ref 4.0–10.5)

## 2014-03-15 LAB — HEMOGLOBIN A1C: HEMOGLOBIN A1C: 6.1 % (ref 4.6–6.5)

## 2014-03-15 LAB — CK: Total CK: 61 U/L (ref 7–177)

## 2014-03-15 LAB — MICROALBUMIN / CREATININE URINE RATIO
Creatinine,U: 147.5 mg/dL
MICROALB/CREAT RATIO: 1 mg/g (ref 0.0–30.0)
Microalb, Ur: 1.5 mg/dL (ref 0.0–1.9)

## 2014-03-15 LAB — TSH: TSH: 1.14 u[IU]/mL (ref 0.35–4.50)

## 2014-03-15 MED ORDER — HYDROCHLOROTHIAZIDE 12.5 MG PO CAPS: 12.5000 mg | ORAL_CAPSULE | Freq: Every day | ORAL | Status: DC

## 2014-03-15 MED ORDER — LORATADINE 10 MG PO TABS: 10.0000 mg | ORAL_TABLET | Freq: Every day | ORAL | Status: DC

## 2014-03-15 MED ORDER — DONEPEZIL HCL 5 MG PO TABS: 5.0000 mg | ORAL_TABLET | Freq: Every day | ORAL | Status: DC

## 2014-03-15 NOTE — Patient Instructions (Signed)

## 2014-03-15 NOTE — Progress Notes (Signed)
Pre visit review using our clinic review tool, if applicable. No additional management support is needed unless otherwise documented below in the visit note. 

## 2014-03-15 NOTE — Progress Notes (Signed)
Subjective:    Patient ID: Sheila Cisneros, female    DOB: [birth year], 73 y.o.   MRN: [id number]  Diabetes She presents for her follow-up diabetic visit. She has type 2 diabetes mellitus. Her disease course has been improving. Hypoglycemia symptoms include confusion. Pertinent negatives for hypoglycemia include no dizziness, headaches, nervousness/anxiousness, seizures or speech difficulty. Pertinent negatives for diabetes include no fatigue and no weakness. Symptoms are stable. Diabetic complications include nephropathy. Current diabetic treatment includes oral agent (monotherapy). She is compliant with treatment some of the time. Her weight is stable. She is following a generally healthy diet. Meal planning includes avoidance of concentrated sweets. She has not had a previous visit with a dietitian. She never participates in exercise. There is no change in her home blood glucose trend. An ACE inhibitor/angiotensin II receptor blocker is not being taken. She does not see a podiatrist.Eye exam is not current.      Review of Systems  Constitutional: Negative.  Negative for fever, chills, diaphoresis, appetite change and fatigue.  HENT: Negative.   Eyes: Negative.   Respiratory: Negative.  Negative for cough, choking, chest tightness, shortness of breath and stridor.   Cardiovascular: Negative.  Negative for palpitations and leg swelling.  Gastrointestinal: Negative.  Negative for nausea, vomiting, abdominal pain, diarrhea, constipation and blood in stool.  Endocrine: Negative.   Genitourinary: Negative.   Musculoskeletal: Positive for back pain and arthralgias. Negative for myalgias, joint swelling, gait problem and neck pain.  Skin: Negative.  Negative for rash.  Allergic/Immunologic: Negative.   Neurological: Negative.  Negative for dizziness, seizures, speech difficulty, weakness, light-headedness and headaches.  Hematological: Negative.  Negative for adenopathy. Does not bruise/bleed  easily.  Psychiatric/Behavioral: Positive for confusion and decreased concentration. Negative for suicidal ideas, hallucinations, behavioral problems, sleep disturbance, self-injury, dysphoric mood and agitation. The patient is not nervous/anxious and is not hyperactive.        Objective:   Physical Exam  Constitutional: She is oriented to person, place, and time. She appears well-developed and well-nourished. No distress.  HENT:  Head: Normocephalic and atraumatic.  Mouth/Throat: Oropharynx is clear and moist. No oropharyngeal exudate.  Eyes: Conjunctivae are normal. Right eye exhibits no discharge. Left eye exhibits no discharge. No scleral icterus.  Neck: Normal range of motion. Neck supple. No JVD present. No tracheal deviation present. No thyromegaly present.  Cardiovascular: Normal rate, regular rhythm, normal heart sounds and intact distal pulses.  Exam reveals no gallop and no friction rub.   No murmur heard. Pulmonary/Chest: Effort normal and breath sounds normal. No stridor. No respiratory distress. She has no wheezes. She has no rales. She exhibits no tenderness.  Abdominal: Soft. Bowel sounds are normal. She exhibits no distension and no mass. There is no tenderness. There is no rebound and no guarding. Hernia confirmed negative in the right inguinal area and confirmed negative in the left inguinal area.  Genitourinary: Rectum normal and vagina normal. Rectal exam shows no external hemorrhoid, no internal hemorrhoid, no fissure, no mass, no tenderness and anal tone normal. Guaiac negative stool. No breast swelling, tenderness, discharge or bleeding. No labial fusion. There is no rash, tenderness, lesion or injury on the right labia. There is no rash, tenderness, lesion or injury on the left labia. Cervix exhibits no discharge. Right adnexum displays no mass, no tenderness and no fullness. Left adnexum displays no mass, no tenderness and no fullness. No erythema, tenderness or bleeding in  the vagina. No foreign body around the vagina. No signs of  injury around the vagina. No vaginal discharge found.  Musculoskeletal: Normal range of motion. She exhibits no edema or tenderness.  Lymphadenopathy:    She has no cervical adenopathy.       Right: No inguinal adenopathy present.       Left: No inguinal adenopathy present.  Neurological: She is oriented to person, place, and time.  Skin: Skin is warm and dry. No rash noted. She is not diaphoretic. No erythema. No pallor.  Psychiatric: She has a normal mood and affect. Her speech is normal. Judgment and thought content normal. Her mood appears not anxious. She is slowed, withdrawn and actively hallucinating. Cognition and memory are normal. She is inattentive.  Vitals reviewed.    Lab Results  Component Value Date   WBC 9.9 08/03/2013   HGB 12.2 08/03/2013   HCT 37.3 08/03/2013   PLT 325.0 08/03/2013   GLUCOSE 112* 10/12/2013   CHOL 150 08/03/2013   TRIG 59.0 08/03/2013   HDL 73.70 08/03/2013   LDLCALC 65 08/03/2013   ALT 28 10/05/2013   AST 52* 10/05/2013   NA 141 10/12/2013   K 3.8 10/12/2013   CL 104 10/12/2013   CREATININE 1.7* 10/12/2013   BUN 31* 10/12/2013   CO2 26 10/12/2013   TSH 1.01 08/03/2013   INR 1.17 06/04/2013   HGBA1C 6.0 10/05/2013       Assessment & Plan:

## 2014-03-16 ENCOUNTER — Encounter: Payer: Self-pay | Admitting: Internal Medicine

## 2014-03-16 LAB — D-DIMER, QUANTITATIVE (NOT AT ARMC): D DIMER QUANT: 1.2 ug{FEU}/mL — AB (ref 0.00–0.48)

## 2014-03-16 NOTE — Assessment & Plan Note (Signed)
Her blood sugars are well controlled 

## 2014-03-16 NOTE — Assessment & Plan Note (Signed)
I have asked her to restart aricept

## 2014-03-16 NOTE — Assessment & Plan Note (Signed)
Her BP is well controlled Lytes and renal function are stable 

## 2014-03-16 NOTE — Assessment & Plan Note (Signed)
D-dimer is not elevated enough to be of concern for recurrence

## 2014-03-16 NOTE — Assessment & Plan Note (Signed)
She has achieved her LDL goal 

## 2014-03-16 NOTE — Assessment & Plan Note (Signed)

## 2014-03-23 ENCOUNTER — Other Ambulatory Visit: Payer: Self-pay | Admitting: Internal Medicine

## 2014-07-14 ENCOUNTER — Ambulatory Visit (INDEPENDENT_AMBULATORY_CARE_PROVIDER_SITE_OTHER)
Admission: RE | Admit: 2014-07-14 | Discharge: 2014-07-14 | Disposition: A | Discharge status: Home / Self Care | Payer: Medicare Other | Source: Ambulatory Visit | Attending: Internal Medicine | Admitting: Internal Medicine

## 2014-07-14 ENCOUNTER — Other Ambulatory Visit (INDEPENDENT_AMBULATORY_CARE_PROVIDER_SITE_OTHER): Payer: Medicare Other

## 2014-07-14 ENCOUNTER — Encounter: Payer: Self-pay | Admitting: Internal Medicine

## 2014-07-14 ENCOUNTER — Ambulatory Visit (INDEPENDENT_AMBULATORY_CARE_PROVIDER_SITE_OTHER): Payer: Medicare Other | Admitting: Internal Medicine

## 2014-07-14 VITALS — BP 144/82 | HR 78 | Temp 98.2°F | Resp 16 | Ht 59.0 in | Wt 102.0 lb

## 2014-07-14 DIAGNOSIS — F0390 Unspecified dementia without behavioral disturbance: Secondary | ICD-10-CM

## 2014-07-14 DIAGNOSIS — R05 Cough: Secondary | ICD-10-CM | POA: Diagnosis not present

## 2014-07-14 DIAGNOSIS — E118 Type 2 diabetes mellitus with unspecified complications: Secondary | ICD-10-CM

## 2014-07-14 DIAGNOSIS — J449 Chronic obstructive pulmonary disease, unspecified: Secondary | ICD-10-CM

## 2014-07-14 DIAGNOSIS — J189 Pneumonia, unspecified organism: Secondary | ICD-10-CM | POA: Insufficient documentation

## 2014-07-14 DIAGNOSIS — I10 Essential (primary) hypertension: Secondary | ICD-10-CM

## 2014-07-14 DIAGNOSIS — E876 Hypokalemia: Secondary | ICD-10-CM

## 2014-07-14 DIAGNOSIS — R059 Cough, unspecified: Secondary | ICD-10-CM | POA: Insufficient documentation

## 2014-07-14 LAB — BASIC METABOLIC PANEL
BUN: 21 mg/dL (ref 6–23)
CHLORIDE: 101 meq/L (ref 96–112)
CO2: 32 mEq/L (ref 19–32)
Calcium: 10.3 mg/dL (ref 8.4–10.5)
Creatinine, Ser: 1.16 mg/dL (ref 0.40–1.20)
GFR: 58.86 mL/min — ABNORMAL LOW (ref >60.00)
GLUCOSE: 84 mg/dL (ref 70–99)
POTASSIUM: 3.7 meq/L (ref 3.5–5.1)
Sodium: 137 mEq/L (ref 135–145)

## 2014-07-14 LAB — HEMOGLOBIN A1C: Hgb A1c MFr Bld: 6 % (ref 4.6–6.5)

## 2014-07-14 MED ORDER — HYDROCHLOROTHIAZIDE 12.5 MG PO CAPS: 12.5000 mg | ORAL_CAPSULE | Freq: Every day | ORAL | Status: DC

## 2014-07-14 MED ORDER — POTASSIUM CHLORIDE 20 MEQ PO PACK: 20.0000 meq | PACK | Freq: Two times a day (BID) | ORAL | Status: DC

## 2014-07-14 MED ORDER — AZITHROMYCIN 500 MG PO TABS: 500.0000 mg | ORAL_TABLET | Freq: Every day | ORAL | Status: DC

## 2014-07-14 MED ORDER — FLUTICASONE FUROATE-VILANTEROL 100-25 MCG/INH IN AEPB: 1.0000 | INHALATION_SPRAY | Freq: Every day | RESPIRATORY_TRACT | Status: DC

## 2014-07-14 MED ORDER — DONEPEZIL HCL 5 MG PO TABS: 5.0000 mg | ORAL_TABLET | Freq: Every day | ORAL | Status: DC

## 2014-07-14 NOTE — Assessment & Plan Note (Signed)
She is allergic to PCN and levaquin Will treat with zithromax

## 2014-07-14 NOTE — Assessment & Plan Note (Signed)
Her A1C has been low, will discontinue [place]

## 2014-07-14 NOTE — Assessment & Plan Note (Signed)
CXR is + for an infiltrate Will treat for PNA and COPD

## 2014-07-14 NOTE — Assessment & Plan Note (Addendum)
Will treat with breo - she was given samples and was taught how to use the inhaler, she demonstrated proficiency with its use She was given a sample and was shown how to use it - she demonstrated proficiency with its use

## 2014-07-14 NOTE — Patient Instructions (Signed)

## 2014-07-14 NOTE — Progress Notes (Signed)
Subjective:    Patient ID: Sheila Cisneros, female    DOB: March 14, 1941, 74 y.o.   MRN: [id number]  Diabetes Pertinent negatives for hypoglycemia include no headaches or sweats. Pertinent negatives for diabetes include no chest pain, no fatigue and no weight loss.  Cough This is a new problem. The current episode started 1 to 4 weeks ago. The problem has been unchanged. The cough is productive of purulent sputum. Associated symptoms include shortness of breath and wheezing. Pertinent negatives include no chest pain, chills, ear congestion, ear pain, fever, headaches, heartburn, hemoptysis, myalgias, nasal congestion, postnasal drip, rash, rhinorrhea, sore throat, sweats or weight loss. She has tried a beta-agonist inhaler for the symptoms. The treatment provided mild relief. Her past medical history is significant for asthma and COPD. There is no history of bronchiectasis, bronchitis, emphysema, environmental allergies or pneumonia.      Review of Systems  Constitutional: Negative.  Negative for fever, chills, weight loss, diaphoresis, appetite change and fatigue.  HENT: Negative.  Negative for ear pain, postnasal drip, rhinorrhea, sore throat, trouble swallowing and voice change.   Eyes: Negative.   Respiratory: Positive for cough, shortness of breath and wheezing. Negative for apnea, hemoptysis, choking, chest tightness and stridor.   Cardiovascular: Negative.  Negative for chest pain, palpitations and leg swelling.  Gastrointestinal: Negative.  Negative for heartburn, nausea, vomiting, abdominal pain, diarrhea, constipation and blood in stool.  Endocrine: Negative.   Genitourinary: Negative.   Musculoskeletal: Negative.  Negative for myalgias, back pain, gait problem and neck pain.  Skin: Negative.  Negative for rash.  Allergic/Immunologic: Negative.  Negative for environmental allergies.  Neurological: Negative.  Negative for headaches.  Hematological: Negative.  Negative for adenopathy.  Does not bruise/bleed easily.  Psychiatric/Behavioral: Negative.        Objective:   Physical Exam  Constitutional: She is oriented to person, place, and time. She appears well-developed and well-nourished.  Non-toxic appearance. She does not have a sickly appearance. She does not appear ill. No distress.  HENT:  Head: Normocephalic and atraumatic.  Mouth/Throat: Oropharynx is clear and moist. No oropharyngeal exudate.  Eyes: Conjunctivae are normal. Right eye exhibits no discharge. Left eye exhibits no discharge. No scleral icterus.  Neck: Normal range of motion. Neck supple. No JVD present. No tracheal deviation present. No thyromegaly present.  Cardiovascular: Normal rate, regular rhythm, normal heart sounds and intact distal pulses.  Exam reveals no gallop and no friction rub.   No murmur heard. Pulmonary/Chest: Effort normal. No accessory muscle usage or stridor. No respiratory distress. She has no decreased breath sounds. She has wheezes in the right middle field and the left middle field. She has rhonchi in the right lower field and the left lower field. She has no rales. She exhibits no tenderness.  Abdominal: Soft. Bowel sounds are normal. She exhibits no distension and no mass. There is no tenderness. There is no rebound and no guarding.  Musculoskeletal: Normal range of motion. She exhibits no edema or tenderness.  Lymphadenopathy:    She has no cervical adenopathy.  Neurological: She is oriented to person, place, and time.  Skin: Skin is warm and dry. No rash noted. She is not diaphoretic. No erythema. No pallor.  Vitals reviewed.    Lab Results  Component Value Date   WBC 12.1* 03/15/2014   HGB 11.3* 03/15/2014   HCT 34.5* 03/15/2014   PLT 328.0 03/15/2014   GLUCOSE 81 03/15/2014   CHOL 132 03/15/2014   TRIG 65.0 03/15/2014  HDL 58.80 03/15/2014   LDLCALC 60 03/15/2014   ALT 12 03/15/2014   AST 18 03/15/2014   NA 135 03/15/2014   K 3.6 03/15/2014   CL 99  03/15/2014   CREATININE 1.1 03/15/2014   BUN 14 03/15/2014   CO2 26 03/15/2014   TSH 1.14 03/15/2014   INR 1.17 06/04/2013   HGBA1C 6.1 03/15/2014   MICROALBUR 1.5 03/15/2014       Assessment & Plan:

## 2014-07-14 NOTE — Progress Notes (Signed)
Pre visit review using our clinic review tool, if applicable. No additional management support is needed unless otherwise documented below in the visit note. 

## 2014-07-15 NOTE — Addendum Note (Signed)
Addended by: Etta GrandchildJONES, Alisyn Lequire L on: 07/15/2014 12:29 PM   Modules accepted: Kipp BroodSmartSet

## 2014-08-03 ENCOUNTER — Ambulatory Visit: Payer: Medicare Other | Admitting: Internal Medicine

## 2014-08-10 ENCOUNTER — Other Ambulatory Visit: Payer: Self-pay | Admitting: Internal Medicine

## 2014-08-10 DIAGNOSIS — Z1231 Encounter for screening mammogram for malignant neoplasm of breast: Secondary | ICD-10-CM

## 2014-08-11 ENCOUNTER — Ambulatory Visit (INDEPENDENT_AMBULATORY_CARE_PROVIDER_SITE_OTHER): Payer: Medicare Other | Admitting: Internal Medicine

## 2014-08-11 ENCOUNTER — Encounter: Payer: Self-pay | Admitting: Internal Medicine

## 2014-08-11 VITALS — BP 126/80 | HR 74 | Ht 59.0 in | Wt 99.8 lb

## 2014-08-11 DIAGNOSIS — K219 Gastro-esophageal reflux disease without esophagitis: Secondary | ICD-10-CM | POA: Diagnosis not present

## 2014-08-11 DIAGNOSIS — J479 Bronchiectasis, uncomplicated: Secondary | ICD-10-CM

## 2014-08-11 NOTE — Patient Instructions (Signed)
Order- in[employer]ourage  pneumatic vest for dx bronchiectasis   Try using this 3 times daily to help clear your chest congestion

## 2014-08-11 NOTE — Assessment & Plan Note (Signed)
She minimizes symptoms and it can be difficult to tell whether she is having an exacerbation. She admits that she coughs through most days and that she usually coughs up thick sputum which can range from light yellow to brown. No fever or blood. She is not keeping her airways clear and gets only temporary benefit from appropriate antibiotics, flutter device, chest PT. We continued emphasized reflux precautions. Plan-or conservative measures have not been adequate and I believe she would be a good candidate for a pneumatic vest for pulmonary toilet

## 2014-08-11 NOTE — Progress Notes (Signed)
Patient ID: Sheila Cisneros, female    DOB: [birth year], 74 y.o.   MRN: [id number] HPI  07/31/10- 74 yoF followed for chronic bronchitis/ bronchiectasis and hx rhinosinusitis. Never smoker. Last here- January 30, 2010. She remained congested throough the winter, but she put up with it. No events needing medical visits. Pollen now makes cough somewhat more constant. Rarely productive. Denies fever or night sweats. Nebulizer still helps, used about twice daily. Denies  Chest pain or headache.   09/05/10- Chronic bronchitis, bronchiectasis, hx rhinosinusitis Reports increased chest congestion,producitve cough dark yellow.  Denies fever or sweat.She apparently didn't actually get a script for Daliresp last visit. We discussed side effects again. She is trying to exercise- water aerobics and walking.  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  Acute visit. Cough worse in past week. Was put on prednisone 2 weeks ago for hip- didn't help chest-  pred ended yesterday. Occasional hot flash but no sustained fever. Continues Daliresp.  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  She notices a little routine cough but it has been more productive with some darkening sputum and thicker mucus in the last week. No fever, sore throat or sweats. Sinuses feel normal. We had previously tried [employer]- she remembers it kept her awake at night. We talked about trying it again if necessary to suppress her repeated bronchitis.  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  She comes for an acute visit. She has been around a lot of family recently. 2 days ago she thinks she went outdoors too quickly after her bath and may have gotten chills. Next on waking, she noted sore throat no fever no stomach upset. No  sinus congestion but sniffing a lot. Mainly complains of chest congestion and rattle cough productive of thick yellow sputum which has been sometimes brown. She started herself on doxycycline. We discussed her earlier experience with Daliresp. She never took it, although she bought it, because she disliked the printed side effect list. I went over those and my own experience with this drug again today. Has had flu shot this year.  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  Has had flu vaccine. After last visit she tried Daliresp but found it too expensive. She keeps her persistent deep chest rattle and coughs scant gray sputum. Does not really feel badly and is able to exercise. Does sometimes cough until she retches. Does not recognize reflux or sinus pain/drainage. She is using her Flutter device twice daily for pulmonary toilet.  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  CT 08/25/10-  1. Bilateral cylindrical type atelectasis within the lower lobes.  2. Bilateral lower lobe airspace densities, likely the sequela of  inflammation or infection. Suggest short-term follow-up  examination to ensure resolution.  3. Scattered small nodules within the right upper lobe are also  likely related to inflammation or infection. A short-term follow-  up examination in 3 months is recommended to ensure resolution.  Original Report Authenticated By: Rosealee AlbeeAYLOR H. STROUD, M.D.  Had flu vax. Mucomyst is not available - prescribed last visit.  Felt well after last here until took chill first of this week. Sore throat, increased productive cough and congestion. Head stuffy, no HA. Sputum cx  02/26/11- grew E. Coli. Septra was called in and helped, after refill she has now been off x 1 week.  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  At last office visit she stopped Daliresp as ineffective. We had given Ceftin for Escherichia coli on sputum culture. She admits cough has been better but remains productive of yellow sputum off and on. Denies chest pain, fever, chills, enlarged nodes.  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 Recently had flare up last week due to pollen but has gotten better now. She noted mostly nasal stuffiness. Not much cough or wheeze and no recent need for antibiotics.  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  Patient states that cough has been "off and on", depends on weather. pt c/o head congestion today Her chronic cough is very sensitive to weather changes. She asked me to evaluate a bulge on her back, demonstrating scoliosis with protrusion of right posterior rib cage. Relatively little productive cough recently and no fever or blood. CXR 02/04/12-reviewed IMPRESSION:  Stable basilar scarring. Stable cardiomegaly. No active lung  disease.  Original Report Authenticated By: Juline PatchPAUL D. BARRY, M.D.   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 FOLLOWS FOR:  States breathing "comes and goes."  Congestion this am with gray mucus and wheezing.denies having cold or acute infection. Low back pain, pending injection.  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 Follow up.  C/o back pain and has taken pain med for this.  Prod cough with dark mucus since yesterday.  No f/c/s, wheezing, chest tightness, or chest pain.   CXR 11/09/12 IMPRESSION:  Stable chronic basilar lung disease. No acute cardiopulmonary  process.  Original Report Authenticated By: Carey BullocksWilliam Veazey,  M.D.  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 Sister-in-law is here FOLLOWS FOR:  In hospital 8/26 - 8/31 with altered mental status attributed to pain meds.  Still complaining of swelling in both feet/ankles, SOB and cough w/grayish mucus Back pain remains a problem. Brain MRI positive for air-fluid levels in sinuses/ acute sinusitis. She denies facial pain or headache now after treatment with Cleocin and Biaxin. CXR 1 V  8/  /14 IMPRESSION:  1. Streaky bibasilar atelectasis and low lung volumes.  2. 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  Original Report Authenticated By: Britta MccreedySusan Turner, M.D.  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 Sister-in-law is here FOLLOWS FOR: continues to cough-productive at times-clear to brown in color. She reports fainting after getting out of a sauna yesterday. Sounds postural after heat. Ok now.  CXR 06/30/13 IMPRESSION:  No acute cardiopulmonary disease.  Electronically Signed  By: Amie Portlandavid Ormond M.D.  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 Most of her attention is really on weakness and chronic back pain. Using nebulizer 2 or 3 times a day. She had been  hospitalized earlier this year for GI bleed.  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 Has Flutter but not using. Had flu vax. Occasional nasal stuffy. Limited by chronic back pain/ scoliosis.   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  c/o intermittent chest tightness, prod cough with yellow/brown thick mucus worse when laying down.  Pt stopped albuterol HFA d/t shaking, but likes Breo.  Took antibiotic from Dr. Yetta BarreJones at her last office visit in late March. Like all antibiotics it has helped her briefly but then the productive cough starts again. She has tried to be diligent with a Flutter device for pulmonary toilet and with various chest physiotherapy techniques we have taught her. She has been unable to improve her chronic productive cough and chest rattle associated with her known chronic bronchiectasis known from CT scans as far back as 2012. CXR 07/14/14 IMPRESSION: Patchy infiltrate posterior left base. Mild scarring lateral right base. Sizable hiatal hernia. These results will be called to the ordering clinician or representative by the Radiologist Assistant, and communication documented in the PACS or zVision Dashboard. Electronically Signed  By: Bretta BangWilliam Woodruff III M.D.  On: 07/14/2014 14:38   ROS- see HPI   Constitutional:   No-   weight loss, night sweats, fevers, chills, fatigue, lassitude. HEENT:   No-  headaches, difficulty swallowing, tooth/dental problems, sore throat,       No-  sneezing, itching, ear ache, +nasal congestion, post nasal drip,  CV:  No-   chest pain, orthopnea, PND, swelling in lower extremities, anasarca, dizziness, palpitations Resp: No- acute  shortness of breath with exertion or at rest.              +  productive cough,  + non-productive cough,   No-  coughing up of blood.              +change in color of mucus.  No- wheezing.   Skin: No-   rash or lesions. GI:  No-   heartburn, indigestion, abdominal pain, nausea, vomiting, GU: MS:  No-   joint pain or swelling.  +back pain Neuro- nothing unusual Psych:  No- change in mood or affect. No depression or anxiety.  No memory loss.  Objective:   Physical Exam   General- +drowsy from pain medication for back, Oriented, Affect-appropriate, Distress- none acute   Thin                   Skin- rash-none, lesions- none, excoriation- none Lymphadenopathy- none Head- atraumatic            Eyes- Gross vision intact, PERRLA, conjunctivae clear secretions            Ears- Hearing, canals-normal            Nose- Clear, no-Septal dev, mucus, polyps, erosion, perforation, .            Throat- Mallampati III , mucosa clear , drainage- none, tonsils- atrophic Neck- flexible , trachea midline, no stridor , thyroid nl, carotid no bruit Chest - symmetrical excursion , unlabored  Heart/CV- RRR , no murmur , no gallop  , no rub, nl s1 s2                           - JVD- none , edema+1, stasis changes- none, varices- none           Lung-  +loose, deep, raspy cough, rhonchi+few, no wheeze, dullness-none, rub- none, unlabored;  room air saturation 97% today           Chest wall- +right posterior lower thoracic ribs bulge reflecting scoliosis Abd-  Br/ Gen/ Rectal- Not done, not indicated Extrem- cyanosis- none, clubbing, none, atrophy- none, strength- nl Neuro- grossly intact to observation

## 2014-08-11 NOTE — Assessment & Plan Note (Signed)
She has a known hiatal hernia and I believe chronic intermittent reflux is the most likely explanation for her bronchiectasis, although she does not recognize reflux events. Plan-continue to emphasize reflux control measures as a lifestyle.

## 2014-08-12 ENCOUNTER — Ambulatory Visit: Payer: Self-pay | Admitting: Internal Medicine

## 2014-08-17 ENCOUNTER — Ambulatory Visit (HOSPITAL_COMMUNITY)
Admission: RE | Admit: 2014-08-17 | Discharge: 2014-08-17 | Disposition: A | Discharge status: Home / Self Care | Payer: Medicare Other | Source: Ambulatory Visit | Attending: Internal Medicine | Admitting: Internal Medicine

## 2014-08-17 DIAGNOSIS — Z1231 Encounter for screening mammogram for malignant neoplasm of breast: Secondary | ICD-10-CM | POA: Insufficient documentation

## 2014-08-17 LAB — HM MAMMOGRAPHY

## 2014-08-18 NOTE — Addendum Note (Signed)
Addended by: Rhyker Silversmith, THEtta GrandchildMAS L on: 08/18/2014 07:33 AM   Modules accepted: Kipp BroodSmartSet

## 2014-08-25 DIAGNOSIS — J479 Bronchiectasis, uncomplicated: Secondary | ICD-10-CM | POA: Diagnosis not present

## 2014-08-26 LAB — HM MAMMOGRAPHY

## 2014-09-15 ENCOUNTER — Ambulatory Visit (INDEPENDENT_AMBULATORY_CARE_PROVIDER_SITE_OTHER): Payer: Medicare Other | Admitting: Internal Medicine

## 2014-09-15 DIAGNOSIS — J449 Chronic obstructive pulmonary disease, unspecified: Secondary | ICD-10-CM | POA: Diagnosis not present

## 2014-09-15 LAB — PULMONARY FUNCTION TEST
DL/VA % pred: 96 %
DL/VA: 4.01 ml/min/mmHg/L
DLCO unc % pred: 57 %
DLCO unc: 10.39 ml/min/mmHg
FEF 25-75 Post: 1.1 L/sec
FEF 25-75 Pre: 1.15 L/sec
FEF2575-%Change-Post: -4 %
FEF2575-%PRED-POST: 87 %
FEF2575-%PRED-PRE: 91 %
FEV1-%CHANGE-POST: -1 %
FEV1-%PRED-POST: 91 %
FEV1-%PRED-PRE: 93 %
FEV1-POST: 1.25 L
FEV1-PRE: 1.27 L
FEV1FVC-%Change-Post: 2 %
FEV1FVC-%PRED-PRE: 104 %
FEV6-%CHANGE-POST: -3 %
FEV6-%PRED-POST: 89 %
FEV6-%Pred-Pre: 93 %
FEV6-Post: 1.51 L
FEV6-Pre: 1.57 L
FEV6FVC-%PRED-POST: 105 %
FEV6FVC-%Pred-Pre: 105 %
FVC-%Change-Post: -3 %
FVC-%PRED-PRE: 88 %
FVC-%Pred-Post: 85 %
FVC-Post: 1.51 L
FVC-Pre: 1.57 L
POST FEV6/FVC RATIO: 100 %
PRE FEV1/FVC RATIO: 81 %
PRE FEV6/FVC RATIO: 100 %
Post FEV1/FVC ratio: 83 %
RV % PRED: 84 %
RV: 1.71 L
TLC % pred: 75 %
TLC: 3.29 L

## 2014-09-15 NOTE — Progress Notes (Signed)
PFT done today. 

## 2014-09-25 DIAGNOSIS — J479 Bronchiectasis, uncomplicated: Secondary | ICD-10-CM | POA: Diagnosis not present

## 2014-10-25 DIAGNOSIS — J479 Bronchiectasis, uncomplicated: Secondary | ICD-10-CM | POA: Diagnosis not present

## 2014-11-15 ENCOUNTER — Other Ambulatory Visit: Payer: Self-pay

## 2014-11-15 DIAGNOSIS — F0390 Unspecified dementia without behavioral disturbance: Secondary | ICD-10-CM

## 2014-11-15 DIAGNOSIS — I10 Essential (primary) hypertension: Secondary | ICD-10-CM

## 2014-11-15 DIAGNOSIS — E876 Hypokalemia: Secondary | ICD-10-CM

## 2014-11-15 MED ORDER — POTASSIUM CHLORIDE 20 MEQ PO PACK: 20.0000 meq | PACK | Freq: Two times a day (BID) | ORAL | Status: DC

## 2014-11-15 MED ORDER — VERAPAMIL HCL 240 MG (CO) PO TB24: 240.0000 mg | ORAL_TABLET | Freq: Every day | ORAL | Status: DC

## 2014-11-15 MED ORDER — SIMVASTATIN 20 MG PO TABS: 20.0000 mg | ORAL_TABLET | Freq: Every day | ORAL | Status: DC

## 2014-11-15 MED ORDER — HYDROCHLOROTHIAZIDE 12.5 MG PO CAPS: 12.5000 mg | ORAL_CAPSULE | Freq: Every day | ORAL | Status: DC

## 2014-11-15 MED ORDER — DONEPEZIL HCL 5 MG PO TABS: 5.0000 mg | ORAL_TABLET | Freq: Every day | ORAL | Status: DC

## 2014-11-25 ENCOUNTER — Other Ambulatory Visit: Payer: Self-pay | Admitting: Geriatric Medicine

## 2014-11-25 DIAGNOSIS — E876 Hypokalemia: Secondary | ICD-10-CM

## 2014-11-25 DIAGNOSIS — J479 Bronchiectasis, uncomplicated: Secondary | ICD-10-CM | POA: Diagnosis not present

## 2014-11-25 MED ORDER — POTASSIUM CHLORIDE 20 MEQ PO PACK: 20.0000 meq | PACK | Freq: Two times a day (BID) | ORAL | Status: DC

## 2014-12-26 DIAGNOSIS — J479 Bronchiectasis, uncomplicated: Secondary | ICD-10-CM | POA: Diagnosis not present

## 2015-01-25 DIAGNOSIS — J479 Bronchiectasis, uncomplicated: Secondary | ICD-10-CM | POA: Diagnosis not present

## 2015-02-10 ENCOUNTER — Ambulatory Visit (INDEPENDENT_AMBULATORY_CARE_PROVIDER_SITE_OTHER): Payer: Medicare Other | Admitting: Internal Medicine

## 2015-02-10 ENCOUNTER — Encounter: Payer: Self-pay | Admitting: Internal Medicine

## 2015-02-10 VITALS — BP 144/82 | HR 86 | Ht 59.0 in | Wt 102.2 lb

## 2015-02-10 DIAGNOSIS — M4120 Other idiopathic scoliosis, site unspecified: Secondary | ICD-10-CM | POA: Diagnosis not present

## 2015-02-10 DIAGNOSIS — J479 Bronchiectasis, uncomplicated: Secondary | ICD-10-CM | POA: Diagnosis not present

## 2015-02-10 NOTE — Patient Instructions (Signed)
Ok to continue using the VEST up to 4 times daily, as needed  Sample to try Anoro Ellipta inhaler   Inhale 1 puff, once daily     See if it helps. If you like it you can call us for a prescription.

## 2015-02-10 NOTE — Progress Notes (Signed)
Patient ID: Sheila Cisneros, female    DOB: [birth year], 74 y.o.   MRN: [id number] HPI  07/31/10- 70 yoF followed for chronic bronchitis/ bronchiectasis and hx rhinosinusitis. Never smoker. Last here- January 30, 2010. She remained congested throough the winter, but she put up with it. No events needing medical visits. Pollen now makes cough somewhat more constant. Rarely productive. Denies fever or night sweats. Nebulizer still helps, used about twice daily. Denies  Chest pain or headache.   09/05/10- Chronic bronchitis, bronchiectasis, hx rhinosinusitis Reports increased chest congestion,producitve cough dark yellow.  Denies fever or sweat.She apparently didn't actually get a script for Daliresp last visit. We discussed side effects again. She is trying to exercise- water aerobics and walking.  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  Acute visit. Cough worse in past week. Was put on prednisone 2 weeks ago for hip- didn't help chest-  pred ended yesterday. Occasional hot flash but no sustained fever. Continues Daliresp.  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  She notices a little routine cough but it has been more productive with some darkening sputum and thicker mucus in the last week. No fever, sore throat or sweats. Sinuses feel normal. We had previously tried [employer]- she remembers it kept her awake at night. We talked about trying it again if necessary to suppress her repeated bronchitis.  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  She comes for an acute visit. She has been around a lot of family recently. 2 days ago she thinks she went outdoors too quickly after her bath and may have gotten chills. Next on waking, she noted sore throat no fever no stomach upset. No  sinus congestion but sniffing a lot. Mainly complains of chest congestion and rattle cough productive of thick yellow sputum which has been sometimes brown. She started herself on doxycycline. We discussed her earlier experience with Daliresp. She never took it, although she bought it, because she disliked the printed side effect list. I went over those and my own experience with this drug again today. Has had flu shot this year.  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  Has had flu vaccine. After last visit she tried Daliresp but found it too expensive. She keeps her persistent deep chest rattle and coughs scant gray sputum. Does not really feel badly and is able to exercise. Does sometimes cough until she retches. Does not recognize reflux or sinus pain/drainage. She is using her Flutter device twice daily for pulmonary toilet.  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  CT 08/25/10-  1. Bilateral cylindrical type atelectasis within the lower lobes.  2. Bilateral lower lobe airspace densities, likely the sequela of  inflammation or infection. Suggest short-term follow-up  examination to ensure resolution.  3. Scattered small nodules within the right upper lobe are also  likely related to inflammation or infection. A short-term follow-  up examination in 3 months is recommended to ensure resolution.  Original Report Authenticated By: Rosealee AlbeeAYLOR H. STROUD, M.D.  Had flu vax. Mucomyst is not available - prescribed last visit.  Felt well after last here until took chill first of this week. Sore throat, increased productive cough and congestion. Head stuffy, no HA. Sputum cx  02/26/11- grew E. Coli. Septra was called in and helped, after refill she has now been off x 1 week.  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  At last office visit she stopped Daliresp as ineffective. We had given Ceftin for Escherichia coli on sputum culture. She admits cough has been better but remains productive of yellow sputum off and on. Denies chest pain, fever, chills, enlarged nodes.  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 Recently had flare up last week due to pollen but has gotten better now. She noted mostly nasal stuffiness. Not much cough or wheeze and no recent need for antibiotics.  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  Patient states that cough has been "off and on", depends on weather. pt c/o head congestion today Her chronic cough is very sensitive to weather changes. She asked me to evaluate a bulge on her back, demonstrating scoliosis with protrusion of right posterior rib cage. Relatively little productive cough recently and no fever or blood. CXR 02/04/12-reviewed IMPRESSION:  Stable basilar scarring. Stable cardiomegaly. No active lung  disease.  Original Report Authenticated By: Juline PatchPAUL D. BARRY, M.D.   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 FOLLOWS FOR:  States breathing "comes and goes."  Congestion this am with gray mucus and wheezing.denies having cold or acute infection. Low back pain, pending injection.  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 Follow up.  C/o back pain and has taken pain med for this.  Prod cough with dark mucus since yesterday.  No f/c/s, wheezing, chest tightness, or chest pain.   CXR 11/09/12 IMPRESSION:  Stable chronic basilar lung disease. No acute cardiopulmonary  process.  Original Report Authenticated By: Carey BullocksWilliam Veazey,  M.D.  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 Sister-in-law is here FOLLOWS FOR:  In hospital 8/26 - 8/31 with altered mental status attributed to pain meds.  Still complaining of swelling in both feet/ankles, SOB and cough w/grayish mucus Back pain remains a problem. Brain MRI positive for air-fluid levels in sinuses/ acute sinusitis. She denies facial pain or headache now after treatment with Cleocin and Biaxin. CXR 1 V  8/  /14 IMPRESSION:  1. Streaky bibasilar atelectasis and low lung volumes.  2. 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  Original Report Authenticated By: Britta MccreedySusan Turner, M.D.  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 Sister-in-law is here FOLLOWS FOR: continues to cough-productive at times-clear to brown in color. She reports fainting after getting out of a sauna yesterday. Sounds postural after heat. Ok now.  CXR 06/30/13 IMPRESSION:  No acute cardiopulmonary disease.  Electronically Signed  By: Amie Portlandavid Ormond M.D.  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 Most of her attention is really on weakness and chronic back pain. Using nebulizer 2 or 3 times a day. She had been  hospitalized earlier this year for GI bleed.  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 Has Flutter but not using. Had flu vax. Occasional nasal stuffy. Limited by chronic back pain/ scoliosis.   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  c/o intermittent chest tightness, prod cough with yellow/brown thick mucus worse when laying down.  Pt stopped albuterol HFA d/t shaking, but likes Breo.  Took antibiotic from Dr. Yetta Barre at her last office visit in late March. Like all antibiotics it has helped her briefly but then the productive cough starts again. She has tried to be diligent with a Flutter device for pulmonary toilet and with various chest physiotherapy techniques we have taught her. She has been unable to improve her chronic productive cough and chest rattle associated with her known chronic bronchiectasis known from CT scans as far back as 2012. CXR 07/14/14 IMPRESSION: Patchy infiltrate posterior left base. Mild scarring lateral right base. Sizable hiatal hernia. These results will be called to the ordering clinician or representative by the Radiologist Assistant, and communication documented in the PACS or zVision Dashboard. Electronically Signed  By: Bretta Bang III M.D.  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 Continues to have occasional cough-yellow in color and chest tightness No acute event. Had flu vaccine. Ran out of Breo inhaler.VEST does help, used up to 4 times daily.  ROS- see HPI    Constitutional:   No-   weight loss, night sweats, fevers, chills, fatigue, lassitude. HEENT:   No-  headaches, difficulty swallowing, tooth/dental problems, sore throat,       No-  sneezing, itching, ear ache, +nasal congestion, post nasal drip,  CV:  No-   chest pain, orthopnea, PND, swelling in lower extremities, anasarca, dizziness, palpitations Resp: No- acute  shortness of breath with exertion or at rest.              +  productive cough,  + non-productive cough,  No-  coughing up of blood.              +change in color of mucus.  No- wheezing.   Skin: No-   rash or lesions. GI:  No-   heartburn, indigestion, abdominal pain, nausea, vomiting, GU: MS:  No-   joint pain or swelling.  +back pain Neuro- nothing unusual Psych:  No- change in mood or affect. No depression or anxiety.  No memory loss.  Objective:   Physical Exam   General- +drowsy from pain medication for back, Oriented, Affect-appropriate, Distress- none acute   Thin                   Skin- rash-none, lesions- none, excoriation- none Lymphadenopathy- none Head- atraumatic            Eyes- Gross vision intact, PERRLA, conjunctivae clear secretions  Ears- Hearing, canals-normal            Nose- Clear, no-Septal dev, mucus, polyps, erosion, perforation, .            Throat- Mallampati III , mucosa clear , drainage- none, tonsils- atrophic Neck- flexible , trachea midline, no stridor , thyroid nl, carotid no bruit Chest - symmetrical excursion , unlabored           Heart/CV- RRR , no murmur , no gallop  , no rub, nl s1 s2                           - JVD- none , edema+1, stasis changes- none, varices- none           Lung-  + usual squeaks and mild rattle, unlabored, rhonchi+few, no wheeze, dullness-none, rub- none, unlabored;  room air saturation 97% today           Chest wall- +right posterior lower thoracic ribs bulge reflecting scoliosis Abd-  Br/ Gen/ Rectal- Not done, not indicated Extrem- cyanosis- none,  clubbing, none, atrophy- none, strength- nl Neuro- grossly intact to observation

## 2015-02-13 NOTE — Assessment & Plan Note (Signed)
Thoracic distortion with ribs creating bulge to the right

## 2015-02-13 NOTE — Assessment & Plan Note (Signed)
Current measures are keeping her controlled. I don't expect her ever to be completely clear but she has avoided exacerbation. Plan-sample Anoroinhaler for trial, continue VEST

## 2015-02-25 DIAGNOSIS — J479 Bronchiectasis, uncomplicated: Secondary | ICD-10-CM | POA: Diagnosis not present

## 2015-03-12 ENCOUNTER — Other Ambulatory Visit: Payer: Self-pay

## 2015-03-12 MED ORDER — LORATADINE 10 MG PO TABS: 10.0000 mg | ORAL_TABLET | Freq: Every day | ORAL | Status: DC

## 2015-03-17 ENCOUNTER — Telehealth: Payer: Self-pay | Admitting: Internal Medicine

## 2015-03-17 NOTE — Telephone Encounter (Signed)
Please advise 

## 2015-03-17 NOTE — Telephone Encounter (Signed)
She needs to be seen.

## 2015-03-17 NOTE — Telephone Encounter (Signed)
Patient  Calling to get a refill of JANUMET XR [phone] MG TB24 [[id number]][[id number]] to [facility] [address]. She is asking for a cheaper option.

## 2015-03-18 DIAGNOSIS — J310 Chronic rhinitis: Secondary | ICD-10-CM | POA: Diagnosis not present

## 2015-03-23 ENCOUNTER — Ambulatory Visit (INDEPENDENT_AMBULATORY_CARE_PROVIDER_SITE_OTHER): Payer: Medicare Other | Admitting: Internal Medicine

## 2015-03-23 ENCOUNTER — Other Ambulatory Visit (INDEPENDENT_AMBULATORY_CARE_PROVIDER_SITE_OTHER): Payer: Medicare Other

## 2015-03-23 ENCOUNTER — Ambulatory Visit (INDEPENDENT_AMBULATORY_CARE_PROVIDER_SITE_OTHER)
Admission: RE | Admit: 2015-03-23 | Discharge: 2015-03-23 | Disposition: A | Discharge status: Home / Self Care | Payer: Medicare Other | Source: Ambulatory Visit | Attending: Internal Medicine | Admitting: Internal Medicine

## 2015-03-23 ENCOUNTER — Encounter: Payer: Self-pay | Admitting: Internal Medicine

## 2015-03-23 VITALS — BP 128/88 | HR 80 | Temp 98.4°F | Resp 20 | Ht 60.0 in | Wt 106.0 lb

## 2015-03-23 DIAGNOSIS — R05 Cough: Secondary | ICD-10-CM | POA: Diagnosis not present

## 2015-03-23 DIAGNOSIS — E118 Type 2 diabetes mellitus with unspecified complications: Secondary | ICD-10-CM

## 2015-03-23 DIAGNOSIS — D510 Vitamin B12 deficiency anemia due to intrinsic factor deficiency: Secondary | ICD-10-CM

## 2015-03-23 DIAGNOSIS — I10 Essential (primary) hypertension: Secondary | ICD-10-CM | POA: Diagnosis not present

## 2015-03-23 DIAGNOSIS — E785 Hyperlipidemia, unspecified: Secondary | ICD-10-CM

## 2015-03-23 DIAGNOSIS — J189 Pneumonia, unspecified organism: Secondary | ICD-10-CM | POA: Insufficient documentation

## 2015-03-23 DIAGNOSIS — R059 Cough, unspecified: Secondary | ICD-10-CM

## 2015-03-23 DIAGNOSIS — J690 Pneumonitis due to inhalation of food and vomit: Secondary | ICD-10-CM

## 2015-03-23 DIAGNOSIS — J449 Chronic obstructive pulmonary disease, unspecified: Secondary | ICD-10-CM

## 2015-03-23 LAB — COMPREHENSIVE METABOLIC PANEL
ALBUMIN: 3.8 g/dL (ref 3.5–5.2)
ALK PHOS: 163 U/L — AB (ref 39–117)
ALT: 26 U/L (ref 0–35)
AST: 28 U/L (ref 0–37)
BILIRUBIN TOTAL: 0.3 mg/dL (ref 0.2–1.2)
BUN: 21 mg/dL (ref 6–23)
CALCIUM: 10.6 mg/dL — AB (ref 8.4–10.5)
CO2: 33 mEq/L — ABNORMAL HIGH (ref 19–32)
CREATININE: 0.89 mg/dL (ref 0.40–1.20)
Chloride: 105 mEq/L (ref 96–112)
GFR: 79.76 mL/min (ref >60.00)
Glucose, Bld: 81 mg/dL (ref 70–99)
Potassium: 4.4 mEq/L (ref 3.5–5.1)
SODIUM: 144 meq/L (ref 135–145)
TOTAL PROTEIN: 8 g/dL (ref 6.0–8.3)

## 2015-03-23 LAB — CBC WITH DIFFERENTIAL/PLATELET
Basophils Absolute: 0 10*3/uL (ref 0.0–0.1)
Basophils Relative: 0.4 % (ref 0.0–3.0)
EOS PCT: 2.6 % (ref 0.0–5.0)
Eosinophils Absolute: 0.2 10*3/uL (ref 0.0–0.7)
HCT: 38.3 % (ref 36.0–46.0)
Hemoglobin: 12.4 g/dL (ref 12.0–15.0)
LYMPHS ABS: 3.3 10*3/uL (ref 0.7–4.0)
Lymphocytes Relative: 37 % (ref 12.0–46.0)
MCHC: 32.3 g/dL (ref 30.0–36.0)
MCV: 91.4 fl (ref 78.0–100.0)
MONO ABS: 0.7 10*3/uL (ref 0.1–1.0)
Monocytes Relative: 7.4 % (ref 3.0–12.0)
NEUTROS ABS: 4.7 10*3/uL (ref 1.4–7.7)
NEUTROS PCT: 52.6 % (ref 43.0–77.0)
PLATELETS: 292 10*3/uL (ref 150.0–400.0)
RBC: 4.19 Mil/uL (ref 3.87–5.11)
RDW: 14.2 % (ref 11.5–15.5)
WBC: 9 10*3/uL (ref 4.0–10.5)

## 2015-03-23 LAB — URINALYSIS, ROUTINE W REFLEX MICROSCOPIC
BILIRUBIN URINE: NEGATIVE
HGB URINE DIPSTICK: NEGATIVE
LEUKOCYTES UA: NEGATIVE
NITRITE: NEGATIVE
Specific Gravity, Urine: 1.025 (ref 1.000–1.030)
URINE GLUCOSE: NEGATIVE
UROBILINOGEN UA: 0.2 (ref 0.0–1.0)
pH: 7 (ref 5.0–8.0)

## 2015-03-23 LAB — HEMOGLOBIN A1C: Hgb A1c MFr Bld: 6.2 % (ref 4.6–6.5)

## 2015-03-23 LAB — IBC PANEL
IRON: 69 ug/dL (ref 42–145)
Saturation Ratios: 14.8 % — ABNORMAL LOW (ref 20.0–50.0)
Transferrin: 332 mg/dL (ref 212.0–360.0)

## 2015-03-23 LAB — LIPID PANEL
Cholesterol: 186 mg/dL (ref 0–200)
HDL: 72.1 mg/dL (ref >39.00)
LDL Cholesterol: 98 mg/dL (ref 0–99)
NONHDL: 114.02
Total CHOL/HDL Ratio: 3
Triglycerides: 81 mg/dL (ref 0.0–149.0)
VLDL: 16.2 mg/dL (ref 0.0–40.0)

## 2015-03-23 LAB — MICROALBUMIN / CREATININE URINE RATIO
CREATININE, U: 197 mg/dL
Microalb Creat Ratio: 5.8 mg/g (ref 0.0–30.0)
Microalb, Ur: 11.4 mg/dL — ABNORMAL HIGH (ref 0.0–1.9)

## 2015-03-23 LAB — TSH: TSH: 0.97 u[IU]/mL (ref 0.35–4.50)

## 2015-03-23 LAB — FOLATE

## 2015-03-23 LAB — VITAMIN B12

## 2015-03-23 LAB — FERRITIN: FERRITIN: 9.7 ng/mL — AB (ref 10.0–291.0)

## 2015-03-23 MED ORDER — MOXIFLOXACIN HCL 400 MG PO TABS: 400.0000 mg | ORAL_TABLET | Freq: Every day | ORAL | Status: AC

## 2015-03-23 MED ORDER — UMECLIDINIUM-VILANTEROL 62.5-25 MCG/INH IN AEPB: 1.0000 | INHALATION_SPRAY | Freq: Every day | RESPIRATORY_TRACT | Status: DC

## 2015-03-23 NOTE — Progress Notes (Signed)
Pre visit review using our clinic review tool, if applicable. No additional management support is needed unless otherwise documented below in the visit note. 

## 2015-03-23 NOTE — Progress Notes (Signed)
Subjective:  Patient ID: Sheila Cisneros, female    DOB: 04/18/1940  Age: 74 y.o. MRN: [id number]  CC: Cough; Anemia; Diabetes; and Hyperlipidemia   HPI Sheila Cisneros presents for worsening cough over the last few weeks, the cough is productive of thick yellow/brown. She has COPD and bronchiectasis , she was seen by pulm a few months ago and Breo was changed to Anoro and she has noticed some improvement in her SOB and wheezing.   Outpatient Prescriptions Prior to Visit  Medication Sig Dispense Refill  . acetaminophen (TYLENOL) 500 MG tablet Take 500 mg by mouth every 6 (six) hours as needed. For pain    . Ascorbic Acid (VITAMIN C) 500 MG tablet Take 500 mg by mouth daily.      . cholecalciferol (VITAMIN D) 1000 UNITS tablet Take 1,000 Units by mouth daily.    Marland Kitchen. donepezil (ARICEPT) 5 MG tablet Take 1 tablet (5 mg total) by mouth at bedtime. 90 tablet 3  . feeding supplement, ENSURE COMPLETE, (ENSURE COMPLETE) LIQD Take 237 mLs by mouth 2 (two) times daily between meals.    . hydrochlorothiazide (MICROZIDE) 12.5 MG capsule Take 1 capsule (12.5 mg total) by mouth daily. 90 capsule 3  . IRON PO Take 1 tablet by mouth daily.    Marland Kitchen. loratadine (CLARITIN) 10 MG tablet Take 1 tablet (10 mg total) by mouth daily. 30 tablet 6  . MELATONIN PO Take by mouth.    . Multiple Vitamin (MULTIVITAMIN) capsule Take 1 capsule by mouth daily.      . potassium chloride (KLOR-CON) 20 MEQ packet Take 20 mEq by mouth 2 (two) times daily. 180 tablet 3  . simvastatin (ZOCOR) 20 MG tablet Take 1 tablet (20 mg total) by mouth at bedtime. 90 tablet 3  . venlafaxine (EFFEXOR) 37.5 MG tablet Take 37.5 mg by mouth daily as needed (hot flashes).    . verapamil (COVERA HS) 240 MG (CO) 24 hr tablet Take 1 tablet (240 mg total) by mouth at bedtime. 90 tablet 3  . Fluticasone Furoate-Vilanterol (BREO ELLIPTA) 100-25 MCG/INH AEPB Inhale 1 puff into the lungs daily. 30 each 11  . JANUMET XR [phone] MG TB24 Take 1 tablet by mouth  daily.  2   No facility-administered medications prior to visit.    ROS Review of Systems  Constitutional: Positive for chills and fatigue. Negative for fever, diaphoresis, activity change, appetite change and unexpected weight change.  HENT: Negative.  Negative for congestion, sinus pressure, sore throat and trouble swallowing.   Eyes: Negative.   Respiratory: Positive for cough, shortness of breath and wheezing. Negative for apnea, choking, chest tightness and stridor.   Cardiovascular: Negative.  Negative for chest pain, palpitations and leg swelling.  Gastrointestinal: Negative.  Negative for nausea, abdominal pain, diarrhea and constipation.  Endocrine: Negative.   Genitourinary: Negative.   Musculoskeletal: Negative.  Negative for myalgias, back pain, arthralgias and neck pain.  Skin: Negative.  Negative for color change and rash.  Allergic/Immunologic: Negative.   Neurological: Negative.   Hematological: Negative.  Negative for adenopathy. Does not bruise/bleed easily.  Psychiatric/Behavioral: Negative.     Objective:  BP 128/88 mmHg  Pulse 80  Temp(Src) 98.4 F (36.9 C) (Oral)  Resp 20  Ht 5' (1.524 m)  Wt 106 lb (48.081 kg)  BMI 20.70 kg/m2  SpO2 97%  BP Readings from Last 3 Encounters:  03/23/15 128/88  02/10/15 144/82  08/11/14 126/80    Wt Readings from Last 3 Encounters:  03/23/15 106 lb (48.081 kg)  02/10/15 102 lb 3.2 oz (46.358 kg)  08/11/14 99 lb 12.8 oz (45.269 kg)    Physical Exam  Constitutional: She is oriented to person, place, and time. She appears well-developed and well-nourished. No distress.  HENT:  Mouth/Throat: Oropharynx is clear and moist. No oropharyngeal exudate.  Eyes: Conjunctivae are normal. Right eye exhibits no discharge. Left eye exhibits no discharge. No scleral icterus.  Neck: Normal range of motion. Neck supple. No JVD present. No tracheal deviation present. No thyromegaly present.  Cardiovascular: Normal rate, regular  rhythm, normal heart sounds and intact distal pulses.  Exam reveals no gallop and no friction rub.   No murmur heard. Pulmonary/Chest: Effort normal. No accessory muscle usage or stridor. No tachypnea. No respiratory distress. She has no decreased breath sounds. She has wheezes in the right upper field and the left upper field. She has rhonchi in the right middle field and the left middle field. She has rales in the left lower field. She exhibits no tenderness.  She has good air movement  Abdominal: Soft. Bowel sounds are normal. She exhibits no distension and no mass. There is no tenderness. There is no rebound and no guarding.  Musculoskeletal: Normal range of motion. She exhibits no edema or tenderness.  Lymphadenopathy:    She has no cervical adenopathy.  Neurological: She is oriented to person, place, and time.  Skin: Skin is warm and dry. No rash noted. She is not diaphoretic. No erythema. No pallor.  Vitals reviewed.   Lab Results  Component Value Date   WBC 9.0 03/23/2015   HGB 12.4 03/23/2015   HCT 38.3 03/23/2015   PLT 292.0 03/23/2015   GLUCOSE 81 03/23/2015   CHOL 186 03/23/2015   TRIG 81.0 03/23/2015   HDL 72.10 03/23/2015   LDLCALC 98 03/23/2015   ALT 26 03/23/2015   AST 28 03/23/2015   NA 144 03/23/2015   K 4.4 03/23/2015   CL 105 03/23/2015   CREATININE 0.89 03/23/2015   BUN 21 03/23/2015   CO2 33* 03/23/2015   TSH 0.97 03/23/2015   INR 1.17 06/04/2013   HGBA1C 6.2 03/23/2015   MICROALBUR 11.4* 03/23/2015    Mm Digital Screening Bilateral  08/17/2014  CLINICAL DATA:  Screening. EXAM: DIGITAL SCREENING BILATERAL MAMMOGRAM WITH CAD COMPARISON:  Previous exam(s). ACR Breast Density Category d: The breast tissue is extremely dense, which lowers the sensitivity of mammography. FINDINGS: There are no findings suspicious for malignancy. Images were processed with CAD. IMPRESSION: No mammographic evidence of malignancy. A result letter of this screening mammogram will be  mailed directly to the patient. RECOMMENDATION: Screening mammogram in one year. (Code:SM-B-01Y) BI-RADS CATEGORY  1: Negative. Electronically Signed   By: Beckie Salts M.D.   On: 08/17/2014 15:19    Assessment & Plan:   Sheila Cisneros was seen today for cough, anemia, diabetes and hyperlipidemia.  Diagnoses and all orders for this visit:  Essential hypertension- her blood pressure is well-controlled, lites and renal function are stable -     Urinalysis, Routine w reflex microscopic (not at [facility]); Future -     Comprehensive metabolic panel; Future -     CBC with Differential/Platelet; Future  Type 2 diabetes mellitus with complication, without long-term current use of insulin (HCC)- her A1c is down to 6.2%, she was asked to discontinue taking Janumet. She has not taken it for several months and her A1c is still down to 6.2%. -     Microalbumin / creatinine urine  ratio; Future -     Hemoglobin A1c; Future -     Comprehensive metabolic panel; Future -     Ambulatory referral to Ophthalmology  Hyperlipidemia with target LDL less than 100- she has achieved her LDL goal -     Lipid panel; Future -     TSH; Future -     Comprehensive metabolic panel; Future  Cough- her chest x-ray is positive for left lower lobe pneumonia -     DG Chest 2 View; Future  Chronic obstructive pulmonary disease, unspecified COPD type (HCC)- Breo has been changed to Anoro -     Umeclidinium-Vilanterol (ANORO ELLIPTA) 62.5-25 MCG/INH AEPB; Inhale 1 puff into the lungs daily.  Chronic obstructive pulmonary disease, unspecified COPD, unspecified chronic bronchitis type -     Umeclidinium-Vilanterol (ANORO ELLIPTA) 62.5-25 MCG/INH AEPB; Inhale 1 puff into the lungs daily.  Pernicious anemia- H and H and vitamin levels are normal -     IBC panel; Future -     Ferritin; Future -     Folate; Future -     Vitamin B12; Future  Aspiration pneumonia of left lower lobe, unspecified aspiration pneumonia type (HCC)- I will  treat the infection with avelox -     moxifloxacin (AVELOX) 400 MG tablet; Take 1 tablet (400 mg total) by mouth daily.   I have discontinued Ms. Schumacher's Fluticasone Furoate-Vilanterol and JANUMET XR. I am also having her start on Umeclidinium-Vilanterol and moxifloxacin. Additionally, I am having her maintain her multivitamin, acetaminophen, vitamin C, cholecalciferol, IRON PO, venlafaxine, feeding supplement (ENSURE COMPLETE), MELATONIN PO, simvastatin, verapamil, donepezil, hydrochlorothiazide, potassium chloride, and loratadine.  Meds ordered this encounter  Medications  . Umeclidinium-Vilanterol (ANORO ELLIPTA) 62.5-25 MCG/INH AEPB    Sig: Inhale 1 puff into the lungs daily.    Dispense:  30 each    Refill:  11  . moxifloxacin (AVELOX) 400 MG tablet    Sig: Take 1 tablet (400 mg total) by mouth daily.    Dispense:  10 tablet    Refill:  0     Follow-up: Return in about 4 weeks (around 04/20/2015).  Sanda Linger, MD

## 2015-03-23 NOTE — Patient Instructions (Signed)
Chronic Obstructive Pulmonary Disease Chronic obstructive pulmonary disease (COPD) is a common lung condition in which airflow from the lungs is limited. COPD is a general term that can be used to describe many different lung problems that limit airflow, including both chronic bronchitis and emphysema. If you have COPD, your lung function will probably never return to normal, but there are measures you can take to improve lung function and make yourself feel better. CAUSES   Smoking (common).  Exposure to secondhand smoke.  Genetic problems.  Chronic inflammatory lung diseases or recurrent infections. SYMPTOMS  Shortness of breath, especially with physical activity.  Deep, persistent (chronic) cough with a large amount of thick mucus.  Wheezing.  Rapid breaths (tachypnea).  Gray or bluish discoloration (cyanosis) of the skin, especially in your fingers, toes, or lips.  Fatigue.  Weight loss.  Frequent infections or episodes when breathing symptoms become much worse (exacerbations).  Chest tightness. DIAGNOSIS Your health care provider will take a medical history and perform a physical examination to diagnose COPD. Additional tests for COPD may include:  Lung (pulmonary) function tests.  Chest X-ray.  CT scan.  Blood tests. TREATMENT  Treatment for COPD may include:  Inhaler and nebulizer medicines. These help manage the symptoms of COPD and make your breathing more comfortable.  Supplemental oxygen. Supplemental oxygen is only helpful if you have a low oxygen level in your blood.  Exercise and physical activity. These are beneficial for nearly all people with COPD.  Lung surgery or transplant.  Nutrition therapy to gain weight, if you are underweight.  Pulmonary rehabilitation. This may involve working with a team of health care providers and specialists, such as respiratory, occupational, and physical therapists. HOME CARE INSTRUCTIONS  Take all medicines  (inhaled or pills) as directed by your health care provider.  Avoid over-the-counter medicines or cough syrups that dry up your airway (such as antihistamines) and slow down the elimination of secretions unless instructed otherwise by your health care provider.  If you are a smoker, the most important thing that you can do is stop smoking. Continuing to smoke will cause further lung damage and breathing trouble. Ask your health care provider for help with quitting smoking. He or she can direct you to community resources or hospitals that provide support.  Avoid exposure to irritants such as smoke, chemicals, and fumes that aggravate your breathing.  Use oxygen therapy and pulmonary rehabilitation if directed by your health care provider. If you require home oxygen therapy, ask your health care provider whether you should purchase a pulse oximeter to measure your oxygen level at home.  Avoid contact with individuals who have a contagious illness.  Avoid extreme temperature and humidity changes.  Eat healthy foods. Eating smaller, more frequent meals and resting before meals may help you maintain your strength.  Stay active, but balance activity with periods of rest. Exercise and physical activity will help you maintain your ability to do things you want to do.  Preventing infection and hospitalization is very important when you have COPD. Make sure to receive all the vaccines your health care provider recommends, especially the pneumococcal and influenza vaccines. Ask your health care provider whether you need a pneumonia vaccine.  Learn and use relaxation techniques to manage stress.  Learn and use controlled breathing techniques as directed by your health care provider. Controlled breathing techniques include:  Pursed lip breathing. Start by breathing in (inhaling) through your nose for 1 second. Then, purse your lips as if you were   going to whistle and breathe out (exhale) through the  pursed lips for 2 seconds.  Diaphragmatic breathing. Start by putting one hand on your abdomen just above your waist. Inhale slowly through your nose. The hand on your abdomen should move out. Then purse your lips and exhale slowly. You should be able to feel the hand on your abdomen moving in as you exhale.  Learn and use controlled coughing to clear mucus from your lungs. Controlled coughing is a series of short, progressive coughs. The steps of controlled coughing are: 1. Lean your head slightly forward. 2. Breathe in deeply using diaphragmatic breathing. 3. Try to hold your breath for 3 seconds. 4. Keep your mouth slightly open while coughing twice. 5. Spit any mucus out into a tissue. 6. Rest and repeat the steps once or twice as needed. SEEK MEDICAL CARE IF:  You are coughing up more mucus than usual.  There is a change in the color or thickness of your mucus.  Your breathing is more labored than usual.  Your breathing is faster than usual. SEEK IMMEDIATE MEDICAL CARE IF:  You have shortness of breath while you are resting.  You have shortness of breath that prevents you from:  Being able to talk.  Performing your usual physical activities.  You have chest pain lasting longer than 5 minutes.  Your skin color is more cyanotic than usual.  You measure low oxygen saturations for longer than 5 minutes with a pulse oximeter. MAKE SURE YOU:  Understand these instructions.  Will watch your condition.  Will get help right away if you are not doing well or get worse.   This information is not intended to replace advice given to you by your health care provider. Make sure you discuss any questions you have with your health care provider.   Document Released: 01/10/2005 Document Revised: 04/23/2014 Document Reviewed: 11/27/2012 Elsevier Interactive Patient Education 2016 [employer]  

## 2015-03-24 ENCOUNTER — Encounter: Payer: Self-pay | Admitting: Internal Medicine

## 2015-03-27 DIAGNOSIS — J479 Bronchiectasis, uncomplicated: Secondary | ICD-10-CM | POA: Diagnosis not present

## 2015-04-06 ENCOUNTER — Encounter: Payer: Self-pay | Admitting: Internal Medicine

## 2015-04-06 ENCOUNTER — Ambulatory Visit (INDEPENDENT_AMBULATORY_CARE_PROVIDER_SITE_OTHER)
Admission: RE | Admit: 2015-04-06 | Discharge: 2015-04-06 | Disposition: A | Discharge status: Home / Self Care | Payer: Medicare Other | Source: Ambulatory Visit | Attending: Internal Medicine | Admitting: Internal Medicine

## 2015-04-06 ENCOUNTER — Ambulatory Visit (INDEPENDENT_AMBULATORY_CARE_PROVIDER_SITE_OTHER): Payer: Medicare Other | Admitting: Internal Medicine

## 2015-04-06 VITALS — BP 136/90 | HR 71 | Temp 97.6°F | Resp 16 | Ht 60.0 in | Wt 105.0 lb

## 2015-04-06 DIAGNOSIS — J984 Other disorders of lung: Secondary | ICD-10-CM | POA: Diagnosis not present

## 2015-04-06 DIAGNOSIS — R918 Other nonspecific abnormal finding of lung field: Secondary | ICD-10-CM | POA: Insufficient documentation

## 2015-04-06 DIAGNOSIS — J189 Pneumonia, unspecified organism: Secondary | ICD-10-CM

## 2015-04-06 NOTE — Patient Instructions (Signed)

## 2015-04-06 NOTE — Progress Notes (Signed)
Pre visit review using our clinic review tool, if applicable. No additional management support is needed unless otherwise documented below in the visit note. 

## 2015-04-06 NOTE — Progress Notes (Signed)
Subjective:  Patient ID: Tiajuana AmassArrie L Behnke, female    DOB: 12-Oct-1940  Age: 74 y.o. MRN: [id number]  CC: Cough   HPI Ragina Maggie SchwalbeL Lahti presents for a follow-up on cough. When she was last seen her chest x-ray showed left lower lobe pneumonia and she took a course of Avelox and started using a Anoro. She tells me that her cough is slightly better, but not much. She continues to struggle with loss of appetite and weight loss.  Outpatient Prescriptions Prior to Visit  Medication Sig Dispense Refill  . acetaminophen (TYLENOL) 500 MG tablet Take 500 mg by mouth every 6 (six) hours as needed. For pain    . Ascorbic Acid (VITAMIN C) 500 MG tablet Take 500 mg by mouth daily.      . cholecalciferol (VITAMIN D) 1000 UNITS tablet Take 1,000 Units by mouth daily.    Marland Kitchen. donepezil (ARICEPT) 5 MG tablet Take 1 tablet (5 mg total) by mouth at bedtime. 90 tablet 3  . feeding supplement, ENSURE COMPLETE, (ENSURE COMPLETE) LIQD Take 237 mLs by mouth 2 (two) times daily between meals.    . hydrochlorothiazide (MICROZIDE) 12.5 MG capsule Take 1 capsule (12.5 mg total) by mouth daily. 90 capsule 3  . IRON PO Take 1 tablet by mouth daily.    Marland Kitchen. loratadine (CLARITIN) 10 MG tablet Take 1 tablet (10 mg total) by mouth daily. 30 tablet 6  . MELATONIN PO Take by mouth.    . Multiple Vitamin (MULTIVITAMIN) capsule Take 1 capsule by mouth daily.      . potassium chloride (KLOR-CON) 20 MEQ packet Take 20 mEq by mouth 2 (two) times daily. 180 tablet 3  . simvastatin (ZOCOR) 20 MG tablet Take 1 tablet (20 mg total) by mouth at bedtime. 90 tablet 3  . Umeclidinium-Vilanterol (ANORO ELLIPTA) 62.5-25 MCG/INH AEPB Inhale 1 puff into the lungs daily. 30 each 11  . venlafaxine (EFFEXOR) 37.5 MG tablet Take 37.5 mg by mouth daily as needed (hot flashes).    . verapamil (COVERA HS) 240 MG (CO) 24 hr tablet Take 1 tablet (240 mg total) by mouth at bedtime. 90 tablet 3   No facility-administered medications prior to visit.     ROS Review of Systems  Constitutional: Positive for appetite change and fatigue. Negative for fever, chills, diaphoresis, activity change and unexpected weight change.  HENT: Negative.  Negative for congestion, sore throat and trouble swallowing.   Eyes: Negative.   Respiratory: Positive for cough, shortness of breath and wheezing. Negative for apnea, choking, chest tightness and stridor.   Cardiovascular: Negative.  Negative for chest pain, palpitations and leg swelling.  Gastrointestinal: Negative.  Negative for nausea, vomiting, abdominal pain, diarrhea, constipation and blood in stool.  Endocrine: Negative.   Genitourinary: Negative.   Musculoskeletal: Negative.  Negative for myalgias, joint swelling and arthralgias.  Skin: Negative.  Negative for color change and rash.  Allergic/Immunologic: Negative.   Neurological: Negative.  Negative for dizziness.  Hematological: Negative.  Negative for adenopathy. Does not bruise/bleed easily.  Psychiatric/Behavioral: Negative.     Objective:  BP 136/90 mmHg  Pulse 71  Temp(Src) 97.6 F (36.4 C) (Oral)  Ht 5' (1.524 m)  Wt 105 lb (47.628 kg)  BMI 20.51 kg/m2  SpO2 98%  BP Readings from Last 3 Encounters:  04/06/15 136/90  03/23/15 128/88  02/10/15 144/82    Wt Readings from Last 3 Encounters:  04/06/15 105 lb (47.628 kg)  03/23/15 106 lb (48.081 kg)  02/10/15 102 lb  3.2 oz (46.358 kg)    Physical Exam  Constitutional: She is oriented to person, place, and time.  Non-toxic appearance. She does not have a sickly appearance. She does not appear ill. No distress.  HENT:  Head: Normocephalic and atraumatic.  Mouth/Throat: Oropharynx is clear and moist. No oropharyngeal exudate.  Eyes: Conjunctivae are normal. Right eye exhibits no discharge. Left eye exhibits no discharge. No scleral icterus.  Neck: Normal range of motion. Neck supple. No JVD present. No tracheal deviation present. No thyromegaly present.  Cardiovascular:  Normal rate, regular rhythm, normal heart sounds and intact distal pulses.  Exam reveals no gallop and no friction rub.   No murmur heard. Pulmonary/Chest: Effort normal and breath sounds normal. No accessory muscle usage or stridor. No respiratory distress. She has no decreased breath sounds. She has no wheezes. She has no rales. She exhibits no tenderness.  She has mild, diffuse, bilateral expiratory rhonchi. There is good air movement  Abdominal: Soft. Bowel sounds are normal. She exhibits no distension and no mass. There is no tenderness. There is no rebound and no guarding.  Musculoskeletal: Normal range of motion. She exhibits no edema or tenderness.  Lymphadenopathy:    She has no cervical adenopathy.  Neurological: She is oriented to person, place, and time.  Skin: Skin is warm and dry. No rash noted. She is not diaphoretic. No erythema. No pallor.  Vitals reviewed.   Lab Results  Component Value Date   WBC 9.0 03/23/2015   HGB 12.4 03/23/2015   HCT 38.3 03/23/2015   PLT 292.0 03/23/2015   GLUCOSE 81 03/23/2015   CHOL 186 03/23/2015   TRIG 81.0 03/23/2015   HDL 72.10 03/23/2015   LDLCALC 98 03/23/2015   ALT 26 03/23/2015   AST 28 03/23/2015   NA 144 03/23/2015   K 4.4 03/23/2015   CL 105 03/23/2015   CREATININE 0.89 03/23/2015   BUN 21 03/23/2015   CO2 33* 03/23/2015   TSH 0.97 03/23/2015   INR 1.17 06/04/2013   HGBA1C 6.2 03/23/2015   MICROALBUR 11.4* 03/23/2015    Dg Chest 2 View  03/23/2015  CLINICAL DATA:  Congestion.  Cough and diabetes is EXAM: CHEST  2 VIEW COMPARISON:  07/14/2014 FINDINGS: Mild cardiac enlargement. Moderate hiatal hernia identified. No pleural effusion or edema. Airspace opacity within the left base is identified, suspicious for pneumonia. There is mild multi level thoracic and upper lumbar spine degenerative disc disease. IMPRESSION: 1. Left lower lobe pneumonia. 2. Hiatal hernia. Electronically Signed   By: Signa Kell M.D.   On: 03/23/2015  15:31    Assessment & Plan:   Peniel was seen today for cough.  Diagnoses and all orders for this visit:  CAP (community acquired pneumonia)- chest x-ray shows some worsening, I'm concerned that this may be a mass and not pneumonia, I've ordered a CT scan and if indeed its pneumonia then will try a different course of antibiotics. -     DG Chest 2 View; Future -     CT Chest W Contrast; Future  Mass of lower lobe of left lung- I've ordered a CT scan of the lungs to screen for carcinoma. -     CT Chest W Contrast; Future  I am having Ms. Fehringer maintain her multivitamin, acetaminophen, vitamin C, cholecalciferol, IRON PO, venlafaxine, feeding supplement (ENSURE COMPLETE), MELATONIN PO, simvastatin, verapamil, donepezil, hydrochlorothiazide, potassium chloride, loratadine, and Umeclidinium-Vilanterol.  No orders of the defined types were placed in this encounter.  Follow-up: Return in about 2 months (around 06/07/2015).  Sanda Linger, MD

## 2015-04-07 ENCOUNTER — Inpatient Hospital Stay: Admission: RE | Admit: 2015-04-07 | Payer: Medicare Other | Source: Ambulatory Visit

## 2015-04-08 ENCOUNTER — Telehealth: Payer: Self-pay

## 2015-04-08 ENCOUNTER — Telehealth: Payer: Self-pay | Admitting: Internal Medicine

## 2015-04-08 ENCOUNTER — Other Ambulatory Visit: Payer: Self-pay | Admitting: Internal Medicine

## 2015-04-08 ENCOUNTER — Ambulatory Visit (INDEPENDENT_AMBULATORY_CARE_PROVIDER_SITE_OTHER)
Admission: RE | Admit: 2015-04-08 | Discharge: 2015-04-08 | Disposition: A | Discharge status: Home / Self Care | Payer: Medicare Other | Source: Ambulatory Visit | Attending: Internal Medicine | Admitting: Internal Medicine

## 2015-04-08 DIAGNOSIS — J189 Pneumonia, unspecified organism: Secondary | ICD-10-CM

## 2015-04-08 DIAGNOSIS — J984 Other disorders of lung: Secondary | ICD-10-CM

## 2015-04-08 DIAGNOSIS — R918 Other nonspecific abnormal finding of lung field: Secondary | ICD-10-CM

## 2015-04-08 MED ORDER — IOHEXOL 300 MG/ML  SOLN
80.0000 mL | Freq: Once | INTRAMUSCULAR | Status: AC | PRN
  Administered 2015-04-08: 70 mL via INTRAVENOUS

## 2015-04-08 NOTE — Telephone Encounter (Signed)
Referral sent Please let pt know

## 2015-04-08 NOTE — Telephone Encounter (Signed)
Notes Recorded by Etta Grandchildhomas L Jones, MD on 04/08/2015 at 10:22 AM Please let her know that there is a mass over her left lower lung area - I have sent a referral to a lung specialist to consider getting a biopsy done to screen for cancer   Calling to inform of note above. LMOVM to call office back.

## 2015-04-08 NOTE — Telephone Encounter (Signed)
Misty StanleyStacey with CT called to inform that the scan was performed this morning. Pt did not make it to her appt yesterday. Misty StanleyStacey informed that the results are in epic.

## 2015-04-08 NOTE — Telephone Encounter (Signed)
Called pt home #--line rang numerous times, no VM, NA. WCB

## 2015-04-12 NOTE — Telephone Encounter (Signed)
According to 12/23 CT result note: Notes Recorded by Etta Grandchildhomas L Jones, MD on 04/08/2015 at 10:22 AM Please let her know that there is a mass over her left lower lung area - I have sent a referral to a lung specialist to consider getting a biopsy done to screen for cancer ---   Please advise where pt can be worked in at Dr. Maple HudsonYoung thanks

## 2015-04-12 NOTE — Telephone Encounter (Signed)
Attempted to contact pt. No answer. Will try back. 

## 2015-04-12 NOTE — Telephone Encounter (Signed)
Pt cb, please cb at home phone number

## 2015-04-14 NOTE — Telephone Encounter (Signed)
Per Florentina AddisonKatie pt can be seen at 10:!5 today or 9:30 AM tomorrow. ---- LMTCB x1 for pt.

## 2015-04-15 NOTE — Telephone Encounter (Signed)
Called and spoke with pt. Ov for 9:30am on 04/15/15 was not available. I scheduled pt for 04/19/15 at 9am with CY. Pt voiced understanding and had no further questions. Nothing further needed.

## 2015-04-19 ENCOUNTER — Ambulatory Visit: Payer: Medicare Other | Admitting: Internal Medicine

## 2015-04-21 ENCOUNTER — Telehealth: Payer: Self-pay | Admitting: Internal Medicine

## 2015-04-21 NOTE — Telephone Encounter (Signed)
Pt request to speak to the assistant concern about lab test that she did on 03/23/15, pt stated she never got the result of that and pt also request refill for test strip to be send to [facility]. Please help

## 2015-04-27 DIAGNOSIS — J479 Bronchiectasis, uncomplicated: Secondary | ICD-10-CM | POA: Diagnosis not present

## 2015-05-02 ENCOUNTER — Ambulatory Visit (INDEPENDENT_AMBULATORY_CARE_PROVIDER_SITE_OTHER): Payer: Medicare Other | Admitting: Internal Medicine

## 2015-05-02 ENCOUNTER — Encounter: Payer: Self-pay | Admitting: Internal Medicine

## 2015-05-02 VITALS — BP 142/92 | HR 96 | Temp 98.2°F | Resp 16 | Ht 60.0 in | Wt 110.0 lb

## 2015-05-02 DIAGNOSIS — J984 Other disorders of lung: Secondary | ICD-10-CM | POA: Diagnosis not present

## 2015-05-02 DIAGNOSIS — H9193 Unspecified hearing loss, bilateral: Secondary | ICD-10-CM | POA: Diagnosis not present

## 2015-05-02 DIAGNOSIS — H9190 Unspecified hearing loss, unspecified ear: Secondary | ICD-10-CM | POA: Insufficient documentation

## 2015-05-02 DIAGNOSIS — R918 Other nonspecific abnormal finding of lung field: Secondary | ICD-10-CM

## 2015-05-02 MED ORDER — GLUCOSE BLOOD VI STRP: ORAL_STRIP | Status: DC

## 2015-05-02 NOTE — Patient Instructions (Signed)

## 2015-05-02 NOTE — Progress Notes (Signed)
Pre visit review using our clinic review tool, if applicable. No additional management support is needed unless otherwise documented below in the visit note. 

## 2015-05-02 NOTE — Progress Notes (Signed)
Subjective:  Patient ID: Sheila Cisneros, female    DOB: 08-13-1940  Age: 75 y.o. MRN: [id number]  CC: COPD; Cough; and Hypertension   HPI Sheila Cisneros presents for a blood pressure check and follow-up on her recent abnormal  Chest x-ray. She had a CT scan that raised a concern about a mass like object over her left lower lung. She complains of a persistent cough that is productive of gray phlegm. She also complains of fatigue and chronic hearing loss. She tells me that Anoro has helped her breathing and she denies any recent episodes of wheezing or shortness of breath.  Outpatient Prescriptions Prior to Visit  Medication Sig Dispense Refill  . acetaminophen (TYLENOL) 500 MG tablet Take 500 mg by mouth every 6 (six) hours as needed. For pain    . Ascorbic Acid (VITAMIN C) 500 MG tablet Take 500 mg by mouth daily.      . cholecalciferol (VITAMIN D) 1000 UNITS tablet Take 1,000 Units by mouth daily.    Marland Kitchen. donepezil (ARICEPT) 5 MG tablet Take 1 tablet (5 mg total) by mouth at bedtime. 90 tablet 3  . feeding supplement, ENSURE COMPLETE, (ENSURE COMPLETE) LIQD Take 237 mLs by mouth 2 (two) times daily between meals.    . hydrochlorothiazide (MICROZIDE) 12.5 MG capsule Take 1 capsule (12.5 mg total) by mouth daily. 90 capsule 3  . IRON PO Take 1 tablet by mouth daily.    Marland Kitchen. loratadine (CLARITIN) 10 MG tablet Take 1 tablet (10 mg total) by mouth daily. 30 tablet 6  . MELATONIN PO Take by mouth.    . Multiple Vitamin (MULTIVITAMIN) capsule Take 1 capsule by mouth daily.      . potassium chloride (KLOR-CON) 20 MEQ packet Take 20 mEq by mouth 2 (two) times daily. 180 tablet 3  . simvastatin (ZOCOR) 20 MG tablet Take 1 tablet (20 mg total) by mouth at bedtime. 90 tablet 3  . Umeclidinium-Vilanterol (ANORO ELLIPTA) 62.5-25 MCG/INH AEPB Inhale 1 puff into the lungs daily. 30 each 11  . venlafaxine (EFFEXOR) 37.5 MG tablet Take 37.5 mg by mouth daily as needed (hot flashes).    . verapamil (COVERA HS) 240  MG (CO) 24 hr tablet Take 1 tablet (240 mg total) by mouth at bedtime. 90 tablet 3   No facility-administered medications prior to visit.    ROS Review of Systems  Constitutional: Negative.  Negative for fever, chills, diaphoresis, activity change, appetite change, fatigue and unexpected weight change.  HENT: Positive for hearing loss. Negative for congestion, ear discharge, facial swelling, sinus pressure, trouble swallowing and voice change.   Eyes: Negative.   Respiratory: Positive for cough. Negative for apnea, choking, chest tightness, shortness of breath, wheezing and stridor.   Cardiovascular: Negative.  Negative for chest pain, palpitations and leg swelling.  Gastrointestinal: Negative.  Negative for nausea, vomiting, abdominal pain, diarrhea, constipation and blood in stool.  Endocrine: Negative.   Genitourinary: Negative.   Musculoskeletal: Negative.  Negative for myalgias, back pain, joint swelling and arthralgias.  Skin: Negative.  Negative for color change and pallor.  Allergic/Immunologic: Negative.   Neurological: Negative.  Negative for dizziness, tremors, weakness and light-headedness.  Psychiatric/Behavioral: Negative.     Objective:  BP 142/92 mmHg  Pulse 96  Temp(Src) 98.2 F (36.8 C) (Oral)  Resp 16  Ht 5' (1.524 m)  Wt 110 lb (49.896 kg)  BMI 21.48 kg/m2  SpO2 93%  BP Readings from Last 3 Encounters:  05/02/15 142/92  04/06/15  136/90  03/23/15 128/88    Wt Readings from Last 3 Encounters:  05/02/15 110 lb (49.896 kg)  04/06/15 105 lb (47.628 kg)  03/23/15 106 lb (48.081 kg)    Physical Exam  Constitutional: She is oriented to person, place, and time. She appears well-developed and well-nourished.  Non-toxic appearance. She does not have a sickly appearance. She does not appear ill. No distress.  HENT:  Head: Normocephalic and atraumatic.  Right Ear: Hearing, tympanic membrane, external ear and ear canal normal.  Left Ear: Hearing, tympanic  membrane, external ear and ear canal normal.  Mouth/Throat: Oropharynx is clear and moist. No oropharyngeal exudate.  Eyes: Conjunctivae are normal. Right eye exhibits no discharge. Left eye exhibits no discharge. No scleral icterus.  Neck: Normal range of motion. Neck supple. No JVD present. No tracheal deviation present. No thyromegaly present.  Cardiovascular: Normal rate, regular rhythm, normal heart sounds and intact distal pulses.  Exam reveals no gallop and no friction rub.   No murmur heard. Pulmonary/Chest: Effort normal and breath sounds normal. No stridor. No respiratory distress. She has no wheezes. She has no rales. She exhibits no tenderness.  Abdominal: Soft. Bowel sounds are normal. She exhibits no distension and no mass. There is no tenderness. There is no rebound and no guarding.  Musculoskeletal: Normal range of motion. She exhibits no edema or tenderness.  Lymphadenopathy:    She has no cervical adenopathy.  Neurological: She is oriented to person, place, and time.  Skin: Skin is warm and dry. No rash noted. She is not diaphoretic. No erythema. No pallor.  Psychiatric: She has a normal mood and affect. Her behavior is normal. Judgment and thought content normal.  Vitals reviewed.   Lab Results  Component Value Date   WBC 9.0 03/23/2015   HGB 12.4 03/23/2015   HCT 38.3 03/23/2015   PLT 292.0 03/23/2015   GLUCOSE 81 03/23/2015   CHOL 186 03/23/2015   TRIG 81.0 03/23/2015   HDL 72.10 03/23/2015   LDLCALC 98 03/23/2015   ALT 26 03/23/2015   AST 28 03/23/2015   NA 144 03/23/2015   K 4.4 03/23/2015   CL 105 03/23/2015   CREATININE 0.89 03/23/2015   BUN 21 03/23/2015   CO2 33* 03/23/2015   TSH 0.97 03/23/2015   INR 1.17 06/04/2013   HGBA1C 6.2 03/23/2015   MICROALBUR 11.4* 03/23/2015    Ct Chest W Contrast  04/08/2015  CLINICAL DATA:  75 year old female with recent diagnosis of pneumonia. Antibiotics are completed. Evaluate for left lower lobe mass. EXAM: CT  CHEST WITH CONTRAST TECHNIQUE: Multidetector CT imaging of the chest was performed during intravenous contrast administration. CONTRAST:  70mL OMNIPAQUE IOHEXOL 300 MG/ML  SOLN COMPARISON:  Prior chest x-ray 04/06/2015 ; prior CT scan of the abdomen and pelvis 06/04/2013 and 10/08/2011 ; prior CT scan of the chest 08/28/2010 FINDINGS: Mediastinum: Unremarkable CT appearance of the thyroid gland. No suspicious mediastinal or hilar adenopathy. No soft tissue mediastinal mass. Moderate sliding hiatal hernia. Heart/Vascular: Conventional 3 vessel aortic arch anatomy. The great vessels are tortuous. Although the aortic root and ascending aorta are ectatic with maximal diameters of 3.4 and 3.2 cm respectively, there is no aneurysm or evidence of dissection. The heart remains within normal limits for size. Trace atherosclerotic calcification along the course the left anterior descending coronary artery. No pericardial effusion. Lungs/Pleura: Mild bronchiectasis in the left lower lobe with associated atelectasis versus scarring. Additionally, there is a rounded opacity measuring 3.3 x 1.9 cm at the  superior margin of this region of atelectasis/ scarring. Additional patchy regions of ground-glass attenuation airspace opacity in a peribronchovascular distribution are present in the superior segment of the left lower lobe, the inferior aspect of the left upper lobe and the right lower lobe. Additionally, there are scattered small pulmonary nodules the largest of which measures 5 mm in the right upper lobe (image 22 series 4) which remain unchanged dating back to May of 2012. Of note, on prior imaging there has been a similar pattern of ground-glass attenuation airspace opacities intermittently. Additionally, there has been evidence of chronic atelectasis and early scarring in the left lower lobe with accompanying mild bronchiectasis as far back as May of 2012. Small left pleural effusion. Bones/Soft Tissues: No acute fracture  or aggressive appearing lytic or blastic osseous lesion. Multilevel degenerative disc disease. Incompletely imaged dextro convex scoliosis at the thoracolumbar junction. Upper Abdomen: Stable small hypoechoic cyst in the right hepatic lobe May of 2012. Enhancing, almost tubular soft tissue mass in the left upper quadrant adjacent to the pancreatic tail measures 2.4 x 1.7 cm which remains unchanged compared to 08/28/2010 IMPRESSION: 1. Multifocal regions of patchy ground-glass attenuation in a peribronchovascular distribution involving the right lower, left lower and inferior left upper lungs. Additionally, there is persistent volume loss, bronchiectasis and a rounded area of masslike consolidation in the inferior left lower lobe. Interestingly, similar findings (save for the masslike consolidation) have been seen intermittently on prior imaging studies from 2015, 2013 and 2012. Overall, these findings are most consistent with a recurrent infectious/inflammatory process. Given the dependent distribution and the presence of the moderately large hiatal hernia, intermittent aspiration is a strong consideration. However, it is difficult to exclude a superimposed malignancy in the region of masslike consolidation in the left lower lobe which has not been seen in the same configuration previously. Consider pulmonology consultation for further evaluation. This region may be amenable to further evaluation of via bronchoscopy. 2. Coronary artery calcification. 3. Ectatic aortic root and ascending thoracic aorta without aneurysm. 4. Small left pleural effusion. 5. Multilevel degenerative disc disease with dextro convex scoliosis. 6. Stable enhancing soft tissue structure adjacent to the pancreatic tail dating back to 08/28/2010. Greater than 4 year stability is highly suggestive of a benign process. These results will be called to the ordering clinician or representative by the Radiologist Assistant, and communication  documented in the PACS or zVision Dashboard. Electronically Signed   By: Malachy Moan M.D.   On: 04/08/2015 09:55    Assessment & Plan:   Shayna was seen today for copd, cough and hypertension.  Diagnoses and all orders for this visit:  Mass of lower lobe of left lung- I've asked her to see pulmonary to consider having this area biopsied. -     Ambulatory referral to Pulmonology  Hearing loss, bilateral -     Ambulatory referral to Audiology  Other orders -     glucose blood test strip; Use to check blood sugar once daily.   I am having Ms. Piontek start on glucose blood. I am also having her maintain her multivitamin, acetaminophen, vitamin C, cholecalciferol, IRON PO, venlafaxine, feeding supplement (ENSURE COMPLETE), MELATONIN PO, simvastatin, verapamil, donepezil, hydrochlorothiazide, potassium chloride, loratadine, and Umeclidinium-Vilanterol.  Meds ordered this encounter  Medications  . glucose blood test strip    Sig: Use to check blood sugar once daily.    Dispense:  100 each    Refill:  4     Follow-up: Return in about 3  months (around 07/31/2015).  Sanda Linger, MD

## 2015-05-03 ENCOUNTER — Other Ambulatory Visit: Payer: Self-pay | Admitting: Internal Medicine

## 2015-05-03 DIAGNOSIS — F0390 Unspecified dementia without behavioral disturbance: Secondary | ICD-10-CM

## 2015-05-03 MED ORDER — NAMZARIC 7 & 14 & 21 &28 -10 MG PO C4PK: 1.0000 | EXTENDED_RELEASE_CAPSULE | Freq: Every day | ORAL | Status: DC

## 2015-05-09 ENCOUNTER — Other Ambulatory Visit: Payer: Self-pay | Admitting: Internal Medicine

## 2015-05-19 DIAGNOSIS — H40031 Anatomical narrow angle, right eye: Secondary | ICD-10-CM | POA: Diagnosis not present

## 2015-05-19 DIAGNOSIS — H40032 Anatomical narrow angle, left eye: Secondary | ICD-10-CM | POA: Diagnosis not present

## 2015-05-19 DIAGNOSIS — H2513 Age-related nuclear cataract, bilateral: Secondary | ICD-10-CM | POA: Diagnosis not present

## 2015-06-08 DIAGNOSIS — H40033 Anatomical narrow angle, bilateral: Secondary | ICD-10-CM | POA: Diagnosis not present

## 2015-06-08 DIAGNOSIS — H40032 Anatomical narrow angle, left eye: Secondary | ICD-10-CM | POA: Diagnosis not present

## 2015-06-09 ENCOUNTER — Encounter: Payer: Self-pay | Admitting: Internal Medicine

## 2015-06-09 ENCOUNTER — Ambulatory Visit (INDEPENDENT_AMBULATORY_CARE_PROVIDER_SITE_OTHER): Payer: Medicare Other | Admitting: Internal Medicine

## 2015-06-09 VITALS — BP 120/86 | HR 99 | Temp 98.7°F | Resp 16 | Ht 60.0 in | Wt 111.0 lb

## 2015-06-09 DIAGNOSIS — F409 Phobic anxiety disorder, unspecified: Secondary | ICD-10-CM | POA: Diagnosis not present

## 2015-06-09 DIAGNOSIS — J984 Other disorders of lung: Secondary | ICD-10-CM | POA: Diagnosis not present

## 2015-06-09 DIAGNOSIS — H6121 Impacted cerumen, right ear: Secondary | ICD-10-CM | POA: Diagnosis not present

## 2015-06-09 DIAGNOSIS — F5105 Insomnia due to other mental disorder: Secondary | ICD-10-CM | POA: Diagnosis not present

## 2015-06-09 DIAGNOSIS — R918 Other nonspecific abnormal finding of lung field: Secondary | ICD-10-CM

## 2015-06-09 MED ORDER — TRAZODONE HCL 50 MG PO TABS: 50.0000 mg | ORAL_TABLET | Freq: Every day | ORAL | Status: DC

## 2015-06-09 NOTE — Patient Instructions (Signed)
Insomnia Insomnia is a sleep disorder that makes it difficult to fall asleep or to stay asleep. Insomnia can cause tiredness (fatigue), low energy, difficulty concentrating, mood swings, and poor performance at work or school.  There are three different ways to classify insomnia:  Difficulty falling asleep.  Difficulty staying asleep.  Waking up too early in the morning. Any type of insomnia can be long-term (chronic) or short-term (acute). Both are common. Short-term insomnia usually lasts for three months or less. Chronic insomnia occurs at least three times a week for longer than three months. CAUSES  Insomnia may be caused by another condition, situation, or substance, such as:  Anxiety.  Certain medicines.  Gastroesophageal reflux disease (GERD) or other gastrointestinal conditions.  Asthma or other breathing conditions.  Restless legs syndrome, sleep apnea, or other sleep disorders.  Chronic pain.  Menopause. This may include hot flashes.  Stroke.  Abuse of alcohol, tobacco, or illegal drugs.  Depression.  Caffeine.   Neurological disorders, such as Alzheimer disease.  An overactive thyroid (hyperthyroidism). The cause of insomnia may not be known. RISK FACTORS Risk factors for insomnia include:  Gender. Women are more commonly affected than men.  Age. Insomnia is more common as you get older.  Stress. This may involve your professional or personal life.  Income. Insomnia is more common in people with lower income.  Lack of exercise.   Irregular work schedule or night shifts.  Traveling between different time zones. SIGNS AND SYMPTOMS If you have insomnia, trouble falling asleep or trouble staying asleep is the main symptom. This may lead to other symptoms, such as:  Feeling fatigued.  Feeling nervous about going to sleep.  Not feeling rested in the morning.  Having trouble concentrating.  Feeling irritable, anxious, or depressed. TREATMENT   Treatment for insomnia depends on the cause. If your insomnia is caused by an underlying condition, treatment will focus on addressing the condition. Treatment may also include:   Medicines to help you sleep.  Counseling or therapy.  Lifestyle adjustments. HOME CARE INSTRUCTIONS   Take medicines only as directed by your health care provider.  Keep regular sleeping and waking hours. Avoid naps.  Keep a sleep diary to help you and your health care provider figure out what could be causing your insomnia. Include:   When you sleep.  When you wake up during the night.  How well you sleep.   How rested you feel the next day.  Any side effects of medicines you are taking.  What you eat and drink.   Make your bedroom a comfortable place where it is easy to fall asleep:  Put up shades or special blackout curtains to block light from outside.  Use a white noise machine to block noise.  Keep the temperature cool.   Exercise regularly as directed by your health care provider. Avoid exercising right before bedtime.  Use relaxation techniques to manage stress. Ask your health care provider to suggest some techniques that may work well for you. These may include:  Breathing exercises.  Routines to release muscle tension.  Visualizing peaceful scenes.  Cut back on alcohol, caffeinated beverages, and cigarettes, especially close to bedtime. These can disrupt your sleep.  Do not overeat or eat spicy foods right before bedtime. This can lead to digestive discomfort that can make it hard for you to sleep.  Limit screen use before bedtime. This includes:  Watching TV.  Using your smartphone, tablet, and computer.  Stick to a routine. This   can help you fall asleep faster. Try to do a quiet activity, brush your teeth, and go to bed at the same time each night.  Get out of bed if you are still awake after 15 minutes of trying to sleep. Keep the lights down, but try reading or  doing a quiet activity. When you feel sleepy, go back to bed.  Make sure that you drive carefully. Avoid driving if you feel very sleepy.  Keep all follow-up appointments as directed by your health care provider. This is important. SEEK MEDICAL CARE IF:   You are tired throughout the day or have trouble in your daily routine due to sleepiness.  You continue to have sleep problems or your sleep problems get worse. SEEK IMMEDIATE MEDICAL CARE IF:   You have serious thoughts about hurting yourself or someone else.   This information is not intended to replace advice given to you by your health care provider. Make sure you discuss any questions you have with your health care provider.   Document Released: 03/30/2000 Document Revised: 12/22/2014 Document Reviewed: 01/01/2014 [employer] Interactive Patient Education 2016 [employer]  

## 2015-06-09 NOTE — Progress Notes (Signed)
Pre visit review using our clinic review tool, if applicable. No additional management support is needed unless otherwise documented below in the visit note. 

## 2015-06-09 NOTE — Progress Notes (Signed)
Subjective:  Patient ID: Sheila Cisneros, female    DOB: 19-Jan-1941  Age: 75 y.o. MRN: [id number]  CC: Cough   HPI Sheila Cisneros presents for a follow-up on cough and abnormal chest x-ray. The last time I saw her about a month ago she was referred to pulmonary for evaluation of a suspicious pulmonary mass. She states that she has not seen the lung doctors yet. She complains of a mild, persistent, mostly nonproductive cough.  She also complains of discomfort in her right ear and a decreased level of hearing. She denies dizziness or vertigo.  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  Outpatient Prescriptions Prior to Visit  Medication Sig Dispense Refill  . acetaminophen (TYLENOL) 500 MG tablet Take 500 mg by mouth every 6 (six) hours as needed. For pain    . Ascorbic Acid (VITAMIN C) 500 MG tablet Take 500 mg by mouth daily.      . cholecalciferol (VITAMIN D) 1000 UNITS tablet Take 1,000 Units by mouth daily.    . feeding supplement, ENSURE COMPLETE, (ENSURE COMPLETE) LIQD Take 237 mLs by mouth 2 (two) times daily between meals.    Marland Kitchen glucose blood test strip Use to check blood sugar once daily. 100 each 4  . hydrochlorothiazide (MICROZIDE) 12.5 MG capsule Take 1 capsule (12.5 mg total) by mouth daily. 90 capsule 3  . IRON PO Take 1 tablet by mouth daily.    Marland Kitchen loratadine (CLARITIN) 10 MG tablet Take 1 tablet (10 mg total) by mouth daily. 30 tablet 6  . MELATONIN PO Take by mouth.    . Multiple Vitamin (MULTIVITAMIN) capsule Take 1 capsule by mouth daily.      Marland Kitchen NAMZARIC 7 & 14 & 21 &28 -10 MG C4PK Take 1 tablet by mouth daily. 28 each 0  . potassium chloride (KLOR-CON) 20 MEQ packet Take 20 mEq by mouth 2 (two) times daily. 180 tablet 3  . simvastatin (ZOCOR) 20 MG tablet Take 1 tablet (20 mg total) by mouth at bedtime. 90 tablet 3  . Umeclidinium-Vilanterol (ANORO ELLIPTA) 62.5-25 MCG/INH AEPB Inhale 1 puff into the lungs daily. 30 each 11  .  venlafaxine (EFFEXOR) 37.5 MG tablet Take 37.5 mg by mouth daily as needed (hot flashes).    . verapamil (COVERA HS) 240 MG (CO) 24 hr tablet Take 1 tablet (240 mg total) by mouth at bedtime. 90 tablet 3  . hydrochlorothiazide (MICROZIDE) 12.5 MG capsule TAKE 1 CAPSULE (12.5 MG TOTAL) BY MOUTH DAILY. (Patient not taking: Reported on 06/13/2015) 90 capsule 1   No facility-administered medications prior to visit.    ROS Review of Systems  Constitutional: Positive for activity change and unexpected weight change. Negative for fever, chills, diaphoresis, appetite change and fatigue.       She complains of poor appetite and continued weight loss  HENT: Positive for ear pain. Negative for congestion, postnasal drip, sinus pressure and trouble swallowing.   Eyes: Negative.   Respiratory: Positive for cough. Negative for choking, chest tightness, shortness of breath, wheezing and stridor.   Cardiovascular: Negative.  Negative for chest pain, palpitations and leg swelling.  Gastrointestinal: Negative.  Negative for nausea, vomiting, abdominal pain, diarrhea, constipation and blood in stool.  Endocrine: Negative.   Genitourinary: Negative.   Musculoskeletal: Negative.  Negative for myalgias, back pain, joint swelling and arthralgias.  Skin: Negative.  Negative for color change and rash.  Allergic/Immunologic: Negative.   Neurological: Negative.   Hematological:  Negative.  Negative for adenopathy. Does not bruise/bleed easily.  Psychiatric/Behavioral: Positive for sleep disturbance. The patient is nervous/anxious.     Objective:  BP 120/86 mmHg  Pulse 99  Temp(Src) 98.7 F (37.1 C) (Oral)  Resp 16  Ht 5' (1.524 m)  Wt 111 lb (50.349 kg)  BMI 21.68 kg/m2  SpO2 95%  BP Readings from Last 3 Encounters:  06/13/15 128/80  06/09/15 120/86  05/02/15 142/92    Wt Readings from Last 3 Encounters:  06/13/15 106 lb 9.6 oz (48.353 kg)  06/09/15 111 lb (50.349 kg)  05/02/15 110 lb (49.896 kg)     Physical Exam  Constitutional: She is oriented to person, place, and time. She appears well-developed and well-nourished.  Non-toxic appearance. She does not have a sickly appearance. She does not appear ill. No distress.  HENT:  Right Ear: Hearing, tympanic membrane and external ear normal. A foreign body (cerumen impaction) is present.  Left Ear: Hearing, tympanic membrane, external ear and ear canal normal.  Mouth/Throat: Oropharynx is clear and moist. No oropharyngeal exudate.  I put Colace in her right ear and then irrigated it with water and used an ear pick to remove the cerumen.  After that I reexamined the ear and found that the external auditory canal was intact with no injury, there was no swelling or exudate. Her right tympanic membrane is normal.  Eyes: Conjunctivae are normal. Right eye exhibits no discharge. Left eye exhibits no discharge. No scleral icterus.  Neck: Normal range of motion. Neck supple. No JVD present. No tracheal deviation present. No thyromegaly present.  Cardiovascular: Normal rate, regular rhythm, normal heart sounds and intact distal pulses.  Exam reveals no gallop and no friction rub.   No murmur heard. Pulmonary/Chest: Effort normal and breath sounds normal. No stridor. No respiratory distress. She has no wheezes. She has no rales. She exhibits no tenderness.  Abdominal: Soft. Bowel sounds are normal. She exhibits no distension and no mass. There is no tenderness. There is no rebound and no guarding.  Musculoskeletal: Normal range of motion. She exhibits no edema or tenderness.  Lymphadenopathy:    She has no cervical adenopathy.  Neurological: She is oriented to person, place, and time.  Skin: Skin is warm and dry. No rash noted. She is not diaphoretic. No erythema. No pallor.  Psychiatric: She has a normal mood and affect. Her behavior is normal. Judgment and thought content normal.  Vitals reviewed.   Lab Results  Component Value Date   WBC 9.0  03/23/2015   HGB 12.4 03/23/2015   HCT 38.3 03/23/2015   PLT 292.0 03/23/2015   GLUCOSE 81 03/23/2015   CHOL 186 03/23/2015   TRIG 81.0 03/23/2015   HDL 72.10 03/23/2015   LDLCALC 98 03/23/2015   ALT 26 03/23/2015   AST 28 03/23/2015   NA 144 03/23/2015   K 4.4 03/23/2015   CL 105 03/23/2015   CREATININE 0.89 03/23/2015   BUN 21 03/23/2015   CO2 33* 03/23/2015   TSH 0.97 03/23/2015   INR 1.17 06/04/2013   HGBA1C 6.2 03/23/2015   MICROALBUR 11.4* 03/23/2015    Ct Chest W Contrast  04/08/2015  CLINICAL DATA:  74 year old female with recent diagnosis of pneumonia. Antibiotics are completed. Evaluate for left lower lobe mass. EXAM: CT CHEST WITH CONTRAST TECHNIQUE: Multidetector CT imaging of the chest was performed during intravenous contrast administration. CONTRAST:  70mL OMNIPAQUE IOHEXOL 300 MG/ML  SOLN COMPARISON:  Prior chest x-ray 04/06/2015 ; prior CT scan  of the abdomen and pelvis 06/04/2013 and 10/08/2011 ; prior CT scan of the chest 08/28/2010 FINDINGS: Mediastinum: Unremarkable CT appearance of the thyroid gland. No suspicious mediastinal or hilar adenopathy. No soft tissue mediastinal mass. Moderate sliding hiatal hernia. Heart/Vascular: Conventional 3 vessel aortic arch anatomy. The great vessels are tortuous. Although the aortic root and ascending aorta are ectatic with maximal diameters of 3.4 and 3.2 cm respectively, there is no aneurysm or evidence of dissection. The heart remains within normal limits for size. Trace atherosclerotic calcification along the course the left anterior descending coronary artery. No pericardial effusion. Lungs/Pleura: Mild bronchiectasis in the left lower lobe with associated atelectasis versus scarring. Additionally, there is a rounded opacity measuring 3.3 x 1.9 cm at the superior margin of this region of atelectasis/ scarring. Additional patchy regions of ground-glass attenuation airspace opacity in a peribronchovascular distribution are present  in the superior segment of the left lower lobe, the inferior aspect of the left upper lobe and the right lower lobe. Additionally, there are scattered small pulmonary nodules the largest of which measures 5 mm in the right upper lobe (image 22 series 4) which remain unchanged dating back to May of 2012. Of note, on prior imaging there has been a similar pattern of ground-glass attenuation airspace opacities intermittently. Additionally, there has been evidence of chronic atelectasis and early scarring in the left lower lobe with accompanying mild bronchiectasis as far back as May of 2012. Small left pleural effusion. Bones/Soft Tissues: No acute fracture or aggressive appearing lytic or blastic osseous lesion. Multilevel degenerative disc disease. Incompletely imaged dextro convex scoliosis at the thoracolumbar junction. Upper Abdomen: Stable small hypoechoic cyst in the right hepatic lobe May of 2012. Enhancing, almost tubular soft tissue mass in the left upper quadrant adjacent to the pancreatic tail measures 2.4 x 1.7 cm which remains unchanged compared to 08/28/2010 IMPRESSION: 1. Multifocal regions of patchy ground-glass attenuation in a peribronchovascular distribution involving the right lower, left lower and inferior left upper lungs. Additionally, there is persistent volume loss, bronchiectasis and a rounded area of masslike consolidation in the inferior left lower lobe. Interestingly, similar findings (save for the masslike consolidation) have been seen intermittently on prior imaging studies from 2015, 2013 and 2012. Overall, these findings are most consistent with a recurrent infectious/inflammatory process. Given the dependent distribution and the presence of the moderately large hiatal hernia, intermittent aspiration is a strong consideration. However, it is difficult to exclude a superimposed malignancy in the region of masslike consolidation in the left lower lobe which has not been seen in the same  configuration previously. Consider pulmonology consultation for further evaluation. This region may be amenable to further evaluation of via bronchoscopy. 2. Coronary artery calcification. 3. Ectatic aortic root and ascending thoracic aorta without aneurysm. 4. Small left pleural effusion. 5. Multilevel degenerative disc disease with dextro convex scoliosis. 6. Stable enhancing soft tissue structure adjacent to the pancreatic tail dating back to 08/28/2010. Greater than 4 year stability is highly suggestive of a benign process. These results will be called to the ordering clinician or representative by the Radiologist Assistant, and communication documented in the PACS or zVision Dashboard. Electronically Signed   By: Malachy Moan M.D.   On: 04/08/2015 09:55    Assessment & Plan:   Sheila Cisneros was seen today for cough.  Diagnoses and all orders for this visit:  Mass of lower lobe of left lung- I think this lesion needs to be considered for biopsy, I referred her to pulmonary again -  Ambulatory referral to Pulmonology  Insomnia due to anxiety and fear- will start trazodone to treat the insomnia -     traZODone (DESYREL) 50 MG tablet; Take 1 tablet (50 mg total) by mouth at bedtime.  Cerumen impaction, right- status post successful removal of impacted cerumen   I am having Sheila Cisneros start on traZODone. I am also having her maintain her multivitamin, acetaminophen, vitamin C, cholecalciferol, IRON PO, venlafaxine, feeding supplement (ENSURE COMPLETE), MELATONIN PO, simvastatin, verapamil, hydrochlorothiazide, potassium chloride, loratadine, umeclidinium-vilanterol, glucose blood, and NAMZARIC.  Meds ordered this encounter  Medications  . traZODone (DESYREL) 50 MG tablet    Sig: Take 1 tablet (50 mg total) by mouth at bedtime.    Dispense:  90 tablet    Refill:  3     Follow-up: Return in about 6 months (around 12/07/2015).  Sanda Linger, MD

## 2015-06-10 ENCOUNTER — Telehealth: Payer: Self-pay | Admitting: Internal Medicine

## 2015-06-10 NOTE — Telephone Encounter (Signed)
Spoke with patient-she is aware that she had NS's for appt on 04-19-15 with CY to follow up for lung mass per Dr. Yetta Barre. I have her scheduled to see CY on Monday 06/13/15 at 9:45am with CXR prior. Nothing more needed at this time.

## 2015-06-13 ENCOUNTER — Encounter: Payer: Self-pay | Admitting: Internal Medicine

## 2015-06-13 ENCOUNTER — Ambulatory Visit (INDEPENDENT_AMBULATORY_CARE_PROVIDER_SITE_OTHER): Payer: Medicare Other | Admitting: Internal Medicine

## 2015-06-13 ENCOUNTER — Ambulatory Visit (INDEPENDENT_AMBULATORY_CARE_PROVIDER_SITE_OTHER)
Admission: RE | Admit: 2015-06-13 | Discharge: 2015-06-13 | Disposition: A | Discharge status: Home / Self Care | Payer: Medicare Other | Source: Ambulatory Visit | Attending: Internal Medicine | Admitting: Internal Medicine

## 2015-06-13 VITALS — BP 128/80 | HR 75 | Ht 59.0 in | Wt 106.6 lb

## 2015-06-13 DIAGNOSIS — J470 Bronchiectasis with acute lower respiratory infection: Secondary | ICD-10-CM | POA: Diagnosis not present

## 2015-06-13 DIAGNOSIS — H6120 Impacted cerumen, unspecified ear: Secondary | ICD-10-CM | POA: Insufficient documentation

## 2015-06-13 DIAGNOSIS — J984 Other disorders of lung: Secondary | ICD-10-CM

## 2015-06-13 DIAGNOSIS — R918 Other nonspecific abnormal finding of lung field: Secondary | ICD-10-CM

## 2015-06-13 DIAGNOSIS — R05 Cough: Secondary | ICD-10-CM | POA: Diagnosis not present

## 2015-06-13 MED ORDER — DOXYCYCLINE HYCLATE 100 MG PO TABS: 100.0000 mg | ORAL_TABLET | Freq: Two times a day (BID) | ORAL | Status: DC

## 2015-06-13 NOTE — Patient Instructions (Signed)
Script sent for doxycycline antibiotic  I think what we are seeing on your xrays is chronic pneumonia and inflammation in the left lung from aspiration.  I will consider whether bronchoscopy would be helpful, to use a light to look down in that lung.  Please keep using your pneumatic vest to help vibrate your lungs clear.

## 2015-06-13 NOTE — Progress Notes (Signed)
Patient ID: Sheila Cisneros, female    DOB: [birth year], 75 y.o.   MRN: [id number] HPI  07/31/10- 70 yoF followed for chronic bronchitis/ bronchiectasis and hx rhinosinusitis. Never smoker. Last here- January 30, 2010. She remained congested throough the winter, but she put up with it. No events needing medical visits. Pollen now makes cough somewhat more constant. Rarely productive. Denies fever or night sweats. Nebulizer still helps, used about twice daily. Denies  Chest pain or headache.   09/05/10- Chronic bronchitis, bronchiectasis, hx rhinosinusitis Reports increased chest congestion,producitve cough dark yellow.  Denies fever or sweat.She apparently didn't actually get a script for Daliresp last visit. We discussed side effects again. She is trying to exercise- water aerobics and walking.  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  Acute visit. Cough worse in past week. Was put on prednisone 2 weeks ago for hip- didn't help chest-  pred ended yesterday. Occasional hot flash but no sustained fever. Continues Daliresp.  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  She notices a little routine cough but it has been more productive with some darkening sputum and thicker mucus in the last week. No fever, sore throat or sweats. Sinuses feel normal. We had previously tried [employer]- she remembers it kept her awake at night. We talked about trying it again if necessary to suppress her repeated bronchitis.  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  She comes for an acute visit. She has been around a lot of family recently. 2 days ago she thinks she went outdoors too quickly after her bath and may have gotten chills. Next on waking, she noted sore throat no fever no stomach upset. No  sinus congestion but sniffing a lot. Mainly complains of chest congestion and rattle cough productive of thick yellow sputum which has been sometimes brown. She started herself on doxycycline. We discussed her earlier experience with Daliresp. She never took it, although she bought it, because she disliked the printed side effect list. I went over those and my own experience with this drug again today. Has had flu shot this year.  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  Has had flu vaccine. After last visit she tried Daliresp but found it too expensive. She keeps her persistent deep chest rattle and coughs scant gray sputum. Does not really feel badly and is able to exercise. Does sometimes cough until she retches. Does not recognize reflux or sinus pain/drainage. She is using her Flutter device twice daily for pulmonary toilet.  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  CT 08/25/10-  1. Bilateral cylindrical type atelectasis within the lower lobes.  2. Bilateral lower lobe airspace densities, likely the sequela of  inflammation or infection. Suggest short-term follow-up  examination to ensure resolution.  3. Scattered small nodules within the right upper lobe are also  likely related to inflammation or infection. A short-term follow-  up examination in 3 months is recommended to ensure resolution.  Original Report Authenticated By: Rosealee AlbeeAYLOR H. STROUD, M.D.  Had flu vax. Mucomyst is not available - prescribed last visit.  Felt well after last here until took chill first of this week. Sore throat, increased productive cough and congestion. Head stuffy, no HA. Sputum cx  02/26/11- grew E. Coli. Septra was called in and helped, after refill she has now been off x 1 week.  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  At last office visit she stopped Daliresp as ineffective. We had given Ceftin for Escherichia coli on sputum culture. She admits cough has been better but remains productive of yellow sputum off and on. Denies chest pain, fever, chills, enlarged nodes.  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 Recently had flare up last week due to pollen but has gotten better now. She noted mostly nasal stuffiness. Not much cough or wheeze and no recent need for antibiotics.  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  Patient states that cough has been "off and on", depends on weather. pt c/o head congestion today Her chronic cough is very sensitive to weather changes. She asked me to evaluate a bulge on her back, demonstrating scoliosis with protrusion of right posterior rib cage. Relatively little productive cough recently and no fever or blood. CXR 02/04/12-reviewed IMPRESSION:  Stable basilar scarring. Stable cardiomegaly. No active lung  disease.  Original Report Authenticated By: Juline PatchPAUL D. BARRY, M.D.   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 FOLLOWS FOR:  States breathing "comes and goes."  Congestion this am with gray mucus and wheezing.denies having cold or acute infection. Low back pain, pending injection.  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 Follow up.  C/o back pain and has taken pain med for this.  Prod cough with dark mucus since yesterday.  No f/c/s, wheezing, chest tightness, or chest pain.   CXR 11/09/12 IMPRESSION:  Stable chronic basilar lung disease. No acute cardiopulmonary  process.  Original Report Authenticated By: Carey BullocksWilliam Veazey,  M.D.  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 Sister-in-law is here FOLLOWS FOR:  In hospital 8/26 - 8/31 with altered mental status attributed to pain meds.  Still complaining of swelling in both feet/ankles, SOB and cough w/grayish mucus Back pain remains a problem. Brain MRI positive for air-fluid levels in sinuses/ acute sinusitis. She denies facial pain or headache now after treatment with Cleocin and Biaxin. CXR 1 V  8/  /14 IMPRESSION:  1. Streaky bibasilar atelectasis and low lung volumes.  2. 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  Original Report Authenticated By: Britta MccreedySusan Turner, M.D.  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 Sister-in-law is here FOLLOWS FOR: continues to cough-productive at times-clear to brown in color. She reports fainting after getting out of a sauna yesterday. Sounds postural after heat. Ok now.  CXR 06/30/13 IMPRESSION:  No acute cardiopulmonary disease.  Electronically Signed  By: Amie Portlandavid Ormond M.D.  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 Most of her attention is really on weakness and chronic back pain. Using nebulizer 2 or 3 times a day. She had been  hospitalized earlier this year for GI bleed.  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 Has Flutter but not using. Had flu vax. Occasional nasal stuffy. Limited by chronic back pain/ scoliosis.   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  c/o intermittent chest tightness, prod cough with yellow/brown thick mucus worse when laying down.  Pt stopped albuterol HFA d/t shaking, but likes Breo.  Took antibiotic from Dr. Yetta Barre at her last office visit in late March. Like all antibiotics it has helped her briefly but then the productive cough starts again. She has tried to be diligent with a Flutter device for pulmonary toilet and with various chest physiotherapy techniques we have taught her. She has been unable to improve her chronic productive cough and chest rattle associated with her known chronic bronchiectasis known from CT scans as far back as 2012. CXR 07/14/14 IMPRESSION: Patchy infiltrate posterior left base. Mild scarring lateral right base. Sizable hiatal hernia. These results will be called to the ordering clinician or representative by the Radiologist Assistant, and communication documented in the PACS or zVision Dashboard. Electronically Signed  By: Bretta Bang III M.D.  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 Continues to have occasional cough-yellow in color and chest tightness No acute event. Had flu vaccine. Ran out of Breo inhaler.VEST does help, used up to 4 times  daily.  06/13/2015-75 year old female never smoker, former OR nurse followed for chronic bronchitis/bronchiectasis, history rhinosinusitis, complicated by GIB, DM, chronic back pain/scoliosis, renal insufficiency, hx DVT FOLLOWS FOR: OV per Dr Yetta Barre; CXR results attached to OV notes. Pt notes she is feeling more tired than usual, SOB wheezing as well. She realizes her memory is failing. Lives alone since husband died last year. Chronic cough is not much changed. Sometimes productive with  flutter device. VEST is a big help, used 2-4 times daily. Denies chest pain or fever. CT chest 04/08/2015 IMPRESSION: 1. Multifocal regions of patchy ground-glass attenuation in a peribronchovascular distribution involving the right lower, left lower and inferior left upper lungs. Additionally, there is persistent volume loss, bronchiectasis and a rounded area of masslike consolidation in the inferior left lower lobe. Interestingly, similar findings (save for the masslike consolidation) have been seen intermittently on prior imaging studies from 2015, 2013 and 2012. Overall, these findings are most consistent with a recurrent infectious/inflammatory process. Given the dependent distribution and the presence of the moderately large hiatal hernia, intermittent aspiration is a strong consideration. However, it is difficult to exclude a superimposed malignancy in the region of masslike consolidation in the left lower lobe which has not been seen in the same configuration previously. Consider pulmonology consultation for further evaluation. This region may be amenable to further evaluation of via bronchoscopy. 2. Coronary artery calcification. 3. Ectatic aortic root and ascending thoracic aorta without aneurysm. 4. Small left pleural effusion.  5. Multilevel degenerative disc disease with dextro convex scoliosis. 6. Stable enhancing soft tissue structure adjacent to the pancreatic tail dating back to  08/28/2010. Greater than 4 year stability is highly suggestive of a benign process. These results will be called to the ordering clinician or representative by the Radiologist Assistant, and communication documented in the PACS or zVision Dashboard. Electronically Signed  By: Malachy Moan M.D.  On: 04/08/2015 09:55 CXR 06/13/15 IMPRESSION: Left lower lobe airspace consolidation, likely pneumonia. Cardiomegaly with interstitial edema. Suspect a degree of underlying congestive heart failure. Sizable hiatal hernia. Question persistence of airspace consolidation since prior study 2 months ago versus re- occurrence of pneumonia in this area. This circumstance potentially may warrant bronchoscopy with particular attention to the left lower lobe bronchus. Electronically Signed  By: Bretta Bang III M.D.  On: 06/13/2015 11:11   ROS- see HPI   Constitutional:   No-   weight loss, night sweats, fevers, chills, fatigue, lassitude. HEENT:   No-  headaches, difficulty swallowing, tooth/dental problems, sore throat,       No-  sneezing, itching, ear ache, +nasal congestion, post nasal drip,  CV:  No-   chest pain, orthopnea, PND, swelling in lower extremities, anasarca, dizziness, palpitations Resp: No- acute  shortness of breath with exertion or at rest.              +  productive cough,  + non-productive cough,  No-  coughing up of blood.              +change in color of mucus.  No- wheezing.   Skin: No-   rash or lesions. GI:  No-   heartburn, indigestion, abdominal pain, nausea, vomiting, GU: MS:  No-   joint pain or swelling.  +back pain Neuro- nothing unusual Psych:  No- change in mood or affect. No depression or anxiety.  No memory loss.  Objective:   Physical Exam   General-  Oriented, Affect-appropriate, Distress- none acute  + Thin/frail elderly                   Skin- rash-none, lesions- none, excoriation- none Lymphadenopathy- none Head- atraumatic             Eyes- Gross vision intact, PERRLA, conjunctivae clear secretions            Ears- Hearing, canals-normal            Nose- Clear, no-Septal dev, mucus, polyps, erosion, perforation, .            Throat- Mallampati III , mucosa clear , drainage- none, tonsils- atrophic Neck- flexible , trachea midline, no stridor , thyroid nl, carotid no bruit Chest - symmetrical excursion , unlabored           Heart/CV- RRR , no murmur , no gallop  , no rub, nl s1 s2                           - JVD- none , edema+1, stasis changes- none, varices- none           Lung-  + usual squeaks and mild rattle, unlabored, rhonchi+ left base, no wheeze, dullness-none, rub- none, unlabored;             Chest wall- +right posterior lower thoracic ribs bulge reflecting scoliosis Abd-  Br/ Gen/ Rectal- Not done, not indicated Extrem- cyanosis- none, clubbing, none, atrophy- none, strength- nl Neuro- grossly intact to observation

## 2015-06-18 DIAGNOSIS — J479 Bronchiectasis, uncomplicated: Secondary | ICD-10-CM | POA: Insufficient documentation

## 2015-06-18 NOTE — Assessment & Plan Note (Signed)
I favor this being an area of recurrent pneumonia and scarring. I will discuss possible bronchoscopy with my associates.

## 2015-06-18 NOTE — Assessment & Plan Note (Signed)
Chronic recurrent aspiration and repeated pneumonias. I don't suspect neoplasm in the left base currently but we need to continue to watch. I told her I would like to discuss bronchoscopy for pulmonary toilet and evaluation of left base. I think she would agree, but not eagerly.

## 2015-06-22 ENCOUNTER — Telehealth: Payer: Self-pay | Admitting: Pulmonary Disease

## 2015-06-22 NOTE — Telephone Encounter (Signed)
Case reviewed with Dr. Maple HudsonYoung and reviewed CT chest from December 2016 and Chest Xray from February 2017.  Spoke with pt.  Explained concern with non resolving pneumonia, and need for bronchoscopy.  She does not feel like she needs to have anything extra done at this time, and doesn't think she can get anyone to bring her for bronchoscopy >> she did not want to consent for procedure at this time.  I then suggested to get repeat CT chest to compare to imaging study from December 2016.  She said she would be okay with that, but then restated that she didn't want to have anything unnecessary done, and then hung up the phone.  I will defer to Dr. Maple HudsonYoung and Dr. Yetta BarreJones to follow up with pt and discuss whether she would want to have bronchoscopy or repeat imaging studies done.

## 2015-06-22 NOTE — Telephone Encounter (Signed)
I think what she has is chronic aspiration with bronchiectasis and chronic LLL pneumonia/ scarring. I think cancer is unlikely. Her decision is not surprising. We will follow her.

## 2015-06-22 NOTE — Telephone Encounter (Signed)
Called and spoke with pt. Reviewed CY's recs. Verified next ov with CY on 08/11/15. Pt voiced understanding and had no further questions.

## 2015-07-03 ENCOUNTER — Other Ambulatory Visit: Payer: Self-pay | Admitting: Internal Medicine

## 2015-07-04 NOTE — Telephone Encounter (Signed)
i'm not showing this on patients current med list----please advise, thanks

## 2015-07-06 DIAGNOSIS — H40031 Anatomical narrow angle, right eye: Secondary | ICD-10-CM | POA: Diagnosis not present

## 2015-07-06 DIAGNOSIS — H40033 Anatomical narrow angle, bilateral: Secondary | ICD-10-CM | POA: Diagnosis not present

## 2015-08-01 ENCOUNTER — Ambulatory Visit: Payer: Medicare Other | Admitting: Internal Medicine

## 2015-08-02 ENCOUNTER — Ambulatory Visit: Payer: Medicare Other | Admitting: Internal Medicine

## 2015-08-11 ENCOUNTER — Ambulatory Visit (INDEPENDENT_AMBULATORY_CARE_PROVIDER_SITE_OTHER)
Admission: RE | Admit: 2015-08-11 | Discharge: 2015-08-11 | Disposition: A | Discharge status: Home / Self Care | Payer: Medicare Other | Source: Ambulatory Visit | Attending: Internal Medicine | Admitting: Internal Medicine

## 2015-08-11 ENCOUNTER — Encounter: Payer: Self-pay | Admitting: Internal Medicine

## 2015-08-11 ENCOUNTER — Ambulatory Visit (INDEPENDENT_AMBULATORY_CARE_PROVIDER_SITE_OTHER): Payer: Medicare Other | Admitting: Internal Medicine

## 2015-08-11 VITALS — BP 120/72 | HR 80 | Ht 59.0 in | Wt 109.2 lb

## 2015-08-11 DIAGNOSIS — J449 Chronic obstructive pulmonary disease, unspecified: Secondary | ICD-10-CM

## 2015-08-11 DIAGNOSIS — J189 Pneumonia, unspecified organism: Secondary | ICD-10-CM | POA: Diagnosis not present

## 2015-08-11 DIAGNOSIS — J479 Bronchiectasis, uncomplicated: Secondary | ICD-10-CM

## 2015-08-11 NOTE — Progress Notes (Signed)
Patient ID: Sheila Cisneros, female    DOB: [birth year], 75 y.o.   MRN: [id number] HPI  07/31/10- 70 yoF followed for chronic bronchitis/ bronchiectasis and hx rhinosinusitis. Never smoker. Last here- January 30, 2010. She remained congested throough the winter, but she put up with it. No events needing medical visits. Pollen now makes cough somewhat more constant. Rarely productive. Denies fever or night sweats. Nebulizer still helps, used about twice daily. Denies  Chest pain or headache.   09/05/10- Chronic bronchitis, bronchiectasis, hx rhinosinusitis Reports increased chest congestion,producitve cough dark yellow.  Denies fever or sweat.She apparently didn't actually get a script for Daliresp last visit. We discussed side effects again. She is trying to exercise- water aerobics and walking.  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  Acute visit. Cough worse in past week. Was put on prednisone 2 weeks ago for hip- didn't help chest-  pred ended yesterday. Occasional hot flash but no sustained fever. Continues Daliresp.  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  She notices a little routine cough but it has been more productive with some darkening sputum and thicker mucus in the last week. No fever, sore throat or sweats. Sinuses feel normal. We had previously tried [employer]- she remembers it kept her awake at night. We talked about trying it again if necessary to suppress her repeated bronchitis.  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  She comes for an acute visit. She has been around a lot of family recently. 2 days ago she thinks she went outdoors too quickly after her bath and may have gotten chills. Next on waking, she noted sore throat no fever no stomach upset. No  sinus congestion but sniffing a lot. Mainly complains of chest congestion and rattle cough productive of thick yellow sputum which has been sometimes brown. She started herself on doxycycline. We discussed her earlier experience with Daliresp. She never took it, although she bought it, because she disliked the printed side effect list. I went over those and my own experience with this drug again today. Has had flu shot this year.  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  Has had flu vaccine. After last visit she tried Daliresp but found it too expensive. She keeps her persistent deep chest rattle and coughs scant gray sputum. Does not really feel badly and is able to exercise. Does sometimes cough until she retches. Does not recognize reflux or sinus pain/drainage. She is using her Flutter device twice daily for pulmonary toilet.  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  CT 08/25/10-  1. Bilateral cylindrical type atelectasis within the lower lobes.  2. Bilateral lower lobe airspace densities, likely the sequela of  inflammation or infection. Suggest short-term follow-up  examination to ensure resolution.  3. Scattered small nodules within the right upper lobe are also  likely related to inflammation or infection. A short-term follow-  up examination in 3 months is recommended to ensure resolution.  Original Report Authenticated By: Rosealee AlbeeAYLOR H. STROUD, M.D.  Had flu vax. Mucomyst is not available - prescribed last visit.  Felt well after last here until took chill first of this week. Sore throat, increased productive cough and congestion. Head stuffy, no HA. Sputum cx  02/26/11- grew E. Coli. Septra was called in and helped, after refill she has now been off x 1 week.  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  At last office visit she stopped Daliresp as ineffective. We had given Ceftin for Escherichia coli on sputum culture. She admits cough has been better but remains productive of yellow sputum off and on. Denies chest pain, fever, chills, enlarged nodes.  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 Recently had flare up last week due to pollen but has gotten better now. She noted mostly nasal stuffiness. Not much cough or wheeze and no recent need for antibiotics.  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  Patient states that cough has been "off and on", depends on weather. pt c/o head congestion today Her chronic cough is very sensitive to weather changes. She asked me to evaluate a bulge on her back, demonstrating scoliosis with protrusion of right posterior rib cage. Relatively little productive cough recently and no fever or blood. CXR 02/04/12-reviewed IMPRESSION:  Stable basilar scarring. Stable cardiomegaly. No active lung  disease.  Original Report Authenticated By: Juline PatchPAUL D. BARRY, M.D.   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 FOLLOWS FOR:  States breathing "comes and goes."  Congestion this am with gray mucus and wheezing.denies having cold or acute infection. Low back pain, pending injection.  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 Follow up.  C/o back pain and has taken pain med for this.  Prod cough with dark mucus since yesterday.  No f/c/s, wheezing, chest tightness, or chest pain.   CXR 11/09/12 IMPRESSION:  Stable chronic basilar lung disease. No acute cardiopulmonary  process.  Original Report Authenticated By: Carey BullocksWilliam Veazey,  M.D.  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 Sister-in-law is here FOLLOWS FOR:  In hospital 8/26 - 8/31 with altered mental status attributed to pain meds.  Still complaining of swelling in both feet/ankles, SOB and cough w/grayish mucus Back pain remains a problem. Brain MRI positive for air-fluid levels in sinuses/ acute sinusitis. She denies facial pain or headache now after treatment with Cleocin and Biaxin. CXR 1 V  8/  /14 IMPRESSION:  1. Streaky bibasilar atelectasis and low lung volumes.  2. 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  Original Report Authenticated By: Britta MccreedySusan Turner, M.D.  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 Sister-in-law is here FOLLOWS FOR: continues to cough-productive at times-clear to brown in color. She reports fainting after getting out of a sauna yesterday. Sounds postural after heat. Ok now.  CXR 06/30/13 IMPRESSION:  No acute cardiopulmonary disease.  Electronically Signed  By: Amie Portlandavid Ormond M.D.  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 Most of her attention is really on weakness and chronic back pain. Using nebulizer 2 or 3 times a day. She had been  hospitalized earlier this year for GI bleed.  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 Has Flutter but not using. Had flu vax. Occasional nasal stuffy. Limited by chronic back pain/ scoliosis.   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  c/o intermittent chest tightness, prod cough with yellow/brown thick mucus worse when laying down.  Pt stopped albuterol HFA d/t shaking, but likes Breo.  Took antibiotic from Dr. Yetta Barre at her last office visit in late March. Like all antibiotics it has helped her briefly but then the productive cough starts again. She has tried to be diligent with a Flutter device for pulmonary toilet and with various chest physiotherapy techniques we have taught her. She has been unable to improve her chronic productive cough and chest rattle associated with her known chronic bronchiectasis known from CT scans as far back as 2012. CXR 07/14/14 IMPRESSION: Patchy infiltrate posterior left base. Mild scarring lateral right base. Sizable hiatal hernia. These results will be called to the ordering clinician or representative by the Radiologist Assistant, and communication documented in the PACS or zVision Dashboard. Electronically Signed  By: Bretta Bang III M.D.  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 Continues to have occasional cough-yellow in color and chest tightness No acute event. Had flu vaccine. Ran out of Breo inhaler.VEST does help, used up to 4 times  daily.  06/13/2015-75 year old female never smoker, former OR nurse followed for chronic bronchitis/bronchiectasis, history rhinosinusitis, complicated by GIB, DM, chronic back pain/scoliosis, renal insufficiency, hx DVT FOLLOWS FOR: OV per Dr Yetta Barre; CXR results attached to OV notes. Pt notes she is feeling more tired than usual, SOB wheezing as well. She realizes her memory is failing. Lives alone since husband died last year. Chronic cough is not much changed. Sometimes productive with  flutter device. VEST is a big help, used 2-4 times daily. Denies chest pain or fever. CT chest 04/08/2015 IMPRESSION: 1. Multifocal regions of patchy ground-glass attenuation in a peribronchovascular distribution involving the right lower, left lower and inferior left upper lungs. Additionally, there is persistent volume loss, bronchiectasis and a rounded area of masslike consolidation in the inferior left lower lobe. Interestingly, similar findings (save for the masslike consolidation) have been seen intermittently on prior imaging studies from 2015, 2013 and 2012. Overall, these findings are most consistent with a recurrent infectious/inflammatory process. Given the dependent distribution and the presence of the moderately large hiatal hernia, intermittent aspiration is a strong consideration. However, it is difficult to exclude a superimposed malignancy in the region of masslike consolidation in the left lower lobe which has not been seen in the same configuration previously. Consider pulmonology consultation for further evaluation. This region may be amenable to further evaluation of via bronchoscopy. 2. Coronary artery calcification. 3. Ectatic aortic root and ascending thoracic aorta without aneurysm. 4. Small left pleural effusion.  5. Multilevel degenerative disc disease with dextro convex scoliosis. 6. Stable enhancing soft tissue structure adjacent to the pancreatic tail dating back to  08/28/2010. Greater than 4 year stability is highly suggestive of a benign process. These results will be called to the ordering clinician or representative by the Radiologist Assistant, and communication documented in the PACS or zVision Dashboard. Electronically Signed  By: Malachy Moan M.D.  On: 04/08/2015 09:55 CXR 06/13/15 IMPRESSION: Left lower lobe airspace consolidation, likely pneumonia. Cardiomegaly with interstitial edema. Suspect a degree of underlying congestive heart failure. Sizable hiatal hernia. Question persistence of airspace consolidation since prior study 2 months ago versus re- occurrence of pneumonia in this area. This circumstance potentially may warrant bronchoscopy with particular attention to the left lower lobe bronchus. Electronically Signed  By: Bretta Bang III M.D.  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 FOLLOWS FOR: Pt states she continues to have cough and congestion; wheezing and SOB as well(worse on wet mornings and with activity) She describes cough and shortness of breath as somewhat variable from day to day. Not aware of reflux events. Uses her pneumatic vest 4 times daily. Her companion says that she stays in bed a lot-bedtime 8 PM and sometimes still embedded noon the next day when her companion calls her.  ROS- see HPI   Constitutional:   No-   weight loss, night sweats, fevers, chills, fatigue, lassitude. HEENT:   No-  headaches, difficulty swallowing, tooth/dental problems, sore throat,       No-  sneezing, itching, ear ache, +nasal congestion, post nasal drip,  CV:  No-   chest pain, orthopnea, PND, swelling in  lower extremities, anasarca, dizziness, palpitations Resp: + shortness of breath with exertion or at rest.              +  productive cough,  + non-productive cough,  No-  coughing up of blood.              change in color of mucus.  No- wheezing.   Skin: No-   rash or lesions. GI:  No-   heartburn, indigestion, abdominal pain, nausea, vomiting, GU: MS:  No-   joint pain or swelling.  +back pain Neuro- nothing unusual Psych:  No- change in mood or affect. No depression or anxiety.  No memory loss.  Objective:   Physical Exam   General-  Oriented, Affect-appropriate, Distress- none acute  + Thin/frail elderly                   Skin- rash-none, lesions- none, excoriation- none Lymphadenopathy- none Head- atraumatic            Eyes- Gross vision intact, PERRLA, conjunctivae clear secretions            Ears- Hearing, canals-normal            Nose- Clear, no-Septal dev, mucus, polyps, erosion, perforation, .            Throat- Mallampati III , mucosa clear , drainage- none, tonsils- atrophic Neck- flexible , trachea midline, no stridor , thyroid nl, carotid no bruit Chest - symmetrical excursion , unlabored           Heart/CV- RRR , no murmur , no gallop  , no rub,  nl s1 s2                           - JVD- none , edema+1, stasis changes- none, varices- none           Lung-  + usual squeaks and mild rattle, unlabored, rhonchi+ slight at left base, no wheeze, dullness-none, rub- none, unlabored;             Chest wall- +right posterior lower thoracic ribs bulge reflecting scoliosis Abd-  Br/ Gen/ Rectal- Not done, not indicated Extrem- cyanosis- none, clubbing, none, atrophy- none, strength- nl Neuro- grossly intact to observation

## 2015-08-11 NOTE — Patient Instructions (Signed)
Letter- ok to participate in water aerobics, steam room and sauna as tolerated  Continue using the pneumatic vest to help keep lungs cleared  Order- CXR   Dx    Bronchiectasis  Please call as needed

## 2015-08-12 NOTE — Progress Notes (Signed)
Quick Note:  LVM for pt to return call ______ 

## 2015-08-14 NOTE — Assessment & Plan Note (Signed)
Chronic bronchitis/bronchiectasis without acute exacerbation. Etiology is probably recurrent aspiration although she doesn't recognize it.. She insists she is being compliant with her pneumatic vest treatments 4 times daily and that the therapy helps. We again discussed bronchoscopy for pulmonary toilet. She doesn't seem to remember much of previous discussions. Plan-chest x-ray

## 2015-08-14 NOTE — Assessment & Plan Note (Signed)
Okay to be physically active including water aerobics as tolerated, as long as she avoids chilling and exhaustion.

## 2015-08-15 ENCOUNTER — Other Ambulatory Visit: Payer: Self-pay | Admitting: Internal Medicine

## 2015-08-15 DIAGNOSIS — R918 Other nonspecific abnormal finding of lung field: Secondary | ICD-10-CM

## 2015-08-16 ENCOUNTER — Other Ambulatory Visit: Payer: Self-pay | Admitting: Internal Medicine

## 2015-08-16 DIAGNOSIS — R918 Other nonspecific abnormal finding of lung field: Secondary | ICD-10-CM

## 2015-08-17 ENCOUNTER — Other Ambulatory Visit (INDEPENDENT_AMBULATORY_CARE_PROVIDER_SITE_OTHER): Payer: Medicare Other

## 2015-08-17 DIAGNOSIS — R918 Other nonspecific abnormal finding of lung field: Secondary | ICD-10-CM

## 2015-08-17 DIAGNOSIS — J984 Other disorders of lung: Secondary | ICD-10-CM

## 2015-08-17 LAB — BASIC METABOLIC PANEL
BUN: 23 mg/dL (ref 6–23)
CALCIUM: 10.1 mg/dL (ref 8.4–10.5)
CO2: 32 mEq/L (ref 19–32)
Chloride: 101 mEq/L (ref 96–112)
Creatinine, Ser: 1.2 mg/dL (ref 0.40–1.20)
GFR: 56.43 mL/min — AB (ref >60.00)
GLUCOSE: 155 mg/dL — AB (ref 70–99)
Potassium: 3.4 mEq/L — ABNORMAL LOW (ref 3.5–5.1)
Sodium: 143 mEq/L (ref 135–145)

## 2015-08-18 ENCOUNTER — Ambulatory Visit (INDEPENDENT_AMBULATORY_CARE_PROVIDER_SITE_OTHER)
Admission: RE | Admit: 2015-08-18 | Discharge: 2015-08-18 | Disposition: A | Discharge status: Home / Self Care | Payer: Medicare Other | Source: Ambulatory Visit | Attending: Internal Medicine | Admitting: Internal Medicine

## 2015-08-18 DIAGNOSIS — J479 Bronchiectasis, uncomplicated: Secondary | ICD-10-CM | POA: Diagnosis not present

## 2015-08-18 DIAGNOSIS — R918 Other nonspecific abnormal finding of lung field: Secondary | ICD-10-CM

## 2015-08-18 MED ORDER — IOPAMIDOL (ISOVUE-300) INJECTION 61%
80.0000 mL | Freq: Once | INTRAVENOUS | Status: AC | PRN
  Administered 2015-08-18: 80 mL via INTRAVENOUS

## 2015-09-01 ENCOUNTER — Other Ambulatory Visit: Payer: Self-pay | Admitting: Internal Medicine

## 2015-09-05 ENCOUNTER — Encounter: Payer: Self-pay | Admitting: *Deleted

## 2015-10-11 ENCOUNTER — Telehealth: Payer: Self-pay | Admitting: Internal Medicine

## 2015-10-11 NOTE — Telephone Encounter (Signed)
Would like to know if Dr. Yetta BarreJones would agree [facility].  Patient has dementia.  She does not have a caregiver and not able to take care of herself.  States patients blood pressure is elevated and she has cough.  Please follow back up.

## 2015-10-12 ENCOUNTER — Other Ambulatory Visit: Payer: Self-pay | Admitting: Internal Medicine

## 2015-10-12 DIAGNOSIS — J449 Chronic obstructive pulmonary disease, unspecified: Secondary | ICD-10-CM

## 2015-10-12 DIAGNOSIS — F0390 Unspecified dementia without behavioral disturbance: Secondary | ICD-10-CM

## 2015-10-12 NOTE — Telephone Encounter (Signed)
[facility] Needs to be seen for the other concerns

## 2015-10-12 NOTE — Telephone Encounter (Signed)
Got patient scheduled for 7/3 at 11:00am.

## 2015-10-14 ENCOUNTER — Telehealth: Payer: Self-pay

## 2015-10-14 NOTE — Telephone Encounter (Signed)
NP states pt is Demented. Concerned she is not taking her meds. States she is not eating well. States positive for depression but not suicidal. Was calling to request Home Health. Informed this has already been done.

## 2015-10-17 ENCOUNTER — Encounter: Payer: Self-pay | Admitting: Internal Medicine

## 2015-10-17 ENCOUNTER — Ambulatory Visit (INDEPENDENT_AMBULATORY_CARE_PROVIDER_SITE_OTHER)
Admission: RE | Admit: 2015-10-17 | Discharge: 2015-10-17 | Disposition: A | Discharge status: Home / Self Care | Payer: Medicare Other | Source: Ambulatory Visit | Attending: Internal Medicine | Admitting: Internal Medicine

## 2015-10-17 ENCOUNTER — Ambulatory Visit (INDEPENDENT_AMBULATORY_CARE_PROVIDER_SITE_OTHER): Payer: Medicare Other | Admitting: Internal Medicine

## 2015-10-17 VITALS — BP 128/86 | HR 78 | Temp 98.3°F | Resp 16 | Ht 59.0 in | Wt 112.0 lb

## 2015-10-17 DIAGNOSIS — J988 Other specified respiratory disorders: Secondary | ICD-10-CM | POA: Diagnosis not present

## 2015-10-17 DIAGNOSIS — R05 Cough: Secondary | ICD-10-CM | POA: Diagnosis not present

## 2015-10-17 DIAGNOSIS — J449 Chronic obstructive pulmonary disease, unspecified: Secondary | ICD-10-CM

## 2015-10-17 DIAGNOSIS — J22 Unspecified acute lower respiratory infection: Secondary | ICD-10-CM | POA: Insufficient documentation

## 2015-10-17 DIAGNOSIS — R918 Other nonspecific abnormal finding of lung field: Secondary | ICD-10-CM

## 2015-10-17 DIAGNOSIS — J984 Other disorders of lung: Secondary | ICD-10-CM

## 2015-10-17 DIAGNOSIS — R059 Cough, unspecified: Secondary | ICD-10-CM | POA: Insufficient documentation

## 2015-10-17 MED ORDER — SULFAMETHOXAZOLE-TRIMETHOPRIM 800-160 MG PO TABS: 1.0000 | ORAL_TABLET | Freq: Two times a day (BID) | ORAL | Status: AC

## 2015-10-17 MED ORDER — TIOTROPIUM BROMIDE-OLODATEROL 2.5-2.5 MCG/ACT IN AERS: 2.0000 | INHALATION_SPRAY | Freq: Every day | RESPIRATORY_TRACT | Status: DC

## 2015-10-17 NOTE — Patient Instructions (Signed)

## 2015-10-17 NOTE — Progress Notes (Signed)
Subjective:  Patient ID: Sheila Cisneros, female    DOB: [birth year]  Age: 75 y.o. MRN: [id number]  CC: COPD and Cough   HPI Ressie Maggie SchwalbeL Weatherholtz presents for a one-month history of cough that is productive of thick yellow phlegm. She has had no worsening of her shortness of breath or wheezing. She denies fever, chills, night sweats, hemoptysis, or weight loss. She is not getting much symptom relief from her current inhaler Anoro.  Outpatient Prescriptions Prior to Visit  Medication Sig Dispense Refill  . acetaminophen (TYLENOL) 500 MG tablet Take 500 mg by mouth every 6 (six) hours as needed. For pain    . Ascorbic Acid (VITAMIN C) 500 MG tablet Take 500 mg by mouth daily.      . cholecalciferol (VITAMIN D) 1000 UNITS tablet Take 1,000 Units by mouth daily.    Marland Kitchen. donepezil (ARICEPT) 5 MG tablet TAKE 1 TABLET (5 MG TOTAL) BY MOUTH AT BEDTIME. 90 tablet 3  . feeding supplement, ENSURE COMPLETE, (ENSURE COMPLETE) LIQD Take 237 mLs by mouth 2 (two) times daily between meals.    Marland Kitchen. glucose blood test strip Use to check blood sugar once daily. 100 each 4  . hydrochlorothiazide (MICROZIDE) 12.5 MG capsule Take 1 capsule (12.5 mg total) by mouth daily. 90 capsule 3  . IRON PO Take 1 tablet by mouth daily.    Marland Kitchen. loratadine (CLARITIN) 10 MG tablet TAKE 1 TABLET (10 MG TOTAL) BY MOUTH DAILY. 30 tablet 6  . MELATONIN PO Take by mouth.    . Multiple Vitamin (MULTIVITAMIN) capsule Take 1 capsule by mouth daily.      . potassium chloride (KLOR-CON) 20 MEQ packet Take 20 mEq by mouth 2 (two) times daily. 180 tablet 3  . simvastatin (ZOCOR) 20 MG tablet Take 1 tablet (20 mg total) by mouth at bedtime. 90 tablet 3  . traZODone (DESYREL) 50 MG tablet Take 1 tablet (50 mg total) by mouth at bedtime. 90 tablet 3  . venlafaxine (EFFEXOR) 37.5 MG tablet Take 37.5 mg by mouth daily as needed (hot flashes).    . verapamil (COVERA HS) 240 MG (CO) 24 hr tablet Take 1 tablet (240 mg total) by mouth at bedtime. 90 tablet 3  .  NAMZARIC 7 & 14 & 21 &28 -10 MG C4PK Take 1 tablet by mouth daily. 28 each 0  . Umeclidinium-Vilanterol (ANORO ELLIPTA) 62.5-25 MCG/INH AEPB Inhale 1 puff into the lungs daily. 30 each 11   No facility-administered medications prior to visit.    ROS Review of Systems  Constitutional: Negative.  Negative for fever, chills, activity change, appetite change and fatigue.  HENT: Negative.  Negative for sinus pressure, sore throat and trouble swallowing.   Eyes: Negative.   Respiratory: Positive for cough, shortness of breath and wheezing. Negative for choking, chest tightness and stridor.   Cardiovascular: Negative.  Negative for chest pain, palpitations and leg swelling.  Gastrointestinal: Negative.  Negative for nausea, vomiting, abdominal pain, diarrhea, constipation and blood in stool.  Endocrine: Negative.   Genitourinary: Negative.   Musculoskeletal: Negative.   Skin: Negative.   Allergic/Immunologic: Negative.   Neurological: Negative.  Negative for dizziness, tremors, weakness, light-headedness, numbness and headaches.  Hematological: Negative.  Negative for adenopathy. Does not bruise/bleed easily.  Psychiatric/Behavioral: Negative.     Objective:  BP 128/86 mmHg  Pulse 78  Temp(Src) 98.3 F (36.8 C) (Oral)  Resp 16  Ht 4\' 11"  (1.499 m)  Wt 112 lb (50.803 kg)  BMI 22.61  kg/m2  SpO2 97%  BP Readings from Last 3 Encounters:  10/17/15 128/86  08/11/15 120/72  06/13/15 128/80    Wt Readings from Last 3 Encounters:  10/17/15 112 lb (50.803 kg)  08/11/15 109 lb 3.2 oz (49.533 kg)  06/13/15 106 lb 9.6 oz (48.353 kg)    Physical Exam  Constitutional: She is oriented to person, place, and time.  Non-toxic appearance. She does not have a sickly appearance. She does not appear ill. No distress.  HENT:  Mouth/Throat: Oropharynx is clear and moist. No oropharyngeal exudate.  Eyes: Conjunctivae are normal. Right eye exhibits no discharge. Left eye exhibits no discharge. No  scleral icterus.  Neck: Normal range of motion. Neck supple. No JVD present. No tracheal deviation present. No thyromegaly present.  Cardiovascular: Normal rate, regular rhythm, normal heart sounds and intact distal pulses.  Exam reveals no gallop and no friction rub.   No murmur heard. Pulmonary/Chest: Effort normal. No stridor. No respiratory distress. She has no decreased breath sounds. She has no wheezes. She has rhonchi in the right upper field, the right middle field, the right lower field, the left upper field, the left middle field and the left lower field. She has no rales. She exhibits no tenderness.  There are diffuse, bilateral inspiratory rhonchi but good air movement and no wheezing  Abdominal: Soft. Bowel sounds are normal. She exhibits no distension. There is no tenderness. There is no rebound and no guarding.  Musculoskeletal: Normal range of motion. She exhibits no edema or tenderness.  Lymphadenopathy:    She has no cervical adenopathy.  Neurological: She is oriented to person, place, and time.  Skin: Skin is warm and dry. No rash noted. She is not diaphoretic. No erythema. No pallor.  Psychiatric: Judgment normal. Her mood appears not anxious. Her affect is not blunt and not inappropriate. Her speech is delayed and tangential. She is slowed and withdrawn. Cognition and memory are impaired. She does not exhibit a depressed mood. She exhibits abnormal recent memory and abnormal remote memory. She is inattentive.    Lab Results  Component Value Date   WBC 9.0 03/23/2015   HGB 12.4 03/23/2015   HCT 38.3 03/23/2015   PLT 292.0 03/23/2015   GLUCOSE 155* 08/17/2015   CHOL 186 03/23/2015   TRIG 81.0 03/23/2015   HDL 72.10 03/23/2015   LDLCALC 98 03/23/2015   ALT 26 03/23/2015   AST 28 03/23/2015   NA 143 08/17/2015   K 3.4* 08/17/2015   CL 101 08/17/2015   CREATININE 1.20 08/17/2015   BUN 23 08/17/2015   CO2 32 08/17/2015   TSH 0.97 03/23/2015   INR 1.17 06/04/2013    HGBA1C 6.2 03/23/2015   MICROALBUR 11.4* 03/23/2015    Ct Chest W Contrast  08/18/2015  CLINICAL DATA:  Chronic productive cough. Evaluate for mass in the left lower lobe. EXAM: CT CHEST WITH CONTRAST TECHNIQUE: Multidetector CT imaging of the chest was performed during intravenous contrast administration. CONTRAST:  80mL ISOVUE-300 IOPAMIDOL (ISOVUE-300) INJECTION 61% COMPARISON:  Radiographs 08/11/2015.  CT 04/08/2015. FINDINGS: Mediastinum/Nodes: There is progressive, partially confluent mediastinal lymphadenopathy. Right paratracheal node measures 1.8 x 1.2 cm on image 47. AP window node measures 2.6 x 1.8 cm on image 47. There is a subcarinal node measuring 2.1 x 1.4 cm on image 61. Prominent hilar lymph nodes are present bilaterally. There is also a prominent subpectoral node on the left, measuring 2.5 x 0.9 cm on image 20. Moderate size hiatal hernia again noted. The  heart size is normal. There is no pericardial effusion. There are no significant vascular findings. Lungs/Pleura: There is a small amount of pleural fluid on the left. Bronchiectasis and airway thickening are again noted in the lower lobes. There are patchy peribronchial pulmonary opacities bilaterally with increased confluence and volume loss in the left lower lobe compared with the prior CT. The superimposed ground-glass densities more superiorly in both lower lobes on the prior examination have resolved. There is otherwise stable pulmonary nodularity, with the largest nodules in the right upper lobe on images 35 and 49. Upper abdomen: The visualized upper abdomen demonstrates no suspicious findings. There is a stable cyst peripherally in the right hepatic lobe. The pancreatic tail appears unchanged. Musculoskeletal/Chest wall: There is no chest wall mass or suspicious osseous finding. Stable moderate convex right thoracolumbar scoliosis and associated spondylosis. IMPRESSION: 1. Overall similar findings to previous study with central  airway thickening, bronchiectasis and patchy lower lobe airspace opacities. There is increased confluence and volume loss in the left lower lobe. These findings remain suspicious for chronic aspiration pneumonia. 2. Progressive confluent mediastinal and hilar adenopathy with prominent asymmetric left subpectoral lymph node. Given the apparent pulmonary inflammatory findings, these lymph nodes may be reactive, although are nonspecific. 3. Moderate size hiatal hernia. Electronically Signed   By: Carey Bullocks M.D.   On: 08/18/2015 16:00    Assessment & Plan:   Jentry was seen today for copd and cough.  Diagnoses and all orders for this visit:  Chronic obstructive pulmonary disease, unspecified COPD, unspecified chronic bronchitis type- I've asked her to try Stiolto respimet, I will also use the manufacturer's offering to send a respiratory therapist to her house, and in the office I showed her how to use the device and she demonstrated proficiency with its use. -     Tiotropium Bromide-Olodaterol (STIOLTO RESPIMAT) 2.5-2.5 MCG/ACT AERS; Inhale 2 puffs into the lungs daily.  Mass of lower lobe of left lung- improvement noted on CXR -     DG Chest 2 View; Future  Cough- her CXR is negative for mass, questionable for bilateral lower lobe PNA -     DG Chest 2 View; Future  LRTI (lower respiratory tract infection)- She has multiple antibiotic drug allergies, I will treat the infection with Bactrim-DS -     sulfamethoxazole-trimethoprim (BACTRIM DS,SEPTRA DS) 800-160 MG tablet; Take 1 tablet by mouth 2 (two) times daily.   I have discontinued Ms. Goley's umeclidinium-vilanterol and NAMZARIC. I am also having her start on Tiotropium Bromide-Olodaterol and sulfamethoxazole-trimethoprim. Additionally, I am having her maintain her multivitamin, acetaminophen, vitamin C, cholecalciferol, IRON PO, venlafaxine, feeding supplement (ENSURE COMPLETE), MELATONIN PO, simvastatin, verapamil, hydrochlorothiazide,  potassium chloride, glucose blood, traZODone, donepezil, and loratadine.  Meds ordered this encounter  Medications  . Tiotropium Bromide-Olodaterol (STIOLTO RESPIMAT) 2.5-2.5 MCG/ACT AERS    Sig: Inhale 2 puffs into the lungs daily.    Dispense:  4 g    Refill:  11  . sulfamethoxazole-trimethoprim (BACTRIM DS,SEPTRA DS) 800-160 MG tablet    Sig: Take 1 tablet by mouth 2 (two) times daily.    Dispense:  20 tablet    Refill:  1     Follow-up: No Follow-up on file.  Sanda Linger, MD

## 2015-10-17 NOTE — Progress Notes (Signed)
Pre visit review using our clinic review tool, if applicable. No additional management support is needed unless otherwise documented below in the visit note. 

## 2015-12-01 ENCOUNTER — Other Ambulatory Visit (INDEPENDENT_AMBULATORY_CARE_PROVIDER_SITE_OTHER): Payer: Medicare Other

## 2015-12-01 ENCOUNTER — Encounter: Payer: Self-pay | Admitting: Internal Medicine

## 2015-12-01 ENCOUNTER — Ambulatory Visit (INDEPENDENT_AMBULATORY_CARE_PROVIDER_SITE_OTHER): Payer: Medicare Other | Admitting: Internal Medicine

## 2015-12-01 VITALS — BP 134/86 | HR 73 | Temp 98.5°F | Resp 16 | Ht 59.0 in | Wt 112.0 lb

## 2015-12-01 DIAGNOSIS — I10 Essential (primary) hypertension: Secondary | ICD-10-CM

## 2015-12-01 DIAGNOSIS — E118 Type 2 diabetes mellitus with unspecified complications: Secondary | ICD-10-CM

## 2015-12-01 DIAGNOSIS — Z0000 Encounter for general adult medical examination without abnormal findings: Secondary | ICD-10-CM

## 2015-12-01 DIAGNOSIS — J449 Chronic obstructive pulmonary disease, unspecified: Secondary | ICD-10-CM

## 2015-12-01 LAB — BASIC METABOLIC PANEL
BUN: 22 mg/dL (ref 6–23)
CHLORIDE: 104 meq/L (ref 96–112)
CO2: 32 mEq/L (ref 19–32)
Calcium: 10 mg/dL (ref 8.4–10.5)
Creatinine, Ser: 1.05 mg/dL (ref 0.40–1.20)
GFR: 65.78 mL/min (ref >60.00)
Glucose, Bld: 92 mg/dL (ref 70–99)
POTASSIUM: 3.7 meq/L (ref 3.5–5.1)
Sodium: 143 mEq/L (ref 135–145)

## 2015-12-01 LAB — CBC WITH DIFFERENTIAL/PLATELET
BASOS ABS: 0 10*3/uL (ref 0.0–0.1)
Basophils Relative: 0.4 % (ref 0.0–3.0)
EOS ABS: 0 10*3/uL (ref 0.0–0.7)
Eosinophils Relative: 0.6 % (ref 0.0–5.0)
HEMATOCRIT: 35.7 % — AB (ref 36.0–46.0)
Hemoglobin: 12 g/dL (ref 12.0–15.0)
LYMPHS ABS: 2.9 10*3/uL (ref 0.7–4.0)
Lymphocytes Relative: 37.7 % (ref 12.0–46.0)
MCHC: 33.6 g/dL (ref 30.0–36.0)
MCV: 88.1 fl (ref 78.0–100.0)
Monocytes Absolute: 0.5 10*3/uL (ref 0.1–1.0)
Monocytes Relative: 6 % (ref 3.0–12.0)
NEUTROS ABS: 4.2 10*3/uL (ref 1.4–7.7)
NEUTROS PCT: 55.3 % (ref 43.0–77.0)
PLATELETS: 328 10*3/uL (ref 150.0–400.0)
RBC: 4.06 Mil/uL (ref 3.87–5.11)
RDW: 14.4 % (ref 11.5–15.5)
WBC: 7.6 10*3/uL (ref 4.0–10.5)

## 2015-12-01 LAB — HEMOGLOBIN A1C: HEMOGLOBIN A1C: 6.2 % (ref 4.6–6.5)

## 2015-12-01 NOTE — Progress Notes (Signed)
Subjective:  Patient ID: Sheila Cisneros, female    DOB: 1940/09/01  Age: 75 y.o. MRN: [id number]  CC: Hypertension; Annual Exam; and Diabetes   HPI [name] presents for a CPX.   She tells me she is in her usual state of health and has no major concerns today. She has a chronic, ongoing cough that is really productive of clear phlegm. She denies hemoptysis, wheezing, night sweats, weight loss or worsening shortness of breath. She does not use the Spiriva inhaler very frequently.  She tells me her blood pressure is well-controlled with a combination of hydrochlorothiazide and verapamil. She denies any recent episodes of palpitations/edema/chest pain/shortness of breath/or diaphoresis.  Past Medical History:  Diagnosis Date  . Altered mental status 12/09/2012  . Arthritis    "all over" (12/09/2012)  . Chronic bronchitis (HCC)    "couple times/yr" (12/09/2012)  . Chronic lower back pain   . COPD (chronic obstructive pulmonary disease) (HCC)   . Diabetes mellitus without complication (HCC)   . Exertional shortness of breath   . GERD (gastroesophageal reflux disease)    rolaids if needed  . Gout, unspecified   . Hepatitis, unspecified    "the one that's not bad" (12/09/2012)  . History of blood transfusion    "related to a surgery, I think" (12/09/2012)  . Hyperlipidemia   . Hypertension   . Migraines    "in the past" (12/09/2012)  . Osteoarthrosis, unspecified whether generalized or localized, unspecified site   . Other chronic sinusitis   . Pneumonia    "couple times; long time ago" (12/09/2012)  . Unspecified chronic bronchitis (HCC)    sees Dr. Melba Coon. Young, last visit 08/2011, treated for E. Coli- resp. /w Ceftin, Feb. 2013   Past Surgical History:  Procedure Laterality Date  . ABDOMINAL HYSTERECTOMY     "w/right ovary" (12/09/2012)  . APPENDECTOMY    . BREAST BIOPSY Right   . CARPAL TUNNEL RELEASE  09/06/2011   Procedure: CARPAL TUNNEL RELEASE;  Surgeon: Kennieth RadArthur F Carter,  MD;  [facility];  Service: Orthopedics;  Laterality: Right;  . ESOPHAGOGASTRODUODENOSCOPY N/A 06/04/2013   Procedure: ESOPHAGOGASTRODUODENOSCOPY (EGD);  Surgeon: Graylin ShiverSalem F Ganem, MD;  [facility];  Service: Endoscopy;  Laterality: N/A;  . SHOULDER ARTHROSCOPY W/ ROTATOR CUFF REPAIR Right   . TONSILLECTOMY    . TUBAL LIGATION      reports that she has never smoked. She has never used smokeless tobacco. She reports that she drinks alcohol. She reports that she does not use drugs. family history includes ALS in her brother; Heart disease in her father. Allergies  Allergen Reactions  . Codeine Rash     angioedema  . Neomycin Itching  . Penicillins Itching and Rash       . Tetanus Toxoids Rash  . Levofloxacin Rash    Outpatient Medications Prior to Visit  Medication Sig Dispense Refill  . acetaminophen (TYLENOL) 500 MG tablet Take 500 mg by mouth every 6 (six) hours as needed. For pain    . Ascorbic Acid (VITAMIN C) 500 MG tablet Take 500 mg by mouth daily.      . cholecalciferol (VITAMIN D) 1000 UNITS tablet Take 1,000 Units by mouth daily.    Marland Kitchen. donepezil (ARICEPT) 5 MG tablet TAKE 1 TABLET (5 MG TOTAL) BY MOUTH AT BEDTIME. 90 tablet 3  . feeding supplement, ENSURE COMPLETE, (ENSURE COMPLETE) LIQD Take 237 mLs by mouth 2 (two) times daily between meals.    .Marland Kitchen  glucose blood test strip Use to check blood sugar once daily. 100 each 4  . hydrochlorothiazide (MICROZIDE) 12.5 MG capsule Take 1 capsule (12.5 mg total) by mouth daily. 90 capsule 3  . IRON PO Take 1 tablet by mouth daily.    Marland Kitchen loratadine (CLARITIN) 10 MG tablet TAKE 1 TABLET (10 MG TOTAL) BY MOUTH DAILY. 30 tablet 6  . MELATONIN PO Take by mouth.    . Multiple Vitamin (MULTIVITAMIN) capsule Take 1 capsule by mouth daily.      . potassium chloride (KLOR-CON) 20 MEQ packet Take 20 mEq by mouth 2 (two) times daily. 180 tablet 3  . simvastatin (ZOCOR) 20 MG tablet Take 1 tablet (20 mg total) by mouth at bedtime. 90  tablet 3  . Tiotropium Bromide-Olodaterol (STIOLTO RESPIMAT) 2.5-2.5 MCG/ACT AERS Inhale 2 puffs into the lungs daily. 4 g 11  . traZODone (DESYREL) 50 MG tablet Take 1 tablet (50 mg total) by mouth at bedtime. 90 tablet 3  . venlafaxine (EFFEXOR) 37.5 MG tablet Take 37.5 mg by mouth daily as needed (hot flashes).    . verapamil (COVERA HS) 240 MG (CO) 24 hr tablet Take 1 tablet (240 mg total) by mouth at bedtime. 90 tablet 3   No facility-administered medications prior to visit.     ROS Review of Systems  Constitutional: Negative.  Negative for activity change, chills, diaphoresis, fatigue and fever.  HENT: Negative.  Negative for facial swelling, sinus pressure, sore throat, trouble swallowing and voice change.   Eyes: Negative.  Negative for visual disturbance.  Respiratory: Positive for cough. Negative for apnea, choking, chest tightness, shortness of breath and stridor.   Cardiovascular: Negative.  Negative for chest pain, palpitations and leg swelling.  Gastrointestinal: Negative for abdominal pain, constipation, diarrhea, nausea and vomiting.  Endocrine: Negative.   Genitourinary: Negative.  Negative for difficulty urinating and dysuria.  Musculoskeletal: Negative.  Negative for arthralgias, back pain, joint swelling, myalgias and neck pain.  Skin: Negative.  Negative for color change, pallor and rash.  Allergic/Immunologic: Negative.   Neurological: Negative.  Negative for dizziness, syncope, weakness, light-headedness and headaches.  Hematological: Negative for adenopathy. Does not bruise/bleed easily.  Psychiatric/Behavioral: Negative.     Objective:  BP 134/86 (BP Location: Left Arm, Patient Position: Sitting, Cuff Size: Normal)   Pulse 73   Temp 98.5 F (36.9 C) (Oral)   Resp 16   Ht 4\' 11"  (1.499 m)   Wt 112 lb (50.8 kg)   SpO2 99%   BMI 22.62 kg/m   BP Readings from Last 3 Encounters:  12/01/15 134/86  10/17/15 128/86  08/11/15 120/72    Wt Readings from  Last 3 Encounters:  12/01/15 112 lb (50.8 kg)  10/17/15 112 lb (50.8 kg)  08/11/15 109 lb 3.2 oz (49.5 kg)    Physical Exam  Constitutional: She is oriented to person, place, and time. She appears well-developed and well-nourished. No distress.  HENT:  Mouth/Throat: Oropharynx is clear and moist. No oropharyngeal exudate.  Eyes: Conjunctivae are normal. Right eye exhibits no discharge. Left eye exhibits no discharge. No scleral icterus.  Neck: Normal range of motion. Neck supple. No JVD present. No tracheal deviation present. No thyromegaly present.  Cardiovascular: Normal rate, regular rhythm, normal heart sounds and intact distal pulses.  Exam reveals no gallop and no friction rub.   No murmur heard. Pulmonary/Chest: Effort normal and breath sounds normal. No stridor. No respiratory distress. She has no wheezes. She has no rales. She exhibits no tenderness.  Abdominal: Soft. Bowel sounds are normal. She exhibits no distension and no mass. There is no tenderness. There is no rebound and no guarding.  Genitourinary:  Genitourinary Comments: Breast, genitourinary, and rectal exams were deferred at her request.  Musculoskeletal: Normal range of motion. She exhibits no edema, tenderness or deformity.  Lymphadenopathy:    She has no cervical adenopathy.  Neurological: She is oriented to person, place, and time.  Skin: Skin is warm and dry. No rash noted. She is not diaphoretic. No erythema. No pallor.  Vitals reviewed.   Lab Results  Component Value Date   WBC 7.6 12/01/2015   HGB 12.0 12/01/2015   HCT 35.7 (L) 12/01/2015   PLT 328.0 12/01/2015   GLUCOSE 92 12/01/2015   CHOL 186 03/23/2015   TRIG 81.0 03/23/2015   HDL 72.10 03/23/2015   LDLCALC 98 03/23/2015   ALT 26 03/23/2015   AST 28 03/23/2015   NA 143 12/01/2015   K 3.7 12/01/2015   CL 104 12/01/2015   CREATININE 1.05 12/01/2015   BUN 22 12/01/2015   CO2 32 12/01/2015   TSH 0.97 03/23/2015   INR 1.17 06/04/2013    HGBA1C 6.2 12/01/2015   MICROALBUR 11.4 (H) 03/23/2015    Dg Chest 2 View  Result Date: 10/17/2015 CLINICAL DATA:  Cough. EXAM: CHEST  2 VIEW COMPARISON:  CT 08/18/2015.  Chest x-ray 08/11/2015. FINDINGS: Mediastinum and hilar structures are normal. Stable cardiomegaly. Persistent bibasilar atelectasis and/or infiltrates. Sliding hiatal hernia. Kyphoscoliosis again noted of the thoracic spine IMPRESSION: Persistent bibasilar atelectasis and/or infiltrates. Sliding hiatal hernia. Electronically Signed   By: Maisie Fushomas  Register   On: 10/17/2015 12:28    Assessment & Plan:   Docia Furlrrie was seen today for hypertension, annual exam and diabetes.  Diagnoses and all orders for this visit:  Essential hypertension- her blood pressures adequately well-controlled, electrolytes and renal function are stable. -     CBC with Differential/Platelet; Future -     Basic metabolic panel; Future  Type 2 diabetes mellitus with complication, without long-term current use of insulin (HCC)- her A1c is 6.2%, her blood sugars are well-controlled. -     Basic metabolic panel; Future -     Hemoglobin A1c; Future -     Ambulatory referral to Ophthalmology  Routine general medical examination at a health care facility  Chronic obstructive pulmonary disease, unspecified COPD, unspecified chronic bronchitis type- I have encouraged her to use her Spiriva inhaler as directed.   I am having Ms. Murdy maintain her multivitamin, acetaminophen, vitamin C, cholecalciferol, IRON PO, venlafaxine, feeding supplement (ENSURE COMPLETE), MELATONIN PO, simvastatin, verapamil, hydrochlorothiazide, potassium chloride, glucose blood, traZODone, donepezil, loratadine, and Tiotropium Bromide-Olodaterol.  No orders of the defined types were placed in this encounter.  See AVS for instructions about healthy living and anticipatory guidance.  Follow-up: Return in about 6 months (around 06/02/2016).  Sanda Lingerhomas Darian Cansler, MD

## 2015-12-01 NOTE — Patient Instructions (Signed)
Preventive Care for Adults, Female A healthy lifestyle and preventive care can promote health and wellness. Preventive health guidelines for women include the following key practices.  A routine yearly physical is a good way to check with your health care provider about your health and preventive screening. It is a chance to share any concerns and updates on your health and to receive a thorough exam.  Visit your dentist for a routine exam and preventive care every 6 months. Brush your teeth twice a day and floss once a day. Good oral hygiene prevents tooth decay and gum disease.  The frequency of eye exams is based on your age, health, family medical history, use of contact lenses, and other factors. Follow your health care provider's recommendations for frequency of eye exams.  Eat a healthy diet. Foods like vegetables, fruits, whole grains, low-fat dairy products, and lean protein foods contain the nutrients you need without too many calories. Decrease your intake of foods high in solid fats, added sugars, and salt. Eat the right amount of calories for you.Get information about a proper diet from your health care provider, if necessary.  Regular physical exercise is one of the most important things you can do for your health. Most adults should get at least 150 minutes of moderate-intensity exercise (any activity that increases your heart rate and causes you to sweat) each week. In addition, most adults need muscle-strengthening exercises on 2 or more days a week.  Maintain a healthy weight. The body mass index (BMI) is a screening tool to identify possible weight problems. It provides an estimate of body fat based on height and weight. Your health care provider can find your BMI and can help you achieve or maintain a healthy weight.For adults 20 years and older:  A BMI below 18.5 is considered underweight.  A BMI of 18.5 to 24.9 is normal.  A BMI of 25 to 29.9 is considered overweight.  A  BMI of 30 and above is considered obese.  Maintain normal blood lipids and cholesterol levels by exercising and minimizing your intake of saturated fat. Eat a balanced diet with plenty of fruit and vegetables. Blood tests for lipids and cholesterol should begin at age 45 and be repeated every 5 years. If your lipid or cholesterol levels are high, you are over 50, or you are at high risk for heart disease, you may need your cholesterol levels checked more frequently.Ongoing high lipid and cholesterol levels should be treated with medicines if diet and exercise are not working.  If you smoke, find out from your health care provider how to quit. If you do not use tobacco, do not start.  Lung cancer screening is recommended for adults aged 45-80 years who are at high risk for developing lung cancer because of a history of smoking. A yearly low-dose CT scan of the lungs is recommended for people who have at least a 30-pack-year history of smoking and are a current smoker or have quit within the past 15 years. A pack year of smoking is smoking an average of 1 pack of cigarettes a day for 1 year (for example: 1 pack a day for 30 years or 2 packs a day for 15 years). Yearly screening should continue until the smoker has stopped smoking for at least 15 years. Yearly screening should be stopped for people who develop a health problem that would prevent them from having lung cancer treatment.  If you are pregnant, do not drink alcohol. If you are  breastfeeding, be very cautious about drinking alcohol. If you are not pregnant and choose to drink alcohol, do not have more than 1 drink per day. One drink is considered to be 12 ounces (355 mL) of beer, 5 ounces (148 mL) of wine, or 1.5 ounces (44 mL) of liquor.  Avoid use of street drugs. Do not share needles with anyone. Ask for help if you need support or instructions about stopping the use of drugs.  High blood pressure causes heart disease and increases the risk  of stroke. Your blood pressure should be checked at least every 1 to 2 years. Ongoing high blood pressure should be treated with medicines if weight loss and exercise do not work.  If you are 55-79 years old, ask your health care provider if you should take aspirin to prevent strokes.  Diabetes screening is done by taking a blood sample to check your blood glucose level after you have not eaten for a certain period of time (fasting). If you are not overweight and you do not have risk factors for diabetes, you should be screened once every 3 years starting at age 45. If you are overweight or obese and you are 40-70 years of age, you should be screened for diabetes every year as part of your cardiovascular risk assessment.  Breast cancer screening is essential preventive care for women. You should practice "breast self-awareness." This means understanding the normal appearance and feel of your breasts and may include breast self-examination. Any changes detected, no matter how small, should be reported to a health care provider. Women in their 20s and 30s should have a clinical breast exam (CBE) by a health care provider as part of a regular health exam every 1 to 3 years. After age 40, women should have a CBE every year. Starting at age 40, women should consider having a mammogram (breast X-ray test) every year. Women who have a family history of breast cancer should talk to their health care provider about genetic screening. Women at a high risk of breast cancer should talk to their health care providers about having an MRI and a mammogram every year.  Breast cancer gene (BRCA)-related cancer risk assessment is recommended for women who have family members with BRCA-related cancers. BRCA-related cancers include breast, ovarian, tubal, and peritoneal cancers. Having family members with these cancers may be associated with an increased risk for harmful changes (mutations) in the breast cancer genes BRCA1 and  BRCA2. Results of the assessment will determine the need for genetic counseling and BRCA1 and BRCA2 testing.  Your health care provider may recommend that you be screened regularly for cancer of the pelvic organs (ovaries, uterus, and vagina). This screening involves a pelvic examination, including checking for microscopic changes to the surface of your cervix (Pap test). You may be encouraged to have this screening done every 3 years, beginning at age 21.  For women ages 30-65, health care providers may recommend pelvic exams and Pap testing every 3 years, or they may recommend the Pap and pelvic exam, combined with testing for human papilloma virus (HPV), every 5 years. Some types of HPV increase your risk of cervical cancer. Testing for HPV may also be done on women of any age with unclear Pap test results.  Other health care providers may not recommend any screening for nonpregnant women who are considered low risk for pelvic cancer and who do not have symptoms. Ask your health care provider if a screening pelvic exam is right for   you.  If you have had past treatment for cervical cancer or a condition that could lead to cancer, you need Pap tests and screening for cancer for at least 20 years after your treatment. If Pap tests have been discontinued, your risk factors (such as having a new sexual partner) need to be reassessed to determine if screening should resume. Some women have medical problems that increase the chance of getting cervical cancer. In these cases, your health care provider may recommend more frequent screening and Pap tests.  Colorectal cancer can be detected and often prevented. Most routine colorectal cancer screening begins at the age of 50 years and continues through age 75 years. However, your health care provider may recommend screening at an earlier age if you have risk factors for colon cancer. On a yearly basis, your health care provider may provide home test kits to check  for hidden blood in the stool. Use of a small camera at the end of a tube, to directly examine the colon (sigmoidoscopy or colonoscopy), can detect the earliest forms of colorectal cancer. Talk to your health care provider about this at age 50, when routine screening begins. Direct exam of the colon should be repeated every 5-10 years through age 75 years, unless early forms of precancerous polyps or small growths are found.  People who are at an increased risk for hepatitis B should be screened for this virus. You are considered at high risk for hepatitis B if:  You were born in a country where hepatitis B occurs often. Talk with your health care provider about which countries are considered high risk.  Your parents were born in a high-risk country and you have not received a shot to protect against hepatitis B (hepatitis B vaccine).  You have HIV or AIDS.  You use needles to inject street drugs.  You live with, or have sex with, someone who has hepatitis B.  You get hemodialysis treatment.  You take certain medicines for conditions like cancer, organ transplantation, and autoimmune conditions.  Hepatitis C blood testing is recommended for all people born from 1945 through 1965 and any individual with known risks for hepatitis C.  Practice safe sex. Use condoms and avoid high-risk sexual practices to reduce the spread of sexually transmitted infections (STIs). STIs include gonorrhea, chlamydia, syphilis, trichomonas, herpes, HPV, and human immunodeficiency virus (HIV). Herpes, HIV, and HPV are viral illnesses that have no cure. They can result in disability, cancer, and death.  You should be screened for sexually transmitted illnesses (STIs) including gonorrhea and chlamydia if:  You are sexually active and are younger than 24 years.  You are older than 24 years and your health care provider tells you that you are at risk for this type of infection.  Your sexual activity has changed  since you were last screened and you are at an increased risk for chlamydia or gonorrhea. Ask your health care provider if you are at risk.  If you are at risk of being infected with HIV, it is recommended that you take a prescription medicine daily to prevent HIV infection. This is called preexposure prophylaxis (PrEP). You are considered at risk if:  You are sexually active and do not regularly use condoms or know the HIV status of your partner(s).  You take drugs by injection.  You are sexually active with a partner who has HIV.  Talk with your health care provider about whether you are at high risk of being infected with HIV. If   you choose to begin PrEP, you should first be tested for HIV. You should then be tested every 3 months for as long as you are taking PrEP.  Osteoporosis is a disease in which the bones lose minerals and strength with aging. This can result in serious bone fractures or breaks. The risk of osteoporosis can be identified using a bone density scan. Women ages 67 years and over and women at risk for fractures or osteoporosis should discuss screening with their health care providers. Ask your health care provider whether you should take a calcium supplement or vitamin D to reduce the rate of osteoporosis.  Menopause can be associated with physical symptoms and risks. Hormone replacement therapy is available to decrease symptoms and risks. You should talk to your health care provider about whether hormone replacement therapy is right for you.  Use sunscreen. Apply sunscreen liberally and repeatedly throughout the day. You should seek shade when your shadow is shorter than you. Protect yourself by wearing long sleeves, pants, a wide-brimmed hat, and sunglasses year round, whenever you are outdoors.  Once a month, do a whole body skin exam, using a mirror to look at the skin on your back. Tell your health care provider of new moles, moles that have irregular borders, moles that  are larger than a pencil eraser, or moles that have changed in shape or color.  Stay current with required vaccines (immunizations).  Influenza vaccine. All adults should be immunized every year.  Tetanus, diphtheria, and acellular pertussis (Td, Tdap) vaccine. Pregnant women should receive 1 dose of Tdap vaccine during each pregnancy. The dose should be obtained regardless of the length of time since the last dose. Immunization is preferred during the 27th-36th week of gestation. An adult who has not previously received Tdap or who does not know her vaccine status should receive 1 dose of Tdap. This initial dose should be followed by tetanus and diphtheria toxoids (Td) booster doses every 10 years. Adults with an unknown or incomplete history of completing a 3-dose immunization series with Td-containing vaccines should begin or complete a primary immunization series including a Tdap dose. Adults should receive a Td booster every 10 years.  Varicella vaccine. An adult without evidence of immunity to varicella should receive 2 doses or a second dose if she has previously received 1 dose. Pregnant females who do not have evidence of immunity should receive the first dose after pregnancy. This first dose should be obtained before leaving the health care facility. The second dose should be obtained 4-8 weeks after the first dose.  Human papillomavirus (HPV) vaccine. Females aged 13-26 years who have not received the vaccine previously should obtain the 3-dose series. The vaccine is not recommended for use in pregnant females. However, pregnancy testing is not needed before receiving a dose. If a female is found to be pregnant after receiving a dose, no treatment is needed. In that case, the remaining doses should be delayed until after the pregnancy. Immunization is recommended for any person with an immunocompromised condition through the age of 61 years if she did not get any or all doses earlier. During the  3-dose series, the second dose should be obtained 4-8 weeks after the first dose. The third dose should be obtained 24 weeks after the first dose and 16 weeks after the second dose.  Zoster vaccine. One dose is recommended for adults aged 30 years or older unless certain conditions are present.  Measles, mumps, and rubella (MMR) vaccine. Adults born  before 1957 generally are considered immune to measles and mumps. Adults born in 1957 or later should have 1 or more doses of MMR vaccine unless there is a contraindication to the vaccine or there is laboratory evidence of immunity to each of the three diseases. A routine second dose of MMR vaccine should be obtained at least 28 days after the first dose for students attending postsecondary schools, health care workers, or international travelers. People who received inactivated measles vaccine or an unknown type of measles vaccine during 1963-1967 should receive 2 doses of MMR vaccine. People who received inactivated mumps vaccine or an unknown type of mumps vaccine before 1979 and are at high risk for mumps infection should consider immunization with 2 doses of MMR vaccine. For females of childbearing age, rubella immunity should be determined. If there is no evidence of immunity, females who are not pregnant should be vaccinated. If there is no evidence of immunity, females who are pregnant should delay immunization until after pregnancy. Unvaccinated health care workers born before 1957 who lack laboratory evidence of measles, mumps, or rubella immunity or laboratory confirmation of disease should consider measles and mumps immunization with 2 doses of MMR vaccine or rubella immunization with 1 dose of MMR vaccine.  Pneumococcal 13-valent conjugate (PCV13) vaccine. When indicated, a person who is uncertain of his immunization history and has no record of immunization should receive the PCV13 vaccine. All adults 65 years of age and older should receive this  vaccine. An adult aged 19 years or older who has certain medical conditions and has not been previously immunized should receive 1 dose of PCV13 vaccine. This PCV13 should be followed with a dose of pneumococcal polysaccharide (PPSV23) vaccine. Adults who are at high risk for pneumococcal disease should obtain the PPSV23 vaccine at least 8 weeks after the dose of PCV13 vaccine. Adults older than 74 years of age who have normal immune system function should obtain the PPSV23 vaccine dose at least 1 year after the dose of PCV13 vaccine.  Pneumococcal polysaccharide (PPSV23) vaccine. When PCV13 is also indicated, PCV13 should be obtained first. All adults aged 65 years and older should be immunized. An adult younger than age 65 years who has certain medical conditions should be immunized. Any person who resides in a nursing home or long-term care facility should be immunized. An adult smoker should be immunized. People with an immunocompromised condition and certain other conditions should receive both PCV13 and PPSV23 vaccines. People with human immunodeficiency virus (HIV) infection should be immunized as soon as possible after diagnosis. Immunization during chemotherapy or radiation therapy should be avoided. Routine use of PPSV23 vaccine is not recommended for American Indians, [place] Natives, or people younger than 65 years unless there are medical conditions that require PPSV23 vaccine. When indicated, people who have unknown immunization and have no record of immunization should receive PPSV23 vaccine. One-time revaccination 5 years after the first dose of PPSV23 is recommended for people aged 19-64 years who have chronic kidney failure, nephrotic syndrome, asplenia, or immunocompromised conditions. People who received 1-2 doses of PPSV23 before age 65 years should receive another dose of PPSV23 vaccine at age 65 years or later if at least 5 years have passed since the previous dose. Doses of PPSV23 are not  needed for people immunized with PPSV23 at or after age 65 years.  Meningococcal vaccine. Adults with asplenia or persistent complement component deficiencies should receive 2 doses of quadrivalent meningococcal conjugate (MenACWY-D) vaccine. The doses should be obtained   at least 2 months apart. Microbiologists working with certain meningococcal bacteria, [place] recruits, people at risk during an outbreak, and people who travel to or live in countries with a high rate of meningitis should be immunized. A first-year college student up through age 34 years who is living in a residence hall should receive a dose if she did not receive a dose on or after her 16th birthday. Adults who have certain high-risk conditions should receive one or more doses of vaccine.  Hepatitis A vaccine. Adults who wish to be protected from this disease, have certain high-risk conditions, work with hepatitis A-infected animals, work in hepatitis A research labs, or travel to or work in countries with a high rate of hepatitis A should be immunized. Adults who were previously unvaccinated and who anticipate close contact with an international adoptee during the first 60 days after arrival in the [place] from a country with a high rate of hepatitis A should be immunized.  Hepatitis B vaccine. Adults who wish to be protected from this disease, have certain high-risk conditions, may be exposed to blood or other infectious body fluids, are household contacts or sex partners of hepatitis B positive people, are clients or workers in certain care facilities, or travel to or work in countries with a high rate of hepatitis B should be immunized.  Haemophilus influenzae type b (Hib) vaccine. A previously unvaccinated person with asplenia or sickle cell disease or having a scheduled splenectomy should receive 1 dose of Hib vaccine. Regardless of previous immunization, a recipient of a hematopoietic stem cell transplant should receive a  3-dose series 6-12 months after her successful transplant. Hib vaccine is not recommended for adults with HIV infection. Preventive Services / Frequency Ages 35 to 4 years  Blood pressure check.** / Every 3-5 years.  Lipid and cholesterol check.** / Every 5 years beginning at age 60.  Clinical breast exam.** / Every 3 years for women in their 71s and 10s.  BRCA-related cancer risk assessment.** / For women who have family members with a BRCA-related cancer (breast, ovarian, tubal, or peritoneal cancers).  Pap test.** / Every 2 years from ages 76 through 26. Every 3 years starting at age 61 through age 76 or 93 with a history of 3 consecutive normal Pap tests.  HPV screening.** / Every 3 years from ages 37 through ages 60 to 51 with a history of 3 consecutive normal Pap tests.  Hepatitis C blood test.** / For any individual with known risks for hepatitis C.  Skin self-exam. / Monthly.  Influenza vaccine. / Every year.  Tetanus, diphtheria, and acellular pertussis (Tdap, Td) vaccine.** / Consult your health care provider. Pregnant women should receive 1 dose of Tdap vaccine during each pregnancy. 1 dose of Td every 10 years.  Varicella vaccine.** / Consult your health care provider. Pregnant females who do not have evidence of immunity should receive the first dose after pregnancy.  HPV vaccine. / 3 doses over 6 months, if 93 and younger. The vaccine is not recommended for use in pregnant females. However, pregnancy testing is not needed before receiving a dose.  Measles, mumps, rubella (MMR) vaccine.** / You need at least 1 dose of MMR if you were born in 1957 or later. You may also need a 2nd dose. For females of childbearing age, rubella immunity should be determined. If there is no evidence of immunity, females who are not pregnant should be vaccinated. If there is no evidence of immunity, females who are  pregnant should delay immunization until after pregnancy.  Pneumococcal  13-valent conjugate (PCV13) vaccine.** / Consult your health care provider.  Pneumococcal polysaccharide (PPSV23) vaccine.** / 1 to 2 doses if you smoke cigarettes or if you have certain conditions.  Meningococcal vaccine.** / 1 dose if you are age 68 to 8 years and a Market researcher living in a residence hall, or have one of several medical conditions, you need to get vaccinated against meningococcal disease. You may also need additional booster doses.  Hepatitis A vaccine.** / Consult your health care provider.  Hepatitis B vaccine.** / Consult your health care provider.  Haemophilus influenzae type b (Hib) vaccine.** / Consult your health care provider. Ages 7 to 53 years  Blood pressure check.** / Every year.  Lipid and cholesterol check.** / Every 5 years beginning at age 25 years.  Lung cancer screening. / Every year if you are aged 11-80 years and have a 30-pack-year history of smoking and currently smoke or have quit within the past 15 years. Yearly screening is stopped once you have quit smoking for at least 15 years or develop a health problem that would prevent you from having lung cancer treatment.  Clinical breast exam.** / Every year after age 48 years.  BRCA-related cancer risk assessment.** / For women who have family members with a BRCA-related cancer (breast, ovarian, tubal, or peritoneal cancers).  Mammogram.** / Every year beginning at age 41 years and continuing for as long as you are in good health. Consult with your health care provider.  Pap test.** / Every 3 years starting at age 65 years through age 37 or 70 years with a history of 3 consecutive normal Pap tests.  HPV screening.** / Every 3 years from ages 72 years through ages 60 to 40 years with a history of 3 consecutive normal Pap tests.  Fecal occult blood test (FOBT) of stool. / Every year beginning at age 21 years and continuing until age 5 years. You may not need to do this test if you get  a colonoscopy every 10 years.  Flexible sigmoidoscopy or colonoscopy.** / Every 5 years for a flexible sigmoidoscopy or every 10 years for a colonoscopy beginning at age 35 years and continuing until age 48 years.  Hepatitis C blood test.** / For all people born from [birth year] through 1965 and any individual with known risks for hepatitis C.  Skin self-exam. / Monthly.  Influenza vaccine. / Every year.  Tetanus, diphtheria, and acellular pertussis (Tdap/Td) vaccine.** / Consult your health care provider. Pregnant women should receive 1 dose of Tdap vaccine during each pregnancy. 1 dose of Td every 10 years.  Varicella vaccine.** / Consult your health care provider. Pregnant females who do not have evidence of immunity should receive the first dose after pregnancy.  Zoster vaccine.** / 1 dose for adults aged 30 years or older.  Measles, mumps, rubella (MMR) vaccine.** / You need at least 1 dose of MMR if you were born in 1957 or later. You may also need a second dose. For females of childbearing age, rubella immunity should be determined. If there is no evidence of immunity, females who are not pregnant should be vaccinated. If there is no evidence of immunity, females who are pregnant should delay immunization until after pregnancy.  Pneumococcal 13-valent conjugate (PCV13) vaccine.** / Consult your health care provider.  Pneumococcal polysaccharide (PPSV23) vaccine.** / 1 to 2 doses if you smoke cigarettes or if you have certain conditions.  Meningococcal vaccine.** /  Consult your health care provider.  Hepatitis A vaccine.** / Consult your health care provider.  Hepatitis B vaccine.** / Consult your health care provider.  Haemophilus influenzae type b (Hib) vaccine.** / Consult your health care provider. Ages 64 years and over  Blood pressure check.** / Every year.  Lipid and cholesterol check.** / Every 5 years beginning at age 23 years.  Lung cancer screening. / Every year if you  are aged 16-80 years and have a 30-pack-year history of smoking and currently smoke or have quit within the past 15 years. Yearly screening is stopped once you have quit smoking for at least 15 years or develop a health problem that would prevent you from having lung cancer treatment.  Clinical breast exam.** / Every year after age 74 years.  BRCA-related cancer risk assessment.** / For women who have family members with a BRCA-related cancer (breast, ovarian, tubal, or peritoneal cancers).  Mammogram.** / Every year beginning at age 44 years and continuing for as long as you are in good health. Consult with your health care provider.  Pap test.** / Every 3 years starting at age 58 years through age 22 or 39 years with 3 consecutive normal Pap tests. Testing can be stopped between 65 and 70 years with 3 consecutive normal Pap tests and no abnormal Pap or HPV tests in the past 10 years.  HPV screening.** / Every 3 years from ages 64 years through ages 70 or 61 years with a history of 3 consecutive normal Pap tests. Testing can be stopped between 65 and 70 years with 3 consecutive normal Pap tests and no abnormal Pap or HPV tests in the past 10 years.  Fecal occult blood test (FOBT) of stool. / Every year beginning at age 40 years and continuing until age 27 years. You may not need to do this test if you get a colonoscopy every 10 years.  Flexible sigmoidoscopy or colonoscopy.** / Every 5 years for a flexible sigmoidoscopy or every 10 years for a colonoscopy beginning at age 7 years and continuing until age 32 years.  Hepatitis C blood test.** / For all people born from [birth year] through 1964 and any individual with known risks for hepatitis C.  Osteoporosis screening.** / A one-time screening for women ages 30 years and over and women at risk for fractures or osteoporosis.  Skin self-exam. / Monthly.  Influenza vaccine. / Every year.  Tetanus, diphtheria, and acellular pertussis (Tdap/Td)  vaccine.** / 1 dose of Td every 10 years.  Varicella vaccine.** / Consult your health care provider.  Zoster vaccine.** / 1 dose for adults aged 35 years or older.  Pneumococcal 13-valent conjugate (PCV13) vaccine.** / Consult your health care provider.  Pneumococcal polysaccharide (PPSV23) vaccine.** / 1 dose for all adults aged 46 years and older.  Meningococcal vaccine.** / Consult your health care provider.  Hepatitis A vaccine.** / Consult your health care provider.  Hepatitis B vaccine.** / Consult your health care provider.  Haemophilus influenzae type b (Hib) vaccine.** / Consult your health care provider. ** Family history and personal history of risk and conditions may change your health care provider's recommendations.   This information is not intended to replace advice given to you by your health care provider. Make sure you discuss any questions you have with your health care provider.   Document Released: 05/29/2001 Document Revised: 04/23/2014 Document Reviewed: 08/28/2010 Elsevier Interactive Patient Education [employer].

## 2015-12-01 NOTE — Progress Notes (Signed)
Pre visit review using our clinic review tool, if applicable. No additional management support is needed unless otherwise documented below in the visit note. 

## 2015-12-04 NOTE — Assessment & Plan Note (Signed)

## 2016-01-24 ENCOUNTER — Encounter: Payer: Self-pay | Admitting: Internal Medicine

## 2016-02-01 ENCOUNTER — Telehealth: Payer: Self-pay | Admitting: Internal Medicine

## 2016-02-01 NOTE — Telephone Encounter (Signed)
Pt's SIL Benjie KarvonenAlice Hamilton called me regarding the ophthalmology referral. She states pt seen the eye doctor back in March and asked them to send Dr. Yetta BarreJones the information. I don't see it.  Anyway, she proceeded to tell me pt isn't doing nothing, just staying in bed. She seemed very concerned about her and wanted to talk with you about it.  Can you please give Fulton Molelice a call. She is on her DPR. Her number is [phone].

## 2016-02-02 NOTE — Telephone Encounter (Signed)
[employer] CompanyCalled Sheila Cisneros. She stated that the pt is not able to keep track of her meds, bathing and most ADL's.  I have set an appt for pt to be evaluated on Tuesday 02/07/16 at 10:30 to evaluate [facility] status and [facility] other [facility].   Forwarding to PCP as FYI.

## 2016-02-05 ENCOUNTER — Encounter (HOSPITAL_COMMUNITY): Payer: Self-pay | Admitting: *Deleted

## 2016-02-05 ENCOUNTER — Emergency Department (HOSPITAL_COMMUNITY): Payer: Medicare Other

## 2016-02-05 ENCOUNTER — Inpatient Hospital Stay (HOSPITAL_COMMUNITY)
Admission: EM | Admit: 2016-02-05 | Discharge: 2016-02-09 | DRG: 190 | Disposition: A | Discharge status: Skilled Nursing Facility | Payer: Medicare Other | Attending: Family Medicine | Admitting: Family Medicine

## 2016-02-05 DIAGNOSIS — Z9851 Tubal ligation status: Secondary | ICD-10-CM

## 2016-02-05 DIAGNOSIS — Z888 Allergy status to other drugs, medicaments and biological substances status: Secondary | ICD-10-CM | POA: Diagnosis not present

## 2016-02-05 DIAGNOSIS — F0150 Vascular dementia without behavioral disturbance: Secondary | ICD-10-CM | POA: Diagnosis present

## 2016-02-05 DIAGNOSIS — E119 Type 2 diabetes mellitus without complications: Secondary | ICD-10-CM | POA: Diagnosis not present

## 2016-02-05 DIAGNOSIS — G309 Alzheimer's disease, unspecified: Secondary | ICD-10-CM | POA: Diagnosis not present

## 2016-02-05 DIAGNOSIS — Z9049 Acquired absence of other specified parts of digestive tract: Secondary | ICD-10-CM | POA: Diagnosis not present

## 2016-02-05 DIAGNOSIS — F419 Anxiety disorder, unspecified: Secondary | ICD-10-CM | POA: Diagnosis present

## 2016-02-05 DIAGNOSIS — Z79899 Other long term (current) drug therapy: Secondary | ICD-10-CM | POA: Diagnosis not present

## 2016-02-05 DIAGNOSIS — E876 Hypokalemia: Secondary | ICD-10-CM | POA: Diagnosis present

## 2016-02-05 DIAGNOSIS — M6281 Muscle weakness (generalized): Secondary | ICD-10-CM | POA: Diagnosis not present

## 2016-02-05 DIAGNOSIS — Z90710 Acquired absence of both cervix and uterus: Secondary | ICD-10-CM | POA: Diagnosis not present

## 2016-02-05 DIAGNOSIS — M542 Cervicalgia: Secondary | ICD-10-CM | POA: Diagnosis not present

## 2016-02-05 DIAGNOSIS — J189 Pneumonia, unspecified organism: Secondary | ICD-10-CM | POA: Diagnosis not present

## 2016-02-05 DIAGNOSIS — J9601 Acute respiratory failure with hypoxia: Secondary | ICD-10-CM | POA: Diagnosis present

## 2016-02-05 DIAGNOSIS — I10 Essential (primary) hypertension: Secondary | ICD-10-CM | POA: Diagnosis not present

## 2016-02-05 DIAGNOSIS — M1990 Unspecified osteoarthritis, unspecified site: Secondary | ICD-10-CM | POA: Diagnosis present

## 2016-02-05 DIAGNOSIS — M545 Low back pain: Secondary | ICD-10-CM | POA: Diagnosis present

## 2016-02-05 DIAGNOSIS — A419 Sepsis, unspecified organism: Secondary | ICD-10-CM | POA: Diagnosis not present

## 2016-02-05 DIAGNOSIS — R1312 Dysphagia, oropharyngeal phase: Secondary | ICD-10-CM

## 2016-02-05 DIAGNOSIS — E785 Hyperlipidemia, unspecified: Secondary | ICD-10-CM | POA: Diagnosis present

## 2016-02-05 DIAGNOSIS — R1310 Dysphagia, unspecified: Secondary | ICD-10-CM | POA: Diagnosis not present

## 2016-02-05 DIAGNOSIS — F0390 Unspecified dementia without behavioral disturbance: Secondary | ICD-10-CM | POA: Diagnosis present

## 2016-02-05 DIAGNOSIS — E118 Type 2 diabetes mellitus with unspecified complications: Secondary | ICD-10-CM | POA: Diagnosis present

## 2016-02-05 DIAGNOSIS — Z886 Allergy status to analgesic agent status: Secondary | ICD-10-CM

## 2016-02-05 DIAGNOSIS — M47812 Spondylosis without myelopathy or radiculopathy, cervical region: Secondary | ICD-10-CM | POA: Diagnosis not present

## 2016-02-05 DIAGNOSIS — Y95 Nosocomial condition: Secondary | ICD-10-CM | POA: Diagnosis present

## 2016-02-05 DIAGNOSIS — S199XXA Unspecified injury of neck, initial encounter: Secondary | ICD-10-CM | POA: Diagnosis not present

## 2016-02-05 DIAGNOSIS — K219 Gastro-esophageal reflux disease without esophagitis: Secondary | ICD-10-CM | POA: Diagnosis present

## 2016-02-05 DIAGNOSIS — M7989 Other specified soft tissue disorders: Secondary | ICD-10-CM | POA: Diagnosis not present

## 2016-02-05 DIAGNOSIS — J449 Chronic obstructive pulmonary disease, unspecified: Secondary | ICD-10-CM | POA: Diagnosis present

## 2016-02-05 DIAGNOSIS — F409 Phobic anxiety disorder, unspecified: Secondary | ICD-10-CM | POA: Diagnosis present

## 2016-02-05 DIAGNOSIS — L03114 Cellulitis of left upper limb: Secondary | ICD-10-CM | POA: Diagnosis not present

## 2016-02-05 DIAGNOSIS — M109 Gout, unspecified: Secondary | ICD-10-CM | POA: Diagnosis present

## 2016-02-05 DIAGNOSIS — G9341 Metabolic encephalopathy: Secondary | ICD-10-CM | POA: Diagnosis not present

## 2016-02-05 DIAGNOSIS — Z8249 Family history of ischemic heart disease and other diseases of the circulatory system: Secondary | ICD-10-CM | POA: Diagnosis not present

## 2016-02-05 DIAGNOSIS — J440 Chronic obstructive pulmonary disease with acute lower respiratory infection: Secondary | ICD-10-CM | POA: Diagnosis not present

## 2016-02-05 DIAGNOSIS — K449 Diaphragmatic hernia without obstruction or gangrene: Secondary | ICD-10-CM | POA: Diagnosis not present

## 2016-02-05 DIAGNOSIS — Z8701 Personal history of pneumonia (recurrent): Secondary | ICD-10-CM

## 2016-02-05 DIAGNOSIS — R531 Weakness: Secondary | ICD-10-CM | POA: Diagnosis not present

## 2016-02-05 DIAGNOSIS — R0989 Other specified symptoms and signs involving the circulatory and respiratory systems: Secondary | ICD-10-CM | POA: Diagnosis not present

## 2016-02-05 DIAGNOSIS — S0990XA Unspecified injury of head, initial encounter: Secondary | ICD-10-CM | POA: Diagnosis not present

## 2016-02-05 DIAGNOSIS — M5030 Other cervical disc degeneration, unspecified cervical region: Secondary | ICD-10-CM | POA: Diagnosis not present

## 2016-02-05 DIAGNOSIS — G8929 Other chronic pain: Secondary | ICD-10-CM | POA: Diagnosis not present

## 2016-02-05 DIAGNOSIS — Z880 Allergy status to penicillin: Secondary | ICD-10-CM | POA: Diagnosis not present

## 2016-02-05 DIAGNOSIS — F5105 Insomnia due to other mental disorder: Secondary | ICD-10-CM | POA: Diagnosis present

## 2016-02-05 DIAGNOSIS — J80 Acute respiratory distress syndrome: Secondary | ICD-10-CM | POA: Diagnosis not present

## 2016-02-05 DIAGNOSIS — J42 Unspecified chronic bronchitis: Secondary | ICD-10-CM | POA: Diagnosis not present

## 2016-02-05 LAB — BASIC METABOLIC PANEL
Anion gap: 10 (ref 5–15)
BUN: 20 mg/dL (ref 6–20)
CALCIUM: 9.1 mg/dL (ref 8.9–10.3)
CHLORIDE: 108 mmol/L (ref 101–111)
CO2: 27 mmol/L (ref 22–32)
CREATININE: 1.15 mg/dL — AB (ref 0.44–1.00)
GFR calc non Af Amer: 46 mL/min — ABNORMAL LOW (ref >60)
GFR, EST AFRICAN AMERICAN: 53 mL/min — AB (ref >60)
Glucose, Bld: 265 mg/dL — ABNORMAL HIGH (ref 65–99)
Potassium: 2.7 mmol/L — CL (ref 3.5–5.1)
SODIUM: 145 mmol/L (ref 135–145)

## 2016-02-05 LAB — URINALYSIS, ROUTINE W REFLEX MICROSCOPIC
Bilirubin Urine: NEGATIVE
Glucose, UA: >1000 mg/dL — AB
Hgb urine dipstick: NEGATIVE
Ketones, ur: NEGATIVE mg/dL
LEUKOCYTES UA: NEGATIVE
Nitrite: NEGATIVE
PROTEIN: 100 mg/dL — AB
Specific Gravity, Urine: 1.029 (ref 1.005–1.030)
pH: 6.5 (ref 5.0–8.0)

## 2016-02-05 LAB — HEPATIC FUNCTION PANEL
ALT: 16 U/L (ref 14–54)
AST: 25 U/L (ref 15–41)
Albumin: 3.1 g/dL — ABNORMAL LOW (ref 3.5–5.0)
Alkaline Phosphatase: 119 U/L (ref 38–126)
Bilirubin, Direct: <0.1 mg/dL — ABNORMAL LOW (ref 0.1–0.5)
TOTAL PROTEIN: 7.7 g/dL (ref 6.5–8.1)
Total Bilirubin: 0.5 mg/dL (ref 0.3–1.2)

## 2016-02-05 LAB — URINE MICROSCOPIC-ADD ON
Bacteria, UA: NONE SEEN
RBC / HPF: NONE SEEN RBC/hpf (ref 0–5)

## 2016-02-05 LAB — CBC
HCT: 36.8 % (ref 36.0–46.0)
Hemoglobin: 12.2 g/dL (ref 12.0–15.0)
MCH: 28.9 pg (ref 26.0–34.0)
MCHC: 33.2 g/dL (ref 30.0–36.0)
MCV: 87.2 fL (ref 78.0–100.0)
PLATELETS: 265 10*3/uL (ref 150–400)
RBC: 4.22 MIL/uL (ref 3.87–5.11)
RDW: 14 % (ref 11.5–15.5)
WBC: 14.4 10*3/uL — AB (ref 4.0–10.5)

## 2016-02-05 LAB — I-STAT CG4 LACTIC ACID, ED: LACTIC ACID, VENOUS: 2.25 mmol/L — AB (ref 0.5–1.9)

## 2016-02-05 LAB — CBG MONITORING, ED
GLUCOSE-CAPILLARY: 231 mg/dL — AB (ref 65–99)
GLUCOSE-CAPILLARY: 192 mg/dL — AB (ref 65–99)

## 2016-02-05 MED ORDER — VENLAFAXINE HCL 37.5 MG PO TABS
37.5000 mg | ORAL_TABLET | Freq: Every day | ORAL | Status: DC | PRN
  Filled 2016-02-05: qty 1

## 2016-02-05 MED ORDER — LORATADINE 10 MG PO TABS
10.0000 mg | ORAL_TABLET | Freq: Every day | ORAL | Status: DC
  Administered 2016-02-06 – 2016-02-07 (×2): 10 mg via ORAL
  Filled 2016-02-05 (×2): qty 1

## 2016-02-05 MED ORDER — ENOXAPARIN SODIUM 40 MG/0.4ML ~~LOC~~ SOLN
40.0000 mg | Freq: Every day | SUBCUTANEOUS | Status: DC
  Administered 2016-02-06 – 2016-02-08 (×4): 40 mg via SUBCUTANEOUS
  Filled 2016-02-05 (×4): qty 0.4

## 2016-02-05 MED ORDER — VANCOMYCIN HCL IN DEXTROSE 750-5 MG/150ML-% IV SOLN
750.0000 mg | INTRAVENOUS | Status: DC
Start: 2016-02-06 — End: 2016-02-07
  Administered 2016-02-07: 750 mg via INTRAVENOUS
  Filled 2016-02-05 (×2): qty 150

## 2016-02-05 MED ORDER — MULTIVITAMINS PO CAPS: 1.0000 | ORAL_CAPSULE | Freq: Every day | ORAL | Status: DC

## 2016-02-05 MED ORDER — CLINDAMYCIN PHOSPHATE 600 MG/50ML IV SOLN
600.0000 mg | Freq: Three times a day (TID) | INTRAVENOUS | Status: DC
  Administered 2016-02-06: 600 mg via INTRAVENOUS
  Filled 2016-02-05 (×2): qty 50

## 2016-02-05 MED ORDER — VANCOMYCIN HCL IN DEXTROSE 1-5 GM/200ML-% IV SOLN
1000.0000 mg | Freq: Once | INTRAVENOUS | Status: AC
  Administered 2016-02-05: 1000 mg via INTRAVENOUS
  Filled 2016-02-05: qty 200

## 2016-02-05 MED ORDER — PANTOPRAZOLE SODIUM 40 MG PO TBEC
40.0000 mg | DELAYED_RELEASE_TABLET | Freq: Every day | ORAL | Status: DC
  Administered 2016-02-06 – 2016-02-07 (×3): 40 mg via ORAL
  Filled 2016-02-05 (×3): qty 1

## 2016-02-05 MED ORDER — IPRATROPIUM-ALBUTEROL 0.5-2.5 (3) MG/3ML IN SOLN
3.0000 mL | Freq: Four times a day (QID) | RESPIRATORY_TRACT | Status: DC
  Administered 2016-02-06 (×2): 3 mL via RESPIRATORY_TRACT
  Filled 2016-02-05 (×2): qty 3

## 2016-02-05 MED ORDER — KETOROLAC TROMETHAMINE 15 MG/ML IJ SOLN
15.0000 mg | Freq: Four times a day (QID) | INTRAMUSCULAR | Status: AC | PRN
  Administered 2016-02-07: 15 mg via INTRAVENOUS
  Filled 2016-02-05: qty 1

## 2016-02-05 MED ORDER — ENSURE ENLIVE PO LIQD
237.0000 mL | Freq: Two times a day (BID) | ORAL | Status: DC
  Administered 2016-02-06 – 2016-02-08 (×4): 237 mL via ORAL

## 2016-02-05 MED ORDER — ACETAMINOPHEN 325 MG PO TABS: 650.0000 mg | ORAL_TABLET | Freq: Four times a day (QID) | ORAL | Status: DC | PRN

## 2016-02-05 MED ORDER — DEXTROSE 5 % IV SOLN
2.0000 g | Freq: Once | INTRAVENOUS | Status: AC
  Administered 2016-02-05: 2 g via INTRAVENOUS
  Filled 2016-02-05: qty 2

## 2016-02-05 MED ORDER — POTASSIUM CHLORIDE ER 10 MEQ PO TBCR
20.0000 meq | EXTENDED_RELEASE_TABLET | Freq: Two times a day (BID) | ORAL | Status: DC
  Administered 2016-02-06: 20 meq via ORAL
  Filled 2016-02-05 (×2): qty 2

## 2016-02-05 MED ORDER — KETOROLAC TROMETHAMINE 15 MG/ML IJ SOLN
15.0000 mg | Freq: Once | INTRAMUSCULAR | Status: AC
  Administered 2016-02-06: 15 mg via INTRAVENOUS
  Filled 2016-02-05: qty 1

## 2016-02-05 MED ORDER — SIMVASTATIN 20 MG PO TABS
20.0000 mg | ORAL_TABLET | Freq: Every day | ORAL | Status: DC
  Administered 2016-02-06: 20 mg via ORAL
  Filled 2016-02-05: qty 1

## 2016-02-05 MED ORDER — ALBUTEROL SULFATE (2.5 MG/3ML) 0.083% IN NEBU: 2.5000 mg | INHALATION_SOLUTION | RESPIRATORY_TRACT | Status: DC | PRN

## 2016-02-05 MED ORDER — ACETAMINOPHEN 650 MG RE SUPP: 650.0000 mg | Freq: Four times a day (QID) | RECTAL | Status: DC | PRN

## 2016-02-05 MED ORDER — DEXTROSE 5 % IV SOLN
1.0000 g | Freq: Three times a day (TID) | INTRAVENOUS | Status: DC
  Administered 2016-02-06: 1 g via INTRAVENOUS
  Filled 2016-02-05 (×2): qty 1

## 2016-02-05 MED ORDER — CLINDAMYCIN PHOSPHATE 600 MG/50ML IV SOLN
600.0000 mg | Freq: Once | INTRAVENOUS | Status: AC
  Administered 2016-02-05: 600 mg via INTRAVENOUS
  Filled 2016-02-05: qty 50

## 2016-02-05 MED ORDER — VERAPAMIL HCL ER 240 MG PO TBCR
240.0000 mg | EXTENDED_RELEASE_TABLET | Freq: Every day | ORAL | Status: DC
  Administered 2016-02-06 – 2016-02-09 (×4): 240 mg via ORAL
  Filled 2016-02-05 (×4): qty 1

## 2016-02-05 MED ORDER — SODIUM CHLORIDE 0.9 % IV BOLUS (SEPSIS)
250.0000 mL | Freq: Once | INTRAVENOUS | Status: AC
  Administered 2016-02-05: 250 mL via INTRAVENOUS

## 2016-02-05 MED ORDER — POTASSIUM CHLORIDE IN NACL 40-0.9 MEQ/L-% IV SOLN
INTRAVENOUS | Status: AC
  Administered 2016-02-05: 100 mL/h via INTRAVENOUS
  Filled 2016-02-05 (×2): qty 1000

## 2016-02-05 MED ORDER — ACETAMINOPHEN 500 MG PO TABS
1000.0000 mg | ORAL_TABLET | Freq: Once | ORAL | Status: AC
  Administered 2016-02-05: 1000 mg via ORAL
  Filled 2016-02-05: qty 2

## 2016-02-05 MED ORDER — SODIUM CHLORIDE 0.9 % IV BOLUS (SEPSIS)
500.0000 mL | Freq: Once | INTRAVENOUS | Status: AC
  Administered 2016-02-05: 500 mL via INTRAVENOUS

## 2016-02-05 MED ORDER — TRAZODONE HCL 50 MG PO TABS
50.0000 mg | ORAL_TABLET | Freq: Every day | ORAL | Status: DC
  Administered 2016-02-06 (×2): 50 mg via ORAL
  Filled 2016-02-05 (×2): qty 1

## 2016-02-05 MED ORDER — ONDANSETRON HCL 4 MG/2ML IJ SOLN: 4.0000 mg | Freq: Four times a day (QID) | INTRAMUSCULAR | Status: DC | PRN

## 2016-02-05 MED ORDER — SODIUM CHLORIDE 0.9% FLUSH
3.0000 mL | Freq: Two times a day (BID) | INTRAVENOUS | Status: DC
  Administered 2016-02-06 – 2016-02-09 (×5): 3 mL via INTRAVENOUS

## 2016-02-05 MED ORDER — SODIUM CHLORIDE 0.9 % IV BOLUS (SEPSIS)
1000.0000 mL | Freq: Once | INTRAVENOUS | Status: AC
  Administered 2016-02-05: 1000 mL via INTRAVENOUS

## 2016-02-05 MED ORDER — ONDANSETRON HCL 4 MG PO TABS: 4.0000 mg | ORAL_TABLET | Freq: Four times a day (QID) | ORAL | Status: DC | PRN

## 2016-02-05 MED ORDER — POTASSIUM CHLORIDE 10 MEQ/100ML IV SOLN
10.0000 meq | INTRAVENOUS | Status: AC
  Administered 2016-02-06 (×2): 10 meq via INTRAVENOUS

## 2016-02-05 MED ORDER — INSULIN ASPART 100 UNIT/ML ~~LOC~~ SOLN
0.0000 [IU] | Freq: Three times a day (TID) | SUBCUTANEOUS | Status: DC
  Administered 2016-02-06 – 2016-02-07 (×3): 1 [IU] via SUBCUTANEOUS

## 2016-02-05 MED ORDER — DONEPEZIL HCL 10 MG PO TABS
5.0000 mg | ORAL_TABLET | Freq: Every day | ORAL | Status: DC
  Administered 2016-02-06 – 2016-02-07 (×3): 5 mg via ORAL
  Filled 2016-02-05 (×4): qty 1

## 2016-02-05 MED ORDER — ADULT MULTIVITAMIN W/MINERALS CH
1.0000 | ORAL_TABLET | Freq: Every day | ORAL | Status: DC
  Administered 2016-02-06 – 2016-02-08 (×3): 1 via ORAL
  Filled 2016-02-05 (×4): qty 1

## 2016-02-05 NOTE — Progress Notes (Signed)
Pharmacy Antibiotic Note  Sheila Cisneros is a 75 y.o. female admitted on 02/05/2016 with AMS and sepsis of unknown origin.  Pharmacy has been consulted for Vancomycin / Aztreonam dosing (penicillin allergy) + MD dosing IV clindamycin.  Pt unable to take PO currently.   Pt has tolerated cephalosporins in the past.   10/22:   First doses ordered in ED.   SCr =1.15, estimated CrCl ~33 ml/min CG / 48 N WBC and LA elevated.    Plan: Aztreonam 1g IV q8h Vancomycin 1g IV x1, then  750mg  IV q24h Clindamycin 600mg  IV q8h F/u renal function, cultures, VT at Css, clinical course    Temp (24hrs), Avg:97.5 F (36.4 C), Min:97.5 F (36.4 C), Max:97.5 F (36.4 C)   Recent Labs Lab 02/05/16 1818 02/05/16 1851  WBC 14.4*  --   CREATININE 1.15*  --   LATICACIDVEN  --  2.25*    CrCl cannot be calculated (Unknown ideal weight.).    Allergies  Allergen Reactions  . Codeine Rash     angioedema  . Neomycin Itching  . Penicillins Itching and Rash       . Tetanus Toxoids Rash  . Levofloxacin Rash    Antimicrobials this admission: 10/22 Vancomycin >>  10/22 Aztreonam >>  10/22 Clindamycin >>  Dose adjustments this admission:   Microbiology results: 10/22 BCx: Collected  Thank you for allowing pharmacy to be a part of this patient's care.  Haynes Hoehnolleen Ariyannah Pauling, PharmD, BCPS 02/05/2016, 8:01 PM  Pager: [phone]

## 2016-02-05 NOTE — ED Triage Notes (Signed)
EMS reports nephew called due to increased lethargic, Chest congestion and fever. Unable to get IV, Left wrist swollen and hot to touch.

## 2016-02-05 NOTE — ED Provider Notes (Signed)
[facility]-EMERGENCY DEPT Provider Note   CSN: [id number] Arrival date & time: 02/05/16  1804     History   Chief Complaint Chief Complaint  Patient presents with  . Fever  . Fatigue  . Chest congestion    HPI Sheila Cisneros is a 75 y.o. female.  Sheila Cisneros is a 75 year old female with a history of COPD, dementia, hyperlipidemia and hypertension who presents with altered mental status. History is limited due to her lethargy. History is obtained from her nephew. He states that patient lives at home alone. She normally has some mild confusion but is able to take care of herself at home. Ambulates without assistance. Over the last 2-3 days, she hasn't been wanting to get out of bed. She hasn't been alerted she normally is. She's been more confused than normal. There is been no known fevers. No vomiting or diarrhea. No incontinence. No known falls but she's been acting like she is in pain. They have noticed that her left wrist is been swollen for about 2 days as well. She has a history of gout but they don't know if she's had any trauma to that area.    Fever      Past Medical History:  Diagnosis Date  . Altered mental status 12/09/2012  . Arthritis    "all over" (12/09/2012)  . Chronic bronchitis (HCC)    "couple times/yr" (12/09/2012)  . Chronic lower back pain   . COPD (chronic obstructive pulmonary disease) (HCC)   . Diabetes mellitus without complication (HCC)   . Exertional shortness of breath   . GERD (gastroesophageal reflux disease)    rolaids if needed  . Gout, unspecified   . Hepatitis, unspecified    "the one that's not bad" (12/09/2012)  . History of blood transfusion    "related to a surgery, I think" (12/09/2012)  . Hyperlipidemia   . Hypertension   . Migraines    "in the past" (12/09/2012)  . Osteoarthrosis, unspecified whether generalized or localized, unspecified site   . Other chronic sinusitis   . Pneumonia    "couple times; long time ago" (12/09/2012)  . Unspecified  chronic bronchitis    sees Dr. Melba Coon, last visit 08/2011, treated for E. Coli- resp. /w Ceftin, Feb. 2013    Patient Active Problem List   Diagnosis Date Noted  . HCAP (healthcare-associated pneumonia) 02/05/2016  . Type 2 diabetes mellitus (HCC) 02/05/2016  . Bronchiectasis (HCC) 06/18/2015  . Insomnia due to anxiety and fear 06/09/2015  . Hearing loss 05/02/2015  . Mass of lower lobe of left lung 04/06/2015  . COPD (chronic obstructive pulmonary disease) (HCC) 03/23/2015  . Routine general medical examination at a health care facility 08/04/2013  . Other screening mammogram 08/04/2013  . Duodenal ulcer 08/03/2013  . Hypokalemia 08/03/2013  . Hyperlipidemia with target LDL less than 100   . Type II diabetes mellitus with manifestations (HCC) 06/21/2013  . Scoliosis (and kyphoscoliosis), idiopathic 12/12/2012  . Dementia without behavioral disturbance 12/10/2012  . GERD (gastroesophageal reflux disease) 12/09/2012  . GOUT 03/07/2007  . Essential hypertension 03/07/2007    Past Surgical History:  Procedure Laterality Date  . ABDOMINAL HYSTERECTOMY     "w/right ovary" (12/09/2012)  . APPENDECTOMY    . BREAST BIOPSY Right   . CARPAL TUNNEL RELEASE  09/06/2011   Procedure: CARPAL TUNNEL RELEASE;  Surgeon: Kennieth Rad, MD;  Location: [facility] OR;  Service: Orthopedics;  Laterality: Right;  . ESOPHAGOGASTRODUODENOSCOPY N/A 06/04/2013   Procedure: ESOPHAGOGASTRODUODENOSCOPY (EGD);  Surgeon: Graylin ShiverSalem F Ganem, MD;  [facility];  Service: Endoscopy;  Laterality: N/A;  . SHOULDER ARTHROSCOPY W/ ROTATOR CUFF REPAIR Right   . TONSILLECTOMY    . TUBAL LIGATION      OB History    No data available       Home Medications    Prior to Admission medications   Medication Sig Start Date End Date Taking? Authorizing Provider  Ascorbic Acid (VITAMIN C) 500 MG tablet Take 500 mg by mouth daily.     Yes Historical Provider, MD  cholecalciferol (VITAMIN D) 1000 UNITS tablet Take 1,000  Units by mouth daily.   Yes Historical Provider, MD  donepezil (ARICEPT) 5 MG tablet TAKE 1 TABLET (5 MG TOTAL) BY MOUTH AT BEDTIME. 07/04/15  Yes Etta Grandchildhomas L Jones, MD  feeding supplement, ENSURE COMPLETE, (ENSURE COMPLETE) LIQD Take 237 mLs by mouth 2 (two) times daily between meals. 06/23/13  Yes Catarina Hartshornavid Tat, MD  hydrochlorothiazide (MICROZIDE) 12.5 MG capsule Take 1 capsule (12.5 mg total) by mouth daily. 11/15/14  Yes Etta Grandchildhomas L Jones, MD  IRON PO Take 1 tablet by mouth daily.   Yes Historical Provider, MD  loratadine (CLARITIN) 10 MG tablet TAKE 1 TABLET (10 MG TOTAL) BY MOUTH DAILY. 09/01/15  Yes Etta Grandchildhomas L Jones, MD  MELATONIN PO Take 1 tablet by mouth daily.    Yes Historical Provider, MD  Multiple Vitamin (MULTIVITAMIN) capsule Take 1 capsule by mouth daily.     Yes Historical Provider, MD  potassium chloride (KLOR-CON) 20 MEQ packet Take 20 mEq by mouth 2 (two) times daily. 11/25/14  Yes Etta Grandchildhomas L Jones, MD  simvastatin (ZOCOR) 20 MG tablet Take 1 tablet (20 mg total) by mouth at bedtime. 11/15/14  Yes Etta Grandchildhomas L Jones, MD  Tiotropium Bromide-Olodaterol (STIOLTO RESPIMAT) 2.5-2.5 MCG/ACT AERS Inhale 2 puffs into the lungs daily. 10/17/15  Yes Etta Grandchildhomas L Jones, MD  traZODone (DESYREL) 50 MG tablet Take 1 tablet (50 mg total) by mouth at bedtime. 06/09/15  Yes Etta Grandchildhomas L Jones, MD  acetaminophen (TYLENOL) 500 MG tablet Take 500 mg by mouth every 6 (six) hours as needed. For pain    Historical Provider, MD  glucose blood test strip Use to check blood sugar once daily. 05/02/15   Etta Grandchildhomas L Jones, MD  venlafaxine (EFFEXOR) 37.5 MG tablet Take 37.5 mg by mouth daily as needed (hot flashes).    Historical Provider, MD  verapamil (COVERA HS) 240 MG (CO) 24 hr tablet Take 1 tablet (240 mg total) by mouth at bedtime. 11/15/14   Etta Grandchildhomas L Jones, MD    Family History Family History  Problem Relation Age of Onset  . Heart disease Father   . ALS Brother     Social History Social History  Substance Use Topics  . Smoking  status: Never Smoker  . Smokeless tobacco: Never Used  . Alcohol use Yes     Comment: 12/09/2012 "have a taste yearly"     Allergies   Codeine; Neomycin; Penicillins; Tetanus toxoids; and Levofloxacin   Review of Systems Review of Systems  Unable to perform ROS: Mental status change  Constitutional: Positive for fever.     Physical Exam Updated Vital Signs BP (!) 162/84 (BP Location: Left Arm)   Pulse 75   Temp 97.9 F (36.6 C) (Oral)   Resp (!) 24   Ht 5' 2.5" (1.588 m)   Wt 110 lb (49.9 kg)   SpO2 100%   BMI 19.80 kg/m   Physical Exam  Constitutional: She appears well-developed and well-nourished.  HENT:  Head: Normocephalic and atraumatic.  Dry mucous membranes  Eyes: Pupils are equal, round, and reactive to light.  Cardiovascular: Normal rate, regular rhythm and normal heart sounds.   Pulmonary/Chest: Effort normal. No respiratory distress. She has no wheezes. She has no rales. She exhibits no tenderness.  Rhonchi bilaterally  Abdominal: Soft. Bowel sounds are normal. There is no tenderness. There is no rebound and no guarding.  Musculoskeletal: Normal range of motion. She exhibits no edema.  Patient has some pain and swelling to her left wrist. It's warm and tender to palpation. No wounds are noted. No induration or fluctuance. There is no other significant pain on range of motion or palpation of the extremities, including the hips. There is some tenderness on palpation of the neck, to the midline and across the musculature bilaterally. I don't elicit any significant tenderness to the thoracic or lumbosacral spine.  Lymphadenopathy:    She has no cervical adenopathy.  Neurological:  Patient is sleepy but will wake up and answer some questions. She is confused and is oriented only to person and place. She moves all extremity symmetrically.  Skin: Skin is warm and dry. No rash noted.  Psychiatric: She has a normal mood and affect.     ED Treatments / Results    Labs (all labs ordered are listed, but only abnormal results are displayed) Labs Reviewed  BASIC METABOLIC PANEL - Abnormal; Notable for the following:       Result Value   Potassium 2.7 (*)    Glucose, Bld 265 (*)    Creatinine, Ser 1.15 (*)    GFR calc non Af Amer 46 (*)    GFR calc Af Amer 53 (*)    All other components within normal limits  CBC - Abnormal; Notable for the following:    WBC 14.4 (*)    All other components within normal limits  URINALYSIS, ROUTINE W REFLEX MICROSCOPIC (NOT AT [facility]) - Abnormal; Notable for the following:    Glucose, UA >1000 (*)    Protein, ur 100 (*)    All other components within normal limits  HEPATIC FUNCTION PANEL - Abnormal; Notable for the following:    Albumin 3.1 (*)    Bilirubin, Direct <0.1 (*)    All other components within normal limits  URINE MICROSCOPIC-ADD ON - Abnormal; Notable for the following:    Squamous Epithelial / LPF 0-5 (*)    All other components within normal limits  CBG MONITORING, ED - Abnormal; Notable for the following:    Glucose-Capillary 231 (*)    All other components within normal limits  I-STAT CG4 LACTIC ACID, ED - Abnormal; Notable for the following:    Lactic Acid, Venous 2.25 (*)    All other components within normal limits  CBG MONITORING, ED - Abnormal; Notable for the following:    Glucose-Capillary 192 (*)    All other components within normal limits  CULTURE, BLOOD (ROUTINE X 2)  CULTURE, BLOOD (ROUTINE X 2)  URINE CULTURE  CULTURE, EXPECTORATED SPUTUM-ASSESSMENT  GRAM STAIN  LACTIC ACID, PLASMA  LACTIC ACID, PLASMA  MAGNESIUM  PHOSPHORUS  STREP PNEUMONIAE URINARY ANTIGEN  COMPREHENSIVE METABOLIC PANEL  CBC WITH DIFFERENTIAL/PLATELET  I-STAT CG4 LACTIC ACID, ED    EKG  EKG Interpretation None       Radiology Dg Chest 2 View  Result Date: 02/05/2016 CLINICAL DATA:  Increased lethargy, chest congestion, and fever. EXAM: CHEST  2 VIEW COMPARISON:  10/17/2015 FINDINGS: Kyphotic  angulation on the AP radiograph with the cardiac silhouette partially obscured. A hiatal hernia is again seen. Lung volumes are diminished compared to the prior study with persistent bibasilar opacities, left greater than right and mildly increased from the prior study. No sizable pleural effusion or pneumothorax is identified. Thoracolumbar scoliosis is again noted. IMPRESSION: 1. Chronic bibasilar opacities which have mildly increased from the prior study and may reflect a combination of atelectasis and chronic aspiration. Superimposed infectious pneumonia is also possible. 2. Hiatal hernia. Electronically Signed   By: Sebastian Ache M.D.   On: 02/05/2016 20:24   Dg Wrist Complete Left  Result Date: 02/05/2016 CLINICAL DATA:  Increased lethargy left wrist swollen and hot to touch EXAM: LEFT WRIST - COMPLETE 3+ VIEW COMPARISON:  None. FINDINGS: Moderate diffuse soft tissue swelling. No radiopaque foreign body. Bones appear osteopenic. No obvious fracture lucency however exam limited by osteopenia. Degenerative changes at the base of the thumb. IMPRESSION: 1. Moderate soft tissue swelling. No definite acute osseous abnormality allowing for osteopenia. 2. If soft tissue or joint infection is suspected, MRI may be obtained for further evaluation. Electronically Signed   By: Jasmine Pang M.D.   On: 02/05/2016 20:25   Ct Head Wo Contrast  Result Date: 02/05/2016 CLINICAL DATA:  Unwitnessed fall today.  Neck pain. EXAM: CT HEAD WITHOUT CONTRAST CT CERVICAL SPINE WITHOUT CONTRAST TECHNIQUE: Multidetector CT imaging of the head and cervical spine was performed following the standard protocol without intravenous contrast. Multiplanar CT image reconstructions of the cervical spine were also generated. COMPARISON:  06/21/2013 and 12/10/2012 FINDINGS: CT HEAD FINDINGS Brain: 6 mm hypodense lesion in the right anterior thalamus compatible with remote lacunar infarct. Suspected small remote lacunar infarcts in the  lentiform nuclei and involving the caudate heads. Periventricular white matter and corona radiata hypodensities favor chronic ischemic microvascular white matter disease. Otherwise, The brainstem, cerebellum, cerebral peduncles, thalami, basal ganglia, basilar cisterns, and ventricular system appear within normal limits. No intracranial hemorrhage, mass lesion, or acute CVA. Vascular: Unremarkable Skull: Unremarkable Sinuses/Orbits: Mild chronic right maxillary sinusitis. Partial left ethmoidectomy with left maxillary antrostomy. Small osteoma along the left ethmoid air cells. Chronic ethmoid and bilateral frontal sinusitis. Other: No supplemental non-categorized findings. CT CERVICAL SPINE FINDINGS Alignment: The neck is significantly rotated during imaging and this could not be corrected. 1.5 mm anterolisthesis at C4-5 and 1.5 mm retrolisthesis at C5-6. There is 2 mm degenerative anterolisthesis at C7-T1. Skull base and vertebrae: Degenerative spurring and loss of articular space at the anterior C1-2 articulation. Small amount of pannus posterior to the odontoid. Loss of disc height and endplate sclerosis at C5-6 and C6-7. No fracture is identified. There is some mild prominence the . prevertebral soft tissues. Chronic sphenoid sinusitis. Soft tissues and spinal canal: Unremarkable Disc levels: Uncinate spurring on the left at C5-6 and C6-7, contributing to mild osseous foraminal narrowing. Upper chest: Unremarkable Other: No supplemental non-categorized findings. IMPRESSION: 1. Right the line make an basal ganglia remote lacunar infarcts without acute intracranial findings. 2. Periventricular white matter and corona radiata hypodensities favor chronic ischemic microvascular white matter disease. 3. Chronic maxillary and sphenoid sinusitis along with chronic ethmoid sinusitis. Prior left ethmoid surgery. Chronic frontal sinusitis. 4. Cervical spondylosis and degenerative disc disease without fracture or  subluxation identified. Suboptimal positioning during scanning of the cervical spine due to twisting of the neck which the patient was not able to correct. 5. Mild osseous foraminal stenosis on the left at C5-6 and C6-7  due to uncinate spurring. Electronically Signed   By: Gaylyn Rong M.D.   On: 02/05/2016 20:02   Ct Cervical Spine Wo Contrast  Result Date: 02/05/2016 CLINICAL DATA:  Unwitnessed fall today.  Neck pain. EXAM: CT HEAD WITHOUT CONTRAST CT CERVICAL SPINE WITHOUT CONTRAST TECHNIQUE: Multidetector CT imaging of the head and cervical spine was performed following the standard protocol without intravenous contrast. Multiplanar CT image reconstructions of the cervical spine were also generated. COMPARISON:  06/21/2013 and 12/10/2012 FINDINGS: CT HEAD FINDINGS Brain: 6 mm hypodense lesion in the right anterior thalamus compatible with remote lacunar infarct. Suspected small remote lacunar infarcts in the lentiform nuclei and involving the caudate heads. Periventricular white matter and corona radiata hypodensities favor chronic ischemic microvascular white matter disease. Otherwise, The brainstem, cerebellum, cerebral peduncles, thalami, basal ganglia, basilar cisterns, and ventricular system appear within normal limits. No intracranial hemorrhage, mass lesion, or acute CVA. Vascular: Unremarkable Skull: Unremarkable Sinuses/Orbits: Mild chronic right maxillary sinusitis. Partial left ethmoidectomy with left maxillary antrostomy. Small osteoma along the left ethmoid air cells. Chronic ethmoid and bilateral frontal sinusitis. Other: No supplemental non-categorized findings. CT CERVICAL SPINE FINDINGS Alignment: The neck is significantly rotated during imaging and this could not be corrected. 1.5 mm anterolisthesis at C4-5 and 1.5 mm retrolisthesis at C5-6. There is 2 mm degenerative anterolisthesis at C7-T1. Skull base and vertebrae: Degenerative spurring and loss of articular space at the anterior  C1-2 articulation. Small amount of pannus posterior to the odontoid. Loss of disc height and endplate sclerosis at C5-6 and C6-7. No fracture is identified. There is some mild prominence the . prevertebral soft tissues. Chronic sphenoid sinusitis. Soft tissues and spinal canal: Unremarkable Disc levels: Uncinate spurring on the left at C5-6 and C6-7, contributing to mild osseous foraminal narrowing. Upper chest: Unremarkable Other: No supplemental non-categorized findings. IMPRESSION: 1. Right the line make an basal ganglia remote lacunar infarcts without acute intracranial findings. 2. Periventricular white matter and corona radiata hypodensities favor chronic ischemic microvascular white matter disease. 3. Chronic maxillary and sphenoid sinusitis along with chronic ethmoid sinusitis. Prior left ethmoid surgery. Chronic frontal sinusitis. 4. Cervical spondylosis and degenerative disc disease without fracture or subluxation identified. Suboptimal positioning during scanning of the cervical spine due to twisting of the neck which the patient was not able to correct. 5. Mild osseous foraminal stenosis on the left at C5-6 and C6-7 due to uncinate spurring. Electronically Signed   By: Gaylyn Rong M.D.   On: 02/05/2016 20:02    Procedures Procedures (including critical care time)  Medications Ordered in ED Medications  potassium chloride 10 mEq in 100 mL IVPB (not administered)  aztreonam (AZACTAM) 1 g in dextrose 5 % 50 mL IVPB (not administered)  vancomycin (VANCOCIN) IVPB 750 mg/150 ml premix (not administered)  clindamycin (CLEOCIN) IVPB 600 mg (not administered)  enoxaparin (LOVENOX) injection 40 mg (not administered)  sodium chloride flush (NS) 0.9 % injection 3 mL (not administered)  0.9 % NaCl with KCl 40 mEq / L  infusion (not administered)  acetaminophen (TYLENOL) tablet 650 mg (not administered)    Or  acetaminophen (TYLENOL) suppository 650 mg (not administered)  ondansetron (ZOFRAN)  tablet 4 mg (not administered)    Or  ondansetron (ZOFRAN) injection 4 mg (not administered)  insulin aspart (novoLOG) injection 0-9 Units (not administered)  ketorolac (TORADOL) 15 MG/ML injection 15 mg (not administered)  ketorolac (TORADOL) 15 MG/ML injection 15 mg (not administered)  loratadine (CLARITIN) tablet 10 mg (not administered)  donepezil (ARICEPT) tablet  5 mg (not administered)  traZODone (DESYREL) tablet 50 mg (not administered)  potassium chloride (K-DUR) CR tablet 20 mEq (not administered)  verapamil (CALAN-SR) CR tablet 240 mg (not administered)  simvastatin (ZOCOR) tablet 20 mg (not administered)  venlafaxine (EFFEXOR) tablet 37.5 mg (not administered)  feeding supplement (ENSURE ENLIVE) (ENSURE ENLIVE) liquid 237 mL (not administered)  ipratropium-albuterol (DUONEB) 0.5-2.5 (3) MG/3ML nebulizer solution 3 mL (not administered)  albuterol (PROVENTIL) (2.5 MG/3ML) 0.083% nebulizer solution 2.5 mg (not administered)  pantoprazole (PROTONIX) EC tablet 40 mg (not administered)  multivitamin with minerals tablet 1 tablet (not administered)  sodium chloride 0.9 % bolus 1,000 mL (0 mLs Intravenous Stopped 02/05/16 2140)    And  sodium chloride 0.9 % bolus 500 mL (0 mLs Intravenous Stopped 02/05/16 2212)    And  sodium chloride 0.9 % bolus 250 mL (0 mLs Intravenous Stopped 02/05/16 2220)  clindamycin (CLEOCIN) IVPB 600 mg (0 mg Intravenous Stopped 02/05/16 2212)  aztreonam (AZACTAM) 2 g in dextrose 5 % 50 mL IVPB (0 g Intravenous Stopped 02/05/16 2128)  vancomycin (VANCOCIN) IVPB 1000 mg/200 mL premix (1,000 mg Intravenous New Bag/Given 02/05/16 2212)  acetaminophen (TYLENOL) tablet 1,000 mg (1,000 mg Oral Given 02/05/16 2212)     Initial Impression / Assessment and Plan / ED Course  I have reviewed the triage vital signs and the nursing notes.  Pertinent labs & imaging results that were available during my care of the patient were reviewed by me and considered in my  medical decision making (see chart for details).  Clinical Course    Patient presents with altered mental status and generalized weakness. She did meet surgical criteria on arrival and sepsis protocol was initiated. She has allergies to penicillin and fluoroquinolones and I did consult pharmacy who recommends starting clindamycin, Azactam and vancomycin for sepsis coverage. She has had a stable blood pressure. Her chest x-ray shows possible pneumonia which could be the etiology for the sepsis however she also has swelling and erythema to her left wrist. This could represent a cellulitis versus septic joint versus gout, she does have a history of gout. She has been covered with antibiotic therapy. She was given IV fluid boluses at 30 cc/kg. Her urinalysis does not indicate infection. It's unknown whether she's had any recent falls and she did present with neck pain so she had a CT scan of her head and cervical spine which don't show any acute abnormalities. Patient's vital signs are currently stable and I will consult the hospitalist for admission.  I spoke with Dr. Robb Matar who will admit the Sheila Cisneros.  Final Clinical Impressions(s) / ED Diagnoses   Final diagnoses:  Sepsis due to undetermined organism [facility])  Community acquired pneumonia, unspecified laterality  Cellulitis of left upper extremity    New Prescriptions Current Discharge Medication List       Rolan Bucco, MD 02/05/16 [phone]

## 2016-02-05 NOTE — ED Notes (Signed)
Sheila Cisneros from lab reports potassium 2.7.  RN and MD notified

## 2016-02-05 NOTE — ED Notes (Signed)
Bed: [id number] Expected date: 02/05/16 Expected time: 5:54 PM Means of arrival: Ambulance Comments: Ca pt  Hot to touch

## 2016-02-05 NOTE — ED Notes (Signed)
Dr Ortiz at bedside 

## 2016-02-05 NOTE — ED Notes (Signed)
Lactic Acid= 2.25 MD Belfie notified

## 2016-02-05 NOTE — H&P (Signed)
History and Physical    Sheila Cisneros ZOX:[id number] DOB: [birth year] DOA: 02/05/2016  PCP: Sanda Lingerhomas Jones, MD   Patient coming from: Home.  Chief Complaint: Lethargy, congestion and fever.  HPI: Sheila Amassrrie L Elrod is a 75 y.o. female with medical history significant of osteoarthritis, COPD, bronchiectasis, Chronic lower back pain, type 2 diabetes, GERD, gout, hypertension, hyperlipidemia, previous pneumonia episodes in the past who comes to the ED via EMS due progressively worse fatigue, congestion and fever.   Per patient's nephew Lorin PicketScott, she has been feeling fatigued for about 3 weeks. However, in the past 3 days, she has been a lot more fatigued, increased congestion, positive productive cough, no endorsement of fever, chills or night sweats at home, but the patient had a temperature of 101.24F in the emergency department. No travel history or sick contacts.   The patient also has tenderness, calor to palpation with surrounding erythema and edema. No known history of trauma to this area.   ED Course: WBC was 14.4, potassium level was 2.7 mM/ML,  lactic acid was 2.25 mmol/L, glucose was 265 mg/dL The patient received at 1750 normal saline bolus. She also received aztreonam, clindamycin and vancomycin per pharmacy.   Imaging: Chest radiograph shows worsening bibasilar infiltrates. Left wrist x-ray shows moderate soft tissue swelling with no definite acute osseous abnormality. No acute findings on CT of the head and cervical spine, but showing very mild basal ganglia lacunar infarcts, chronic ischemic microvascular white matter disease and chronic sinusitis on head CT, osteoarthritis and DDD of the C-spine.    Review of Systems: As per HPI otherwise 10 point review of systems negative.    Past Medical History:  Diagnosis Date  . Altered mental status 12/09/2012  . Arthritis    "all over" (12/09/2012)  . Chronic bronchitis (HCC)    "couple times/yr" (12/09/2012)  . Chronic lower back pain     . COPD (chronic obstructive pulmonary disease) (HCC)   . Diabetes mellitus without complication (HCC)   . Exertional shortness of breath   . GERD (gastroesophageal reflux disease)    rolaids if needed  . Gout, unspecified   . Hepatitis, unspecified    "the one that's not bad" (12/09/2012)  . History of blood transfusion    "related to a surgery, I think" (12/09/2012)  . Hyperlipidemia   . Hypertension   . Migraines    "in the past" (12/09/2012)  . Osteoarthrosis, unspecified whether generalized or localized, unspecified site   . Other chronic sinusitis   . Pneumonia    "couple times; long time ago" (12/09/2012)  . Unspecified chronic bronchitis    sees Dr. Melba Coon. Young, last visit 08/2011, treated for E. Coli- resp. /w Ceftin, Feb. 2013    Past Surgical History:  Procedure Laterality Date  . ABDOMINAL HYSTERECTOMY     "w/right ovary" (12/09/2012)  . APPENDECTOMY    . BREAST BIOPSY Right   . CARPAL TUNNEL RELEASE  09/06/2011   Procedure: CARPAL TUNNEL RELEASE;  Surgeon: Kennieth RadArthur F Carter, MD;  [facility];  Service: Orthopedics;  Laterality: Right;  . ESOPHAGOGASTRODUODENOSCOPY N/A 06/04/2013   Procedure: ESOPHAGOGASTRODUODENOSCOPY (EGD);  Surgeon: Graylin ShiverSalem F Ganem, MD;  [facility];  Service: Endoscopy;  Laterality: N/A;  . SHOULDER ARTHROSCOPY W/ ROTATOR CUFF REPAIR Right   . TONSILLECTOMY    . TUBAL LIGATION       reports that she has never smoked. She has never used smokeless tobacco. She reports that she drinks alcohol. She reports that she does  not use drugs.  Allergies  Allergen Reactions  . Codeine Rash     angioedema  . Neomycin Itching  . Penicillins Itching and Rash       . Tetanus Toxoids Rash  . Levofloxacin Rash    Family History  Problem Relation Age of Onset  . Heart disease Father   . ALS Brother     Prior to Admission medications   Medication Sig Start Date End Date Taking? Authorizing Provider  Ascorbic Acid (VITAMIN C) 500 MG tablet Take  500 mg by mouth daily.     Yes Historical Provider, MD  cholecalciferol (VITAMIN D) 1000 UNITS tablet Take 1,000 Units by mouth daily.   Yes Historical Provider, MD  donepezil (ARICEPT) 5 MG tablet TAKE 1 TABLET (5 MG TOTAL) BY MOUTH AT BEDTIME. 07/04/15  Yes Etta Grandchild, MD  feeding supplement, ENSURE COMPLETE, (ENSURE COMPLETE) LIQD Take 237 mLs by mouth 2 (two) times daily between meals. 06/23/13  Yes Catarina Hartshorn, MD  hydrochlorothiazide (MICROZIDE) 12.5 MG capsule Take 1 capsule (12.5 mg total) by mouth daily. 11/15/14  Yes Etta Grandchild, MD  IRON PO Take 1 tablet by mouth daily.   Yes Historical Provider, MD  loratadine (CLARITIN) 10 MG tablet TAKE 1 TABLET (10 MG TOTAL) BY MOUTH DAILY. 09/01/15  Yes Etta Grandchild, MD  MELATONIN PO Take 1 tablet by mouth daily.    Yes Historical Provider, MD  Multiple Vitamin (MULTIVITAMIN) capsule Take 1 capsule by mouth daily.     Yes Historical Provider, MD  potassium chloride (KLOR-CON) 20 MEQ packet Take 20 mEq by mouth 2 (two) times daily. 11/25/14  Yes Etta Grandchild, MD  simvastatin (ZOCOR) 20 MG tablet Take 1 tablet (20 mg total) by mouth at bedtime. 11/15/14  Yes Etta Grandchild, MD  Tiotropium Bromide-Olodaterol (STIOLTO RESPIMAT) 2.5-2.5 MCG/ACT AERS Inhale 2 puffs into the lungs daily. 10/17/15  Yes Etta Grandchild, MD  traZODone (DESYREL) 50 MG tablet Take 1 tablet (50 mg total) by mouth at bedtime. 06/09/15  Yes Etta Grandchild, MD  acetaminophen (TYLENOL) 500 MG tablet Take 500 mg by mouth every 6 (six) hours as needed. For pain    Historical Provider, MD  glucose blood test strip Use to check blood sugar once daily. 05/02/15   Etta Grandchild, MD  venlafaxine (EFFEXOR) 37.5 MG tablet Take 37.5 mg by mouth daily as needed (hot flashes).    Historical Provider, MD  verapamil (COVERA HS) 240 MG (CO) 24 hr tablet Take 1 tablet (240 mg total) by mouth at bedtime. 11/15/14   Etta Grandchild, MD    Physical Exam:  Constitutional: Looks acutely ill, febrile.   Vitals:   02/05/16 1811 02/05/16 2007 02/05/16 2026 02/05/16 2039  BP: (!) 171/112  155/90   Pulse: 108  84   Resp: 18  25   Temp: 97.5 F (36.4 C)   101.1 F (38.4 C)  TempSrc: Oral   Rectal  SpO2: 100%  100%   Weight:  49.9 kg (110 lb)    Height:  5' 2.5" (1.588 m)     Eyes: PERRL, lids and conjunctivae normal ENMT: Mucous membranes are moist and with orange colored food residue, but lips are dry. Posterior pharynx is mildly erythematosus, but unable to fully evaluate.  Neck: supple, right sided tender adenopathy. No masses, no thyromegaly Respiratory: Bibasilar rales,, no crackles. Normal respiratory effort. No accessory muscle use.  Cardiovascular: Regular rate and rhythm, no murmurs / rubs /  gallops. No extremity edema. 2+ pedal pulses. No carotid bruits.  Abdomen: no tenderness, no masses palpated. No hepatosplenomegaly. Bowel sounds positive.  Musculoskeletal: no clubbing / cyanosis. Left wrist edema, tenderness to palpation and and decreased ROM, no contractures.  Skin: Positive erythema and calor of the left wrist area. Neurologic: CN 2-12 grossly intact. Moves all extremities. Psychiatric:Alert and oriented x 2, partially oriented to time and situation.. Depressed mood.     Labs on Admission: I have personally reviewed following labs and imaging studies  CBC:  Recent Labs Lab 02/05/16 1818  WBC 14.4*  HGB 12.2  HCT 36.8  MCV 87.2  PLT 265   Basic Metabolic Panel:  Recent Labs Lab 02/05/16 1818  NA 145  K 2.7*  CL 108  CO2 27  GLUCOSE 265*  BUN 20  CREATININE 1.15*  CALCIUM 9.1   GFR: Estimated Creatinine Clearance: 33.8 mL/min (by C-G formula based on SCr of 1.15 mg/dL (H)). Liver Function Tests:  Recent Labs Lab 02/05/16 1818  AST 25  ALT 16  ALKPHOS 119  BILITOT 0.5  PROT 7.7  ALBUMIN 3.1*   No results for input(s): LIPASE, AMYLASE in the last 168 hours. No results for input(s): AMMONIA in the last 168 hours. Coagulation Profile: No  results for input(s): INR, PROTIME in the last 168 hours. Cardiac Enzymes: No results for input(s): CKTOTAL, CKMB, CKMBINDEX, TROPONINI in the last 168 hours. BNP (last 3 results) No results for input(s): PROBNP in the last 8760 hours. HbA1C: No results for input(s): HGBA1C in the last 72 hours. CBG:  Recent Labs Lab 02/05/16 1910 02/05/16 2113  GLUCAP 231* 192*   Lipid Profile: No results for input(s): CHOL, HDL, LDLCALC, TRIG, CHOLHDL, LDLDIRECT in the last 72 hours. Thyroid Function Tests: No results for input(s): TSH, T4TOTAL, FREET4, T3FREE, THYROIDAB in the last 72 hours. Anemia Panel: No results for input(s): VITAMINB12, FOLATE, FERRITIN, TIBC, IRON, RETICCTPCT in the last 72 hours. Urine analysis:    Component Value Date/Time   COLORURINE YELLOW 02/05/2016 2043   APPEARANCEUR CLEAR 02/05/2016 2043   LABSPEC 1.029 02/05/2016 2043   PHURINE 6.5 02/05/2016 2043   GLUCOSEU >1000 (A) 02/05/2016 2043   GLUCOSEU NEGATIVE 03/23/2015 1432   HGBUR NEGATIVE 02/05/2016 2043   BILIRUBINUR NEGATIVE 02/05/2016 2043   KETONESUR NEGATIVE 02/05/2016 2043   PROTEINUR 100 (A) 02/05/2016 2043   UROBILINOGEN 0.2 03/23/2015 1432   NITRITE NEGATIVE 02/05/2016 2043   LEUKOCYTESUR NEGATIVE 02/05/2016 2043    Radiological Exams on Admission: Dg Chest 2 View  Result Date: 02/05/2016 CLINICAL DATA:  Increased lethargy, chest congestion, and fever. EXAM: CHEST  2 VIEW COMPARISON:  10/17/2015 FINDINGS: Kyphotic angulation on the AP radiograph with the cardiac silhouette partially obscured. A hiatal hernia is again seen. Lung volumes are diminished compared to the prior study with persistent bibasilar opacities, left greater than right and mildly increased from the prior study. No sizable pleural effusion or pneumothorax is identified. Thoracolumbar scoliosis is again noted. IMPRESSION: 1. Chronic bibasilar opacities which have mildly increased from the prior study and may reflect a combination of  atelectasis and chronic aspiration. Superimposed infectious pneumonia is also possible. 2. Hiatal hernia. Electronically Signed   By: Sebastian Ache M.D.   On: 02/05/2016 20:24   Dg Wrist Complete Left  Result Date: 02/05/2016 CLINICAL DATA:  Increased lethargy left wrist swollen and hot to touch EXAM: LEFT WRIST - COMPLETE 3+ VIEW COMPARISON:  None. FINDINGS: Moderate diffuse soft tissue swelling. No radiopaque foreign body. Bones  appear osteopenic. No obvious fracture lucency however exam limited by osteopenia. Degenerative changes at the base of the thumb. IMPRESSION: 1. Moderate soft tissue swelling. No definite acute osseous abnormality allowing for osteopenia. 2. If soft tissue or joint infection is suspected, MRI may be obtained for further evaluation. Electronically Signed   By: Jasmine Pang M.D.   On: 02/05/2016 20:25   Ct Head Wo Contrast  Result Date: 02/05/2016 CLINICAL DATA:  Unwitnessed fall today.  Neck pain. EXAM: CT HEAD WITHOUT CONTRAST CT CERVICAL SPINE WITHOUT CONTRAST TECHNIQUE: Multidetector CT imaging of the head and cervical spine was performed following the standard protocol without intravenous contrast. Multiplanar CT image reconstructions of the cervical spine were also generated. COMPARISON:  06/21/2013 and 12/10/2012 FINDINGS: CT HEAD FINDINGS Brain: 6 mm hypodense lesion in the right anterior thalamus compatible with remote lacunar infarct. Suspected small remote lacunar infarcts in the lentiform nuclei and involving the caudate heads. Periventricular white matter and corona radiata hypodensities favor chronic ischemic microvascular white matter disease. Otherwise, The brainstem, cerebellum, cerebral peduncles, thalami, basal ganglia, basilar cisterns, and ventricular system appear within normal limits. No intracranial hemorrhage, mass lesion, or acute CVA. Vascular: Unremarkable Skull: Unremarkable Sinuses/Orbits: Mild chronic right maxillary sinusitis. Partial left  ethmoidectomy with left maxillary antrostomy. Small osteoma along the left ethmoid air cells. Chronic ethmoid and bilateral frontal sinusitis. Other: No supplemental non-categorized findings. CT CERVICAL SPINE FINDINGS Alignment: The neck is significantly rotated during imaging and this could not be corrected. 1.5 mm anterolisthesis at C4-5 and 1.5 mm retrolisthesis at C5-6. There is 2 mm degenerative anterolisthesis at C7-T1. Skull base and vertebrae: Degenerative spurring and loss of articular space at the anterior C1-2 articulation. Small amount of pannus posterior to the odontoid. Loss of disc height and endplate sclerosis at C5-6 and C6-7. No fracture is identified. There is some mild prominence the . prevertebral soft tissues. Chronic sphenoid sinusitis. Soft tissues and spinal canal: Unremarkable Disc levels: Uncinate spurring on the left at C5-6 and C6-7, contributing to mild osseous foraminal narrowing. Upper chest: Unremarkable Other: No supplemental non-categorized findings. IMPRESSION: 1. Right the line make an basal ganglia remote lacunar infarcts without acute intracranial findings. 2. Periventricular white matter and corona radiata hypodensities favor chronic ischemic microvascular white matter disease. 3. Chronic maxillary and sphenoid sinusitis along with chronic ethmoid sinusitis. Prior left ethmoid surgery. Chronic frontal sinusitis. 4. Cervical spondylosis and degenerative disc disease without fracture or subluxation identified. Suboptimal positioning during scanning of the cervical spine due to twisting of the neck which the patient was not able to correct. 5. Mild osseous foraminal stenosis on the left at C5-6 and C6-7 due to uncinate spurring. Electronically Signed   By: Gaylyn Rong M.D.   On: 02/05/2016 20:02   Ct Cervical Spine Wo Contrast  Result Date: 02/05/2016 CLINICAL DATA:  Unwitnessed fall today.  Neck pain. EXAM: CT HEAD WITHOUT CONTRAST CT CERVICAL SPINE WITHOUT CONTRAST  TECHNIQUE: Multidetector CT imaging of the head and cervical spine was performed following the standard protocol without intravenous contrast. Multiplanar CT image reconstructions of the cervical spine were also generated. COMPARISON:  06/21/2013 and 12/10/2012 FINDINGS: CT HEAD FINDINGS Brain: 6 mm hypodense lesion in the right anterior thalamus compatible with remote lacunar infarct. Suspected small remote lacunar infarcts in the lentiform nuclei and involving the caudate heads. Periventricular white matter and corona radiata hypodensities favor chronic ischemic microvascular white matter disease. Otherwise, The brainstem, cerebellum, cerebral peduncles, thalami, basal ganglia, basilar cisterns, and ventricular system appear within normal  limits. No intracranial hemorrhage, mass lesion, or acute CVA. Vascular: Unremarkable Skull: Unremarkable Sinuses/Orbits: Mild chronic right maxillary sinusitis. Partial left ethmoidectomy with left maxillary antrostomy. Small osteoma along the left ethmoid air cells. Chronic ethmoid and bilateral frontal sinusitis. Other: No supplemental non-categorized findings. CT CERVICAL SPINE FINDINGS Alignment: The neck is significantly rotated during imaging and this could not be corrected. 1.5 mm anterolisthesis at C4-5 and 1.5 mm retrolisthesis at C5-6. There is 2 mm degenerative anterolisthesis at C7-T1. Skull base and vertebrae: Degenerative spurring and loss of articular space at the anterior C1-2 articulation. Small amount of pannus posterior to the odontoid. Loss of disc height and endplate sclerosis at C5-6 and C6-7. No fracture is identified. There is some mild prominence the . prevertebral soft tissues. Chronic sphenoid sinusitis. Soft tissues and spinal canal: Unremarkable Disc levels: Uncinate spurring on the left at C5-6 and C6-7, contributing to mild osseous foraminal narrowing. Upper chest: Unremarkable Other: No supplemental non-categorized findings. IMPRESSION: 1. Right  the line make an basal ganglia remote lacunar infarcts without acute intracranial findings. 2. Periventricular white matter and corona radiata hypodensities favor chronic ischemic microvascular white matter disease. 3. Chronic maxillary and sphenoid sinusitis along with chronic ethmoid sinusitis. Prior left ethmoid surgery. Chronic frontal sinusitis. 4. Cervical spondylosis and degenerative disc disease without fracture or subluxation identified. Suboptimal positioning during scanning of the cervical spine due to twisting of the neck which the patient was not able to correct. 5. Mild osseous foraminal stenosis on the left at C5-6 and C6-7 due to uncinate spurring. Electronically Signed   By: Gaylyn Rong M.D.   On: 02/05/2016 20:02    EKG: Independently reviewed. Vent. rate 105 BPM PR interval * ms QRS duration 76 ms QT/QTc 323/427 ms P-R-T axes 63 65 -30 Sinus tachycardia Borderline T abnormalities, inferior leads  Assessment/Plan Principal Problem:   HCAP (healthcare-associated pneumonia)   COPD (chronic obstructive pulmonary disease) (HCC)   History of bronchiectasis. Admit to telemetry as/inpatient Continue supplemental oxygen. DuoNeb scheduled every 6 hours. Albuterol 2.5 mg every 4 hours neb when necessary. Continue aztreonam, clindamycin and vancomycin per pharmacy. Follow-up blood cultures and sensitivity. Check a sputum Gram stain, culture and sensitivity. Check strep pneumoniae urinary antigen.  Active Problems:   Essential hypertension Continue verapamil 240 mg by mouth daily at bedtime. Continue hydrochlorothiazide 12.5 mg by mouth daily. Continue potassium supplementation. Monitor blood pressure.    GERD (gastroesophageal reflux disease) Protonix 40 mg by mouth daily    Type 2 diabetes mellitus (HCC) Carbohydrate modified diet. CBG monitoring with sensitive regular insulin sliding scale while in the hospital.    Hyperlipidemia with target LDL less than  100 Continue simvastatin 20 mg by mouth daily. Monitor lipid panel and LFTs periodically.    Hypokalemia Continue potassium supplementation. Check magnesium level. Follow-up potassium level.    Insomnia due to anxiety and fear Continue trazodone 50 mg by mouth at bedtime.    Dementia without behavioral disturbance. Continue Aricept 5 mg by mouth at bedtime. Continue Effexor 37.5 mg by mouth daily.     DVT prophylaxis: Lovenox SQ. Code Status: Full code. Family Communication: Her nephew Lorin Picket was present in the ED room. Disposition Plan: Admit for IV antibiotic therapy for several days. Consults called:  Admission status: Inpatient/telemetry.   Bobette Mo MD [facility] Pager [phone].  If 7PM-7AM, please contact night-coverage [email or web address] Password [facility]  02/05/2016, 10:27 PM

## 2016-02-06 LAB — CBC WITH DIFFERENTIAL/PLATELET
BASOS PCT: 0 %
Basophils Absolute: 0 10*3/uL (ref 0.0–0.1)
Eosinophils Absolute: 0 10*3/uL (ref 0.0–0.7)
Eosinophils Relative: 0 %
HEMATOCRIT: 30.7 % — AB (ref 36.0–46.0)
HEMOGLOBIN: 10.4 g/dL — AB (ref 12.0–15.0)
LYMPHS ABS: 2.1 10*3/uL (ref 0.7–4.0)
Lymphocytes Relative: 19 %
MCH: 29 pg (ref 26.0–34.0)
MCHC: 33.9 g/dL (ref 30.0–36.0)
MCV: 85.5 fL (ref 78.0–100.0)
MONOS PCT: 8 %
Monocytes Absolute: 0.9 10*3/uL (ref 0.1–1.0)
NEUTROS ABS: 7.7 10*3/uL (ref 1.7–7.7)
NEUTROS PCT: 73 %
Platelets: 206 10*3/uL (ref 150–400)
RBC: 3.59 MIL/uL — AB (ref 3.87–5.11)
RDW: 13.9 % (ref 11.5–15.5)
WBC: 10.7 10*3/uL — ABNORMAL HIGH (ref 4.0–10.5)

## 2016-02-06 LAB — GLUCOSE, CAPILLARY
GLUCOSE-CAPILLARY: 148 mg/dL — AB (ref 65–99)
GLUCOSE-CAPILLARY: 133 mg/dL — AB (ref 65–99)
Glucose-Capillary: 113 mg/dL — ABNORMAL HIGH (ref 65–99)
Glucose-Capillary: 129 mg/dL — ABNORMAL HIGH (ref 65–99)
Glucose-Capillary: 200 mg/dL — ABNORMAL HIGH (ref 65–99)

## 2016-02-06 LAB — COMPREHENSIVE METABOLIC PANEL
ALT: 12 U/L — ABNORMAL LOW (ref 14–54)
ANION GAP: 8 (ref 5–15)
AST: 22 U/L (ref 15–41)
Albumin: 2.4 g/dL — ABNORMAL LOW (ref 3.5–5.0)
Alkaline Phosphatase: 90 U/L (ref 38–126)
BUN: 17 mg/dL (ref 6–20)
CHLORIDE: 113 mmol/L — AB (ref 101–111)
CO2: 22 mmol/L (ref 22–32)
Calcium: 7.9 mg/dL — ABNORMAL LOW (ref 8.9–10.3)
Creatinine, Ser: 1.03 mg/dL — ABNORMAL HIGH (ref 0.44–1.00)
GFR calc non Af Amer: 52 mL/min — ABNORMAL LOW (ref >60)
Glucose, Bld: 246 mg/dL — ABNORMAL HIGH (ref 65–99)
Potassium: 2.9 mmol/L — ABNORMAL LOW (ref 3.5–5.1)
SODIUM: 143 mmol/L (ref 135–145)
Total Bilirubin: 0.5 mg/dL (ref 0.3–1.2)
Total Protein: 6.1 g/dL — ABNORMAL LOW (ref 6.5–8.1)

## 2016-02-06 LAB — LACTIC ACID, PLASMA
LACTIC ACID, VENOUS: 1.3 mmol/L (ref 0.5–1.9)
Lactic Acid, Venous: 2.8 mmol/L (ref 0.5–1.9)
Lactic Acid, Venous: 1.3 mmol/L (ref 0.5–1.9)

## 2016-02-06 LAB — MRSA PCR SCREENING: MRSA BY PCR: NEGATIVE

## 2016-02-06 LAB — POTASSIUM: Potassium: 3.4 mmol/L — ABNORMAL LOW (ref 3.5–5.1)

## 2016-02-06 LAB — PHOSPHORUS: Phosphorus: 2.3 mg/dL — ABNORMAL LOW (ref 2.5–4.6)

## 2016-02-06 LAB — STREP PNEUMONIAE URINARY ANTIGEN: STREP PNEUMO URINARY ANTIGEN: NEGATIVE

## 2016-02-06 LAB — MAGNESIUM: Magnesium: 1.4 mg/dL — ABNORMAL LOW (ref 1.7–2.4)

## 2016-02-06 MED ORDER — MAGNESIUM SULFATE 4 GM/100ML IV SOLN
4.0000 g | Freq: Once | INTRAVENOUS | Status: AC
  Administered 2016-02-06: 4 g via INTRAVENOUS
  Filled 2016-02-06: qty 100

## 2016-02-06 MED ORDER — IPRATROPIUM-ALBUTEROL 0.5-2.5 (3) MG/3ML IN SOLN
3.0000 mL | Freq: Three times a day (TID) | RESPIRATORY_TRACT | Status: DC
  Administered 2016-02-06 – 2016-02-08 (×5): 3 mL via RESPIRATORY_TRACT
  Filled 2016-02-06 (×5): qty 3

## 2016-02-06 MED ORDER — POTASSIUM CHLORIDE 10 MEQ/100ML IV SOLN
INTRAVENOUS | Status: AC
  Administered 2016-02-06: 10 meq via INTRAVENOUS
  Filled 2016-02-06: qty 100

## 2016-02-06 MED ORDER — SODIUM CHLORIDE 0.9 % IV BOLUS (SEPSIS)
1000.0000 mL | Freq: Once | INTRAVENOUS | Status: AC
  Administered 2016-02-06: 1000 mL via INTRAVENOUS

## 2016-02-06 MED ORDER — POTASSIUM CHLORIDE CRYS ER 20 MEQ PO TBCR: 20.0000 meq | EXTENDED_RELEASE_TABLET | Freq: Once | ORAL | Status: DC

## 2016-02-06 MED ORDER — ATORVASTATIN CALCIUM 10 MG PO TABS
10.0000 mg | ORAL_TABLET | Freq: Every day | ORAL | Status: DC
  Administered 2016-02-06: 10 mg via ORAL
  Filled 2016-02-06: qty 1

## 2016-02-06 MED ORDER — POTASSIUM PHOSPHATES 15 MMOLE/5ML IV SOLN
40.0000 meq | Freq: Once | INTRAVENOUS | Status: AC
  Administered 2016-02-06: 40 meq via INTRAVENOUS
  Filled 2016-02-06: qty 9.09

## 2016-02-06 MED ORDER — DEXTROSE 5 % IV SOLN
1.0000 g | Freq: Two times a day (BID) | INTRAVENOUS | Status: DC
  Administered 2016-02-06 – 2016-02-07 (×4): 1 g via INTRAVENOUS
  Filled 2016-02-06 (×5): qty 1

## 2016-02-06 NOTE — Progress Notes (Signed)
Lactic acid 2.8, on-call notified. RN informed.

## 2016-02-06 NOTE — Progress Notes (Signed)
The order for simvastatin(Zocor) was changed to an equivalent dose of atorvastatin(Lipitor) due to the potential drug interaction with verapamil.  When taken in combination with medications that inhibit its metabolism, simvastatin can accumulate which increases the risk of liver toxicity, myopathy, or rhabdomyolysis.  Simvastatin dose should not exceed 10mg /day in patients taking verapamil.   Please consider this potential interaction at discharge.  Bernadene Personrew Rose-Marie Hickling, PharmD, BCPS Pager: [phone] 02/06/2016, 12:14 PM

## 2016-02-06 NOTE — ED Notes (Signed)
Received report on pt. Pt noted to be resting quietly, with nephew at bedside. Monitoring continues.

## 2016-02-06 NOTE — Evaluation (Signed)
Physical Therapy Evaluation Patient Details Name: Sheila Cisneros MRN: [id number] DOB: 09/21/1940 Today's Date: 02/06/2016   History of Present Illness  75 yo female admitted with Pna. Hx of HTN, COPD, OA, DM, chronic pain, gout.   Clinical Impression  On eval, pt required max assist for mobility. Mod encouragement and increased time to complete tasks. Mobility limited by weakness, pain, fatigue, and lethargy/drowsiness. Nephew present during session-very helpful with PLOF info. Will follow and progress activity as tolerated. At this [facility]     Follow Up Recommendations SNF    Equipment Recommendations       Recommendations for Other Services OT consult     Precautions / Restrictions Precautions Precautions: Fall Restrictions Weight Bearing Restrictions: No      Mobility  Bed Mobility Overal bed mobility: Needs Assistance Bed Mobility: Rolling;Supine to Sit;Sit to Supine Rolling: Mod assist   Supine to sit: Max assist Sit to supine: Max assist   General bed mobility comments: Assist for trunk and bil LEs. Increased time for all tasks. Mod encouragement for participation. Utilized bedpad for scooting, positioning  Transfers                 General transfer comment: NT-pt unable to perform at this time-too weak, lethargic  Ambulation/Gait                Stairs            Wheelchair Mobility    Modified Rankin (Stroke Patients Only)       Balance Overall balance assessment: Needs assistance   Sitting balance-Leahy Scale: Poor Sitting balance - Comments: Sat EOB ~1-2 minutes.                                      Pertinent Vitals/Pain Pain Assessment: Faces Faces Pain Scale: Hurts whole lot Pain Location: neck, L LE with activity Pain Descriptors / Indicators: Grimacing Pain Intervention(s): Limited activity within patient's tolerance    Home Living Family/patient expects to be discharged to::  Unsure Living Arrangements: Alone Available Help at Discharge: Family;Available PRN/intermittently           Home Equipment: Gilmer MorCane - single point      Prior Function Level of Independence: Needs assistance   Gait / Transfers Assistance Needed: using cane for ambulation ~1 week prior to admission. However, within last week pt has been less and less mobile.           Hand Dominance        Extremity/Trunk Assessment   Upper Extremity Assessment: Generalized weakness           Lower Extremity Assessment: Generalized weakness;LLE deficits/detail   LLE Deficits / Details: decreased ROM, strength (family reports arthritis)  Cervical / Trunk Assessment: Normal  Communication   Communication: No difficulties  Cognition Arousal/Alertness: Lethargic   Overall Cognitive Status: Difficult to assess                      General Comments      Exercises     Assessment/Plan    PT Assessment Patient needs continued PT services  PT Problem List Decreased strength;Decreased mobility;Decreased activity tolerance;Decreased range of motion;Decreased balance;Pain;Decreased knowledge of use of DME          PT Treatment Interventions DME instruction;Therapeutic activities;Gait training;Balance training;Functional mobility training;Patient/family education;Therapeutic exercise    PT Goals (  Current goals can be found in the Care Plan section)  Acute Rehab PT Goals Patient Stated Goal: none stated by pt. family would like to see her get better PT Goal Formulation: With family Time For Goal Achievement: 02/20/16 Potential to Achieve Goals: Fair    Frequency Min 3X/week   Barriers to discharge        Co-evaluation               End of Session   Activity Tolerance: Patient limited by lethargy;Patient limited by fatigue Patient left: in bed;with call bell/phone within reach;with family/visitor present;with bed alarm set           Time: 8295-6213 PT  Time Calculation (min) (ACUTE ONLY): 26 min   Charges:   PT Evaluation $PT Eval Low Complexity: 1 Procedure PT Treatments $Therapeutic Activity: 8-22 mins   PT G Codes:        Rebeca Alert, MPT Pager: [phone]

## 2016-02-06 NOTE — Evaluation (Signed)
Clinical/Bedside Swallow Evaluation Patient Details  Name: Sheila Cisneros MRN: [id number] Date of Birth: November 18, 1940  Today's Date: 02/06/2016 Time: SLP Start Time (ACUTE ONLY): 1535 SLP Stop Time (ACUTE ONLY): 1550 SLP Time Calculation (min) (ACUTE ONLY): 15 min  Past Medical History:  Past Medical History:  Diagnosis Date  . Altered mental status 12/09/2012  . Arthritis    "all over" (12/09/2012)  . Chronic bronchitis (HCC)    "couple times/yr" (12/09/2012)  . Chronic lower back pain   . COPD (chronic obstructive pulmonary disease) (HCC)   . Diabetes mellitus without complication (HCC)   . Exertional shortness of breath   . GERD (gastroesophageal reflux disease)    rolaids if needed  . Gout, unspecified   . Hepatitis, unspecified    "the one that's not bad" (12/09/2012)  . History of blood transfusion    "related to a surgery, I think" (12/09/2012)  . Hyperlipidemia   . Hypertension   . Migraines    "in the past" (12/09/2012)  . Osteoarthrosis, unspecified whether generalized or localized, unspecified site   . Other chronic sinusitis   . Pneumonia    "couple times; long time ago" (12/09/2012)  . Unspecified chronic bronchitis    sees Dr. Melba Coon, last visit 08/2011, treated for E. Coli- resp. /w Ceftin, Feb. 2013   Past Surgical History:  Past Surgical History:  Procedure Laterality Date  . ABDOMINAL HYSTERECTOMY     "w/right ovary" (12/09/2012)  . APPENDECTOMY    . BREAST BIOPSY Right   . CARPAL TUNNEL RELEASE  09/06/2011   Procedure: CARPAL TUNNEL RELEASE;  Surgeon: Kennieth Rad, MD;  Location: [facility] OR;  Service: Orthopedics;  Laterality: Right;  . ESOPHAGOGASTRODUODENOSCOPY N/A 06/04/2013   Procedure: ESOPHAGOGASTRODUODENOSCOPY (EGD);  Surgeon: Graylin Shiver, MD;  Location: [facility] ENDOSCOPY;  Service: Endoscopy;  Laterality: N/A;  . SHOULDER ARTHROSCOPY W/ ROTATOR CUFF REPAIR Right   . TONSILLECTOMY    . TUBAL LIGATION     HPI:  75 y.o.femalewith medical history  significant for osteoarthritis, COPD, bronchiectasis, chronic lower back pain, type 2 diabetes, GERD, gout, hypertension, hyperlipidemia, previous pneumonia episodes in the past who comes to the ED via EMS due progressively worse fatigue, congestion and fever. Chest radiograph shows worsening bibasilar infiltrates. Dx HCAP, COPD.  Prior notes from pulmonary medicine suggest intermittent aspiration as a possibility.  There are notes from PCP suggesting deterioration in cognition;  but last visit 12/04/15 with no concerns for cognition or depression.     Assessment / Plan / Recommendation Clinical Impression  Pt lethargic, requiring encouragement to awaken for participation in limited assessment of swallow.  No focal deficits; consumed thin liquids with no overt s/s of aspiration; brisk swallow response.  Active mastication of purees; but regular solids not offered given lethargy.  Pt with intermittent wet, congested cough at baseline prior to POs.  Recommend allowing soft solids, thin liquids as pt is able given MS.  Meds whole in puree.  SLP will follow for more thorough assessment as pt is able to participate.  D/W RN.     Aspiration Risk  Mild aspiration risk    Diet Recommendation   soft solids, thin liquids  Medication Administration: Whole meds with puree    Other  Recommendations Oral Care Recommendations: Oral care BID   Follow up Recommendations        Frequency and Duration min 2x/week  1 week       Prognosis        Swallow  Study   General HPI: 75 y.o.femalewith medical history significant for osteoarthritis, COPD, bronchiectasis, chronic lower back pain, type 2 diabetes, GERD, gout, hypertension, hyperlipidemia, previous pneumonia episodes in the past who comes to the ED via EMS due progressively worse fatigue, congestion and fever. Chest radiograph shows worsening bibasilar infiltrates. Dx HCAP, COPD.  Prior notes from pulmonary medicine suggest intermittent aspiration as a  possibility.  There are notes from PCP suggesting deterioration in cognition; last visit 12/04/15 with no concerns for cognition or depression.   Type of Study: Bedside Swallow Evaluation Previous Swallow Assessment: no Diet Prior to this Study: Regular;Thin liquids (HH/carb modified) Temperature Spikes Noted: No Respiratory Status: Nasal cannula History of Recent Intubation: No Behavior/Cognition: Lethargic/Drowsy Oral Cavity Assessment: Within Functional Limits Oral Care Completed by SLP: No Self-Feeding Abilities:  (unable today due to lethargy) Patient Positioning: Upright in bed Baseline Vocal Quality: Normal Volitional Cough: Strong Volitional Swallow: Able to elicit    Oral/Motor/Sensory Function Overall Oral Motor/Sensory Function: Within functional limits   Ice Chips Ice chips: Within functional limits   Thin Liquid Thin Liquid: Within functional limits Presentation: Straw    Nectar Thick Nectar Thick Liquid: Not tested   Honey Thick Honey Thick Liquid: Not tested   Puree Puree: Within functional limits   Solid   GO   Solid: Not tested        [name], Deyna Carbon Laurice 02/06/2016,4:03 PM  Marchelle FolksAmanda L. Samson Fredericouture, KentuckyMA CCC/SLP Pager [phone]

## 2016-02-06 NOTE — Progress Notes (Signed)
PROGRESS NOTE  Sheila Amassrrie L Shor  ZOX:[id number] DOB: Nov 28, 1940 DOA: 02/05/2016 PCP: Sanda Lingerhomas Jones, MD  Brief Narrative:   Sheila Cisneros is a 75 y.o. female with osteoarthritis, COPD, bronchiectasis, chronic lower back pain, type 2 diabetes, GERD, gout, hypertension, hyperlipidemia, previous pneumonia episodes in the past who comes to the ED via EMS due progressively worse fatigue, congestion and fever.   Temperature of 101.88F in the emergency department.  WBC was 14.4, potassium level was 2.7 mM/ML,  lactic acid was 2.25 mmol/L.  Chest radiograph showed worsening bibasilar infiltrates. Left wrist x-ray showed moderate soft tissue swelling with no definite acute osseous abnormality.  She was started on broad spectrum antibiotics and IVF.     Assessment & Plan:   Principal Problem:   HCAP (healthcare-associated pneumonia) Active Problems:   Essential hypertension   GERD (gastroesophageal reflux disease)   Dementia without behavioral disturbance   Type II diabetes mellitus with manifestations (HCC)   Hyperlipidemia with target LDL less than 100   Hypokalemia   COPD (chronic obstructive pulmonary disease) (HCC)   Insomnia due to anxiety and fear   Type 2 diabetes mellitus (HCC)  Acute respiratory failure with hypoxia secondary to HCAP (healthcare-associated pneumonia)   COPD (chronic obstructive pulmonary disease) (HCC)   History of bronchiectasis. Tele:  NSR, okay to d/c  Continue duonebs Continue vancomycin Change clinda + aztreonam to cefepime MRSA PCR Swallow evaluation given dementia, bedbound Blood cultures NGTD strep pneumo neg  Active Problems:   Essential hypertension, blood pressures dropping some Continue verapamil 240 mg daily Holding HCTZ    GERD (gastroesophageal reflux disease) Protonix 40 mg by mouth daily    Type 2 diabetes mellitus (HCC) Carbohydrate modified diet. CBG monitoring with sensitive regular insulin sliding scale while in the hospital.   Hyperlipidemia with target LDL less than 100 Continue simvastatin 20 mg by mouth daily. Monitor lipid panel and LFTs periodically.    Hypokalemia, hypomagnesemia, and hypophosphatemia Additional IV and oral potassium supplementation. IV magnesium supplementation IV phosphorus supplementation repeat potassium this afternoon    Insomnia due to anxiety and fear Continue trazodone 50 mg by mouth at bedtime.    Dementia without behavioral disturbance. Continue Aricept 5 mg by mouth at bedtime. Continue Effexor 37.5 mg by mouth daily.   DVT prophylaxis: Lovenox SQ. Code Status: Full code. Family Communication:  patient alone, no family at bedside Disposition Plan: Admit for IV antibiotic therapy for several days.  Consultants:   none  Procedures:  none  Antimicrobials:  Anti-infectives    Start     Dose/Rate Route Frequency Ordered Stop   02/06/16 2200  vancomycin (VANCOCIN) IVPB 750 mg/150 ml premix     750 mg 150 mL/hr over 60 Minutes Intravenous Every 24 hours 02/05/16 2002     02/06/16 1230  ceFEPIme (MAXIPIME) 1 g in dextrose 5 % 50 mL IVPB     1 g 100 mL/hr over 30 Minutes Intravenous 2 times daily 02/06/16 1200     02/06/16 0600  aztreonam (AZACTAM) 1 g in dextrose 5 % 50 mL IVPB  Status:  Discontinued     1 g 100 mL/hr over 30 Minutes Intravenous Every 8 hours 02/05/16 2002 02/06/16 1114   02/06/16 0600  clindamycin (CLEOCIN) IVPB 600 mg  Status:  Discontinued     600 mg 100 mL/hr over 30 Minutes Intravenous Every 8 hours 02/05/16 2002 02/06/16 1114   02/05/16 1930  clindamycin (CLEOCIN) IVPB 600 mg     600 mg 100  mL/hr over 30 Minutes Intravenous  Once 02/05/16 1916 02/05/16 2212   02/05/16 1930  aztreonam (AZACTAM) 2 g in dextrose 5 % 50 mL IVPB     2 g 100 mL/hr over 30 Minutes Intravenous  Once 02/05/16 1916 02/05/16 2128   02/05/16 1930  vancomycin (VANCOCIN) IVPB 1000 mg/200 mL premix     1,000 mg 200 mL/hr over 60 Minutes Intravenous  Once 02/05/16  1916 02/05/16 2312        Subjective: Endorses cough, but denies chest pain, shortness of breath, nausea, abdominal pain, diarrhea  Objective: Vitals:   02/05/16 2313 02/06/16 0442 02/06/16 0645 02/06/16 1004  BP: (!) 162/84 111/60    Pulse: 75 70    Resp: (!) 24 (!) 22    Temp: 97.9 F (36.6 C) 97.5 F (36.4 C)    TempSrc: Oral Oral    SpO2: 100% 100%  96%  Weight:   54.4 kg (120 lb)   Height:   5\' 2"  (1.575 m)     Intake/Output Summary (Last 24 hours) at 02/06/16 1314 Last data filed at 02/06/16 4098  Gross per 24 hour  Intake          1218.33 ml  Output                0 ml  Net          1218.33 ml   Filed Weights   02/05/16 2007 02/06/16 0645  Weight: 49.9 kg (110 lb) 54.4 kg (120 lb)    Examination:  General exam:  Adult female, thin and slumped to the left side in bed and needed assistance getting moved in bed.  No acute distress.  HEENT:  NCAT, MMM Respiratory system:  rhonchorous bilateral BS with wheezes and scattered rales Cardiovascular system: Regular rate and rhythm, normal S1/S2. No murmurs, rubs, gallops or clicks.  Warm extremities Gastrointestinal system: Normal active bowel sounds, soft, nondistended, nontender. MSK:  Decreased tone and bulk, no lower extremity edema Neuro:  Grossly moves all extremities but diffusely weak    Data Reviewed: I have personally reviewed following labs and imaging studies  CBC:  Recent Labs Lab 02/05/16 1818 02/06/16 0228  WBC 14.4* 10.7*  NEUTROABS  --  7.7  HGB 12.2 10.4*  HCT 36.8 30.7*  MCV 87.2 85.5  PLT 265 206   Basic Metabolic Panel:  Recent Labs Lab 02/05/16 1818 02/05/16 2338 02/06/16 0224  NA 145  --  143  K 2.7*  --  2.9*  CL 108  --  113*  CO2 27  --  22  GLUCOSE 265*  --  246*  BUN 20  --  17  CREATININE 1.15*  --  1.03*  CALCIUM 9.1  --  7.9*  MG  --  1.4*  --   PHOS  --  2.3*  --    GFR: Estimated Creatinine Clearance: 37.9 mL/min (by C-G formula based on SCr of 1.03 mg/dL  (H)). Liver Function Tests:  Recent Labs Lab 02/05/16 1818 02/06/16 0224  AST 25 22  ALT 16 12*  ALKPHOS 119 90  BILITOT 0.5 0.5  PROT 7.7 6.1*  ALBUMIN 3.1* 2.4*   No results for input(s): LIPASE, AMYLASE in the last 168 hours. No results for input(s): AMMONIA in the last 168 hours. Coagulation Profile: No results for input(s): INR, PROTIME in the last 168 hours. Cardiac Enzymes: No results for input(s): CKTOTAL, CKMB, CKMBINDEX, TROPONINI in the last 168 hours. BNP (last 3 results) No  results for input(s): PROBNP in the last 8760 hours. HbA1C: No results for input(s): HGBA1C in the last 72 hours. CBG:  Recent Labs Lab 02/05/16 1910 02/05/16 2113 02/06/16 0012 02/06/16 0837 02/06/16 1226  GLUCAP 231* 192* 200* 148* 133*   Lipid Profile: No results for input(s): CHOL, HDL, LDLCALC, TRIG, CHOLHDL, LDLDIRECT in the last 72 hours. Thyroid Function Tests: No results for input(s): TSH, T4TOTAL, FREET4, T3FREE, THYROIDAB in the last 72 hours. Anemia Panel: No results for input(s): VITAMINB12, FOLATE, FERRITIN, TIBC, IRON, RETICCTPCT in the last 72 hours. Urine analysis:    Component Value Date/Time   COLORURINE YELLOW 02/05/2016 2043   APPEARANCEUR CLEAR 02/05/2016 2043   LABSPEC 1.029 02/05/2016 2043   PHURINE 6.5 02/05/2016 2043   GLUCOSEU >1000 (A) 02/05/2016 2043   GLUCOSEU NEGATIVE 03/23/2015 1432   HGBUR NEGATIVE 02/05/2016 2043   BILIRUBINUR NEGATIVE 02/05/2016 2043   KETONESUR NEGATIVE 02/05/2016 2043   PROTEINUR 100 (A) 02/05/2016 2043   UROBILINOGEN 0.2 03/23/2015 1432   NITRITE NEGATIVE 02/05/2016 2043   LEUKOCYTESUR NEGATIVE 02/05/2016 2043   Sepsis Labs: @LABRCNTIP (procalcitonin:4,lacticidven:4)  )No results found for this or any previous visit (from the past 240 hour(s)).    Radiology Studies: Dg Chest 2 View  Result Date: 02/05/2016 CLINICAL DATA:  Increased lethargy, chest congestion, and fever. EXAM: CHEST  2 VIEW COMPARISON:  10/17/2015  FINDINGS: Kyphotic angulation on the AP radiograph with the cardiac silhouette partially obscured. A hiatal hernia is again seen. Lung volumes are diminished compared to the prior study with persistent bibasilar opacities, left greater than right and mildly increased from the prior study. No sizable pleural effusion or pneumothorax is identified. Thoracolumbar scoliosis is again noted. IMPRESSION: 1. Chronic bibasilar opacities which have mildly increased from the prior study and may reflect a combination of atelectasis and chronic aspiration. Superimposed infectious pneumonia is also possible. 2. Hiatal hernia. Electronically Signed   By: Sebastian Ache M.D.   On: 02/05/2016 20:24   Dg Wrist Complete Left  Result Date: 02/05/2016 CLINICAL DATA:  Increased lethargy left wrist swollen and hot to touch EXAM: LEFT WRIST - COMPLETE 3+ VIEW COMPARISON:  None. FINDINGS: Moderate diffuse soft tissue swelling. No radiopaque foreign body. Bones appear osteopenic. No obvious fracture lucency however exam limited by osteopenia. Degenerative changes at the base of the thumb. IMPRESSION: 1. Moderate soft tissue swelling. No definite acute osseous abnormality allowing for osteopenia. 2. If soft tissue or joint infection is suspected, MRI may be obtained for further evaluation. Electronically Signed   By: Jasmine Pang M.D.   On: 02/05/2016 20:25   Ct Head Wo Contrast  Result Date: 02/05/2016 CLINICAL DATA:  Unwitnessed fall today.  Neck pain. EXAM: CT HEAD WITHOUT CONTRAST CT CERVICAL SPINE WITHOUT CONTRAST TECHNIQUE: Multidetector CT imaging of the head and cervical spine was performed following the standard protocol without intravenous contrast. Multiplanar CT image reconstructions of the cervical spine were also generated. COMPARISON:  06/21/2013 and 12/10/2012 FINDINGS: CT HEAD FINDINGS Brain: 6 mm hypodense lesion in the right anterior thalamus compatible with remote lacunar infarct. Suspected small remote lacunar  infarcts in the lentiform nuclei and involving the caudate heads. Periventricular white matter and corona radiata hypodensities favor chronic ischemic microvascular white matter disease. Otherwise, The brainstem, cerebellum, cerebral peduncles, thalami, basal ganglia, basilar cisterns, and ventricular system appear within normal limits. No intracranial hemorrhage, mass lesion, or acute CVA. Vascular: Unremarkable Skull: Unremarkable Sinuses/Orbits: Mild chronic right maxillary sinusitis. Partial left ethmoidectomy with left maxillary antrostomy. Small osteoma along  the left ethmoid air cells. Chronic ethmoid and bilateral frontal sinusitis. Other: No supplemental non-categorized findings. CT CERVICAL SPINE FINDINGS Alignment: The neck is significantly rotated during imaging and this could not be corrected. 1.5 mm anterolisthesis at C4-5 and 1.5 mm retrolisthesis at C5-6. There is 2 mm degenerative anterolisthesis at C7-T1. Skull base and vertebrae: Degenerative spurring and loss of articular space at the anterior C1-2 articulation. Small amount of pannus posterior to the odontoid. Loss of disc height and endplate sclerosis at C5-6 and C6-7. No fracture is identified. There is some mild prominence the . prevertebral soft tissues. Chronic sphenoid sinusitis. Soft tissues and spinal canal: Unremarkable Disc levels: Uncinate spurring on the left at C5-6 and C6-7, contributing to mild osseous foraminal narrowing. Upper chest: Unremarkable Other: No supplemental non-categorized findings. IMPRESSION: 1. Right the line make an basal ganglia remote lacunar infarcts without acute intracranial findings. 2. Periventricular white matter and corona radiata hypodensities favor chronic ischemic microvascular white matter disease. 3. Chronic maxillary and sphenoid sinusitis along with chronic ethmoid sinusitis. Prior left ethmoid surgery. Chronic frontal sinusitis. 4. Cervical spondylosis and degenerative disc disease without  fracture or subluxation identified. Suboptimal positioning during scanning of the cervical spine due to twisting of the neck which the patient was not able to correct. 5. Mild osseous foraminal stenosis on the left at C5-6 and C6-7 due to uncinate spurring. Electronically Signed   By: Gaylyn Rong M.D.   On: 02/05/2016 20:02   Ct Cervical Spine Wo Contrast  Result Date: 02/05/2016 CLINICAL DATA:  Unwitnessed fall today.  Neck pain. EXAM: CT HEAD WITHOUT CONTRAST CT CERVICAL SPINE WITHOUT CONTRAST TECHNIQUE: Multidetector CT imaging of the head and cervical spine was performed following the standard protocol without intravenous contrast. Multiplanar CT image reconstructions of the cervical spine were also generated. COMPARISON:  06/21/2013 and 12/10/2012 FINDINGS: CT HEAD FINDINGS Brain: 6 mm hypodense lesion in the right anterior thalamus compatible with remote lacunar infarct. Suspected small remote lacunar infarcts in the lentiform nuclei and involving the caudate heads. Periventricular white matter and corona radiata hypodensities favor chronic ischemic microvascular white matter disease. Otherwise, The brainstem, cerebellum, cerebral peduncles, thalami, basal ganglia, basilar cisterns, and ventricular system appear within normal limits. No intracranial hemorrhage, mass lesion, or acute CVA. Vascular: Unremarkable Skull: Unremarkable Sinuses/Orbits: Mild chronic right maxillary sinusitis. Partial left ethmoidectomy with left maxillary antrostomy. Small osteoma along the left ethmoid air cells. Chronic ethmoid and bilateral frontal sinusitis. Other: No supplemental non-categorized findings. CT CERVICAL SPINE FINDINGS Alignment: The neck is significantly rotated during imaging and this could not be corrected. 1.5 mm anterolisthesis at C4-5 and 1.5 mm retrolisthesis at C5-6. There is 2 mm degenerative anterolisthesis at C7-T1. Skull base and vertebrae: Degenerative spurring and loss of articular space at  the anterior C1-2 articulation. Small amount of pannus posterior to the odontoid. Loss of disc height and endplate sclerosis at C5-6 and C6-7. No fracture is identified. There is some mild prominence the . prevertebral soft tissues. Chronic sphenoid sinusitis. Soft tissues and spinal canal: Unremarkable Disc levels: Uncinate spurring on the left at C5-6 and C6-7, contributing to mild osseous foraminal narrowing. Upper chest: Unremarkable Other: No supplemental non-categorized findings. IMPRESSION: 1. Right the line make an basal ganglia remote lacunar infarcts without acute intracranial findings. 2. Periventricular white matter and corona radiata hypodensities favor chronic ischemic microvascular white matter disease. 3. Chronic maxillary and sphenoid sinusitis along with chronic ethmoid sinusitis. Prior left ethmoid surgery. Chronic frontal sinusitis. 4. Cervical spondylosis and degenerative disc  disease without fracture or subluxation identified. Suboptimal positioning during scanning of the cervical spine due to twisting of the neck which the patient was not able to correct. 5. Mild osseous foraminal stenosis on the left at C5-6 and C6-7 due to uncinate spurring. Electronically Signed   By: Gaylyn Rong M.D.   On: 02/05/2016 20:02     Scheduled Meds: . atorvastatin  10 mg Oral QHS  . ceFEPime (MAXIPIME) IV  1 g Intravenous BID  . donepezil  5 mg Oral QHS  . enoxaparin (LOVENOX) injection  40 mg Subcutaneous QHS  . feeding supplement (ENSURE ENLIVE)  237 mL Oral BID BM  . insulin aspart  0-9 Units Subcutaneous TID WC  . ipratropium-albuterol  3 mL Nebulization Q6H  . loratadine  10 mg Oral Daily  . multivitamin with minerals  1 tablet Oral Daily  . pantoprazole  40 mg Oral Daily  . potassium phosphate IVPB (mEq)  40 mEq Intravenous Once  . sodium chloride flush  3 mL Intravenous Q12H  . traZODone  50 mg Oral QHS  . vancomycin  750 mg Intravenous Q24H  . verapamil  240 mg Oral QHS    Continuous Infusions: . 0.9 % NaCl with KCl 40 mEq / L 100 mL/hr (02/05/16 2357)     LOS: 1 day    Time spent: 30 min    Renae Fickle, MD [facility] Pager [phone]  If 7PM-7AM, please contact night-coverage [email or web address] Password [email or web address] 02/06/2016, 1:14 PM

## 2016-02-07 ENCOUNTER — Ambulatory Visit: Payer: Medicare Other | Admitting: Internal Medicine

## 2016-02-07 DIAGNOSIS — G9341 Metabolic encephalopathy: Secondary | ICD-10-CM

## 2016-02-07 LAB — GLUCOSE, CAPILLARY
GLUCOSE-CAPILLARY: 118 mg/dL — AB (ref 65–99)
GLUCOSE-CAPILLARY: 131 mg/dL — AB (ref 65–99)
Glucose-Capillary: 192 mg/dL — ABNORMAL HIGH (ref 65–99)

## 2016-02-07 LAB — BASIC METABOLIC PANEL
ANION GAP: 5 (ref 5–15)
BUN: 10 mg/dL (ref 6–20)
CHLORIDE: 112 mmol/L — AB (ref 101–111)
CO2: 24 mmol/L (ref 22–32)
Calcium: 8.3 mg/dL — ABNORMAL LOW (ref 8.9–10.3)
Creatinine, Ser: 0.75 mg/dL (ref 0.44–1.00)
GFR calc Af Amer: >60 mL/min (ref >60)
GLUCOSE: 123 mg/dL — AB (ref 65–99)
POTASSIUM: 3.6 mmol/L (ref 3.5–5.1)
Sodium: 141 mmol/L (ref 135–145)

## 2016-02-07 LAB — URINE CULTURE: Culture: NO GROWTH

## 2016-02-07 LAB — CBC
HEMATOCRIT: 31.1 % — AB (ref 36.0–46.0)
HEMOGLOBIN: 10.3 g/dL — AB (ref 12.0–15.0)
MCH: 28.6 pg (ref 26.0–34.0)
MCHC: 33.1 g/dL (ref 30.0–36.0)
MCV: 86.4 fL (ref 78.0–100.0)
Platelets: 243 10*3/uL (ref 150–400)
RBC: 3.6 MIL/uL — AB (ref 3.87–5.11)
RDW: 14.1 % (ref 11.5–15.5)
WBC: 9.6 10*3/uL (ref 4.0–10.5)

## 2016-02-07 LAB — HEMOGLOBIN A1C
Hgb A1c MFr Bld: 6.4 % — ABNORMAL HIGH (ref 4.8–5.6)
Mean Plasma Glucose: 137 mg/dL

## 2016-02-07 LAB — MAGNESIUM: MAGNESIUM: 1.7 mg/dL (ref 1.7–2.4)

## 2016-02-07 MED ORDER — MAGNESIUM SULFATE 2 GM/50ML IV SOLN
2.0000 g | Freq: Once | INTRAVENOUS | Status: AC
  Administered 2016-02-07: 2 g via INTRAVENOUS
  Filled 2016-02-07: qty 50

## 2016-02-07 MED ORDER — FAMOTIDINE 20 MG PO TABS: 20.0000 mg | ORAL_TABLET | Freq: Two times a day (BID) | ORAL | Status: DC | PRN

## 2016-02-07 NOTE — Consult Note (Signed)
   [facility]   02/07/2016  Sheila Cisneros 10/11/1940 [id number]     [facility]. Chart reviewed. Noted current discharge plan is for  SNF.  There are no [facility] needs at this time. If patient's post hospital needs change, please place [facility]. For questions please contact:  Raiford Nobletika Thurl Boen, MSN-Ed, RN,BSN [facility] Hospital Liaison [phone]

## 2016-02-07 NOTE — Progress Notes (Signed)
Nutrition Brief Note  Patient identified on the Malnutrition Screening Tool (MST) Report  Wt Readings from Last 15 Encounters:  02/06/16 120 lb (54.4 kg)  12/01/15 112 lb (50.8 kg)  10/17/15 112 lb (50.8 kg)  08/11/15 109 lb 3.2 oz (49.5 kg)  06/13/15 106 lb 9.6 oz (48.4 kg)  06/09/15 111 lb (50.3 kg)  05/02/15 110 lb (49.9 kg)  04/06/15 105 lb (47.6 kg)  03/23/15 106 lb (48.1 kg)  02/10/15 102 lb 3.2 oz (46.4 kg)  08/11/14 99 lb 12.8 oz (45.3 kg)  07/14/14 102 lb (46.3 kg)  03/15/14 105 lb (47.6 kg)  02/01/14 101 lb 6.4 oz (46 kg)  11/20/13 111 lb (50.3 kg)    Body mass index is 21.95 kg/m. Patient meets criteria for normal weight based on current BMI. Question weight on admission as it is inconsistent with previous weight trends.   Current diet order is Heart Healthy/Carb Modified following swallow evaluation yesterday; diet was advanced from NPO. Skin WDL. Pt with hx of dementia and was admitted with AMS. Labs and medications reviewed.   Ensure Enlive has been ordered BID, each supplement provides 350 kcal and 20 grams of protein. No further nutrition interventions warranted at this time. If nutrition issues arise, please consult RD.     Sheila GammonJessica Delphine Sizemore, MS, RD, LDN Inpatient Clinical Dietitian Pager # [phone] After hours/weekend pager # [phone]

## 2016-02-07 NOTE — Care Management Note (Signed)
Case Management Note  Patient Details  Name: Sheila Cisneros MRN: [id number] Date of Birth: 09-Jan-1941  Subjective/Objective: 75 y/o f admitted w/HCAP. From home. PT-recc SNF. CSW following.Lethargy,dysphagia.                   Action/Plan:d/c plan SNF.   Expected Discharge Date:                  Expected Discharge Plan:  Skilled Nursing Facility  In-House Referral:  Clinical Social Work  Discharge planning Services  CM Consult  Post Acute Care Choice:    Choice offered to:     DME Arranged:    DME Agency:     HH Arranged:    HH Agency:     Status of Service:  In process, will continue to follow  If discussed at Long Length of Stay Meetings, dates discussed:    Additional Comments:  Lanier ClamMahabir, Aurilla Coulibaly, RN 02/07/2016, 3:38 PM

## 2016-02-07 NOTE — Progress Notes (Signed)
Speech Language Pathology Treatment: Dysphagia  Patient Details Name: Sheila Cisneros MRN: [id number] DOB: 01-29-1941 Today's Date: 02/07/2016 Time: [id number] SLP Time Calculation (min) (ACUTE ONLY): 20 min  Assessment / Plan / Recommendation Clinical Impression  Pt remains lethargic, less verbal today.  Needed physical assist to hold head upright and midline, tends to flex to right shoulder.  Assisted with thin liquids, purees with no overt s/s of aspiration, but with prolonged oral preparation and oral holding today, requiring min verbal cues to swallow.  Will downgrade diet to dysphagia 1, thin liquids.  Hold tray if not sufficiently alert or if coughing with PO intake.  SLP will follow for toleration, necessity of instrumental swallow study.  D/W RN.   HPI HPI: 75 y.o.femalewith medical history significant for osteoarthritis, COPD, bronchiectasis, chronic lower back pain, type 2 diabetes, GERD, gout, hypertension, hyperlipidemia, previous pneumonia episodes in the past who comes to the ED via EMS due progressively worse fatigue, congestion and fever. Chest radiograph shows worsening bibasilar infiltrates. Dx HCAP, COPD.  Prior notes from pulmonary medicine suggest intermittent aspiration as a possibility.  There are notes from PCP suggesting deterioration in cognition; last visit 12/04/15 with no concerns for cognition or depression.   Sister-in-law describes slow progressive changes in cognition.       SLP Plan  Continue with current plan of care     Recommendations  Diet recommendations: Dysphagia 1 (puree);Thin liquid Liquids provided via: Cup;Straw Medication Administration: Whole meds with puree Supervision: Full supervision/cueing for compensatory strategies;Staff to assist with self feeding Compensations: Minimize environmental distractions;Slow rate;Small sips/bites Postural Changes and/or Swallow Maneuvers: Seated upright 90 degrees                Oral Care Recommendations:  Oral care BID Follow up Recommendations:  (tba) Plan: Continue with current plan of care       GO               Sheila Colgan L. Samson Fredericouture, [place] CCC/SLP Pager [phone]  Sheila Cisneros, Sheila Cisneros 02/07/2016, 2:38 PM

## 2016-02-07 NOTE — Clinical Social Work Placement (Signed)
CSW met with patient & brother/sister-in-law, [name] at bedside - Patient has accepted a bed at [facility] confirmed with Suanne Marker at [facility] that they would be able to take patient when ready. CSW has completed FL2 & will continue to follow and assist with discharge when ready.    Raynaldo Opitz, [facility] Clinical Social Worker cell #: [phone]     CLINICAL SOCIAL WORK PLACEMENT  NOTE  Date:  02/07/2016  Patient Details  Name: Sheila Cisneros MRN: [id number] Date of Birth: 07-Feb-1941  Clinical Social Work is seeking post-discharge placement for this patient at the [place] level of care (*CSW will initial, date and re-position this form in  chart as items are completed):  Yes   Patient/family provided with [facility] [place] Work Department's list of facilities offering this level of care within the geographic area requested by the patient (or if unable, by the patient's family).  Yes   Patient/family informed of their freedom to choose among providers that offer the needed level of care, that participate in Medicare, Medicaid or managed care program needed by the patient, have an available bed and are willing to accept the patient.  Yes   Patient/family informed of 's ownership interest in [facility] and [facility], as well as of the fact that they are under no obligation to receive care at these facilities.  PASRR submitted to EDS on       PASRR number received on       Existing PASRR number confirmed on 02/07/16     FL2 transmitted to all facilities in geographic area requested by pt/family on 02/07/16     FL2 transmitted to all facilities within larger geographic area on       Patient informed that his/her managed care company has contracts with or will negotiate with certain facilities, including the following:        Yes   Patient/family informed of bed offers received.  Patient chooses  bed at [place] recommends and patient chooses bed at      Patient to be transferred to [facility] on  .  Patient to be transferred to facility by       Patient family notified on   of transfer.  Name of family member notified:        PHYSICIAN       Additional Comment:    _______________________________________________ Standley Brooking, LCSW 02/07/2016, 3:01 PM

## 2016-02-07 NOTE — Clinical Social Work Note (Signed)
Clinical Social Work Assessment  Patient Details  Name: Sheila Cisneros MRN: [id number] Date of Birth: [birth year]  Date of referral:  02/07/16               Reason for consult:  Facility Placement                Permission sought to share information with:  Facility Industrial/product designerContact Representative Permission granted to share information::  Yes, Verbal Permission Granted  Name::        Agency::     Relationship::     Contact Information:     Housing/Transportation Living arrangements for the past 2 months:  Single Family Home Source of Information:  Other (Comment Required) (patient's nephew, Acupuncturistcott) Patient Interpreter Needed:  None Criminal Activity/Legal Involvement Pertinent to Current Situation/Hospitalization:  No - Comment as needed Significant Relationships:  Other Family Members Lives with:  Self Do you feel safe going back to the place where you live?  No Need for family participation in patient care:  Yes (Comment)  Care giving concerns:  CSW reviewed PT evaluation recommending SNF at discharge.    Social Worker assessment / plan:  CSW spoke with patient's nephew, Lorin PicketScott (ph#: [phone]) & sister-in-law, Benjie Karvonenlice Hamilton (ph#: [phone]) re: discharge planning - both are in agreement with plan for SNF.   Employment status:  Retired Database administratornsurance information:  Managed Medicare PT Recommendations:  Skilled Nursing Facility Information / Referral to community resources:  Skilled Nursing Facility  Patient/Family's Response to care:  Patient's sister-in-law states that she had [facility] in the past, but would like to review the list again when she visits this afternoon.    Patient/Family's Understanding of and Emotional Response to Diagnosis, Current Treatment, and Prognosis:  Patient's nephew & sister-in-law concerned about her mental state/confusion.   Emotional Assessment Appearance:  Appears stated age Attitude/Demeanor/Rapport:    Affect (typically observed):     Orientation:  Oriented to Self, Oriented to Place Alcohol / Substance use:    Psych involvement (Current and /or in the community):     Discharge Needs  Concerns to be addressed:    Readmission within the last 30 days:    Current discharge risk:    Barriers to Discharge:      Arlyss RepressHarrison, Devion Chriscoe F, LCSW 02/07/2016, 11:16 AM

## 2016-02-07 NOTE — Clinical Social Work Placement (Signed)
   CLINICAL SOCIAL WORK PLACEMENT  NOTE  Date:  02/07/2016  Patient Details  Name: Sheila Cisneros MRN: [id number] Date of Birth: 11-20-1940  Clinical Social Work is seeking post-discharge placement for this patient at the Skilled  Nursing Facility level of care (*CSW will initial, date and re-position this form in  chart as items are completed):  Yes   Patient/family provided with [facility] Clinical Social Work Department's list of facilities offering this level of care within the geographic area requested by the patient (or if unable, by the patient's family).  Yes   Patient/family informed of their freedom to choose among providers that offer the needed level of care, that participate in Medicare, Medicaid or managed care program needed by the patient, have an available bed and are willing to accept the patient.  Yes   Patient/family informed of [facility]'s ownership interest in [facility] and [facility], as well as of the fact that they are under no obligation to receive care at these facilities.  PASRR submitted to EDS on       PASRR number received on       Existing PASRR number confirmed on 02/07/16     FL2 transmitted to all facilities in geographic area requested by pt/family on 02/07/16     FL2 transmitted to all facilities within larger geographic area on       Patient informed that his/her managed care company has contracts with or will negotiate with certain facilities, including the following:            Patient/family informed of bed offers received.  Patient chooses bed at       Physician recommends and patient chooses bed at      Patient to be transferred to   on  .  Patient to be transferred to facility by       Patient family notified on   of transfer.  Name of family member notified:        PHYSICIAN       Additional Comment:    _______________________________________________ Arlyss RepressHarrison, Syretta Kochel F, LCSW 02/07/2016, 11:20 AM

## 2016-02-07 NOTE — NC FL2 (Signed)
[place] MEDICAID FL2 LEVEL OF CARE SCREENING TOOL     IDENTIFICATION  Patient Name: Sheila Cisneros Birthdate: 1940/11/10 Sex: female Admission Date (Current Location): 02/05/2016  [facility] [place]   Facility and Address:  [facility],  [address], [place] [id number]      Provider Number: [id number]  Attending Physician Name and Address:  Renae FickleMackenzie Short, MD  Relative Name and Phone Number:       Current Level of Care: Hospital Recommended Level of Care: Skilled Nursing Facility Prior Approval Number:    Date Approved/Denied:   PASRR Number: [id number]  Discharge Plan: SNF    Current Diagnoses: Patient Active Problem List   Diagnosis Date Noted  . HCAP (healthcare-associated pneumonia) 02/05/2016  . Type 2 diabetes mellitus (HCC) 02/05/2016  . Bronchiectasis (HCC) 06/18/2015  . Insomnia due to anxiety and fear 06/09/2015  . Hearing loss 05/02/2015  . Mass of lower lobe of left lung 04/06/2015  . COPD (chronic obstructive pulmonary disease) (HCC) 03/23/2015  . Routine general medical examination at a health care facility 08/04/2013  . Other screening mammogram 08/04/2013  . Duodenal ulcer 08/03/2013  . Hypokalemia 08/03/2013  . Hyperlipidemia with target LDL less than 100   . Type II diabetes mellitus with manifestations (HCC) 06/21/2013  . Scoliosis (and kyphoscoliosis), idiopathic 12/12/2012  . Dementia without behavioral disturbance 12/10/2012  . GERD (gastroesophageal reflux disease) 12/09/2012  . GOUT 03/07/2007  . Essential hypertension 03/07/2007    Orientation RESPIRATION BLADDER Height & Weight     Self, Place  O2 (2L) Incontinent Weight: 120 lb (54.4 kg) Height:  5\' 2"  (157.5 cm)  BEHAVIORAL SYMPTOMS/MOOD NEUROLOGICAL BOWEL NUTRITION STATUS      Continent Diet (Heart Healthy/Carb Modified)  AMBULATORY STATUS COMMUNICATION OF NEEDS Skin   Extensive Assist Verbally Normal                        Personal Care Assistance Level of Assistance  Bathing, Dressing Bathing Assistance: Limited assistance   Dressing Assistance: Limited assistance     Functional Limitations Info             SPECIAL CARE FACTORS FREQUENCY  PT (By licensed PT), OT (By licensed OT)     PT Frequency: 5 OT Frequency: 5            Contractures      Additional Factors Info  Code Status, Allergies Code Status Info: Fullcode Allergies Info: Codeine, Neomycin, Penicillins, Tetanus Toxoids, Levofloxacin           Current Medications (02/07/2016):  This is the current hospital active medication list Current Facility-Administered Medications  Medication Dose Route Frequency Provider Last Rate Last Dose  . acetaminophen (TYLENOL) tablet 650 mg  650 mg Oral Q6H PRN Bobette Moavid Manuel Ortiz, MD       Or  . acetaminophen (TYLENOL) suppository 650 mg  650 mg Rectal Q6H PRN Bobette Moavid Manuel Ortiz, MD      . albuterol (PROVENTIL) (2.5 MG/3ML) 0.083% nebulizer solution 2.5 mg  2.5 mg Nebulization Q4H PRN Bobette Moavid Manuel Ortiz, MD      . atorvastatin (LIPITOR) tablet 10 mg  10 mg Oral QHS Danford Badrew A Wofford, RPH   10 mg at 02/06/16 2337  . ceFEPIme (MAXIPIME) 1 g in dextrose 5 % 50 mL IVPB  1 g Intravenous BID Danford Badrew A Wofford, RPH   1 g at 02/07/16 1103  . donepezil (ARICEPT) tablet 5 mg  5  mg Oral QHS Bobette Mo, MD   5 mg at 02/06/16 2335  . enoxaparin (LOVENOX) injection 40 mg  40 mg Subcutaneous QHS Bobette Mo, MD   40 mg at 02/06/16 2328  . feeding supplement (ENSURE ENLIVE) (ENSURE ENLIVE) liquid 237 mL  237 mL Oral BID BM Bobette Mo, MD   237 mL at 02/07/16 1103  . insulin aspart (novoLOG) injection 0-9 Units  0-9 Units Subcutaneous TID WC Bobette Mo, MD   1 Units at 02/06/16 1237  . ipratropium-albuterol (DUONEB) 0.5-2.5 (3) MG/3ML nebulizer solution 3 mL  3 mL Nebulization TID Renae Fickle, MD   3 mL at 02/07/16 0820  . ketorolac (TORADOL) 15 MG/ML injection 15 mg  15 mg  Intravenous Q6H PRN Bobette Mo, MD      . loratadine (CLARITIN) tablet 10 mg  10 mg Oral Daily Bobette Mo, MD   10 mg at 02/07/16 1103  . multivitamin with minerals tablet 1 tablet  1 tablet Oral Daily Bobette Mo, MD   1 tablet at 02/07/16 1103  . ondansetron (ZOFRAN) tablet 4 mg  4 mg Oral Q6H PRN Bobette Mo, MD       Or  . ondansetron [facility]) injection 4 mg  4 mg Intravenous Q6H PRN Bobette Mo, MD      . pantoprazole (PROTONIX) EC tablet 40 mg  40 mg Oral Daily Bobette Mo, MD   40 mg at 02/07/16 1103  . sodium chloride flush (NS) 0.9 % injection 3 mL  3 mL Intravenous Q12H Bobette Mo, MD   3 mL at 02/06/16 1000  . traZODone (DESYREL) tablet 50 mg  50 mg Oral QHS Bobette Mo, MD   50 mg at 02/06/16 2337  . vancomycin (VANCOCIN) IVPB 750 mg/150 ml premix  750 mg Intravenous Q24H Rolan Bucco, MD   750 mg at 02/07/16 0014  . venlafaxine (EFFEXOR) tablet 37.5 mg  37.5 mg Oral Daily PRN Bobette Mo, MD      . verapamil (CALAN-SR) CR tablet 240 mg  240 mg Oral QHS Bobette Mo, MD   240 mg at 02/06/16 2331     Discharge Medications: Please see discharge summary for a list of discharge medications.  Relevant Imaging Results:  Relevant Lab Results:   Additional Information SSN: [id number]  Arlyss Repress, LCSW

## 2016-02-07 NOTE — Progress Notes (Addendum)
PROGRESS NOTE  Sheila Cisneros  ZOX:[id number] DOB: March 05, 1941 DOA: 02/05/2016 PCP: Sanda Linger, MD  Brief Narrative:   Sheila Cisneros is a 75 y.o. female with osteoarthritis, COPD, bronchiectasis, chronic lower back pain, type 2 diabetes, GERD, gout, hypertension, hyperlipidemia, previous pneumonia episodes in the past who comes to the ED via EMS due progressively worse fatigue, congestion and fever.   Temperature of 101.60F in the emergency department.  WBC was 14.4, potassium level was 2.7 mM/ML,  lactic acid was 2.25 mmol/L.  Chest radiograph showed worsening bibasilar infiltrates. Left wrist x-ray showed moderate soft tissue swelling with no definite acute osseous abnormality.  She was started on broad spectrum antibiotics and IVF.  Her fevers and leukocytosis have improved.  Her cultures are negative.  She remains lethargic today, but should be ready for discharge on oral antibiotics once encephalopathy improves some.     Assessment & Plan:   Principal Problem:   HCAP (healthcare-associated pneumonia) Active Problems:   Essential hypertension   GERD (gastroesophageal reflux disease)   Dementia without behavioral disturbance   Type II diabetes mellitus with manifestations (HCC)   Hyperlipidemia with target LDL less than 100   Hypokalemia   COPD (chronic obstructive pulmonary disease) (HCC)   Insomnia due to anxiety and fear   Type 2 diabetes mellitus (HCC)  Acute respiratory failure with hypoxia secondary to HCAP (healthcare-associated pneumonia)   COPD (chronic obstructive pulmonary disease) (HCC)   History of bronchiectasis. Continue duonebs Continue cefepime D/c vancomycin as MRSA PCR negative Speech therapy:  Downgraded recommendation today due to lethargy Blood cultures NGTD strep pneumo neg  Left wrist cellulitis -  Improving on antibiotics above -  Doubt septic joint due to preserved ROM of wrist.   Acute metabolic encephalopathy related to pneumonia complicated  by underlying dementia -  D/c sedating medications including trazodone and claritin -  Not on benzos or opiates -  Continue treatment for underlying infection  Active Problems:   Essential hypertension, blood pressures dropping some Continue verapamil 240 mg daily Holding HCTZ    GERD (gastroesophageal reflux disease) with likely aspiration -  D/c PPI -  Use prn H2 blocker for symptomatic GERD    Type 2 diabetes mellitus (HCC), CBG stable, A1c 6.4  Carbohydrate modified diet. D/c SSI and manage with diet only     Hyperlipidemia, given poor functional status and dementia, unlikely to benefit from ongoing statin  -  D/c statin    Hypokalemia, hypomagnesemia, and hypophosphatemia, improving with IV supplementation -  Additional IV magnesium today - repeat K, Magnesium, and phos in AM    Insomnia due to anxiety and fear -  D/c trazodone 50 mg by mouth at bedtime due to sedation/lethargy today    Dementia without behavioral disturbance. Continue Aricept 5 mg by mouth at bedtime. Continue Effexor 37.5 mg by mouth daily Palliative care consult at SNF   DVT prophylaxis: Lovenox SQ. Code Status: Full code. Family Communication:  patient alone, no family at bedside.  Attempted to call brother Tomasa Blase, but he did not pick up. Disposition Plan:  okay to discharge to SNF once more alert and able to tolerate food/medications more reliably.  If she remains lethargic, consider palliative care consult.    Consultants:   none  Procedures:  none  Antimicrobials:  Anti-infectives    Start     Dose/Rate Route Frequency Ordered Stop   02/06/16 2200  vancomycin (VANCOCIN) IVPB 750 mg/150 ml premix     750 mg  150 mL/hr over 60 Minutes Intravenous Every 24 hours 02/05/16 2002     02/06/16 1230  ceFEPIme (MAXIPIME) 1 g in dextrose 5 % 50 mL IVPB     1 g 100 mL/hr over 30 Minutes Intravenous 2 times daily 02/06/16 1200     02/06/16 0600  aztreonam (AZACTAM) 1 g in dextrose 5 % 50  mL IVPB  Status:  Discontinued     1 g 100 mL/hr over 30 Minutes Intravenous Every 8 hours 02/05/16 2002 02/06/16 1114   02/06/16 0600  clindamycin (CLEOCIN) IVPB 600 mg  Status:  Discontinued     600 mg 100 mL/hr over 30 Minutes Intravenous Every 8 hours 02/05/16 2002 02/06/16 1114   02/05/16 1930  clindamycin (CLEOCIN) IVPB 600 mg     600 mg 100 mL/hr over 30 Minutes Intravenous  Once 02/05/16 1916 02/05/16 2212   02/05/16 1930  aztreonam (AZACTAM) 2 g in dextrose 5 % 50 mL IVPB     2 g 100 mL/hr over 30 Minutes Intravenous  Once 02/05/16 1916 02/05/16 2128   02/05/16 1930  vancomycin (VANCOCIN) IVPB 1000 mg/200 mL premix     1,000 mg 200 mL/hr over 60 Minutes Intravenous  Once 02/05/16 1916 02/05/16 2312        Subjective:  C/o abdominal pain, but unable to describe or show where.  Still has cough.  Does not answer my other questions today.    Objective: Vitals:   02/07/16 0800 02/07/16 0821 02/07/16 1437 02/07/16 1451  BP: 140/73   (!) 128/58  Pulse: 75   100  Resp: 19   19  Temp: 99.4 F (37.4 C)   98.3 F (36.8 C)  TempSrc: Oral   Oral  SpO2: 100% 100% 94% 98%  Weight:      Height:        Intake/Output Summary (Last 24 hours) at 02/07/16 1538 Last data filed at 02/07/16 1451  Gross per 24 hour  Intake          2245.83 ml  Output                0 ml  Net          2245.83 ml   Filed Weights   02/05/16 2007 02/06/16 0645  Weight: 49.9 kg (110 lb) 54.4 kg (120 lb)    Examination:  General exam:  Adult female, thin and slumped to the side of the bed, sleeping but arouses to loud voice.  No acute distress.  HEENT:  NCAT, MMM Respiratory system:  rhonchorous bilateral BS, scattered rales, wheezing has improved.   Cardiovascular system: Regular rate and rhythm, normal S1/S2. No murmurs, rubs, gallops or clicks.  Warm extremities Gastrointestinal system: Normal active bowel sounds, soft, nondistended, nontender. MSK:  Decreased tone and bulk, no lower extremity  edema.  Left wrist swelling and redness have improved.  Trace edema of left wrist.  ROM intact left wrist Neuro:  Grossly moves all extremities but diffusely weak. Psych:  Difficult to arouse and fell asleep during my visit.  Tried to point to stomach when I asked her to clarify location of abdominal pain and only very slowly was she able to lift one arm off the bed a few inches and move it in the general direction of her belly before she fell asleep again.      Data Reviewed: I have personally reviewed following labs and imaging studies  CBC:  Recent Labs Lab 02/05/16 1818 02/06/16 0228 02/07/16 0534  WBC 14.4* 10.7* 9.6  NEUTROABS  --  7.7  --   HGB 12.2 10.4* 10.3*  HCT 36.8 30.7* 31.1*  MCV 87.2 85.5 86.4  PLT 265 206 243   Basic Metabolic Panel:  Recent Labs Lab 02/05/16 1818 02/05/16 2338 02/06/16 0224 02/06/16 1406 02/07/16 0534  NA 145  --  143  --  141  K 2.7*  --  2.9* 3.4* 3.6  CL 108  --  113*  --  112*  CO2 27  --  22  --  24  GLUCOSE 265*  --  246*  --  123*  BUN 20  --  17  --  10  CREATININE 1.15*  --  1.03*  --  0.75  CALCIUM 9.1  --  7.9*  --  8.3*  MG  --  1.4*  --   --  1.7  PHOS  --  2.3*  --   --   --    GFR: Estimated Creatinine Clearance: 48.8 mL/min (by C-G formula based on SCr of 0.75 mg/dL). Liver Function Tests:  Recent Labs Lab 02/05/16 1818 02/06/16 0224  AST 25 22  ALT 16 12*  ALKPHOS 119 90  BILITOT 0.5 0.5  PROT 7.7 6.1*  ALBUMIN 3.1* 2.4*   No results for input(s): LIPASE, AMYLASE in the last 168 hours. No results for input(s): AMMONIA in the last 168 hours. Coagulation Profile: No results for input(s): INR, PROTIME in the last 168 hours. Cardiac Enzymes: No results for input(s): CKTOTAL, CKMB, CKMBINDEX, TROPONINI in the last 168 hours. BNP (last 3 results) No results for input(s): PROBNP in the last 8760 hours. HbA1C:  Recent Labs  02/06/16 0811  HGBA1C 6.4*   CBG:  Recent Labs Lab 02/06/16 1226  02/06/16 1715 02/06/16 2231 02/07/16 0802 02/07/16 1139  GLUCAP 133* 129* 113* 118* 131*   Lipid Profile: No results for input(s): CHOL, HDL, LDLCALC, TRIG, CHOLHDL, LDLDIRECT in the last 72 hours. Thyroid Function Tests: No results for input(s): TSH, T4TOTAL, FREET4, T3FREE, THYROIDAB in the last 72 hours. Anemia Panel: No results for input(s): VITAMINB12, FOLATE, FERRITIN, TIBC, IRON, RETICCTPCT in the last 72 hours. Urine analysis:    Component Value Date/Time   COLORURINE YELLOW 02/05/2016 2043   APPEARANCEUR CLEAR 02/05/2016 2043   LABSPEC 1.029 02/05/2016 2043   PHURINE 6.5 02/05/2016 2043   GLUCOSEU >1000 (A) 02/05/2016 2043   GLUCOSEU NEGATIVE 03/23/2015 1432   HGBUR NEGATIVE 02/05/2016 2043   BILIRUBINUR NEGATIVE 02/05/2016 2043   KETONESUR NEGATIVE 02/05/2016 2043   PROTEINUR 100 (A) 02/05/2016 2043   UROBILINOGEN 0.2 03/23/2015 1432   NITRITE NEGATIVE 02/05/2016 2043   LEUKOCYTESUR NEGATIVE 02/05/2016 2043   Sepsis Labs: @LABRCNTIP (procalcitonin:4,lacticidven:4)  ) Recent Results (from the past 240 hour(s))  Blood Culture (routine x 2)     Status: None (Preliminary result)   Collection Time: 02/05/16  6:43 PM  Result Value Ref Range Status   Specimen Description BLOOD RIGHT ANTECUBITAL  Final   Special Requests BOTTLES DRAWN AEROBIC AND ANAEROBIC EA  Final   Culture   Final    NO GROWTH 1 DAY Performed at [facility]    Report Status PENDING  Incomplete  Blood Culture (routine x 2)     Status: None (Preliminary result)   Collection Time: 02/05/16  6:51 PM  Result Value Ref Range Status   Specimen Description BLOOD BLOOD RIGHT FOREARM  Final   Special Requests IN PEDIATRIC BOTTLE  Final   Culture  Final    NO GROWTH 1 DAY Performed at [facility]    Report Status PENDING  Incomplete  Urine culture     Status: None   Collection Time: 02/05/16  8:43 PM  Result Value Ref Range Status   Specimen Description URINE, RANDOM  Final    Special Requests NONE  Final   Culture NO GROWTH Performed at [facility]   Final   Report Status 02/07/2016 FINAL  Final  MRSA PCR Screening     Status: None   Collection Time: 02/06/16 11:15 AM  Result Value Ref Range Status   MRSA by PCR NEGATIVE NEGATIVE Final    Comment:        The GeneXpert MRSA Assay (FDA approved for NASAL specimens only), is one component of a comprehensive MRSA colonization surveillance program. It is not intended to diagnose MRSA infection nor to guide or monitor treatment for MRSA infections.       Radiology Studies: Dg Chest 2 View  Result Date: 02/05/2016 CLINICAL DATA:  Increased lethargy, chest congestion, and fever. EXAM: CHEST  2 VIEW COMPARISON:  10/17/2015 FINDINGS: Kyphotic angulation on the AP radiograph with the cardiac silhouette partially obscured. A hiatal hernia is again seen. Lung volumes are diminished compared to the prior study with persistent bibasilar opacities, left greater than right and mildly increased from the prior study. No sizable pleural effusion or pneumothorax is identified. Thoracolumbar scoliosis is again noted. IMPRESSION: 1. Chronic bibasilar opacities which have mildly increased from the prior study and may reflect a combination of atelectasis and chronic aspiration. Superimposed infectious pneumonia is also possible. 2. Hiatal hernia. Electronically Signed   By: Sebastian Ache M.D.   On: 02/05/2016 20:24   Dg Wrist Complete Left  Result Date: 02/05/2016 CLINICAL DATA:  Increased lethargy left wrist swollen and hot to touch EXAM: LEFT WRIST - COMPLETE 3+ VIEW COMPARISON:  None. FINDINGS: Moderate diffuse soft tissue swelling. No radiopaque foreign body. Bones appear osteopenic. No obvious fracture lucency however exam limited by osteopenia. Degenerative changes at the base of the thumb. IMPRESSION: 1. Moderate soft tissue swelling. No definite acute osseous abnormality allowing for osteopenia. 2. If soft tissue  or joint infection is suspected, MRI may be obtained for further evaluation. Electronically Signed   By: Jasmine Pang M.D.   On: 02/05/2016 20:25   Ct Head Wo Contrast  Result Date: 02/05/2016 CLINICAL DATA:  Unwitnessed fall today.  Neck pain. EXAM: CT HEAD WITHOUT CONTRAST CT CERVICAL SPINE WITHOUT CONTRAST TECHNIQUE: Multidetector CT imaging of the head and cervical spine was performed following the standard protocol without intravenous contrast. Multiplanar CT image reconstructions of the cervical spine were also generated. COMPARISON:  06/21/2013 and 12/10/2012 FINDINGS: CT HEAD FINDINGS Brain: 6 mm hypodense lesion in the right anterior thalamus compatible with remote lacunar infarct. Suspected small remote lacunar infarcts in the lentiform nuclei and involving the caudate heads. Periventricular white matter and corona radiata hypodensities favor chronic ischemic microvascular white matter disease. Otherwise, The brainstem, cerebellum, cerebral peduncles, thalami, basal ganglia, basilar cisterns, and ventricular system appear within normal limits. No intracranial hemorrhage, mass lesion, or acute CVA. Vascular: Unremarkable Skull: Unremarkable Sinuses/Orbits: Mild chronic right maxillary sinusitis. Partial left ethmoidectomy with left maxillary antrostomy. Small osteoma along the left ethmoid air cells. Chronic ethmoid and bilateral frontal sinusitis. Other: No supplemental non-categorized findings. CT CERVICAL SPINE FINDINGS Alignment: The neck is significantly rotated during imaging and this could not be corrected. 1.5 mm anterolisthesis at C4-5 and 1.5  mm retrolisthesis at C5-6. There is 2 mm degenerative anterolisthesis at C7-T1. Skull base and vertebrae: Degenerative spurring and loss of articular space at the anterior C1-2 articulation. Small amount of pannus posterior to the odontoid. Loss of disc height and endplate sclerosis at C5-6 and C6-7. No fracture is identified. There is some mild  prominence the . prevertebral soft tissues. Chronic sphenoid sinusitis. Soft tissues and spinal canal: Unremarkable Disc levels: Uncinate spurring on the left at C5-6 and C6-7, contributing to mild osseous foraminal narrowing. Upper chest: Unremarkable Other: No supplemental non-categorized findings. IMPRESSION: 1. Right the line make an basal ganglia remote lacunar infarcts without acute intracranial findings. 2. Periventricular white matter and corona radiata hypodensities favor chronic ischemic microvascular white matter disease. 3. Chronic maxillary and sphenoid sinusitis along with chronic ethmoid sinusitis. Prior left ethmoid surgery. Chronic frontal sinusitis. 4. Cervical spondylosis and degenerative disc disease without fracture or subluxation identified. Suboptimal positioning during scanning of the cervical spine due to twisting of the neck which the patient was not able to correct. 5. Mild osseous foraminal stenosis on the left at C5-6 and C6-7 due to uncinate spurring. Electronically Signed   By: Gaylyn Rong M.D.   On: 02/05/2016 20:02   Ct Cervical Spine Wo Contrast  Result Date: 02/05/2016 CLINICAL DATA:  Unwitnessed fall today.  Neck pain. EXAM: CT HEAD WITHOUT CONTRAST CT CERVICAL SPINE WITHOUT CONTRAST TECHNIQUE: Multidetector CT imaging of the head and cervical spine was performed following the standard protocol without intravenous contrast. Multiplanar CT image reconstructions of the cervical spine were also generated. COMPARISON:  06/21/2013 and 12/10/2012 FINDINGS: CT HEAD FINDINGS Brain: 6 mm hypodense lesion in the right anterior thalamus compatible with remote lacunar infarct. Suspected small remote lacunar infarcts in the lentiform nuclei and involving the caudate heads. Periventricular white matter and corona radiata hypodensities favor chronic ischemic microvascular white matter disease. Otherwise, The brainstem, cerebellum, cerebral peduncles, thalami, basal ganglia, basilar  cisterns, and ventricular system appear within normal limits. No intracranial hemorrhage, mass lesion, or acute CVA. Vascular: Unremarkable Skull: Unremarkable Sinuses/Orbits: Mild chronic right maxillary sinusitis. Partial left ethmoidectomy with left maxillary antrostomy. Small osteoma along the left ethmoid air cells. Chronic ethmoid and bilateral frontal sinusitis. Other: No supplemental non-categorized findings. CT CERVICAL SPINE FINDINGS Alignment: The neck is significantly rotated during imaging and this could not be corrected. 1.5 mm anterolisthesis at C4-5 and 1.5 mm retrolisthesis at C5-6. There is 2 mm degenerative anterolisthesis at C7-T1. Skull base and vertebrae: Degenerative spurring and loss of articular space at the anterior C1-2 articulation. Small amount of pannus posterior to the odontoid. Loss of disc height and endplate sclerosis at C5-6 and C6-7. No fracture is identified. There is some mild prominence the . prevertebral soft tissues. Chronic sphenoid sinusitis. Soft tissues and spinal canal: Unremarkable Disc levels: Uncinate spurring on the left at C5-6 and C6-7, contributing to mild osseous foraminal narrowing. Upper chest: Unremarkable Other: No supplemental non-categorized findings. IMPRESSION: 1. Right the line make an basal ganglia remote lacunar infarcts without acute intracranial findings. 2. Periventricular white matter and corona radiata hypodensities favor chronic ischemic microvascular white matter disease. 3. Chronic maxillary and sphenoid sinusitis along with chronic ethmoid sinusitis. Prior left ethmoid surgery. Chronic frontal sinusitis. 4. Cervical spondylosis and degenerative disc disease without fracture or subluxation identified. Suboptimal positioning during scanning of the cervical spine due to twisting of the neck which the patient was not able to correct. 5. Mild osseous foraminal stenosis on the left at C5-6 and C6-7 due  to uncinate spurring. Electronically Signed    By: Gaylyn Rong M.D.   On: 02/05/2016 20:02     Scheduled Meds: . atorvastatin  10 mg Oral QHS  . ceFEPime (MAXIPIME) IV  1 g Intravenous BID  . donepezil  5 mg Oral QHS  . enoxaparin (LOVENOX) injection  40 mg Subcutaneous QHS  . feeding supplement (ENSURE ENLIVE)  237 mL Oral BID BM  . insulin aspart  0-9 Units Subcutaneous TID WC  . ipratropium-albuterol  3 mL Nebulization TID  . loratadine  10 mg Oral Daily  . multivitamin with minerals  1 tablet Oral Daily  . pantoprazole  40 mg Oral Daily  . sodium chloride flush  3 mL Intravenous Q12H  . traZODone  50 mg Oral QHS  . vancomycin  750 mg Intravenous Q24H  . verapamil  240 mg Oral QHS   Continuous Infusions:     LOS: 2 days    Time spent: 30 min    Renae Fickle, MD [facility] Pager [phone]  If 7PM-7AM, please contact night-coverage [email or web address] Password [facility] 02/07/2016, 3:38 PM

## 2016-02-08 DIAGNOSIS — E876 Hypokalemia: Secondary | ICD-10-CM

## 2016-02-08 DIAGNOSIS — J189 Pneumonia, unspecified organism: Secondary | ICD-10-CM

## 2016-02-08 DIAGNOSIS — J42 Unspecified chronic bronchitis: Secondary | ICD-10-CM

## 2016-02-08 DIAGNOSIS — K219 Gastro-esophageal reflux disease without esophagitis: Secondary | ICD-10-CM

## 2016-02-08 DIAGNOSIS — G309 Alzheimer's disease, unspecified: Secondary | ICD-10-CM

## 2016-02-08 DIAGNOSIS — F0280 Dementia in other diseases classified elsewhere without behavioral disturbance: Secondary | ICD-10-CM

## 2016-02-08 DIAGNOSIS — F409 Phobic anxiety disorder, unspecified: Secondary | ICD-10-CM

## 2016-02-08 DIAGNOSIS — F5105 Insomnia due to other mental disorder: Secondary | ICD-10-CM

## 2016-02-08 DIAGNOSIS — I10 Essential (primary) hypertension: Secondary | ICD-10-CM

## 2016-02-08 DIAGNOSIS — E1165 Type 2 diabetes mellitus with hyperglycemia: Secondary | ICD-10-CM

## 2016-02-08 DIAGNOSIS — E785 Hyperlipidemia, unspecified: Secondary | ICD-10-CM

## 2016-02-08 LAB — RENAL FUNCTION PANEL
ALBUMIN: 2.5 g/dL — AB (ref 3.5–5.0)
ANION GAP: 7 (ref 5–15)
BUN: 13 mg/dL (ref 6–20)
CALCIUM: 8.8 mg/dL — AB (ref 8.9–10.3)
CO2: 25 mmol/L (ref 22–32)
Chloride: 107 mmol/L (ref 101–111)
Creatinine, Ser: 0.78 mg/dL (ref 0.44–1.00)
GFR calc Af Amer: >60 mL/min (ref >60)
Glucose, Bld: 148 mg/dL — ABNORMAL HIGH (ref 65–99)
PHOSPHORUS: 3.1 mg/dL (ref 2.5–4.6)
POTASSIUM: 3.2 mmol/L — AB (ref 3.5–5.1)
SODIUM: 139 mmol/L (ref 135–145)

## 2016-02-08 LAB — CBC
HEMATOCRIT: 32.4 % — AB (ref 36.0–46.0)
HEMOGLOBIN: 10.8 g/dL — AB (ref 12.0–15.0)
MCH: 28.6 pg (ref 26.0–34.0)
MCHC: 33.3 g/dL (ref 30.0–36.0)
MCV: 85.9 fL (ref 78.0–100.0)
Platelets: 281 10*3/uL (ref 150–400)
RBC: 3.77 MIL/uL — AB (ref 3.87–5.11)
RDW: 14.1 % (ref 11.5–15.5)
WBC: 12.1 10*3/uL — AB (ref 4.0–10.5)

## 2016-02-08 LAB — GLUCOSE, CAPILLARY
GLUCOSE-CAPILLARY: 140 mg/dL — AB (ref 65–99)
Glucose-Capillary: 150 mg/dL — ABNORMAL HIGH (ref 65–99)

## 2016-02-08 LAB — MAGNESIUM: Magnesium: 1.7 mg/dL (ref 1.7–2.4)

## 2016-02-08 MED ORDER — POTASSIUM CHLORIDE 20 MEQ/15ML (10%) PO SOLN
40.0000 meq | Freq: Two times a day (BID) | ORAL | Status: DC
  Administered 2016-02-08 – 2016-02-09 (×2): 40 meq via ORAL
  Filled 2016-02-08 (×3): qty 30

## 2016-02-08 MED ORDER — IPRATROPIUM-ALBUTEROL 0.5-2.5 (3) MG/3ML IN SOLN
3.0000 mL | Freq: Two times a day (BID) | RESPIRATORY_TRACT | Status: DC
  Administered 2016-02-08 – 2016-02-09 (×2): 3 mL via RESPIRATORY_TRACT
  Filled 2016-02-08 (×2): qty 3

## 2016-02-08 MED ORDER — SODIUM CHLORIDE 0.9 % IV SOLN
3.0000 g | Freq: Four times a day (QID) | INTRAVENOUS | Status: DC
  Administered 2016-02-08 – 2016-02-09 (×5): 3 g via INTRAVENOUS
  Filled 2016-02-08 (×7): qty 3

## 2016-02-08 NOTE — Progress Notes (Signed)
Physical Therapy Treatment Patient Details Name: Sheila Cisneros MRN: [id number] DOB: [birth year] Today's Date: 02/08/2016    History of Present Illness 75 yo female admitted with Pna. Hx of HTN, COPD, OA, DM, chronic pain, gout.     PT Comments    Some improvement in alertness and command following compared to last session. Mod multimodal cues for tasks. Increased time required as well. Pt fatigues quickly. Continue to recommend SNF.   Follow Up Recommendations  SNF     Equipment Recommendations       Recommendations for Other Services       Precautions / Restrictions Precautions Precautions: Fall Restrictions Weight Bearing Restrictions: No    Mobility  Bed Mobility               General bed mobility comments: oob in recliner  Transfers Overall transfer level: Needs assistance   Transfers: Squat Pivot Transfers     Squat pivot transfers: Mod assist     General transfer comment: Squat pivot x2, recliner <>bsc. Increased time. Multimodal cues for safety, technique.   Ambulation/Gait             General Gait Details: NT-pt not safely able to attempt just yet   Stairs            Wheelchair Mobility    Modified Rankin (Stroke Patients Only)       Balance Overall balance assessment: Needs assistance           Standing balance-Leahy Scale: Poor                      Cognition Arousal/Alertness: Lethargic   Overall Cognitive Status: Difficult to assess                      Exercises      General Comments        Pertinent Vitals/Pain Faces Pain Scale: No hurt    Home Living                      Prior Function            PT Goals (current goals can now be found in the care plan section) Progress towards PT goals: Progressing toward goals (slowly)    Frequency    Min 3X/week      PT Plan Current plan remains appropriate    Co-evaluation             End of Session Equipment  Utilized During Treatment: Gait belt Activity Tolerance: Patient limited by fatigue;Patient limited by lethargy Patient left: in chair;with call bell/phone within reach;with chair alarm set     Time: [id number] PT Time Calculation (min) (ACUTE ONLY): 15 min  Charges:  $Gait Training: 8-22 mins                    G Codes:      Rebeca AlertJannie Yeira Gulden, MPT Pager: [phone]

## 2016-02-08 NOTE — Progress Notes (Signed)
Speech Language Pathology Treatment: Dysphagia  Patient Details Name: Sheila Cisneros MRN: [id number] DOB: [birth year] Today's Date: 02/08/2016 Time: [id number] SLP Time Calculation (min) (ACUTE ONLY): 21 min  Assessment / Plan / Recommendation Clinical Impression  Pt sitting upright in chair today, alert and denies pain.  [name]  Inconsistent information provided to this SLP from patient with her stating she has occasional sensation of food/drink lodging in esophagus, then later reports only in throat and again later pt denies dysphagia.   Her cognitive deficits impact her historical abilities regarding dysphagia.    Of note, pt did have MBS and esophagram in 2003 due to concern for dysphagia - both tests negative.  Endoscopy conducted 2015 per chart review due to UGIB - no results located.  Pt with h/o recurrent possible pneumonias for many years.    Suspect multi-factorial dysphagia due to lethargy, dementia impacting oropharyngeal swallow and hiatal hernia, kyphoscoliosis impacting esophageal clearance.   Weak congested cough noted observed with intake - not observed at rest - uncertain if exertion or dysphagia related.  Informed pt to dysphagia mitigation strategies including recommendation to continue dys1/thin diet for now.     She may benefit from consideration for esophagram given h/o GERD, Hiatal hernia and kyphosis to assure esophageal deficits not observed and/or MBS to allow oropharyngeal assessment.    NT reports pt is not coughing with intake but has congested cough.Marland Kitchen.        HPI HPI: 75 y.o.femalewith medical history significant for osteoarthritis, COPD, bronchiectasis, chronic lower back pain, type 2 diabetes, GERD, gout, hypertension, hyperlipidemia, previous pneumonia episodes in the past who comes to the ED via EMS due progressively worse fatigue, congestion and fever. Chest radiograph shows worsening bibasilar infiltrates. Dx HCAP, COPD.  Prior notes  from pulmonary medicine suggest intermittent aspiration as a possibility.  There are notes from PCP suggesting deterioration in cognition; last visit 12/04/15 with no concerns for cognition or depression.   Sister-in-law describes slow progressive changes in cognition.       SLP Plan  Continue with current plan of care;Other (Comment) (consider instrumental testing)     Recommendations  Diet recommendations: Dysphagia 1 (puree);Thin liquid Liquids provided via: Cup;Straw Medication Administration: Whole meds with puree Supervision: Full supervision/cueing for compensatory strategies;Staff to assist with self feeding Compensations: Minimize environmental distractions;Slow rate;Small sips/bites Postural Changes and/or Swallow Maneuvers: Seated upright 90 degrees;Upright 30-60 min after meal                Oral Care Recommendations: Oral care BID Follow up Recommendations:  (tba) Plan: Continue with current plan of care;Other (Comment) (consider instrumental testing)       GO               Sheila Burnetamara Avianna Moynahan, MS [facility] SLP [phone]

## 2016-02-08 NOTE — Progress Notes (Signed)
Pharmacy Antibiotic Note  Sheila Cisneros is a 75 y.o. female admitted on 02/05/2016 with AMS and sepsis of unknown origin.  Pharmacy was initially consulted for Vancomycin / Aztreonam dosing (penicillin allergy) + MD dosing IV clindamycin.  She was transitioned to Vanc and Cefepime, then narrowed to Cefepime alone.  On 10/24, she became more lethargic with possible aspiration.  Today, 10/25, WBC increased.  Pharmacy has been consulted to change from Cefepime to Unasyn to better cover possible aspiration pneumonia and also left wrist/hand cellulitis/swelling.  Plan:  Unasyn 3g IV q6h  F/u renal function, cultures, clinical course   Height: 5\' 2"  (157.5 cm) Weight: 120 lb (54.4 kg) IBW/kg (Calculated) : 50.1  Temp (24hrs), Avg:98.4 F (36.9 C), Min:98.3 F (36.8 C), Max:98.7 F (37.1 C)   Recent Labs Lab 02/05/16 1818 02/05/16 1851 02/05/16 2338 02/06/16 0224 02/06/16 0228 02/06/16 0811 02/07/16 0534 02/08/16 0523  WBC 14.4*  --   --   --  10.7*  --  9.6 12.1*  CREATININE 1.15*  --   --  1.03*  --   --  0.75 0.78  LATICACIDVEN  --  2.25* 1.3 2.8*  --  1.3  --   --     Estimated Creatinine Clearance: 48.8 mL/min (by C-G formula based on SCr of 0.78 mg/dL).    Allergies  Allergen Reactions  . Codeine Rash     angioedema  . Neomycin Itching  . Penicillins Itching and Rash       . Tetanus Toxoids Rash  . Levofloxacin Rash    Antimicrobials this admission: 10/22 Vancomycin>>10/24 10/22 Aztreonam>>10/23 10/22 Clindamycin >> 10/23 10/23 Cefepime >> 10/25 10/25 Unasyn >>  Dose adjustments this admission:   Microbiology results: 10/22BCx: ngtd 10/22 UCx: NGF 10/23 MRSA nasal swab: neg 10/22 Strep pneumo: neg  Thank you for allowing pharmacy to be a part of this patient's care.  Lynann Beaverhristine Khyra Viscuso PharmD, BCPS Pager [phone] 02/08/2016 9:54 AM

## 2016-02-08 NOTE — Progress Notes (Signed)
PROGRESS NOTE  Sheila Cisneros  ZOX:[id number] DOB: [birth year] DOA: 02/05/2016 PCP: Sanda Lingerhomas Jones, MD  Brief Narrative:   Sheila Amassrrie L Rains is a 75 y.o. female with osteoarthritis, COPD, bronchiectasis, chronic lower back pain, type 2 diabetes, GERD, gout, hypertension, hyperlipidemia, previous pneumonia episodes in the past who comes to the ED via EMS due progressively worse fatigue, congestion and fever.   Temperature of 101.65F in the emergency department.  WBC was 14.4, potassium level was 2.7 mM/ML,  lactic acid was 2.25 mmol/L.  Chest radiograph showed worsening bibasilar infiltrates. Left wrist x-ray showed moderate soft tissue swelling with no definite acute osseous abnormality.  She was started on broad spectrum antibiotics and IVF.  Her fevers and leukocytosis have improved.  Her cultures are negative.  She remains lethargic today, but should be ready for discharge on oral antibiotics once encephalopathy improves some.     Assessment & Plan:   Principal Problem:   HCAP (healthcare-associated pneumonia) Active Problems:   Essential hypertension   GERD (gastroesophageal reflux disease)   Dementia without behavioral disturbance   Type II diabetes mellitus with manifestations (HCC)   Hyperlipidemia with target LDL less than 100   Hypokalemia   COPD (chronic obstructive pulmonary disease) (HCC)   Insomnia due to anxiety and fear   Type 2 diabetes mellitus (HCC)   Encephalopathy, metabolic  Acute respiratory failure with hypoxia secondary to HCAP (healthcare-associated pneumonia)   COPD (chronic obstructive pulmonary disease) (HCC)   History of bronchiectasis. Continue duonebs Continue cefepime D/c vancomycin as MRSA PCR negative Speech therapy:  Downgraded recommendation today due to lethargy Blood cultures NGTD strep pneumo neg  Aspiration - Pt likely had aspiration event yesterday, placed on IV unasyn to provide anaerobic coverage.  Left wrist cellulitis -  Improving on  antibiotics above -  Doubt septic joint due to preserved ROM of wrist.   Acute metabolic encephalopathy related to pneumonia complicated by underlying dementia -  D/c sedating medications including trazodone and claritin -  Not on benzos or opiates -  Continue treatment for underlying infection    Essential hypertension, blood pressures dropping some Continue verapamil 240 mg daily Holding HCTZ    GERD (gastroesophageal reflux disease) with likely aspiration -  D/c PPI -  Use prn H2 blocker for symptomatic GERD    Type 2 diabetes mellitus (HCC), CBG stable, A1c 6.4  Carbohydrate modified diet. D/c SSI and manage with diet only  CBG (last 3)   Recent Labs  02/07/16 1139 02/07/16 2226 02/08/16 0747  GLUCAP 131* 192* 140*     Hyperlipidemia, given poor functional status and dementia, unlikely to benefit from ongoing statin  -  D/c'd statin    Hypokalemia, hypomagnesemia, and hypophosphatemia, improving with IV supplementation -  Additional IV magnesium today - repeat K, Magnesium, and phos in AM    Insomnia due to anxiety and fear -  D/c trazodone 50 mg by mouth at bedtime due to sedation/lethargy today  Dementia without behavioral disturbance. Continue Aricept 5 mg by mouth at bedtime. Continue Effexor 37.5 mg by mouth daily Palliative care consult at SNF   DVT prophylaxis: Lovenox SQ. Code Status: Full code. Family Communication:  patient alone, no family at bedside.  Attempted to call brother Tomasa BlaseJohn [name], but he did not pick up. Disposition Plan:  SNF 10/26  Consultants:   none  Procedures:  none  Antimicrobials:  Anti-infectives    Start     Dose/Rate Route Frequency Ordered Stop   02/08/16 1000  Ampicillin-Sulbactam (UNASYN) 3 g in sodium chloride 0.9 % 100 mL IVPB     3 g 200 mL/hr over 30 Minutes Intravenous Every 6 hours 02/08/16 0940     02/06/16 2200  vancomycin (VANCOCIN) IVPB 750 mg/150 ml premix  Status:  Discontinued     750 mg 150 mL/hr  over 60 Minutes Intravenous Every 24 hours 02/05/16 2002 02/07/16 1545   02/06/16 1230  ceFEPIme (MAXIPIME) 1 g in dextrose 5 % 50 mL IVPB  Status:  Discontinued     1 g 100 mL/hr over 30 Minutes Intravenous 2 times daily 02/06/16 1200 02/08/16 0936   02/06/16 0600  aztreonam (AZACTAM) 1 g in dextrose 5 % 50 mL IVPB  Status:  Discontinued     1 g 100 mL/hr over 30 Minutes Intravenous Every 8 hours 02/05/16 2002 02/06/16 1114   02/06/16 0600  clindamycin (CLEOCIN) IVPB 600 mg  Status:  Discontinued     600 mg 100 mL/hr over 30 Minutes Intravenous Every 8 hours 02/05/16 2002 02/06/16 1114   02/05/16 1930  clindamycin (CLEOCIN) IVPB 600 mg     600 mg 100 mL/hr over 30 Minutes Intravenous  Once 02/05/16 1916 02/05/16 2212   02/05/16 1930  aztreonam (AZACTAM) 2 g in dextrose 5 % 50 mL IVPB     2 g 100 mL/hr over 30 Minutes Intravenous  Once 02/05/16 1916 02/05/16 2128   02/05/16 1930  vancomycin (VANCOCIN) IVPB 1000 mg/200 mL premix     1,000 mg 200 mL/hr over 60 Minutes Intravenous  Once 02/05/16 1916 02/05/16 2312      Subjective:  Pt sitting up in chair today.  Had an episode of aspiration yesterday.  Feels better today.  Still with left arm, elbow pain but improving.    Objective: Vitals:   02/07/16 2047 02/07/16 2055 02/08/16 0657 02/08/16 1347  BP: (!) 158/101 (!) 149/92 (!) 162/90 (!) 169/82  Pulse: (!) 101  80 95  Resp: 18  18 19   Temp: 98.7 F (37.1 C)  98.3 F (36.8 C) 98.7 F (37.1 C)  TempSrc: Oral  Oral Oral  SpO2: 100%  98% 96%  Weight:      Height:        Intake/Output Summary (Last 24 hours) at 02/08/16 1350 Last data filed at 02/07/16 2302  Gross per 24 hour  Intake              530 ml  Output                0 ml  Net              530 ml   Filed Weights   02/05/16 2007 02/06/16 0645  Weight: 49.9 kg (110 lb) 54.4 kg (120 lb)    Examination:  General exam:  Adult female, thin and slumped to the side of the bed, sleeping but arouses to loud voice.  No  acute distress.  HEENT:  NCAT, MMM Respiratory system:  bilateral BS, scattered rales, wheezing has improved.   Cardiovascular system: Regular rate and rhythm, normal S1/S2. No murmurs, rubs, gallops or clicks.  Warm extremities Gastrointestinal system: Normal active bowel sounds, soft, nondistended, nontender. MSK:  Decreased tone and bulk, no lower extremity edema.  Left wrist swelling and redness improving but tender to palpate.  Trace edema of left wrist.  ROM intact left wrist Neuro:  Grossly moves all extremities but diffusely weak. Psych: awake, alert, sitting up in chair    Data  Reviewed: I have personally reviewed following labs and imaging studies  CBC:  Recent Labs Lab 02/05/16 1818 02/06/16 0228 02/07/16 0534 02/08/16 0523  WBC 14.4* 10.7* 9.6 12.1*  NEUTROABS  --  7.7  --   --   HGB 12.2 10.4* 10.3* 10.8*  HCT 36.8 30.7* 31.1* 32.4*  MCV 87.2 85.5 86.4 85.9  PLT 265 206 243 281   Basic Metabolic Panel:  Recent Labs Lab 02/05/16 1818 02/05/16 2338 02/06/16 0224 02/06/16 1406 02/07/16 0534 02/08/16 0523  NA 145  --  143  --  141 139  K 2.7*  --  2.9* 3.4* 3.6 3.2*  CL 108  --  113*  --  112* 107  CO2 27  --  22  --  24 25  GLUCOSE 265*  --  246*  --  123* 148*  BUN 20  --  17  --  10 13  CREATININE 1.15*  --  1.03*  --  0.75 0.78  CALCIUM 9.1  --  7.9*  --  8.3* 8.8*  MG  --  1.4*  --   --  1.7 1.7  PHOS  --  2.3*  --   --   --  3.1   GFR: Estimated Creatinine Clearance: 48.8 mL/min (by C-G formula based on SCr of 0.78 mg/dL). Liver Function Tests:  Recent Labs Lab 02/05/16 1818 02/06/16 0224 02/08/16 0523  AST 25 22  --   ALT 16 12*  --   ALKPHOS 119 90  --   BILITOT 0.5 0.5  --   PROT 7.7 6.1*  --   ALBUMIN 3.1* 2.4* 2.5*   No results for input(s): LIPASE, AMYLASE in the last 168 hours. No results for input(s): AMMONIA in the last 168 hours. Coagulation Profile: No results for input(s): INR, PROTIME in the last 168 hours. Cardiac  Enzymes: No results for input(s): CKTOTAL, CKMB, CKMBINDEX, TROPONINI in the last 168 hours. BNP (last 3 results) No results for input(s): PROBNP in the last 8760 hours. HbA1C:  Recent Labs  02/06/16 0811  HGBA1C 6.4*   CBG:  Recent Labs Lab 02/06/16 2231 02/07/16 0802 02/07/16 1139 02/07/16 2226 02/08/16 0747  GLUCAP 113* 118* 131* 192* 140*   Lipid Profile: No results for input(s): CHOL, HDL, LDLCALC, TRIG, CHOLHDL, LDLDIRECT in the last 72 hours. Thyroid Function Tests: No results for input(s): TSH, T4TOTAL, FREET4, T3FREE, THYROIDAB in the last 72 hours. Anemia Panel: No results for input(s): VITAMINB12, FOLATE, FERRITIN, TIBC, IRON, RETICCTPCT in the last 72 hours. Urine analysis:    Component Value Date/Time   COLORURINE YELLOW 02/05/2016 2043   APPEARANCEUR CLEAR 02/05/2016 2043   LABSPEC 1.029 02/05/2016 2043   PHURINE 6.5 02/05/2016 2043   GLUCOSEU >1000 (A) 02/05/2016 2043   GLUCOSEU NEGATIVE 03/23/2015 1432   HGBUR NEGATIVE 02/05/2016 2043   BILIRUBINUR NEGATIVE 02/05/2016 2043   KETONESUR NEGATIVE 02/05/2016 2043   PROTEINUR 100 (A) 02/05/2016 2043   UROBILINOGEN 0.2 03/23/2015 1432   NITRITE NEGATIVE 02/05/2016 2043   LEUKOCYTESUR NEGATIVE 02/05/2016 2043   Sepsis Labs: @LABRCNTIP (procalcitonin:4,lacticidven:4)  ) Recent Results (from the past 240 hour(s))  Blood Culture (routine x 2)     Status: None (Preliminary result)   Collection Time: 02/05/16  6:43 PM  Result Value Ref Range Status   Specimen Description BLOOD RIGHT ANTECUBITAL  Final   Special Requests BOTTLES DRAWN AEROBIC AND ANAEROBIC EA  Final   Culture   Final    NO GROWTH 1 DAY Performed  at [facility]    Report Status PENDING  Incomplete  Blood Culture (routine x 2)     Status: None (Preliminary result)   Collection Time: 02/05/16  6:51 PM  Result Value Ref Range Status   Specimen Description BLOOD BLOOD RIGHT FOREARM  Final   Special Requests IN PEDIATRIC BOTTLE   Final   Culture   Final    NO GROWTH 1 DAY Performed at [facility]    Report Status PENDING  Incomplete  Urine culture     Status: None   Collection Time: 02/05/16  8:43 PM  Result Value Ref Range Status   Specimen Description URINE, RANDOM  Final   Special Requests NONE  Final   Culture NO GROWTH Performed at [facility]   Final   Report Status 02/07/2016 FINAL  Final  MRSA PCR Screening     Status: None   Collection Time: 02/06/16 11:15 AM  Result Value Ref Range Status   MRSA by PCR NEGATIVE NEGATIVE Final    Comment:        The GeneXpert MRSA Assay (FDA approved for NASAL specimens only), is one component of a comprehensive MRSA colonization surveillance program. It is not intended to diagnose MRSA infection nor to guide or monitor treatment for MRSA infections.       Radiology Studies: No results found.   Scheduled Meds: . ampicillin-sulbactam (UNASYN) IV  3 g Intravenous Q6H  . donepezil  5 mg Oral QHS  . enoxaparin (LOVENOX) injection  40 mg Subcutaneous QHS  . feeding supplement (ENSURE ENLIVE)  237 mL Oral BID BM  . ipratropium-albuterol  3 mL Nebulization BID  . multivitamin with minerals  1 tablet Oral Daily  . potassium chloride  40 mEq Oral BID  . sodium chloride flush  3 mL Intravenous Q12H  . verapamil  240 mg Oral QHS   Continuous Infusions:     LOS: 3 days   Time spent: 30 min  Standley Dakins, MD [facility] Pager [phone]  If 7PM-7AM, please contact night-coverage [email or web address] Password [email or web address] 02/08/2016, 1:50 PM

## 2016-02-09 ENCOUNTER — Inpatient Hospital Stay (HOSPITAL_COMMUNITY): Payer: Medicare Other

## 2016-02-09 ENCOUNTER — Encounter (HOSPITAL_COMMUNITY): Payer: Self-pay | Admitting: Family Medicine

## 2016-02-09 DIAGNOSIS — E11649 Type 2 diabetes mellitus with hypoglycemia without coma: Secondary | ICD-10-CM | POA: Diagnosis not present

## 2016-02-09 DIAGNOSIS — E119 Type 2 diabetes mellitus without complications: Secondary | ICD-10-CM | POA: Diagnosis not present

## 2016-02-09 DIAGNOSIS — J810 Acute pulmonary edema: Secondary | ICD-10-CM | POA: Diagnosis not present

## 2016-02-09 DIAGNOSIS — E874 Mixed disorder of acid-base balance: Secondary | ICD-10-CM | POA: Diagnosis not present

## 2016-02-09 DIAGNOSIS — M109 Gout, unspecified: Secondary | ICD-10-CM | POA: Diagnosis not present

## 2016-02-09 DIAGNOSIS — A419 Sepsis, unspecified organism: Secondary | ICD-10-CM | POA: Diagnosis not present

## 2016-02-09 DIAGNOSIS — M47812 Spondylosis without myelopathy or radiculopathy, cervical region: Secondary | ICD-10-CM | POA: Diagnosis not present

## 2016-02-09 DIAGNOSIS — J80 Acute respiratory distress syndrome: Secondary | ICD-10-CM | POA: Diagnosis not present

## 2016-02-09 DIAGNOSIS — K219 Gastro-esophageal reflux disease without esophagitis: Secondary | ICD-10-CM | POA: Diagnosis not present

## 2016-02-09 DIAGNOSIS — M545 Low back pain: Secondary | ICD-10-CM | POA: Diagnosis not present

## 2016-02-09 DIAGNOSIS — E785 Hyperlipidemia, unspecified: Secondary | ICD-10-CM | POA: Diagnosis not present

## 2016-02-09 DIAGNOSIS — J449 Chronic obstructive pulmonary disease, unspecified: Secondary | ICD-10-CM | POA: Diagnosis not present

## 2016-02-09 DIAGNOSIS — R918 Other nonspecific abnormal finding of lung field: Secondary | ICD-10-CM | POA: Diagnosis not present

## 2016-02-09 DIAGNOSIS — Z79899 Other long term (current) drug therapy: Secondary | ICD-10-CM | POA: Diagnosis not present

## 2016-02-09 DIAGNOSIS — E876 Hypokalemia: Secondary | ICD-10-CM | POA: Diagnosis not present

## 2016-02-09 DIAGNOSIS — E870 Hyperosmolality and hypernatremia: Secondary | ICD-10-CM | POA: Diagnosis not present

## 2016-02-09 DIAGNOSIS — T17908S Unspecified foreign body in respiratory tract, part unspecified causing other injury, sequela: Secondary | ICD-10-CM | POA: Diagnosis not present

## 2016-02-09 DIAGNOSIS — G9341 Metabolic encephalopathy: Secondary | ICD-10-CM | POA: Diagnosis not present

## 2016-02-09 DIAGNOSIS — I10 Essential (primary) hypertension: Secondary | ICD-10-CM | POA: Diagnosis not present

## 2016-02-09 DIAGNOSIS — J470 Bronchiectasis with acute lower respiratory infection: Secondary | ICD-10-CM | POA: Diagnosis not present

## 2016-02-09 DIAGNOSIS — J42 Unspecified chronic bronchitis: Secondary | ICD-10-CM | POA: Diagnosis not present

## 2016-02-09 DIAGNOSIS — Z515 Encounter for palliative care: Secondary | ICD-10-CM | POA: Diagnosis not present

## 2016-02-09 DIAGNOSIS — I5032 Chronic diastolic (congestive) heart failure: Secondary | ICD-10-CM | POA: Diagnosis not present

## 2016-02-09 DIAGNOSIS — J690 Pneumonitis due to inhalation of food and vomit: Secondary | ICD-10-CM | POA: Diagnosis not present

## 2016-02-09 DIAGNOSIS — R531 Weakness: Secondary | ICD-10-CM | POA: Diagnosis not present

## 2016-02-09 DIAGNOSIS — J189 Pneumonia, unspecified organism: Secondary | ICD-10-CM | POA: Diagnosis not present

## 2016-02-09 DIAGNOSIS — J15212 Pneumonia due to Methicillin resistant Staphylococcus aureus: Secondary | ICD-10-CM | POA: Diagnosis not present

## 2016-02-09 DIAGNOSIS — G309 Alzheimer's disease, unspecified: Secondary | ICD-10-CM | POA: Diagnosis not present

## 2016-02-09 DIAGNOSIS — J9621 Acute and chronic respiratory failure with hypoxia: Secondary | ICD-10-CM | POA: Diagnosis not present

## 2016-02-09 DIAGNOSIS — R0600 Dyspnea, unspecified: Secondary | ICD-10-CM | POA: Diagnosis not present

## 2016-02-09 DIAGNOSIS — J431 Panlobular emphysema: Secondary | ICD-10-CM | POA: Diagnosis not present

## 2016-02-09 DIAGNOSIS — F0390 Unspecified dementia without behavioral disturbance: Secondary | ICD-10-CM | POA: Diagnosis present

## 2016-02-09 DIAGNOSIS — R402421 Glasgow coma scale score 9-12, in the field [EMT or ambulance]: Secondary | ICD-10-CM | POA: Diagnosis not present

## 2016-02-09 DIAGNOSIS — R0989 Other specified symptoms and signs involving the circulatory and respiratory systems: Secondary | ICD-10-CM | POA: Diagnosis not present

## 2016-02-09 DIAGNOSIS — Z7189 Other specified counseling: Secondary | ICD-10-CM | POA: Diagnosis not present

## 2016-02-09 DIAGNOSIS — Z4682 Encounter for fitting and adjustment of non-vascular catheter: Secondary | ICD-10-CM | POA: Diagnosis not present

## 2016-02-09 DIAGNOSIS — T17908A Unspecified foreign body in respiratory tract, part unspecified causing other injury, initial encounter: Secondary | ICD-10-CM | POA: Diagnosis not present

## 2016-02-09 DIAGNOSIS — Z681 Body mass index (BMI) 19 or less, adult: Secondary | ICD-10-CM | POA: Diagnosis not present

## 2016-02-09 DIAGNOSIS — M6281 Muscle weakness (generalized): Secondary | ICD-10-CM | POA: Diagnosis not present

## 2016-02-09 DIAGNOSIS — K449 Diaphragmatic hernia without obstruction or gangrene: Secondary | ICD-10-CM | POA: Diagnosis not present

## 2016-02-09 DIAGNOSIS — R64 Cachexia: Secondary | ICD-10-CM | POA: Diagnosis not present

## 2016-02-09 DIAGNOSIS — R1312 Dysphagia, oropharyngeal phase: Secondary | ICD-10-CM | POA: Diagnosis not present

## 2016-02-09 DIAGNOSIS — M5030 Other cervical disc degeneration, unspecified cervical region: Secondary | ICD-10-CM | POA: Diagnosis not present

## 2016-02-09 DIAGNOSIS — N179 Acute kidney failure, unspecified: Secondary | ICD-10-CM | POA: Diagnosis not present

## 2016-02-09 DIAGNOSIS — G9349 Other encephalopathy: Secondary | ICD-10-CM | POA: Diagnosis not present

## 2016-02-09 DIAGNOSIS — I110 Hypertensive heart disease with heart failure: Secondary | ICD-10-CM | POA: Diagnosis not present

## 2016-02-09 DIAGNOSIS — L03114 Cellulitis of left upper limb: Secondary | ICD-10-CM | POA: Diagnosis not present

## 2016-02-09 DIAGNOSIS — E118 Type 2 diabetes mellitus with unspecified complications: Secondary | ICD-10-CM | POA: Diagnosis not present

## 2016-02-09 DIAGNOSIS — B9562 Methicillin resistant Staphylococcus aureus infection as the cause of diseases classified elsewhere: Secondary | ICD-10-CM | POA: Diagnosis not present

## 2016-02-09 DIAGNOSIS — G8929 Other chronic pain: Secondary | ICD-10-CM | POA: Diagnosis not present

## 2016-02-09 DIAGNOSIS — J9601 Acute respiratory failure with hypoxia: Secondary | ICD-10-CM | POA: Diagnosis not present

## 2016-02-09 DIAGNOSIS — M436 Torticollis: Secondary | ICD-10-CM | POA: Diagnosis not present

## 2016-02-09 LAB — CBC
HCT: 32 % — ABNORMAL LOW (ref 36.0–46.0)
HEMOGLOBIN: 10.5 g/dL — AB (ref 12.0–15.0)
MCH: 28.4 pg (ref 26.0–34.0)
MCHC: 32.8 g/dL (ref 30.0–36.0)
MCV: 86.5 fL (ref 78.0–100.0)
PLATELETS: 311 10*3/uL (ref 150–400)
RBC: 3.7 MIL/uL — AB (ref 3.87–5.11)
RDW: 13.9 % (ref 11.5–15.5)
WBC: 13.4 10*3/uL — ABNORMAL HIGH (ref 4.0–10.5)

## 2016-02-09 LAB — GLUCOSE, CAPILLARY
Glucose-Capillary: 143 mg/dL — ABNORMAL HIGH (ref 65–99)
Glucose-Capillary: 175 mg/dL — ABNORMAL HIGH (ref 65–99)

## 2016-02-09 LAB — RENAL FUNCTION PANEL
Albumin: 2.3 g/dL — ABNORMAL LOW (ref 3.5–5.0)
Anion gap: 7 (ref 5–15)
BUN: 13 mg/dL (ref 6–20)
CALCIUM: 8.7 mg/dL — AB (ref 8.9–10.3)
CHLORIDE: 105 mmol/L (ref 101–111)
CO2: 27 mmol/L (ref 22–32)
CREATININE: 0.75 mg/dL (ref 0.44–1.00)
GFR calc non Af Amer: >60 mL/min (ref >60)
Glucose, Bld: 151 mg/dL — ABNORMAL HIGH (ref 65–99)
Phosphorus: 2.6 mg/dL (ref 2.5–4.6)
Potassium: 3.7 mmol/L (ref 3.5–5.1)
SODIUM: 139 mmol/L (ref 135–145)

## 2016-02-09 LAB — MAGNESIUM: MAGNESIUM: 1.6 mg/dL — AB (ref 1.7–2.4)

## 2016-02-09 MED ORDER — ALBUTEROL SULFATE (2.5 MG/3ML) 0.083% IN NEBU: 2.5000 mg | INHALATION_SOLUTION | RESPIRATORY_TRACT | 0 refills | Status: DC | PRN

## 2016-02-09 MED ORDER — AMOXICILLIN-POT CLAVULANATE 250-62.5 MG/5ML PO SUSR: 500.0000 mg | Freq: Two times a day (BID) | ORAL | 0 refills | Status: AC

## 2016-02-09 MED ORDER — HYDRALAZINE HCL 20 MG/ML IJ SOLN
10.0000 mg | Freq: Once | INTRAMUSCULAR | Status: AC
  Administered 2016-02-09: 10 mg via INTRAVENOUS
  Filled 2016-02-09: qty 1

## 2016-02-09 NOTE — Progress Notes (Signed)
[facility].  All questions answered.  VSS.  Discharge summary placed in packet.  PT discharge via [facility].

## 2016-02-09 NOTE — Plan of Care (Signed)
Problem: Health Behavior/Discharge Planning: Goal: Ability to manage health-related needs will improve Outcome: Adequate for Discharge Will d/c to SNF.

## 2016-02-09 NOTE — Clinical Social Work Placement (Signed)
Patient is set to discharge to [facility] today. Patient & sister-in-law, Sheila Cisneros made aware. Discharge packet given to RN, Florentina AddisonKatie. [facility] called for transport.     Lincoln MaxinKelly Zoiey Christy, LCSW [facility] Clinical Social Worker cell #: [phone]    CLINICAL SOCIAL WORK PLACEMENT  NOTE  Date:  02/09/2016  Patient Details  Name: Sheila Cisneros MRN: [id number] Date of Birth: February 05, 1941  Clinical Social Work is seeking post-discharge placement for this patient at the Skilled  Nursing Facility level of care (*CSW will initial, date and re-position this form in  chart as items are completed):  Yes   Patient/family provided with [facility] Clinical Social Work Department's list of facilities offering this level of care within the geographic area requested by the patient (or if unable, by the patient's family).  Yes   Patient/family informed of their freedom to choose among providers that offer the needed level of care, that participate in Medicare, Medicaid or managed care program needed by the patient, have an available bed and are willing to accept the patient.  Yes   Patient/family informed of [facility]'s ownership interest in [facility] and [facility], as well as of the fact that they are under no obligation to receive care at these facilities.  PASRR submitted to EDS on       PASRR number received on       Existing PASRR number confirmed on 02/07/16     FL2 transmitted to all facilities in geographic area requested by pt/family on 02/07/16     FL2 transmitted to all facilities within larger geographic area on       Patient informed that his/her managed care company has contracts with or will negotiate with certain facilities, including the following:        Yes   Patient/family informed of bed offers received.  Patient [facility] bed [facility]     Physician recommends and patient chooses bed at      Patient to be transferred to  [facility] on 02/09/16.  Patient to be transferred to facility by [facility]     Patient family notified on 02/09/16 of transfer.  Name of family member notified:  patient's sister-in-law, Sheila Cisneros via phone     PHYSICIAN       Additional Comment:    _______________________________________________ Sheila Cisneros, Sheila Lesmeister F, LCSW 02/09/2016, 12:50 PM

## 2016-02-09 NOTE — Consult Note (Signed)
   [facility]   02/09/2016  Tiajuana Amassrrie L Vorhees [birth year] [id number]   [facility]. Chart reviewed. Disposition plans remain SNF. [facility] at [facility].  Raiford NobleAtika Landis Dowdy, MSN-Ed, RN,BSN [facility] Care Management Hospital Liaison [phone]

## 2016-02-09 NOTE — Care Management Note (Signed)
Case Management Note  Patient Details  Name: Sheila Cisneros MRN: [id number] Date of Birth: 10-11-1940  Subjective/Objective:  D/c SNF-CSW aware & following.                  Action/Plan:d/c SNF.   Expected Discharge Date:                  Expected Discharge Plan:  Skilled Nursing Facility  In-House Referral:  Clinical Social Work  Discharge planning Services  CM Consult  Post Acute Care Choice:    Choice offered to:     DME Arranged:    DME Agency:     HH Arranged:    HH Agency:     Status of Service:  Completed, signed off  If discussed at [employer] Meetings, dates discussed:    Additional Comments:  Sheila Cisneros, Sheila Spagnolo, RN 02/09/2016, 10:30 AM

## 2016-02-09 NOTE — Discharge Summary (Signed)
Physician Discharge Summary  Sheila Cisneros UEA:[id number] DOB: [birth year] DOA: 02/05/2016  PCP: Sanda Lingerhomas Jones, MD  Admit date: 02/05/2016 Discharge date: 02/09/2016  Admitted From: Home Disposition:  SNF  Please Consult Dietitian and Speech therapy to follow patient at SNF Please consult palliative care at SNF to further discuss goals of care  Discharge Condition: STABLE CODE STATUS: FULL Diet recommendation: Diet recommendations: Dysphagia 1 (puree);Thin liquid Liquids provided via: Cup;Straw Medication Administration: Whole meds with puree Supervision: Full supervision/cueing for compensatory strategies;Staff to assist with self feeding Compensations: Minimize environmental distractions;Slow rate;Small sips/bites Postural Changes and/or Swallow Maneuvers: Seated upright 90 degrees  Brief/Interim Summary: HPI: Sheila Amassrrie L Walsworth is a 75 y.o. female with medical history significant of osteoarthritis, COPD, bronchiectasis, Chronic lower back pain, type 2 diabetes, GERD, gout, hypertension, hyperlipidemia, previous pneumonia episodes in the past who comes to the ED via EMS due progressively worse fatigue, congestion and fever.  Per patient's nephew Lorin PicketScott, she has been feeling fatigued for about 3 weeks. However, in the past 3 days, she has been a lot more fatigued, increased congestion, positive productive cough, no endorsement of fever, chills or night sweats at home, but the patient had a temperature of 101.27F in the emergency department. No travel history or sick contacts.   The patient also has tenderness, calor to palpation with surrounding erythema and edema. No known history of trauma to this area.  ED Course: WBC was 14.4, potassium level was 2.7 mM/ML,  lactic acid was 2.25 mmol/L, glucose was 265 mg/dL The patient received at 1750 normal saline bolus. She also received aztreonam, clindamycin and vancomycin per pharmacy.  Imaging: Chest radiograph shows worsening bibasilar  infiltrates. Left wrist x-ray shows moderate soft tissue swelling with no definite acute osseous abnormality. No acute findings on CT of the head and cervical spine, but showing very mild basal ganglia lacunar infarcts, chronic ischemic microvascular white matter disease and chronic sinusitis on head CT, osteoarthritis and DDD of the C-spine.  Acute respiratory failure with hypoxia secondary to HCAP (healthcare-associated pneumonia) COPD (chronic obstructive pulmonary disease) (HCC) History of bronchiectasis. Continue duonebs Continue cefepime D/c vancomycin as MRSA PCR negative Speech therapy:  Dysphagia 1 diet recommended Blood cultures NGTD strep pneumo neg  Aspiration - Pt likely had aspiration event 10/24, was placed on IV unasyn to provide anaerobic coverage and tolerated well, dc on augmentin suspension x 7 days.  SLP saw patient and recommended esophagram (results below) no acute findings.    Left wrist cellulitis -  Improving on antibiotics -  Doubt septic joint due to preserved ROM of wrist.   Acute metabolic encephalopathy related to pneumonia complicated by underlying dementia (resolved now) appears at baseline per family -  D/c sedating medications including trazodone and claritin -  Not on benzos or opiates -  Continue treatment for underlying infection  Essential hypertension, blood pressures dropping some Continue verapamil 240 mg daily Holding HCTZ - Did not restart at discharge as has been having hypokalemia  GERD (gastroesophageal reflux disease) with likely aspiration -  D/c PPI -  Use prn H2 blocker for symptomatic GERD in hospital  Type 2diabetes mellitus (HCC), CBG stable, A1c 6.4  Carbohydrate modified diet. 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 Continue Aricept 5 mg by mouth at bedtime. Continue Effexor 37.5 mg by mouth daily Palliative care consult at SNF   DVT prophylaxis:Lovenox SQ. Code Status:Full code. Family Communication: updated many family members at bedside and they are in agreement with SNF placement Disposition Plan: SNF 10/26  Discharge Diagnoses:  Principal Problem:   HCAP (healthcare-associated pneumonia) Active Problems:   Essential hypertension   GERD (gastroesophageal reflux disease)   Dementia without behavioral disturbance   Type II diabetes mellitus with manifestations (HCC)   Hyperlipidemia with target LDL less than 100   Hypokalemia   COPD (chronic obstructive pulmonary disease) (HCC)   Insomnia due to anxiety and fear   Type 2 diabetes mellitus (HCC)   Encephalopathy, metabolic    Discharge Instructions     Medication List    STOP taking these medications   hydrochlorothiazide 12.5 MG capsule Commonly known as:  MICROZIDE   IRON PO   loratadine 10 MG tablet Commonly known as:  CLARITIN   MELATONIN PO   potassium chloride 20 MEQ packet Commonly known as:  KLOR-CON   traZODone 50 MG tablet Commonly known as:  DESYREL     TAKE these medications   albuterol (2.5 MG/3ML) 0.083% nebulizer solution Commonly known as:  PROVENTIL Take 3 mLs (2.5 mg total) by nebulization every 4 (four) hours as needed for wheezing or shortness of breath.   amoxicillin-clavulanate 250-62.5 MG/5ML suspension Commonly known as:  AUGMENTIN Take 10 mLs (500 mg total) by mouth 2 (two) times daily.   cholecalciferol 1000 units tablet Commonly known as:  VITAMIN D Take  1,000 Units by mouth daily.   donepezil 5 MG tablet Commonly known as:  ARICEPT TAKE 1 TABLET (5 MG TOTAL) BY MOUTH AT BEDTIME.   feeding supplement (ENSURE COMPLETE) Liqd Take 237 mLs by mouth 2 (two) times daily between meals.   glucose blood test strip Use to check blood sugar once daily.   multivitamin capsule Take 1 capsule by mouth daily.   simvastatin 20 MG tablet Commonly known as:  ZOCOR Take 1 tablet (20 mg total) by mouth at bedtime.   Tiotropium Bromide-Olodaterol 2.5-2.5 MCG/ACT Aers Commonly known as:  STIOLTO RESPIMAT Inhale 2 puffs into the lungs daily.   TYLENOL 500 MG tablet Generic drug:  acetaminophen Take 500 mg by mouth every 6 (six) hours as needed. For pain   venlafaxine 37.5 MG tablet Commonly known as:  EFFEXOR Take 37.5 mg by mouth daily as needed (hot flashes).   verapamil 240 MG (CO) 24 hr tablet Commonly known as:  COVERA HS Take 1 tablet (240 mg total) by mouth at bedtime.   vitamin C 500 MG tablet Commonly known as:  ASCORBIC ACID Take 500 mg by mouth daily.      Follow-up Information    Sanda Linger, MD. Schedule an appointment as soon as possible for a visit in 1 week(s).   Specialty:  Internal Medicine Contact information: [address] [place] [address] [phone]          Allergies  Allergen Reactions  . Codeine Rash     angioedema  . Neomycin Itching  . Penicillins Itching and Rash       . Tetanus Toxoids Rash  . Levofloxacin Rash  Procedures/Studies: Dg Chest 2 View  Result Date: 02/05/2016 CLINICAL DATA:  Increased lethargy, chest congestion, and fever. EXAM: CHEST  2 VIEW COMPARISON:  10/17/2015 FINDINGS: Kyphotic angulation on the AP radiograph with the cardiac silhouette partially obscured. A hiatal hernia is again seen. Lung volumes are diminished compared to the prior study with persistent bibasilar opacities, left greater than right and mildly increased from the prior study. No  sizable pleural effusion or pneumothorax is identified. Thoracolumbar scoliosis is again noted. IMPRESSION: 1. Chronic bibasilar opacities which have mildly increased from the prior study and may reflect a combination of atelectasis and chronic aspiration. Superimposed infectious pneumonia is also possible. 2. Hiatal hernia. Electronically Signed   By: Sebastian Ache M.D.   On: 02/05/2016 20:24   Dg Wrist Complete Left  Result Date: 02/05/2016 CLINICAL DATA:  Increased lethargy left wrist swollen and hot to touch EXAM: LEFT WRIST - COMPLETE 3+ VIEW COMPARISON:  None. FINDINGS: Moderate diffuse soft tissue swelling. No radiopaque foreign body. Bones appear osteopenic. No obvious fracture lucency however exam limited by osteopenia. Degenerative changes at the base of the thumb. IMPRESSION: 1. Moderate soft tissue swelling. No definite acute osseous abnormality allowing for osteopenia. 2. If soft tissue or joint infection is suspected, MRI may be obtained for further evaluation. Electronically Signed   By: Jasmine Pang M.D.   On: 02/05/2016 20:25   Ct Head Wo Contrast  Result Date: 02/05/2016 CLINICAL DATA:  Unwitnessed fall today.  Neck pain. EXAM: CT HEAD WITHOUT CONTRAST CT CERVICAL SPINE WITHOUT CONTRAST TECHNIQUE: Multidetector CT imaging of the head and cervical spine was performed following the standard protocol without intravenous contrast. Multiplanar CT image reconstructions of the cervical spine were also generated. COMPARISON:  06/21/2013 and 12/10/2012 FINDINGS: CT HEAD FINDINGS Brain: 6 mm hypodense lesion in the right anterior thalamus compatible with remote lacunar infarct. Suspected small remote lacunar infarcts in the lentiform nuclei and involving the caudate heads. Periventricular white matter and corona radiata hypodensities favor chronic ischemic microvascular white matter disease. Otherwise, The brainstem, cerebellum, cerebral peduncles, thalami, basal ganglia, basilar cisterns, and  ventricular system appear within normal limits. No intracranial hemorrhage, mass lesion, or acute CVA. Vascular: Unremarkable Skull: Unremarkable Sinuses/Orbits: Mild chronic right maxillary sinusitis. Partial left ethmoidectomy with left maxillary antrostomy. Small osteoma along the left ethmoid air cells. Chronic ethmoid and bilateral frontal sinusitis. Other: No supplemental non-categorized findings. CT CERVICAL SPINE FINDINGS Alignment: The neck is significantly rotated during imaging and this could not be corrected. 1.5 mm anterolisthesis at C4-5 and 1.5 mm retrolisthesis at C5-6. There is 2 mm degenerative anterolisthesis at C7-T1. Skull base and vertebrae: Degenerative spurring and loss of articular space at the anterior C1-2 articulation. Small amount of pannus posterior to the odontoid. Loss of disc height and endplate sclerosis at C5-6 and C6-7. No fracture is identified. There is some mild prominence the . prevertebral soft tissues. Chronic sphenoid sinusitis. Soft tissues and spinal canal: Unremarkable Disc levels: Uncinate spurring on the left at C5-6 and C6-7, contributing to mild osseous foraminal narrowing. Upper chest: Unremarkable Other: No supplemental non-categorized findings. IMPRESSION: 1. Right the line make an basal ganglia remote lacunar infarcts without acute intracranial findings. 2. Periventricular white matter and corona radiata hypodensities favor chronic ischemic microvascular white matter disease. 3. Chronic maxillary and sphenoid sinusitis along with chronic ethmoid sinusitis. Prior left ethmoid surgery. Chronic frontal sinusitis. 4. Cervical spondylosis and degenerative disc disease without fracture or subluxation identified. Suboptimal positioning during scanning of the cervical spine due to  twisting of the neck which the patient was not able to correct. 5. Mild osseous foraminal stenosis on the left at C5-6 and C6-7 due to uncinate spurring. Electronically Signed   By: Gaylyn Rong M.D.   On: 02/05/2016 20:02   Ct Cervical Spine Wo Contrast  Result Date: 02/05/2016 CLINICAL DATA:  Unwitnessed fall today.  Neck pain. EXAM: CT HEAD WITHOUT CONTRAST CT CERVICAL SPINE WITHOUT CONTRAST TECHNIQUE: Multidetector CT imaging of the head and cervical spine was performed following the standard protocol without intravenous contrast. Multiplanar CT image reconstructions of the cervical spine were also generated. COMPARISON:  06/21/2013 and 12/10/2012 FINDINGS: CT HEAD FINDINGS Brain: 6 mm hypodense lesion in the right anterior thalamus compatible with remote lacunar infarct. Suspected small remote lacunar infarcts in the lentiform nuclei and involving the caudate heads. Periventricular white matter and corona radiata hypodensities favor chronic ischemic microvascular white matter disease. Otherwise, The brainstem, cerebellum, cerebral peduncles, thalami, basal ganglia, basilar cisterns, and ventricular system appear within normal limits. No intracranial hemorrhage, mass lesion, or acute CVA. Vascular: Unremarkable Skull: Unremarkable Sinuses/Orbits: Mild chronic right maxillary sinusitis. Partial left ethmoidectomy with left maxillary antrostomy. Small osteoma along the left ethmoid air cells. Chronic ethmoid and bilateral frontal sinusitis. Other: No supplemental non-categorized findings. CT CERVICAL SPINE FINDINGS Alignment: The neck is significantly rotated during imaging and this could not be corrected. 1.5 mm anterolisthesis at C4-5 and 1.5 mm retrolisthesis at C5-6. There is 2 mm degenerative anterolisthesis at C7-T1. Skull base and vertebrae: Degenerative spurring and loss of articular space at the anterior C1-2 articulation. Small amount of pannus posterior to the odontoid. Loss of disc height and endplate sclerosis at C5-6 and C6-7. No fracture is identified. There is some mild prominence the . prevertebral soft tissues. Chronic sphenoid sinusitis. Soft tissues and spinal canal:  Unremarkable Disc levels: Uncinate spurring on the left at C5-6 and C6-7, contributing to mild osseous foraminal narrowing. Upper chest: Unremarkable Other: No supplemental non-categorized findings. IMPRESSION: 1. Right the line make an basal ganglia remote lacunar infarcts without acute intracranial findings. 2. Periventricular white matter and corona radiata hypodensities favor chronic ischemic microvascular white matter disease. 3. Chronic maxillary and sphenoid sinusitis along with chronic ethmoid sinusitis. Prior left ethmoid surgery. Chronic frontal sinusitis. 4. Cervical spondylosis and degenerative disc disease without fracture or subluxation identified. Suboptimal positioning during scanning of the cervical spine due to twisting of the neck which the patient was not able to correct. 5. Mild osseous foraminal stenosis on the left at C5-6 and C6-7 due to uncinate spurring. Electronically Signed   By: Gaylyn Rong M.D.   On: 02/05/2016 20:02   Dg Esophagus  Result Date: 02/09/2016 CLINICAL DATA:  Dysphagia EXAM: ESOPHOGRAM/BARIUM SWALLOW TECHNIQUE: Single contrast examination was performed using  thin barium. FLUOROSCOPY TIME:  Fluoroscopy Time:  2 minutes 30 second Radiation Exposure Index (if provided by the fluoroscopic device): 9.7 mGy Number of Acquired Spot Images: 0 COMPARISON:  None. FINDINGS: Limited exam. The patient had difficulty following instructions to swallow barium. Small sips. Decreased esophageal motility. No aspiration identified. No esophageal stricture or obstruction. No mass lesion. Large hiatal hernia. No reflux identified however the patient was semi-upright for the exam. No mucosal edema in the esophagus. Barium tablet not administered IMPRESSION: Decreased esophageal motility. Moderately large hiatal hernia. No esophageal stricture. Electronically Signed   By: Marlan Palau M.D.   On: 02/09/2016 09:13    Subjective: Pt without complaints today.   Discharge  Exam: Vitals:   02/09/16  0644 02/09/16 0825  BP: (!) 194/102 (!) 148/67  Pulse: 86   Resp: 18   Temp: 98.8 F (37.1 C)    Vitals:   02/08/16 2025 02/09/16 0644 02/09/16 0756 02/09/16 0825  BP: (!) 172/94 (!) 194/102  (!) 148/67  Pulse: (!) 111 86    Resp: 18 18    Temp: 99.2 F (37.3 C) 98.8 F (37.1 C)    TempSrc: Oral Oral    SpO2: 100% 99% 98%   Weight:      Height:        General exam:  Adult female, thin and slumped to the side of the bed, sleeping but arouses to loud voice.  No acute distress.  HEENT:  NCAT, MMM Respiratory system:  bilateral BS, scattered rales, wheezing has improved.   Cardiovascular system: Regular rate and rhythm, normal S1/S2. No murmurs, rubs, gallops or clicks.  Warm extremities Gastrointestinal system: Normal active bowel sounds, soft, nondistended, nontender. MSK:  Decreased tone and bulk, no lower extremity edema.  Left wrist swelling and redness improving but tender to palpate.  Trace edema of left wrist.  ROM intact left wrist Neuro:  Grossly moves all extremities but diffusely weak. Psych: awake, alert, sitting up in chair   The results of significant diagnostics from this hospitalization (including imaging, microbiology, ancillary and laboratory) are listed below for reference.     Microbiology: Recent Results (from the past 240 hour(s))  Blood Culture (routine x 2)     Status: None (Preliminary result)   Collection Time: 02/05/16  6:43 PM  Result Value Ref Range Status   Specimen Description BLOOD RIGHT ANTECUBITAL  Final   Special Requests BOTTLES DRAWN AEROBIC AND ANAEROBIC EA  Final   Culture   Final    NO GROWTH 2 DAYS Performed at [facility]    Report Status PENDING  Incomplete  Blood Culture (routine x 2)     Status: None (Preliminary result)   Collection Time: 02/05/16  6:51 PM  Result Value Ref Range Status   Specimen Description BLOOD BLOOD RIGHT FOREARM  Final   Special Requests IN PEDIATRIC BOTTLE   Final   Culture   Final    NO GROWTH 2 DAYS Performed at [facility]    Report Status PENDING  Incomplete  Urine culture     Status: None   Collection Time: 02/05/16  8:43 PM  Result Value Ref Range Status   Specimen Description URINE, RANDOM  Final   Special Requests NONE  Final   Culture NO GROWTH Performed at [facility]   Final   Report Status 02/07/2016 FINAL  Final  MRSA PCR Screening     Status: None   Collection Time: 02/06/16 11:15 AM  Result Value Ref Range Status   MRSA by PCR NEGATIVE NEGATIVE Final    Comment:        The GeneXpert MRSA Assay (FDA approved for NASAL specimens only), is one component of a comprehensive MRSA colonization surveillance program. It is not intended to diagnose MRSA infection nor to guide or monitor treatment for MRSA infections.      Labs: BNP (last 3 results) No results for input(s): BNP in the last 8760 hours. Basic Metabolic Panel:  Recent Labs Lab 02/05/16 1818 02/05/16 2338 02/06/16 0224 02/06/16 1406 02/07/16 0534 02/08/16 0523 02/09/16 0527  NA 145  --  143  --  141 139 139  K 2.7*  --  2.9* 3.4* 3.6 3.2* 3.7  CL 108  --  113*  --  112* 107 105  CO2 27  --  22  --  24 25 27   GLUCOSE 265*  --  246*  --  123* 148* 151*  BUN 20  --  17  --  10 13 13   CREATININE 1.15*  --  1.03*  --  0.75 0.78 0.75  CALCIUM 9.1  --  7.9*  --  8.3* 8.8* 8.7*  MG  --  1.4*  --   --  1.7 1.7 1.6*  PHOS  --  2.3*  --   --   --  3.1 2.6   Liver Function Tests:  Recent Labs Lab 02/05/16 1818 02/06/16 0224 02/08/16 0523 02/09/16 0527  AST 25 22  --   --   ALT 16 12*  --   --   ALKPHOS 119 90  --   --   BILITOT 0.5 0.5  --   --   PROT 7.7 6.1*  --   --   ALBUMIN 3.1* 2.4* 2.5* 2.3*   No results for input(s): LIPASE, AMYLASE in the last 168 hours. No results for input(s): AMMONIA in the last 168 hours. CBC:  Recent Labs Lab 02/05/16 1818 02/06/16 0228 02/07/16 0534 02/08/16 0523 02/09/16 0527  WBC  14.4* 10.7* 9.6 12.1* 13.4*  NEUTROABS  --  7.7  --   --   --   HGB 12.2 10.4* 10.3* 10.8* 10.5*  HCT 36.8 30.7* 31.1* 32.4* 32.0*  MCV 87.2 85.5 86.4 85.9 86.5  PLT 265 206 243 281 311   Cardiac Enzymes: No results for input(s): CKTOTAL, CKMB, CKMBINDEX, TROPONINI in the last 168 hours. BNP: Invalid input(s): POCBNP CBG:  Recent Labs Lab 02/07/16 1139 02/07/16 2226 02/08/16 0747 02/08/16 2006 02/09/16 0754  GLUCAP 131* 192* 140* 150* 143*   D-Dimer No results for input(s): DDIMER in the last 72 hours. Hgb A1c No results for input(s): HGBA1C in the last 72 hours. Lipid Profile No results for input(s): CHOL, HDL, LDLCALC, TRIG, CHOLHDL, LDLDIRECT in the last 72 hours. Thyroid function studies No results for input(s): TSH, T4TOTAL, T3FREE, THYROIDAB in the last 72 hours.  Invalid input(s): FREET3 Anemia work up No results for input(s): VITAMINB12, FOLATE, FERRITIN, TIBC, IRON, RETICCTPCT in the last 72 hours. Urinalysis    Component Value Date/Time   COLORURINE YELLOW 02/05/2016 2043   APPEARANCEUR CLEAR 02/05/2016 2043   LABSPEC 1.029 02/05/2016 2043   PHURINE 6.5 02/05/2016 2043   GLUCOSEU >1000 (A) 02/05/2016 2043   GLUCOSEU NEGATIVE 03/23/2015 1432   HGBUR NEGATIVE 02/05/2016 2043   BILIRUBINUR NEGATIVE 02/05/2016 2043   KETONESUR NEGATIVE 02/05/2016 2043   PROTEINUR 100 (A) 02/05/2016 2043   UROBILINOGEN 0.2 03/23/2015 1432   NITRITE NEGATIVE 02/05/2016 2043   LEUKOCYTESUR NEGATIVE 02/05/2016 2043   Sepsis Labs Invalid input(s): PROCALCITONIN,  WBC,  LACTICIDVEN Microbiology Recent Results (from the past 240 hour(s))  Blood Culture (routine x 2)     Status: None (Preliminary result)   Collection Time: 02/05/16  6:43 PM  Result Value Ref Range Status   Specimen Description BLOOD RIGHT ANTECUBITAL  Final   Special Requests BOTTLES DRAWN AEROBIC AND ANAEROBIC EA  Final   Culture   Final    NO GROWTH 2 DAYS Performed at [facility]    Report  Status PENDING  Incomplete  Blood Culture (routine x 2)     Status: None (Preliminary result)   Collection Time: 02/05/16  6:51 PM  Result Value Ref Range Status   Specimen Description BLOOD BLOOD RIGHT FOREARM  Final   Special Requests IN PEDIATRIC BOTTLE  Final   Culture   Final    NO GROWTH 2 DAYS Performed at [facility]    Report Status PENDING  Incomplete  Urine culture     Status: None   Collection Time: 02/05/16  8:43 PM  Result Value Ref Range Status   Specimen Description URINE, RANDOM  Final   Special Requests NONE  Final   Culture NO GROWTH Performed at [facility]   Final   Report Status 02/07/2016 FINAL  Final  MRSA PCR Screening     Status: None   Collection Time: 02/06/16 11:15 AM  Result Value Ref Range Status   MRSA by PCR NEGATIVE NEGATIVE Final    Comment:        The GeneXpert MRSA Assay (FDA approved for NASAL specimens only), is one component of a comprehensive MRSA colonization surveillance program. It is not intended to diagnose MRSA infection nor to guide or monitor treatment for MRSA infections.    Time coordinating discharge: 33 minutes  SIGNED:   Standley Dakins, MD  [facility] 02/09/2016, 11:53 AM Pager   If 7PM-7AM, please contact night-coverage [email or web address] Password [email or web address]

## 2016-02-09 NOTE — Care Management Important Message (Signed)
Important Message  Patient Details  Name: Sheila Cisneros MRN: [id number] Date of Birth: Aug 03, 1940   Medicare Important Message Given:  Yes    Haskell FlirtJamison, Janelys Glassner 02/09/2016, 10:23 AMImportant Message  Patient Details  Name: Sheila Cisneros MRN: [id number] Date of Birth: Aug 03, 1940   Medicare Important Message Given:  Yes    Haskell FlirtJamison, Dervin Vore 02/09/2016, 10:22 AM

## 2016-02-09 NOTE — Discharge Instructions (Signed)
Aspiration Pneumonia  Aspiration pneumonia is an infection in your lungs. It occurs when food, liquid, or stomach contents (vomit) are inhaled (aspirated) into your lungs. When these things get into your lungs, swelling (inflammation) and infection can occur. This can make it difficult for you to breathe. Aspiration pneumonia is a serious condition and can be life threatening. RISK FACTORS Aspiration pneumonia is more likely to occur when a person's cough (gag) reflex or ability to swallow has been decreased. Some things that can do this include:   Having a brain injury or disease, such as stroke, seizures, Parkinson's disease, dementia, or amyotrophic lateral sclerosis (ALS).   Being given general anesthetic for procedures.   Being in a coma (unconscious).   Having a narrowing of the tube that carries food to the stomach (esophagus).   Drinking too much alcohol. If a person passes out and vomits, vomit can be swallowed into the lungs.   Taking certain medicines, such as tranquilizers or sedatives.  SIGNS AND SYMPTOMS   Coughing after swallowing food or liquids.   Breathing problems, such as wheezing or shortness of breath.   Bluish skin. This can be caused by lack of oxygen.   Coughing up food or mucus. The mucus might contain blood, greenish material, or yellowish-white fluid (pus).   Fever.   Chest pain.   Being more tired than usual (fatigue).   Sweating more than usual.   Bad breath.  DIAGNOSIS  A physical exam will be done. During the exam, the health care provider will listen to your lungs with a stethoscope to check for:   Crackling sounds in the lungs.  Decreased breath sounds.  A rapid heartbeat. Various tests may be ordered. These may include:   Chest X-ray.   CT scan.   Swallowing study. This test looks at how food is swallowed and whether it goes into your breathing tube (trachea) or food pipe (esophagus).   Sputum culture. Saliva and  mucus (sputum) are collected from the lungs or the tubes that carry air to the lungs (bronchi). The sputum is then tested for bacteria.   Bronchoscopy. This test uses a flexible tube (bronchoscope) to see inside the lungs. TREATMENT  Treatment will usually include antibiotic medicines. Other medicines may also be used to reduce fever or pain. You may need to be treated in the hospital. In the hospital, your breathing will be carefully monitored. Depending on how well you are breathing, you may need to be given oxygen, or you may need breathing support from a breathing machine (ventilator). For people who fail a swallowing study, a feeding tube might be placed in the stomach, or they may be asked to avoid certain food textures or liquids when they eat. HOME CARE INSTRUCTIONS   Carefully follow any special eating instructions you were given, such as avoiding certain food textures or thickening liquids. This reduces the risk of developing aspiration pneumonia again.  Only take over-the-counter or prescription medicines as directed by your health care provider. Follow the directions carefully.   If you were prescribed antibiotics, take them as directed. Finish them even if you start to feel better.   Rest as instructed by your health care provider.   Keep all follow-up appointments with your health care provider.  SEEK MEDICAL CARE IF:   You develop worsening shortness of breath, wheezing, or difficulty breathing.   You develop a fever.   You have chest pain.  MAKE SURE YOU:   Understand these instructions.  Will watch   condition.  Will get help right away if you are not doing well or get worse.   This information is not intended to replace advice given to you by your health care provider. Make sure you discuss any questions you have with your health care provider.   Document Released: 01/28/2009 Document Revised: 04/07/2013 Document Reviewed: 09/18/2012 Elsevier  Interactive Patient Education 2016 Else[employer] Inc.  Aspiration Precautions Aspiration is the breathing in (inhalation) of a liquid or object into the lungs. Things that can be inhaled into the lungs include:   Food.  Any type of liquid, such as drinks or saliva.  Stomach contents, such as vomit or stomach acid. When these things go into the lungs, damage can occur and serious complications can result, such as:  Lung infection (pneumonia).  Collection of infected liquid (pus) in the lungs (lung abscess).  Death. CAUSES The cause of aspiration may include:   A lowered level of awareness (consciousness) due to:  Traumatic brain injury or head injury.  Stroke.  Diseases of the nerves, brain, or spinal cord.  Seizures.  A problem with the gag reflex. The gag reflex protects the body from swallowing things too quickly or things that are too large.  Medical conditions that affect swallowing.  Conditions that affect the food pipe (esophagus).  Acid reflux. This is when stomach acid moves into the esophagus.  Any type of surgery where a medicine to sleep (general anesthetic)or relax (sedative) is given.  Alcohol abuse.  Illegal drug abuse.  Taking medicine that causes sleepiness, confusion, or weakness.  Aging.  Dental problems.  Having a feeding tube. SIGNS AND SYMPTOMS Symptoms of aspiration may include:   Coughing after swallowing food or liquids.  Difficulty breathing. This may include:  Breathing quickly.  Breathing very slowly.  Loud breathing.  Rumbling sounds from the lungs while breathing.  Coughing up phlegm (sputum) that:  Is yellow, tan, or green.  Has pieces of food in it.  Is bad smelling.  A change in voice so that it sounds scratchy.  A change in skin color. The skin may look red or blue.   Fever.  Watery eyes.  Pain in the chest or back.  A pained look on the face.   A feeling of fullness in the throat or that something  is stuck in the throat. DIAGNOSIS Aspiration may be diagnosed by:   Chest X-ray.  Bronchoscopy. This is a surgical procedure in which a thin, flexible tube with a camera is inserted into the nose or mouth to the lungs. The health care provider can then view the lungs.  A swallowing evaluation study to find out:  A person's risk of aspiration.  How difficult it is for a person to swallow.  What types of foods are safe for a person to eat. PREVENTION If you are caring for someone who can eat and drink through his or her mouth:   Have the person sit in an upright position when eating food or drinking fluids, such as:  Sitting up in a chair.  If sitting in a chair is not possible, position the person in bed so he or she is upright.  Remind the person to eat slowly and chew well.  Do not distract the person. This is especially important for people with thinking or memory (cognitive) problems.  Check the person's mouth for leftover food after eating.  Keep the person sitting upright for 30-45 minutes after eating.  Do not serve food or drink for  at least 2 hours before bedtime. If you are caring for someone with a feeding tube who cannot eat or drink through his or her mouth:  Keep the person in an upright position as much as possible.  Do not  lay the person flat if he or she is getting continuous feedings. Turn the feeding pump off if you need to lay the person flat for any reason.  Check feeding tube residuals as directed by your health care provider. Ask your health care provider what residual amount is too high. General guidelines to prevent aspiration in someone you are caring for include:  Feed small amounts of food. Do not force feed.  Food should be thickened as directed by the person's speech pathologist.  Use as little water as possible when brushing the person's teeth or cleaning his or her mouth.  Provide oral care before and after meals.  Never put food or  liquids in the mouth of a person who is not fully alert.  Crush pills and put them in soft food such as pudding or ice cream. Some pills should not be crushed. Check with your health care provider before crushing any medicine. SEEK MEDICAL CARE IF:  The person has a feeding tube and the feeding tube residual amount is too high.  The person has a fever.  The person tries to avoid food, such as refusing to eat or be fed, or is eating less than normal. SEEK IMMEDIATE MEDICAL CARE IF:   The person has trouble breathing or starts to breathe quickly.  The person is breathing very slowly or stops breathing.  The person coughs a lot after eating or drinking.  The person has a long-lasting (chronic) cough.  The person coughs up thick, yellow, or tan sputum. MAKE SURE YOU:   Understand these instructions.  Will watch the person's condition.  Will get help right away if the person is not doing well or gets worse.   This information is not intended to replace advice given to you by your health care provider. Make sure you discuss any questions you have with your health care provider.   Document Released: 05/05/2010 Document Revised: 04/23/2014 Document Reviewed: 07/08/2013 Elsevier Interactive Patient Education 2016 Elsevier Inc.   Antibiotic Medicine Antibiotic medicines are used to treat infections caused by bacteria. They work by hurting or killing the germs that are making you sick. HOW WILL MY MEDICINE BE PICKED? There are many kinds of antibiotic medicines. To help your doctor pick one, tell your doctor if:  You have any allergies.  You are pregnant or plan to get pregnant.  You are breastfeeding.  You are taking any medicines. These include over-the-counter medicines, prescription medicines, and herbal remedies.  You have a medical condition or problem. If you have questions about why your medicine was picked, ask. FOR HOW LONG SHOULD I TAKE MY MEDICINE? Take your  medicine for as long as your doctor tells you to. Do not stop taking it when you feel better. If you stop taking it too soon:  You may start to feel sick again.  Your infection may get harder to treat.  New problems may develop. WHAT IF I MISS A DOSE? Try not to miss any doses of antibiotic medicine. If you miss a dose:  Take the dose as soon as you can.  If you are taking 2 doses a day, take the next dose in 5 to 6 hours.  If you are taking 3 or more doses a  day, take the next dose in 2 to 4 hours. Then go back to the normal schedule. If you cannot take a missed dose, take the next dose on time. Then take the missed dose after you have taken all the doses as told by your doctor, as if you had one more dose left. DOES THIS MEDICINE AFFECT BIRTH CONTROL? Birth control pills may not work while you are on antibiotic medicines. If you are taking birth control pills, keep taking them as usual. Use a second form of birth control, such as a condom. Keep using the second form of birth control until you are finished with your current 1 month cycle of birth control pills. GET HELP IF:  You get worse.  You do not feel better a few days after starting the medicine.  You throw up (vomit).  There are white patches in your mouth.  You have new joint pain after starting the medicine.  You have new muscle aches after starting the medicine.  You had a fever before starting the medicine, and it comes back.  You have any symptoms of an allergic reaction, such as an itchy rash. If this happens, stop taking the medicine. GET HELP RIGHT AWAY IF:  Your pee (urine) turns dark or becomes blood-colored.  Your skin turns yellow.  You bruise or bleed easily.  You have very bad watery poop (diarrhea) and cramps in your belly (abdomen).  You have a very bad headache.  You have signs of a very bad allergic reaction, such as:  Trouble breathing.  Wheezing.  Swelling of the lips, tongue, or  face.  Fainting.  Blisters on the skin or in the mouth. If you have signs of a very bad allergic reaction, stop taking the antibiotic medicine right away.   This information is not intended to replace advice given to you by your health care provider. Make sure you discuss any questions you have with your health care provider.   Document Released: 01/10/2008 Document Revised: 12/22/2014 Document Reviewed: 08/18/2014 Elsevier Interactive Patient Education 2016 Elsevier Inc.   Cellulitis Cellulitis is an infection of the skin and the tissue under the skin. The infected area is usually red and tender. This happens most often in the arms and lower legs. HOME CARE   Take your antibiotic medicine as told. Finish the medicine even if you start to feel better.  Keep the infected arm or leg raised (elevated).  Put a warm cloth on the area up to 4 times per day.  Only take medicines as told by your doctor.  Keep all doctor visits as told. GET HELP IF:  You see red streaks on the skin coming from the infected area.  Your red area gets bigger or turns a dark color.  Your bone or joint under the infected area is painful after the skin heals.  Your infection comes back in the same area or different area.  You have a puffy (swollen) bump in the infected area.  You have new symptoms.  You have a fever. GET HELP RIGHT AWAY IF:   You feel very sleepy.  You throw up (vomit) or have watery poop (diarrhea).  You feel sick and have muscle aches and pains.   This information is not intended to replace advice given to you by your health care provider. Make sure you discuss any questions you have with your health care provider.   Document Released: 09/19/2007 Document Revised: 12/22/2014 Document Reviewed: 06/18/2011 Elsevier Interactive Patient Education 2016  Elsevier Inc.  Thickening Liquids for Dysphagia Diet If you are on the dysphagia diet, you may need to thicken drinks, soups,  foods that melt at room temperature, and other liquids before you drink or eat them. Thickening liquids makes them easier to swallow. It also reduces the risk of liquid traveling to your lungs. To make a thickened liquid you will need to add a commercial thickening product or a soft food to the liquid until it reaches the consistency it needs to be. Your health care provider or dietitian will explain to you the consistency you need to aim for. Liquid consistencies include:  Thin. Thin liquids include most drinks (such as water, milk, tea, soda, juice, carbonated drinks), as well as ice cream, sherbet, sorbet, ice pops, and broth-based soups.  Nectar-like. Nectar-like liquids include maple syrup and creamy soup.  Honey-like. Honey-like liquids are made to be runny but are thick like honey. They cannot be sipped through a straw.  Spoon-thick. Spoon-thick liquids are thick, like pudding. MY PLAN I should thicken my liquids to a _______________ consistency. DIET GUIDELINES  Thicken liquids to the consistency your health care provider recommends.  Follow your dietitian's or health care provider's recommendation on how to thicken your liquids.  See your dietitian or health care provider regularly for help with your dietary changes. HOW CAN I THICKEN MY LIQUIDS? Liquids can be thickened with a commercial food and beverage thickener or with a soft food. Engineer, drilling Thickeners A food and beverage thickener is a powder or gel that makes a food or beverage thicker. Thickeners are sold at pharmacies, medical supply stores, some grocery stores, and online. They can be added to both hot and cold liquids and do not change the taste of the liquid. Ask your health care provider or dietitian for a complete list of commercial thickeners. Each thickening product is different. Some need to be blended into a liquid with a blender while others can be stirred into a liquid with a fork or spoon.  Follow the instructions on the product label. Soft Foods Some foods such as soups, casseroles, and gravies can be thickened with soft foods. Soft foods include:  Baby cereal.  Gravy powder.  Mashed potato.  Pureed baby food.  Instant potato flakes.  Powdered sauce mixes (such as cheese mixes).  Flour. To use one of these soft food items, stir or mix them into the thin liquid until it reaches the desired thickness. Start with a small amount and adjust soft food and liquid as necessary. Note: Flour works best with warm liquids, such as broth. To thicken a liquid with flour, make a paste out of flour and water. Cook or warm your liquid and add the paste to it. Stir until the mixture thickens. WHAT ARE SOME TIPS TO MAKE THICKENING LIQUIDS EASIER?  Take thickeners with you when eating out or traveling.  If a liquid gets too thick, add more of the thinner liquid until the desired consistency is reached.  Consider purchasing pre-made thickened drinks.  Consider using a thickening product to make your own frozen desserts.   This information is not intended to replace advice given to you by your health care provider. Make sure you discuss any questions you have with your health care provider.   Document Released: 10/02/2011 Document Revised: 04/07/2013 Document Reviewed: 03/16/2013 Elsevier Interactive Patient Education [employer]

## 2016-02-10 ENCOUNTER — Encounter: Payer: Self-pay | Admitting: Internal Medicine

## 2016-02-10 ENCOUNTER — Non-Acute Institutional Stay (SKILLED_NURSING_FACILITY): Payer: Medicare Other | Admitting: Internal Medicine

## 2016-02-10 DIAGNOSIS — I10 Essential (primary) hypertension: Secondary | ICD-10-CM | POA: Diagnosis not present

## 2016-02-10 DIAGNOSIS — E785 Hyperlipidemia, unspecified: Secondary | ICD-10-CM

## 2016-02-10 DIAGNOSIS — E118 Type 2 diabetes mellitus with unspecified complications: Secondary | ICD-10-CM

## 2016-02-10 DIAGNOSIS — G9341 Metabolic encephalopathy: Secondary | ICD-10-CM | POA: Diagnosis not present

## 2016-02-10 DIAGNOSIS — J189 Pneumonia, unspecified organism: Secondary | ICD-10-CM | POA: Diagnosis not present

## 2016-02-10 NOTE — Patient Instructions (Signed)
See specific orders & goals under each diagnosis 

## 2016-02-10 NOTE — Progress Notes (Signed)
[facility] performed on this date less than 30 days from date of admission. Included are preadmission medical/surgical history;reconciled medication list; family history; social history and comprehensive review of systems.  Corrections and additions to the records were documented . Comprehensive physical exam was also performed. Additionally a clinical summary was entered for each active diagnosis pertinent to this admission in the Problem List to enhance continuity of care.  PCP: Sanda Lingerhomas Jones M.D.  HPI: The patient was hospitalized 10/22-10/26/17 with progressively worse fatigue, congestion, and fever brought to the hospital by EMS. History was obtained from the nephew who stated that she been fatigued for approximately 3 weeks. In the 3 days prior to admission this progressed and was associated with fever ( documented temperature 101.1 in the emergency room) and productive cough.. White count was elevated at 14,400, hypokalemia was present with a value 2.7. Lactic acid was 2.25. She was placed on aztreonam, clindamycin,& vancomycin. Chest x-ray revealed bibasilar infiltrates which serially progressed. MRSA by PCR was negative she was changed to cefepime. As of discharge date blood cultures were negative and strep pneumo was negative. Hospital courses was complicated by possible aspiration event 10/24. She received IV Unasyn for anaerobic coverage. She was discharged on Augmentin although she describes and penicillin allergy. She was noted to have tenderness to palpation and associated erythema and edema of the wrist. Films revealed moderate soft tissue swelling with no osseous abnormality. Diagnosis of cellulitis was made. She has a past history of gout. There is no current uric acid in the chart. Clinically there was no evidence of a septic joint. She was felt to have an acute metabolic encephalopathy.CT of the head and cervical spine were  negative for acute process but did reveal very mild basal ganglia lacunar infarcts and chronic ischemic microvascular white matter disease. It also revealed chronic sinusitis  Sedating medications such as trazodone prescribed for anxiety/depression  was discontinued. She was diagnosed with dementia without behavioral disturbance. Aricept was continued at bedtime. She was given Effexor 37.5 mg daily. Palliative care consultation was completed. Verapamil was continued for essential hypertension. HCTZ he was discontinued because of hypokalemia. She was placed on H2-blockers in the hospital for symptomatic GERD. Diabetes was well-controlled as manifested by A1c of 6.4%. It was recommended that she have a carbohydrate modified diet without medications.   Past medical and surgical history: She has a diagnosis of COPD although she has never smoked. Surgical history is non-contributory. She had breast biopsy but there is no history of cancer.  Social history:Patient unable to provide verification  Family history: Patient unable to provide verification  Review of systems:Could not be completed due toprofound somnolence with minimal verbal communication. She did state that she had "some difficulty breathing". Initially she denied any complaints and simply patted her chest. When asked about the room temperature of 80, she admitted to being cold. She was unable to give me the date or tell me where she been hospitalized. ery eyes, significant sneezing, urticaria, angioedema  Physical exam:  Pertinent or positive findings:She is thin and appears chronically malnourished. Bilateral ptosis present when she was able to open her eyes.She has coarse rhonchi over the upper airway. The most striking physical founding was profound lethargy, intermittently dropping off to sleep. When I was able to wake her, responses were slow both verbally and in following commands.   There is dense plaque and coating over the  teeth. She has rales at the  bases. Posterior tibial pulses are slightly decreased. There is isolated deviation of the DIP joints. There is dramatic scoliosis and asymmetry of the thoracic spine inferiorly.  General appearance: no acute distress , increased work of breathing is present despite rhonchi .   Lymphatic: No lymphadenopathy about the head, neck, axilla  Eyes: No conjunctival inflammation or lid edema is present. There is no scleral icterus. Ears:  External ear exam shows no significant lesions or deformities.   Nose:  External nasal examination shows no deformity or inflammation. Nasal mucosa are pink and moist without lesions ,exudates Oral exam: lips and gums are healthy appearing. Neck:  No thyromegaly, masses, tenderness noted.    Heart:  No  gallop, murmur, click, rub .  Abdomen:Bowel sounds are normal. Abdomen is soft and nontender with no organomegaly, hernias,masses. GU: deferred . Extremities:  No cyanosis, clubbing,edema  Neurologic exam : Strength decreased in upper & lower extremities Balance,Rhomberg,finger to nose testing could not be completed due to clinical state Deep tendon reflexes are equal & 1+and brisk Skin: Warm & dry w/o tenting. No significant lesions or rash.  See clinical summary under each active problem in the Problem List with associated updated therapeutic plan

## 2016-02-10 NOTE — Assessment & Plan Note (Signed)
With lacunar infarcts and ischemic microvascular white matter disease demonstrated on CT scan, statin will be continued. Lipids are at essentially @ goal.

## 2016-02-10 NOTE — Assessment & Plan Note (Signed)
02/10/16 profound lethargy necessitates avoidance of any sedative agents or agents which would impact mental status

## 2016-02-10 NOTE — Assessment & Plan Note (Signed)
BP minimally elevated; continue monitor but no change in antihypertensive medications

## 2016-02-10 NOTE — Assessment & Plan Note (Signed)
Presently on Augmentin despite a history of PCN allergy. Change to clindamycin

## 2016-02-10 NOTE — Assessment & Plan Note (Signed)
A1c of 6.4% indicates excellent control diabetes. Dietary intervention only.

## 2016-02-11 LAB — CULTURE, BLOOD (ROUTINE X 2)
CULTURE: NO GROWTH
CULTURE: NO GROWTH

## 2016-02-14 LAB — TSH: TSH: 1.17 u[IU]/mL (ref 0.41–5.90)

## 2016-02-23 ENCOUNTER — Encounter: Payer: Self-pay | Admitting: *Deleted

## 2016-02-23 NOTE — Patient Outreach (Signed)
[facility] Care Management  02/23/2016  Sheila Cisneros 07/20/1940 [id number]  This RNCM attended Care plan meeting for patient, to establish goals of care and discharge. Family and facility staff in attendance. Patient will need 24/7 care if she discharges, family feels patient needs to remain long term care, but could not resolve or establish a POA. Nephew will assist patient with completing Medicaid application to have in place if patient to remain at facility after short term rehab is over.  Family leaning on patient remaining long term care, but no decision reached during meeting. RNCM will remain available to patient, family and facility for discharge planning needs if patient does discharge home.  Alben SpittleMary E. Albertha GheeNiemczura, RN, BSN, CCM  [facility] Post [facility] Coordinator [phone]

## 2016-02-28 ENCOUNTER — Encounter: Payer: Self-pay | Admitting: Internal Medicine

## 2016-02-28 ENCOUNTER — Encounter: Payer: Self-pay | Admitting: *Deleted

## 2016-02-28 ENCOUNTER — Non-Acute Institutional Stay (SKILLED_NURSING_FACILITY): Payer: Medicare Other | Admitting: Internal Medicine

## 2016-02-28 DIAGNOSIS — M436 Torticollis: Secondary | ICD-10-CM

## 2016-02-28 DIAGNOSIS — J42 Unspecified chronic bronchitis: Secondary | ICD-10-CM

## 2016-02-28 DIAGNOSIS — F0150 Vascular dementia without behavioral disturbance: Secondary | ICD-10-CM | POA: Diagnosis not present

## 2016-02-28 DIAGNOSIS — G9341 Metabolic encephalopathy: Secondary | ICD-10-CM

## 2016-02-28 DIAGNOSIS — R918 Other nonspecific abnormal finding of lung field: Secondary | ICD-10-CM

## 2016-02-28 NOTE — Progress Notes (Signed)
    Facility Location: [facility]  Room Number:111  Code Status: Full Code    This is a nursing facility follow up for specific acute issue of abnormal breath sounds.  Interim medical record and care since last [facility] visit was updated with review of diagnostic studies and change in clinical status since last visit were documented.  HPI: Physical therapy apparently heard rales on their evaluation. According to the nurse's aide the patient is not choking during meals. Her mental status waxes and wanes according to the staff. Apparently she will interact verbally with her family but basically sleeps most of the day.  Her labs were checked in late October. She exhibited a persistent leukocytosis and stable normochromic, normocytic anemia. Renal function was normal. Glucoses ranged from 123-151, A1c was prediabetic at 6.4%. TSH was therapeutic. Magnesium was reduced at 1.6.  Review of systems: I can get no history as patient was nonverbal  and she would not interact with me. I could not get her to open her eyes or mouth. According to the staff she is incontinent.  Physical exam:  Pertinent or positive findings: She is thin & suboptimally nourished .As noted she is nonverbal. I opened her eyes, she does have arcus senilis. She did not focus with the lids lifted but moved her eyes randomly. I could not open her mouth as she resisted the exam. Torticollis is suggested as her head and neck  are turned to the right. Minor rales were noted without increased respiratory distress. The right upper extremity was held flexed across her body and she resisted movement of the arm. There was slight cogwheeling. She exhibited intermittent tremor of both upper extremities. Posterior tibial pulses were decreased.  General appearance: no acute distress , increased work of breathing is present.   Lymphatic: No lymphadenopathy about the head, neck, axilla . Eyes: No  conjunctival inflammation or lid edema is present. There is no scleral icterus. Ears:  External ear exam shows no significant lesions or deformities.   Nose:  External nasal examination shows no deformity or inflammation. Nasal mucosa are pink and moist without lesions ,exudates Neck:  No thyromegaly, masses, tenderness noted.    Heart:  Normal rate and regular rhythm. S1 and S2 normal without gallop, murmur, click, rub .  Abdomen:Bowel sounds are normal. Abdomen is soft and nontender with no organomegaly, hernias,masses. GU: deferred  Extremities:  No cyanosis, clubbing,edema  Neurologic exam : Balance,Rhomberg,finger to nose testing could not be completed due to clinical state Skin: Warm & dry w/o tenting. No significant lesions or rash.    See summary under each active problem in the Problem List with associated updated therapeutic plan

## 2016-02-28 NOTE — Assessment & Plan Note (Signed)
Enteric-coated aspirin Continue statin

## 2016-02-28 NOTE — Assessment & Plan Note (Signed)
Physical Therapy noted rales, these were not clinically significant on my exam 02/27/69. The most striking finding was her altered mental status

## 2016-02-28 NOTE — Patient Outreach (Signed)
[facility]) Care Management  02/28/2016  Sheila Cisneros Mohammed Kindle [birth year] [id number]   Met with Cameron Ali, LCSW at [facility] for update on patient. Patient nephew has agreed to be POA and is working on [employer]. Discharge plan is still tentative. Will get update next week, as patient will need [facility] program services if she discharges home.  Royetta Crochet. Laymond Purser, RN, BSN, [place] Acute Care Coordinator [phone]

## 2016-02-28 NOTE — Assessment & Plan Note (Signed)
Chemistries will need to be rechecked

## 2016-02-28 NOTE — Patient Instructions (Signed)
See Current Assessment & Plan in Problem List under specific Diagnosis 

## 2016-02-28 NOTE — Assessment & Plan Note (Signed)
Most important is not to repeat imaging but to assess any reversible component or mental status changes with aspiration risk

## 2016-02-29 LAB — BASIC METABOLIC PANEL
BUN: 21 mg/dL (ref 4–21)
CREATININE: 0.9 mg/dL (ref 0.5–1.1)
Glucose: 181 mg/dL
POTASSIUM: 3.5 mmol/L (ref 3.4–5.3)
Sodium: 152 mmol/L — AB (ref 137–147)

## 2016-03-05 ENCOUNTER — Emergency Department (HOSPITAL_COMMUNITY): Payer: Medicare Other

## 2016-03-05 ENCOUNTER — Inpatient Hospital Stay (HOSPITAL_COMMUNITY)
Admission: EM | Admit: 2016-03-05 | Discharge: 2016-03-21 | DRG: 870 | Disposition: A | Discharge status: Skilled Nursing Facility | Payer: Medicare Other | Attending: Internal Medicine | Admitting: Internal Medicine

## 2016-03-05 DIAGNOSIS — J690 Pneumonitis due to inhalation of food and vomit: Secondary | ICD-10-CM | POA: Diagnosis present

## 2016-03-05 DIAGNOSIS — T17908S Unspecified foreign body in respiratory tract, part unspecified causing other injury, sequela: Secondary | ICD-10-CM | POA: Diagnosis not present

## 2016-03-05 DIAGNOSIS — B9562 Methicillin resistant Staphylococcus aureus infection as the cause of diseases classified elsewhere: Secondary | ICD-10-CM | POA: Diagnosis present

## 2016-03-05 DIAGNOSIS — M1990 Unspecified osteoarthritis, unspecified site: Secondary | ICD-10-CM | POA: Diagnosis present

## 2016-03-05 DIAGNOSIS — E11649 Type 2 diabetes mellitus with hypoglycemia without coma: Secondary | ICD-10-CM | POA: Diagnosis not present

## 2016-03-05 DIAGNOSIS — J431 Panlobular emphysema: Secondary | ICD-10-CM | POA: Diagnosis present

## 2016-03-05 DIAGNOSIS — K449 Diaphragmatic hernia without obstruction or gangrene: Secondary | ICD-10-CM | POA: Diagnosis present

## 2016-03-05 DIAGNOSIS — J9621 Acute and chronic respiratory failure with hypoxia: Secondary | ICD-10-CM | POA: Diagnosis not present

## 2016-03-05 DIAGNOSIS — J9811 Atelectasis: Secondary | ICD-10-CM | POA: Diagnosis not present

## 2016-03-05 DIAGNOSIS — E860 Dehydration: Secondary | ICD-10-CM | POA: Diagnosis present

## 2016-03-05 DIAGNOSIS — I10 Essential (primary) hypertension: Secondary | ICD-10-CM | POA: Diagnosis not present

## 2016-03-05 DIAGNOSIS — Z4659 Encounter for fitting and adjustment of other gastrointestinal appliance and device: Secondary | ICD-10-CM

## 2016-03-05 DIAGNOSIS — T17908A Unspecified foreign body in respiratory tract, part unspecified causing other injury, initial encounter: Secondary | ICD-10-CM | POA: Diagnosis not present

## 2016-03-05 DIAGNOSIS — J9600 Acute respiratory failure, unspecified whether with hypoxia or hypercapnia: Secondary | ICD-10-CM

## 2016-03-05 DIAGNOSIS — Z681 Body mass index (BMI) 19 or less, adult: Secondary | ICD-10-CM | POA: Diagnosis not present

## 2016-03-05 DIAGNOSIS — M6281 Muscle weakness (generalized): Secondary | ICD-10-CM | POA: Diagnosis not present

## 2016-03-05 DIAGNOSIS — K219 Gastro-esophageal reflux disease without esophagitis: Secondary | ICD-10-CM | POA: Diagnosis not present

## 2016-03-05 DIAGNOSIS — J449 Chronic obstructive pulmonary disease, unspecified: Secondary | ICD-10-CM | POA: Diagnosis not present

## 2016-03-05 DIAGNOSIS — R488 Other symbolic dysfunctions: Secondary | ICD-10-CM | POA: Diagnosis not present

## 2016-03-05 DIAGNOSIS — J470 Bronchiectasis with acute lower respiratory infection: Secondary | ICD-10-CM | POA: Diagnosis not present

## 2016-03-05 DIAGNOSIS — E870 Hyperosmolality and hypernatremia: Secondary | ICD-10-CM | POA: Diagnosis not present

## 2016-03-05 DIAGNOSIS — N179 Acute kidney failure, unspecified: Secondary | ICD-10-CM | POA: Diagnosis not present

## 2016-03-05 DIAGNOSIS — J189 Pneumonia, unspecified organism: Secondary | ICD-10-CM

## 2016-03-05 DIAGNOSIS — E876 Hypokalemia: Secondary | ICD-10-CM | POA: Diagnosis not present

## 2016-03-05 DIAGNOSIS — M109 Gout, unspecified: Secondary | ICD-10-CM | POA: Diagnosis not present

## 2016-03-05 DIAGNOSIS — J9690 Respiratory failure, unspecified, unspecified whether with hypoxia or hypercapnia: Secondary | ICD-10-CM | POA: Diagnosis not present

## 2016-03-05 DIAGNOSIS — E785 Hyperlipidemia, unspecified: Secondary | ICD-10-CM | POA: Diagnosis present

## 2016-03-05 DIAGNOSIS — Z7189 Other specified counseling: Secondary | ICD-10-CM

## 2016-03-05 DIAGNOSIS — F0390 Unspecified dementia without behavioral disturbance: Secondary | ICD-10-CM | POA: Diagnosis present

## 2016-03-05 DIAGNOSIS — E861 Hypovolemia: Secondary | ICD-10-CM | POA: Diagnosis present

## 2016-03-05 DIAGNOSIS — Z4682 Encounter for fitting and adjustment of non-vascular catheter: Secondary | ICD-10-CM | POA: Diagnosis not present

## 2016-03-05 DIAGNOSIS — E874 Mixed disorder of acid-base balance: Secondary | ICD-10-CM | POA: Diagnosis present

## 2016-03-05 DIAGNOSIS — M545 Low back pain: Secondary | ICD-10-CM | POA: Diagnosis present

## 2016-03-05 DIAGNOSIS — I110 Hypertensive heart disease with heart failure: Secondary | ICD-10-CM | POA: Diagnosis present

## 2016-03-05 DIAGNOSIS — E119 Type 2 diabetes mellitus without complications: Secondary | ICD-10-CM | POA: Diagnosis not present

## 2016-03-05 DIAGNOSIS — K8020 Calculus of gallbladder without cholecystitis without obstruction: Secondary | ICD-10-CM | POA: Diagnosis not present

## 2016-03-05 DIAGNOSIS — J810 Acute pulmonary edema: Secondary | ICD-10-CM

## 2016-03-05 DIAGNOSIS — R1310 Dysphagia, unspecified: Secondary | ICD-10-CM | POA: Diagnosis not present

## 2016-03-05 DIAGNOSIS — Z7401 Bed confinement status: Secondary | ICD-10-CM

## 2016-03-05 DIAGNOSIS — R0600 Dyspnea, unspecified: Secondary | ICD-10-CM | POA: Diagnosis not present

## 2016-03-05 DIAGNOSIS — R64 Cachexia: Secondary | ICD-10-CM | POA: Diagnosis present

## 2016-03-05 DIAGNOSIS — Z515 Encounter for palliative care: Secondary | ICD-10-CM | POA: Diagnosis not present

## 2016-03-05 DIAGNOSIS — G8929 Other chronic pain: Secondary | ICD-10-CM | POA: Diagnosis present

## 2016-03-05 DIAGNOSIS — J9601 Acute respiratory failure with hypoxia: Secondary | ICD-10-CM | POA: Diagnosis not present

## 2016-03-05 DIAGNOSIS — G9349 Other encephalopathy: Secondary | ICD-10-CM | POA: Diagnosis present

## 2016-03-05 DIAGNOSIS — A419 Sepsis, unspecified organism: Secondary | ICD-10-CM | POA: Diagnosis not present

## 2016-03-05 DIAGNOSIS — R918 Other nonspecific abnormal finding of lung field: Secondary | ICD-10-CM | POA: Diagnosis not present

## 2016-03-05 DIAGNOSIS — L8990 Pressure ulcer of unspecified site, unspecified stage: Secondary | ICD-10-CM | POA: Insufficient documentation

## 2016-03-05 DIAGNOSIS — R633 Feeding difficulties: Secondary | ICD-10-CM | POA: Diagnosis not present

## 2016-03-05 DIAGNOSIS — Z79899 Other long term (current) drug therapy: Secondary | ICD-10-CM

## 2016-03-05 DIAGNOSIS — Z978 Presence of other specified devices: Secondary | ICD-10-CM

## 2016-03-05 DIAGNOSIS — J15212 Pneumonia due to Methicillin resistant Staphylococcus aureus: Secondary | ICD-10-CM | POA: Diagnosis not present

## 2016-03-05 DIAGNOSIS — R1312 Dysphagia, oropharyngeal phase: Secondary | ICD-10-CM | POA: Diagnosis present

## 2016-03-05 DIAGNOSIS — I5032 Chronic diastolic (congestive) heart failure: Secondary | ICD-10-CM | POA: Diagnosis present

## 2016-03-05 DIAGNOSIS — R402421 Glasgow coma scale score 9-12, in the field [EMT or ambulance]: Secondary | ICD-10-CM | POA: Diagnosis not present

## 2016-03-05 DIAGNOSIS — Z8701 Personal history of pneumonia (recurrent): Secondary | ICD-10-CM

## 2016-03-05 DIAGNOSIS — Z431 Encounter for attention to gastrostomy: Secondary | ICD-10-CM | POA: Diagnosis not present

## 2016-03-05 DIAGNOSIS — R531 Weakness: Secondary | ICD-10-CM | POA: Diagnosis not present

## 2016-03-05 DIAGNOSIS — D649 Anemia, unspecified: Secondary | ICD-10-CM | POA: Diagnosis present

## 2016-03-05 LAB — GLUCOSE, CAPILLARY
GLUCOSE-CAPILLARY: 213 mg/dL — AB (ref 65–99)
Glucose-Capillary: 207 mg/dL — ABNORMAL HIGH (ref 65–99)
Glucose-Capillary: 239 mg/dL — ABNORMAL HIGH (ref 65–99)

## 2016-03-05 LAB — COMPREHENSIVE METABOLIC PANEL
ALK PHOS: 100 U/L (ref 38–126)
ALT: 21 U/L (ref 14–54)
AST: 21 U/L (ref 15–41)
Albumin: 1.9 g/dL — ABNORMAL LOW (ref 3.5–5.0)
Anion gap: 7 (ref 5–15)
BUN: 22 mg/dL — ABNORMAL HIGH (ref 6–20)
CALCIUM: 9.1 mg/dL (ref 8.9–10.3)
CHLORIDE: 129 mmol/L — AB (ref 101–111)
CO2: 25 mmol/L (ref 22–32)
CREATININE: 1.12 mg/dL — AB (ref 0.44–1.00)
GFR, EST AFRICAN AMERICAN: 55 mL/min — AB (ref >60)
GFR, EST NON AFRICAN AMERICAN: 47 mL/min — AB (ref >60)
Glucose, Bld: 170 mg/dL — ABNORMAL HIGH (ref 65–99)
Potassium: 3.4 mmol/L — ABNORMAL LOW (ref 3.5–5.1)
Sodium: 161 mmol/L (ref 135–145)
TOTAL PROTEIN: 6 g/dL — AB (ref 6.5–8.1)
Total Bilirubin: 0.4 mg/dL (ref 0.3–1.2)

## 2016-03-05 LAB — BASIC METABOLIC PANEL
ANION GAP: 10 (ref 5–15)
ANION GAP: 7 (ref 5–15)
BUN: 27 mg/dL — AB (ref 4–21)
BUN: 24 mg/dL — ABNORMAL HIGH (ref 6–20)
BUN: 31 mg/dL — ABNORMAL HIGH (ref 6–20)
CALCIUM: 8.6 mg/dL — AB (ref 8.9–10.3)
CALCIUM: 8.1 mg/dL — AB (ref 8.9–10.3)
CO2: 20 mmol/L — ABNORMAL LOW (ref 22–32)
CO2: 18 mmol/L — AB (ref 22–32)
CREATININE: 1.1 mg/dL (ref 0.5–1.1)
Chloride: 129 mmol/L — ABNORMAL HIGH (ref 101–111)
Chloride: 130 mmol/L — ABNORMAL HIGH (ref 101–111)
Creatinine, Ser: 1.32 mg/dL — ABNORMAL HIGH (ref 0.44–1.00)
Creatinine, Ser: 1.57 mg/dL — ABNORMAL HIGH (ref 0.44–1.00)
GFR, EST AFRICAN AMERICAN: 45 mL/min — AB (ref >60)
GFR, EST AFRICAN AMERICAN: 36 mL/min — AB (ref >60)
GFR, EST NON AFRICAN AMERICAN: 39 mL/min — AB (ref >60)
GFR, EST NON AFRICAN AMERICAN: 31 mL/min — AB (ref >60)
GLUCOSE: 174 mg/dL
GLUCOSE: 226 mg/dL — AB (ref 65–99)
GLUCOSE: 239 mg/dL — AB (ref 65–99)
POTASSIUM: 3.5 mmol/L (ref 3.4–5.3)
POTASSIUM: 3.8 mmol/L (ref 3.5–5.1)
POTASSIUM: 3.3 mmol/L — AB (ref 3.5–5.1)
SODIUM: 164 mmol/L — AB (ref 137–147)
SODIUM: 159 mmol/L — AB (ref 135–145)
Sodium: 155 mmol/L — ABNORMAL HIGH (ref 135–145)

## 2016-03-05 LAB — URINALYSIS, ROUTINE W REFLEX MICROSCOPIC
Bilirubin Urine: NEGATIVE
GLUCOSE, UA: NEGATIVE mg/dL
HGB URINE DIPSTICK: NEGATIVE
Ketones, ur: NEGATIVE mg/dL
Leukocytes, UA: NEGATIVE
Nitrite: NEGATIVE
PROTEIN: NEGATIVE mg/dL
Specific Gravity, Urine: 1.023 (ref 1.005–1.030)
pH: 6 (ref 5.0–8.0)

## 2016-03-05 LAB — CBC AND DIFFERENTIAL
HCT: 37 % (ref 36–46)
Hemoglobin: 11.6 g/dL — AB (ref 12.0–16.0)
Platelets: 227 10*3/uL (ref 150–399)
WBC: 14.8 10*3/mL

## 2016-03-05 LAB — LACTIC ACID, PLASMA
Lactic Acid, Venous: 3.5 mmol/L (ref 0.5–1.9)
Lactic Acid, Venous: 3.9 mmol/L (ref 0.5–1.9)

## 2016-03-05 LAB — I-STAT ARTERIAL BLOOD GAS, ED
Acid-base deficit: 4 mmol/L — ABNORMAL HIGH (ref 0.0–2.0)
BICARBONATE: 21.9 mmol/L (ref 20.0–28.0)
O2 SAT: 100 %
PCO2 ART: 42.9 mmHg (ref 32.0–48.0)
PH ART: 7.316 — AB (ref 7.350–7.450)
PO2 ART: 279 mmHg — AB (ref 83.0–108.0)
TCO2: 23 mmol/L (ref 0–100)

## 2016-03-05 LAB — CBC WITH DIFFERENTIAL/PLATELET
Basophils Absolute: 0 10*3/uL (ref 0.0–0.1)
Basophils Relative: 0 %
Eosinophils Absolute: 0 10*3/uL (ref 0.0–0.7)
Eosinophils Relative: 0 %
HEMATOCRIT: 36 % (ref 36.0–46.0)
HEMOGLOBIN: 11 g/dL — AB (ref 12.0–15.0)
LYMPHS ABS: 2.5 10*3/uL (ref 0.7–4.0)
LYMPHS PCT: 16 %
MCH: 27.5 pg (ref 26.0–34.0)
MCHC: 30.6 g/dL (ref 30.0–36.0)
MCV: 90 fL (ref 78.0–100.0)
Monocytes Absolute: 0.7 10*3/uL (ref 0.1–1.0)
Monocytes Relative: 4 %
NEUTROS ABS: 12.7 10*3/uL — AB (ref 1.7–7.7)
NEUTROS PCT: 80 %
Platelets: 275 10*3/uL (ref 150–400)
RBC: 4 MIL/uL (ref 3.87–5.11)
RDW: 15.1 % (ref 11.5–15.5)
WBC: 15.9 10*3/uL — AB (ref 4.0–10.5)

## 2016-03-05 LAB — PHOSPHORUS
PHOSPHORUS: 3.4 mg/dL (ref 2.5–4.6)
Phosphorus: 2.6 mg/dL (ref 2.5–4.6)

## 2016-03-05 LAB — TROPONIN I
Troponin I: 0.03 ng/mL (ref <0.03)
Troponin I: 0.03 ng/mL (ref <0.03)

## 2016-03-05 LAB — I-STAT CG4 LACTIC ACID, ED
Lactic Acid, Venous: 1.62 mmol/L (ref 0.5–1.9)
Lactic Acid, Venous: 4.96 mmol/L (ref 0.5–1.9)

## 2016-03-05 LAB — PROCALCITONIN: Procalcitonin: 1.52 ng/mL

## 2016-03-05 LAB — MAGNESIUM
MAGNESIUM: 1.7 mg/dL (ref 1.7–2.4)
MAGNESIUM: 1.7 mg/dL (ref 1.7–2.4)

## 2016-03-05 LAB — CREATININE, SERUM
CREATININE: 1.32 mg/dL — AB (ref 0.44–1.00)
GFR calc Af Amer: 45 mL/min — ABNORMAL LOW (ref >60)
GFR, EST NON AFRICAN AMERICAN: 39 mL/min — AB (ref >60)

## 2016-03-05 LAB — MRSA PCR SCREENING: MRSA by PCR: POSITIVE — AB

## 2016-03-05 LAB — CBG MONITORING, ED: GLUCOSE-CAPILLARY: 178 mg/dL — AB (ref 65–99)

## 2016-03-05 LAB — BRAIN NATRIURETIC PEPTIDE: B NATRIURETIC PEPTIDE 5: 33 pg/mL (ref 0.0–100.0)

## 2016-03-05 MED ORDER — PRO-STAT SUGAR FREE PO LIQD
30.0000 mL | Freq: Two times a day (BID) | ORAL | Status: DC
  Administered 2016-03-05: 30 mL
  Filled 2016-03-05 (×2): qty 30

## 2016-03-05 MED ORDER — VITAL AF 1.2 CAL PO LIQD
1000.0000 mL | ORAL | Status: DC
  Administered 2016-03-05 – 2016-03-10 (×6): 1000 mL
  Filled 2016-03-05 (×2): qty 1000

## 2016-03-05 MED ORDER — FENTANYL CITRATE (PF) 100 MCG/2ML IJ SOLN
50.0000 ug | INTRAMUSCULAR | Status: AC | PRN
  Administered 2016-03-05 – 2016-03-08 (×3): 50 ug via INTRAVENOUS
  Filled 2016-03-05 (×2): qty 2

## 2016-03-05 MED ORDER — DEXTROSE 5 % IV SOLN
1.0000 g | Freq: Once | INTRAVENOUS | Status: AC
  Administered 2016-03-05: 1 g via INTRAVENOUS
  Filled 2016-03-05: qty 1

## 2016-03-05 MED ORDER — POTASSIUM CHLORIDE 2 MEQ/ML IV SOLN
30.0000 meq | Freq: Once | INTRAVENOUS | Status: AC
  Administered 2016-03-05: 30 meq via INTRAVENOUS
  Filled 2016-03-05: qty 15

## 2016-03-05 MED ORDER — FREE WATER
100.0000 mL | Freq: Four times a day (QID) | Status: DC
  Administered 2016-03-05 – 2016-03-07 (×7): 100 mL

## 2016-03-05 MED ORDER — INSULIN ASPART 100 UNIT/ML ~~LOC~~ SOLN
2.0000 [IU] | SUBCUTANEOUS | Status: DC
  Administered 2016-03-05 – 2016-03-06 (×4): 6 [IU] via SUBCUTANEOUS
  Administered 2016-03-06 (×2): 4 [IU] via SUBCUTANEOUS
  Administered 2016-03-06 (×2): 2 [IU] via SUBCUTANEOUS
  Administered 2016-03-07: 4 [IU] via SUBCUTANEOUS
  Administered 2016-03-07 – 2016-03-09 (×13): 2 [IU] via SUBCUTANEOUS
  Administered 2016-03-09: 4 [IU] via SUBCUTANEOUS
  Administered 2016-03-10 (×2): 2 [IU] via SUBCUTANEOUS

## 2016-03-05 MED ORDER — DEXTROSE 5 % IV SOLN
1.0000 g | INTRAVENOUS | Status: DC
  Administered 2016-03-06 – 2016-03-08 (×3): 1 g via INTRAVENOUS
  Filled 2016-03-05 (×4): qty 1

## 2016-03-05 MED ORDER — NOREPINEPHRINE BITARTRATE 1 MG/ML IV SOLN: 0.0000 ug/min | Freq: Once | INTRAVENOUS | Status: DC

## 2016-03-05 MED ORDER — ACETAMINOPHEN 650 MG RE SUPP
650.0000 mg | Freq: Once | RECTAL | Status: AC
  Administered 2016-03-05: 650 mg via RECTAL
  Filled 2016-03-05: qty 1

## 2016-03-05 MED ORDER — NOREPINEPHRINE BITARTRATE 1 MG/ML IV SOLN
0.0000 ug/min | INTRAVENOUS | Status: DC
  Administered 2016-03-05: 3 ug/min via INTRAVENOUS
  Administered 2016-03-05: 2 ug/min via INTRAVENOUS
  Filled 2016-03-05: qty 4

## 2016-03-05 MED ORDER — NOREPINEPHRINE BITARTRATE 1 MG/ML IV SOLN
0.0000 ug/min | Freq: Once | INTRAVENOUS | Status: AC
  Administered 2016-03-05: 10 ug/min via INTRAVENOUS
  Filled 2016-03-05: qty 4

## 2016-03-05 MED ORDER — SODIUM CHLORIDE 0.9 % IV BOLUS (SEPSIS)
1000.0000 mL | Freq: Once | INTRAVENOUS | Status: AC
  Administered 2016-03-05: 1000 mL via INTRAVENOUS

## 2016-03-05 MED ORDER — ORAL CARE MOUTH RINSE
15.0000 mL | OROMUCOSAL | Status: DC
  Administered 2016-03-05 – 2016-03-11 (×58): 15 mL via OROMUCOSAL

## 2016-03-05 MED ORDER — SODIUM CHLORIDE 0.9 % IV BOLUS (SEPSIS)
1000.0000 mL | Freq: Once | INTRAVENOUS | Status: AC
Start: 2016-03-05 — End: 2016-03-05
  Administered 2016-03-05: 1000 mL via INTRAVENOUS

## 2016-03-05 MED ORDER — MIDAZOLAM HCL 2 MG/2ML IJ SOLN: 1.0000 mg | INTRAMUSCULAR | Status: DC | PRN

## 2016-03-05 MED ORDER — FENTANYL CITRATE (PF) 100 MCG/2ML IJ SOLN
50.0000 ug | INTRAMUSCULAR | Status: DC | PRN
  Administered 2016-03-05 – 2016-03-09 (×4): 50 ug via INTRAVENOUS
  Filled 2016-03-05 (×4): qty 2

## 2016-03-05 MED ORDER — ETOMIDATE 2 MG/ML IV SOLN
INTRAVENOUS | Status: DC | PRN
  Administered 2016-03-05: 15 mg via INTRAVENOUS

## 2016-03-05 MED ORDER — MIDAZOLAM HCL 2 MG/2ML IJ SOLN
1.0000 mg | INTRAMUSCULAR | Status: DC | PRN
  Administered 2016-03-05: 1 mg via INTRAVENOUS

## 2016-03-05 MED ORDER — IPRATROPIUM-ALBUTEROL 0.5-2.5 (3) MG/3ML IN SOLN
3.0000 mL | Freq: Four times a day (QID) | RESPIRATORY_TRACT | Status: DC
  Administered 2016-03-05 – 2016-03-13 (×34): 3 mL via RESPIRATORY_TRACT
  Filled 2016-03-05 (×33): qty 3

## 2016-03-05 MED ORDER — MIDAZOLAM HCL 2 MG/2ML IJ SOLN
INTRAMUSCULAR | Status: AC
  Filled 2016-03-05: qty 2

## 2016-03-05 MED ORDER — SODIUM CHLORIDE 0.9 % IV SOLN
250.0000 mL | INTRAVENOUS | Status: DC | PRN
  Administered 2016-03-10: 250 mL via INTRAVENOUS

## 2016-03-05 MED ORDER — PANTOPRAZOLE SODIUM 40 MG IV SOLR
40.0000 mg | INTRAVENOUS | Status: DC
  Administered 2016-03-05 – 2016-03-11 (×7): 40 mg via INTRAVENOUS
  Filled 2016-03-05 (×8): qty 40

## 2016-03-05 MED ORDER — CHLORHEXIDINE GLUCONATE 0.12% ORAL RINSE (MEDLINE KIT)
15.0000 mL | Freq: Two times a day (BID) | OROMUCOSAL | Status: DC
  Administered 2016-03-05 – 2016-03-11 (×12): 15 mL via OROMUCOSAL

## 2016-03-05 MED ORDER — HEPARIN SODIUM (PORCINE) 5000 UNIT/ML IJ SOLN
5000.0000 [IU] | Freq: Three times a day (TID) | INTRAMUSCULAR | Status: DC
  Administered 2016-03-05 – 2016-03-18 (×38): 5000 [IU] via SUBCUTANEOUS
  Filled 2016-03-05 (×39): qty 1

## 2016-03-05 MED ORDER — VITAL HIGH PROTEIN PO LIQD
1000.0000 mL | ORAL | Status: DC
  Administered 2016-03-05: 1000 mL

## 2016-03-05 MED ORDER — POTASSIUM CL IN DEXTROSE 5% 20 MEQ/L IV SOLN
20.0000 meq | INTRAVENOUS | Status: DC
  Filled 2016-03-05: qty 1000

## 2016-03-05 MED ORDER — BUDESONIDE 0.5 MG/2ML IN SUSP
0.5000 mg | Freq: Two times a day (BID) | RESPIRATORY_TRACT | Status: DC
  Administered 2016-03-05 – 2016-03-21 (×31): 0.5 mg via RESPIRATORY_TRACT
  Filled 2016-03-05 (×32): qty 2

## 2016-03-05 MED ORDER — VANCOMYCIN HCL IN DEXTROSE 1-5 GM/200ML-% IV SOLN
1000.0000 mg | Freq: Once | INTRAVENOUS | Status: AC
  Administered 2016-03-05: 1000 mg via INTRAVENOUS
  Filled 2016-03-05: qty 200

## 2016-03-05 MED ORDER — POTASSIUM CL IN DEXTROSE 5% 20 MEQ/L IV SOLN
20.0000 meq | INTRAVENOUS | Status: DC
  Administered 2016-03-05: 20 meq via INTRAVENOUS
  Filled 2016-03-05: qty 1000

## 2016-03-05 MED ORDER — FENTANYL CITRATE (PF) 100 MCG/2ML IJ SOLN
INTRAMUSCULAR | Status: AC
  Administered 2016-03-07: 50 ug via INTRAVENOUS
  Filled 2016-03-05: qty 2

## 2016-03-05 MED ORDER — ROCURONIUM BROMIDE 50 MG/5ML IV SOLN
INTRAVENOUS | Status: DC | PRN
  Administered 2016-03-05: 100 mg via INTRAVENOUS

## 2016-03-05 NOTE — Sedation Documentation (Addendum)
ETT placed, confirmed by auscultation and color change. Measures 22 at bottom lip

## 2016-03-05 NOTE — ED Notes (Signed)
Patient moved physically from B16 to Trauma A; patient placed back on monitor, continuous pulse oximetry, blood pressure cuff, oxygen [place] (6L) and NRB (15L) per Sharon SellerLloyd, MD; visitors were taken to consultation room B

## 2016-03-05 NOTE — ED Notes (Signed)
Assisted Brooke, RN with in and out cath and rectal temperature; cleaned patient and placed a clean dry diaper on her and 2 chuks underneath patient; visitors at bedside

## 2016-03-05 NOTE — H&P (Addendum)
PULMONARY / CRITICAL CARE MEDICINE   Name: Sheila Cisneros MRN: [id number] DOB: 1940-09-26    ADMISSION DATE:  03/05/2016 CONSULTATION DATE:  11/20  REFERRING MD:  Dr. Adela Lank   CHIEF COMPLAINT:  AMS, PNA   HISTORY OF PRESENT ILLNESS:  Patient unresponsive, family not at bedside, information obtained from staff and medical record   75 year old female with PMH of GERD, Dementia, Type 2 DM, COPD, HTN, and Hyperlipidemia presents to the ED on 11/20 from [facility]. At baseline patient is non-verbal and has had multiple aspiration events. Yesterday, due to patient clinical status and recent admission for PNA from 10/22-10/26, a CXR was obtained which showed PNA. This morning patient was febrile and more altered then typical at facility, so was sent to ED. Upon arrival patient was alert on 2L [place], during stay in ED patient had an aspiration event where oxygenation was 78%, post-suctioning patient remained minimally responsive and hypotension - requiring intubation. PCCM called to admit.    PAST MEDICAL HISTORY :  She  has a past medical history of Altered mental status (12/09/2012); Arthritis; Chronic bronchitis (HCC); Chronic lower back pain; COPD (chronic obstructive pulmonary disease) (HCC); Diabetes mellitus without complication (HCC); Exertional shortness of breath; GERD (gastroesophageal reflux disease); Gout, unspecified; Hepatitis, unspecified; History of blood transfusion; Hyperlipidemia; Hypertension; Migraines; Osteoarthrosis, unspecified whether generalized or localized, unspecified site; Other chronic sinusitis; Pneumonia; and Unspecified chronic bronchitis.  PAST SURGICAL HISTORY: She  has a past surgical history that includes Tubal ligation; Tonsillectomy; Shoulder arthroscopy w/ rotator cuff repair (Right); Carpal tunnel release (09/06/2011); Abdominal hysterectomy; Appendectomy; Breast biopsy (Right); and Esophagogastroduodenoscopy (N/A, 06/04/2013).  Allergies  Allergen Reactions  .  Codeine Rash     angioedema  . Neomycin Itching  . Penicillins Itching and Rash       . Tetanus Toxoids Rash  . Levofloxacin Rash    No current facility-administered medications on file prior to encounter.    Current Outpatient Prescriptions on File Prior to Encounter  Medication Sig  . acetaminophen (TYLENOL) 500 MG tablet Take 500 mg by mouth every 6 (six) hours as needed. For pain  . albuterol (PROVENTIL) (2.5 MG/3ML) 0.083% nebulizer solution 3 ml every 4 hours scheduled x 14 days then 3 ml four times daily and every 4 hours as needed for SOB/Wheezing  . Ascorbic Acid (VITAMIN C) 500 MG tablet Take 500 mg by mouth daily.    . cholecalciferol (VITAMIN D) 1000 UNITS tablet Take 1,000 Units by mouth daily.  Marland Kitchen donepezil (ARICEPT) 5 MG tablet TAKE 1 TABLET (5 MG TOTAL) BY MOUTH AT BEDTIME.  Marland Kitchen glucose blood test strip Use to check blood sugar once daily. (Patient not taking: Reported on 02/28/2016)  . Multiple Vitamin (MULTIVITAMIN) capsule Take 1 capsule by mouth daily.    . Nutritional Supplements (NUTRITIONAL DRINK PO) Take by mouth. Med Pass 120 mL three times daily  . simvastatin (ZOCOR) 20 MG tablet Take 1 tablet (20 mg total) by mouth at bedtime.  . Tiotropium Bromide-Olodaterol (STIOLTO RESPIMAT) 2.5-2.5 MCG/ACT AERS Inhale 2 puffs into the lungs daily.  Marland Kitchen UNABLE TO FIND Med Name: Magic Cup once daily  . venlafaxine (EFFEXOR) 37.5 MG tablet Take 37.5 mg by mouth daily as needed (hot flashes).  . verapamil (COVERA HS) 240 MG (CO) 24 hr tablet Take 1 tablet (240 mg total) by mouth at bedtime.    FAMILY HISTORY:  Her indicated that her mother is deceased. She indicated that her father is deceased. She indicated that her  sister is alive. She indicated that her brother is deceased. She indicated that her maternal grandmother is deceased. She indicated that her maternal grandfather is deceased. She indicated that her paternal grandmother is deceased. She indicated that her paternal  grandfather is deceased.    SOCIAL HISTORY: She  reports that she has never smoked. She has never used smokeless tobacco. She reports that she drinks alcohol. She reports that she does not use drugs.  REVIEW OF SYSTEMS:   ROS Unable to obtain. Patient non-responsive and no family available.   SUBJECTIVE:  Elderly female on vent, sedated.   VITAL SIGNS: BP 110/60   Pulse 87   Temp 101.4 F (38.6 C) (Rectal)   Resp 18   Ht 5\' 2"  (1.575 m)   Wt 47.2 kg (104 lb)   SpO2 100%   BMI 19.02 kg/m   HEMODYNAMICS:    VENTILATOR SETTINGS: Vent Mode: PRVC FiO2 (%):  [100 %] 100 % Set Rate:  [18 bmp] 18 bmp Vt Set:  [400 mL] 400 mL PEEP:  [5 cmH20] 5 cmH20 Plateau Pressure:  [22 cmH20] 22 cmH20  INTAKE / OUTPUT: No intake/output data recorded.  PHYSICAL EXAMINATION: General: Critically ill,  Elderly female, sedated  Neuro: Sedated, obtunded, does not follow commands, pupils 2 mm sluggish  HEENT: ETT in place  Cardiovascular: s1/s2, no MRG, RRR Lungs: diminished at bases  Abdomen: non-tender, active bowel sounds  Musculoskeletal: no deformities  Skin: warm, dry, intact   LABS:  BMET  Recent Labs Lab 03/05/16 1133  NA 161*  K 3.4*  CL 129*  CO2 25  BUN 22*  CREATININE 1.12*  GLUCOSE 170*    Electrolytes  Recent Labs Lab 03/05/16 1133  CALCIUM 9.1    CBC  Recent Labs Lab 03/05/16 1020  WBC 15.9*  HGB 11.0*  HCT 36.0  PLT 275    Coag's No results for input(s): APTT, INR in the last 168 hours.  Sepsis Markers  Recent Labs Lab 03/05/16 1028 03/05/16 1305  LATICACIDVEN 1.62 4.96*    ABG No results for input(s): PHART, PCO2ART, PO2ART in the last 168 hours.  Liver Enzymes  Recent Labs Lab 03/05/16 1133  AST 21  ALT 21  ALKPHOS 100  BILITOT 0.4  ALBUMIN 1.9*    Cardiac Enzymes No results for input(s): TROPONINI, PROBNP in the last 168 hours.  Glucose  Recent Labs Lab 03/05/16 1153  GLUCAP 178*    Imaging Dg Chest  Portable 1 View  Result Date: 03/05/2016 CLINICAL DATA:  Intubated EXAM: PORTABLE CHEST 1 VIEW COMPARISON:  03/05/2016 10 o'clock hour FINDINGS: Endotracheal tube placed with its tip 4.7 cm from the carina. NG tube placed with its tip in the fundus. Normal heart size. Hazy bilateral central and basilar consolidation is stable. No pneumothorax. IMPRESSION: Endotracheal and NG tubes placed. Stable bilateral airspace disease. Electronically Signed   By: Jolaine Click M.D.   On: 03/05/2016 12:47   Dg Chest Portable 1 View  Result Date: 03/05/2016 CLINICAL DATA:  Altered mental status, fever EXAM: PORTABLE CHEST 1 VIEW COMPARISON:  02/05/2016 FINDINGS: Heart is upper limits normal in size. There are bilateral lower lobe airspace opacities, increasing in the right base since prior study. No visible effusions or acute bony abnormality. Degenerative changes in the shoulders. IMPRESSION: Bibasilar opacities, increasing on the right since prior study concerning for pneumonia. Electronically Signed   By: Charlett Nose M.D.   On: 03/05/2016 10:49   STUDIES:  11/20 CXR > Bibasilar opacities, increasing  on the right since prior study concerning for pneumonia  CULTURES: Blood 11/20>> Urine 11/20>> Sputum 11/20 >>  ANTIBIOTICS: Vancomycin 11/20>> Cefepime 11/20 >>  SIGNIFICANT EVENTS: 10/22-10/26: Admission for HCAP   LINES/TUBES:   DISCUSSION: 75 year old female with recent admission for PNA. At baseline patient has severe dementia and is non-verbal. Recently patient has had multiple events of aspiration. Presents to ED from SNF with diagnosis of PNA. During stay in ED patient had an aspiration event requiring intubation.   ASSESSMENT / PLAN:  PULMONARY A: Acute on Chronic Respiratory Failure secondary to Aspiration (Patient has a history of multiple aspiration events) Recent Admission for PNA (10/22-10/26) H/O COPD, bronchiectasis, Mass of left lower lung  P:   Vent Support ABG now  CXR in  AM Antibiotics as listed below   CARDIOVASCULAR A:  Hypotension in setting of sepsis  H/O HTN, Hyperlipidemia  P:  Cardiac Monitoring  Titrate Levophed to maintain MAP >65 Hold all home HTN medications   RENAL A:   Acute Kidney Injury (Baseline Cret 0.7-0.8)  - possibly secondary to dehydration  Hypernatremia  Hypokalemia  P:   Trend BMP Replace electrolytes as needed  d5w with 20K @ 50 mls/hr. Start free water aw well.   GASTROINTESTINAL A:   H/O GERD, Dysphagia (Multiple aspiration events)  P:   PPI  Will need formal swallow evaluation once extubated   HEMATOLOGIC A:   No issues  P:  Trend CBC   INFECTIOUS A:   Lactic Acidosis (s/p 2 L NS) Leukocytosis  Sepsis secondary to aspiration PNA P:   Trend WBC and fever curve Trend Lactic Acid and Procal PAN Culture  Continue Cefepime   ENDOCRINE A:   H/O Type 2 DM P:   SSI  Q4H CBG   NEUROLOGIC A:   Encephalopathy secondary to PNA  H/O Dementia - Baseline non-verbal  P:   Versed and Fentanyl PRN to achieve RASS goal  Hold home Effexor and verapamil  RASS goal: 0    FAMILY  - Updates:   - Inter-disciplinary family meet or Palliative Care meeting due by:  03/12/16   Jovita KussmaulKatalina Eubanks, AG-ACNP [facility]  Pgr: [phone]  PCCM Pgr: [phone]   ATTENDING NOTE / ATTESTATION NOTE :   I have discussed the case with the resident/APP  Jovita KussmaulKatalina Eubanks.   I agree with the resident/APP's  history, physical examination, assessment, and plans.    I have edited the above note and modified it according to our agreed history, physical examination, assessment and plan.   Briefly, pt from SNF, admitted for asp pna.  Intubated for repeated asp at the ED and for resp failure and unable to protect her airway. Post intubation, BP remained stable. Also with AKI and hypernatremia.   PE as above. BP 130/70 on 3 mcg of levophed, HR 80, RR 20, T 101. 100% sats on 50% Fio2. Pt seen  sedated, not in distress. Comfortable. ETT in place. Rhonchi in BULF. Good s1/s2. Good BS, soft. Warm extremities. Rest of exam as above.   Acute Hypoxemic resp failure 2/2 asp pna (HCAP) given pt is from SNF and recent hospitalization in 10/2-17. Cont vent support. Keep o2 sats > 88%. Cont broad spectrum abx. Neb meds. Wean off levophed. Start TF. May need trache and PEG as if  it appears this is a chronic  issue AKI 2/2 infection, dehydration. Hypernatremia likely 2/2 dehydration. Start free water. Cont D5W at 50 mls/hr. Check BMET q 4-6.  Start TF.  Lactic acidosis. Initially was N but rpt was elevated likely related to WOB. Rpt lactate. Pt has received 2L NS anyways and seems to be volume resuscitated. Will check troponin and echo as well.   I have spent 30 minutes of critical care time with this patient today.  Family :  No family at bedside.    Pollie MeyerJ. Angelo A de Dios, MD 03/05/2016, 2:55 PM  Pulmonary and Critical Care Pager [phone] After 3 pm or if no answer, call [phone]

## 2016-03-05 NOTE — ED Notes (Signed)
Pt family member notified RN that pt had gurgling noises in throat. This RN entered room, SBP 78, SpO2 78% on 2L [place]. This RN suctioned airway, notified MD. Dr. Adela LankFloyd at bedside. Family states pt is full code and would like for staff to proceed with intubation. Pt moved to trauma room. Pt remains hypotensive, not protecting airway, minimally responsive.

## 2016-03-05 NOTE — Progress Notes (Signed)
eLink Physician-Brief Progress Note Patient Name: Sheila Cisneros DOB: Oct 16, 1940 MRN: [id number]   Date of Service  03/05/2016  HPI/Events of Note  Multiple issues: 1. Troponin = 0.03. Demand ischemia?, 2. BP = 86/52 and 3. Lactic Acid = 3.5 >> 3.9.  eICU Interventions  Will order: 1. Continue to trend Troponin. 2. 0.9 NaCl 1 liter IV over 1 hour now.  3. Titrate Norepinephrine IV infusion to maintain MAP >= 65.     Intervention Category Intermediate Interventions: Diagnostic test evaluation  Sommer,Steven Dennard Nipugene 03/05/2016, 7:27 PM

## 2016-03-05 NOTE — ED Notes (Signed)
Assisted Brooke, RN with turning patient so she could administer medication to the patient; also assisted Clydie BraunKaren, Phlb with holding patient's arm to draw labs

## 2016-03-05 NOTE — ED Provider Notes (Signed)
INTUBATION Performed by: Cherlynn PerchesEric Derryl Uher  Required items: required blood products, implants, devices, and special equipment available Patient identity confirmed: provided demographic data and hospital-assigned identification number Time out: Immediately prior to procedure a "time out" was called to verify the correct patient, procedure, equipment, support staff and site/side marked as required.  Indications: Acute hypoxic respiratory failure and airway protection  Intubation method: Mac 4 Laryngoscopy   Preoxygenation: [place] and BVM  Sedatives: Etomidate Paralytic: Roccuronium  Tube Size: 7.5 cuffed  Post-procedure assessment: chest rise and ETCO2 monitor Breath sounds: equal and absent over the epigastrium Tube secured with: ETT holder Chest x-ray interpreted by radiologist and me.  Chest x-ray findings: endotracheal tube in appropriate position  Patient tolerated the procedure well with no immediate complications.   Procedure note: Ultrasound Guided Peripheral IV Ultrasound guided peripheral 1.88 inch angiocath IV placement performed by me. Indications: Nursing unable to place IV. Details: The antecubital fossa and upper arm were evaluated with a multifrequency linear probe. Patent brachial veins were noted. 1 attempt was made to cannulate a vein under realtime US guidance with successful cannulation of the vein and catheter placement. There is return of non-pulsatile dark red blood. The patient tolerated the procedure well without complications. Images archived electronically.  CPT codes: 1610976937 and 36410        Cherlynn PerchesEric Naiomi Musto, MD 03/05/16 1424    Melene Planan Floyd, DO 03/05/16 [phone]

## 2016-03-05 NOTE — ED Triage Notes (Signed)
Per EMS [facility]. Chest xray done yesterday, showed pneumonia. Per family, pt AMS this morning (hx dementia but normally talks some). Fever w/ EMS, warm to touch.

## 2016-03-05 NOTE — Progress Notes (Signed)
Pharmacy Antibiotic Note  Sheila Cisneros is a 75 y.o. female admitted on 03/05/2016 with pneumonia.  Pharmacy has been consulted for cefepime dosing. Tmax 101.4 and WBC is elevated at 15.9. Lactic acid is >4 and Scr is 1.12.   Plan: - Cefepime 1gm IV Q24H - F/u renal fxn, C&S, clinical status and trough at SS  Height: 5\' 2"  (157.5 cm) Weight: 104 lb (47.2 kg) IBW/kg (Calculated) : 50.1  Temp (24hrs), Avg:101.4 F (38.6 C), Min:101.4 F (38.6 C), Max:101.4 F (38.6 C)   Recent Labs Lab 03/05/16 1020 03/05/16 1028 03/05/16 1133 03/05/16 1305  WBC 15.9*  --   --   --   CREATININE  --   --  1.12*  --   LATICACIDVEN  --  1.62  --  4.96*    Estimated Creatinine Clearance: 32.8 mL/min (by C-G formula based on SCr of 1.12 mg/dL (H)).    Allergies  Allergen Reactions  . Codeine Rash     angioedema  . Neomycin Itching  . Penicillins Itching and Rash       . Tetanus Toxoids Rash  . Levofloxacin Rash    Antimicrobials this admission: Vanc x 1 11/20 Cefepime 11/20>>  Dose adjustments this admission: N/A  Microbiology results: Pending  Thank you for allowing pharmacy to be a part of this patient's care.  Sophia Sperry, Drake LeachRachel Lynn 03/05/2016 2:25 PM

## 2016-03-05 NOTE — Progress Notes (Signed)
eLink Physician-Brief Progress Note Patient Name: Sheila Cisneros DOB: [birth year] MRN: [id number]   Date of Service  03/05/2016  HPI/Events of Note  K+ = 3.3 , Na+ = 159 >> 155 and Creatinine = 1.57.  eICU Interventions  Will replace K+.     Intervention Category Major Interventions: Electrolyte abnormality - evaluation and management  Sommer,Steven Eugene 03/05/2016, 10:24 PM

## 2016-03-05 NOTE — Progress Notes (Signed)
Initial Nutrition Assessment  DOCUMENTATION CODES:   Not applicable  INTERVENTION:    Initiate TF via OGT with Vital AF 1.2 at goal rate of 45 ml/h (1080 ml per day) to provide 1296 kcals, 81 gm protein, 876 ml free water daily.  NUTRITION DIAGNOSIS:   Inadequate oral intake related to inability to eat as evidenced by NPO status.  GOAL:   Patient will meet greater than or equal to 90% of their needs  MONITOR:   Vent status, Labs, TF tolerance, Skin, I & O's  REASON FOR ASSESSMENT:   Consult Enteral/tube feeding initiation and management  ASSESSMENT:   75 year old female with recent admission for PNA. At baseline patient has severe dementia and is non-verbal. Recently patient has had multiple events of aspiration. Presents to ED from SNF with diagnosis of PNA. During stay in ED patient had an aspiration event requiring intubation.   Patient is currently intubated on ventilator support MV: 8.2 L/min Temp (24hrs), Avg:99.6 F (37.6 C), Min:97.8 F (36.6 C), Max:101.4 F (38.6 C)  Received MD Consult for TF initiation and management. Unable to complete Nutrition-Focused physical exam at this time.  Labs reviewed: sodium elevated. CBG's: 178-213 Medications reviewed and include KCl in D5 infusion.  Diet Order:  Diet NPO time specified  Skin:  Wound (see comment) (stage II to buttocks)  Last BM:  PTA  Height:   Ht Readings from Last 1 Encounters:  03/05/16 5\' 2"  (1.575 m)    Weight:   Wt Readings from Last 1 Encounters:  03/05/16 104 lb (47.2 kg)    Ideal Body Weight:  50 kg  BMI:  Body mass index is 19.02 kg/m.  Estimated Nutritional Needs:   Kcal:  1382  Protein:  70-80 gm  Fluid:  1.5 L  EDUCATION NEEDS:   No education needs identified at this time  Joaquin CourtsKimberly Harris, RD, LDN, CNSC Pager [phone] After Hours Pager [phone]

## 2016-03-05 NOTE — ED Provider Notes (Addendum)
[facility]-EMERGENCY DEPT Provider Note   CSN: [id number] Arrival date & time: 03/05/16  1012     History   Chief Complaint Chief Complaint  Patient presents with  . Pneumonia    HPI Sheila Cisneros is a 75 y.o. female.  Level V caveat altered mental status. Patient is a 75 year old female who has recently in the hospital for pneumonia. She was discharged to a skilled nursing facility where she was noted to have pneumonia diagnosed by chest x-ray yesterday. Family visited her today and felt that she was not as responsive as normal. She is normally conversant but is not talking to her family this morning. They deny any worsening shortness of breath. Deny cough or congestion.   The history is provided by the patient.  Altered Mental Status   This is a new problem. The current episode started 3 to 5 hours ago. The problem has not changed since onset.Associated symptoms include unresponsiveness. Risk factors include a recent illness and a recent infection. Her past medical history does not include seizures or head trauma.    Past Medical History:  Diagnosis Date  . Altered mental status 12/09/2012  . Arthritis    "all over" (12/09/2012)  . Chronic bronchitis (HCC)    "couple times/yr" (12/09/2012)  . Chronic lower back pain   . COPD (chronic obstructive pulmonary disease) (HCC)   . Diabetes mellitus without complication (HCC)   . Exertional shortness of breath   . GERD (gastroesophageal reflux disease)    rolaids if needed  . Gout, unspecified   . Hepatitis, unspecified    "the one that's not bad" (12/09/2012)  . History of blood transfusion    "related to a surgery, I think" (12/09/2012)  . Hyperlipidemia   . Hypertension   . Migraines    "in the past" (12/09/2012)  . Osteoarthrosis, unspecified whether generalized or localized, unspecified site   . Other chronic sinusitis   . Pneumonia    "couple times; long time ago" (12/09/2012)  . Unspecified chronic bronchitis    sees Dr. Melba Coon, last visit 08/2011, treated for E. Coli- resp. /w Ceftin, Feb. 2013    Patient Active Problem List   Diagnosis Date Noted  . Aspiration pneumonia (HCC) 03/05/2016  . Torticollis 02/28/2016  . Encephalopathy, metabolic 02/07/2016  . HCAP (healthcare-associated pneumonia) 02/05/2016  . Bronchiectasis (HCC) 06/18/2015  . Insomnia due to anxiety and fear 06/09/2015  . Hearing loss 05/02/2015  . Mass of lower lobe of left lung 04/06/2015  . COPD (chronic obstructive pulmonary disease) (HCC) 03/23/2015  . Other screening mammogram 08/04/2013  . Duodenal ulcer 08/03/2013  . Hypokalemia 08/03/2013  . Hyperlipidemia with target LDL less than 100   . Type II diabetes mellitus with manifestations (HCC) 06/21/2013  . Scoliosis (and kyphoscoliosis), idiopathic 12/12/2012  . Vascular dementia 12/10/2012  . GERD (gastroesophageal reflux disease) 12/09/2012  . GOUT 03/07/2007  . Essential hypertension 03/07/2007    Past Surgical History:  Procedure Laterality Date  . ABDOMINAL HYSTERECTOMY     "w/right ovary" (12/09/2012)  . APPENDECTOMY    . BREAST BIOPSY Right   . CARPAL TUNNEL RELEASE  09/06/2011   Procedure: CARPAL TUNNEL RELEASE;  Surgeon: Kennieth Rad, MD;  Location: [facility] OR;  Service: Orthopedics;  Laterality: Right;  . ESOPHAGOGASTRODUODENOSCOPY N/A 06/04/2013   Procedure: ESOPHAGOGASTRODUODENOSCOPY (EGD);  Surgeon: Graylin Shiver, MD;  Location[facility] ENDOSCOPY;  Service: Endoscopy;  Laterality: N/A;  . SHOULDER ARTHROSCOPY W/ ROTATOR CUFF REPAIR Right   .  TONSILLECTOMY    . TUBAL LIGATION      OB History    No data available       Home Medications    Prior to Admission medications   Medication Sig Start Date End Date Taking? Authorizing Provider  acetaminophen (TYLENOL) 500 MG tablet Take 500 mg by mouth every 6 (six) hours as needed. For pain    Historical Provider, MD  albuterol (PROVENTIL) (2.5 MG/3ML) 0.083% nebulizer solution 3 ml every 4 hours scheduled x 14 days  then 3 ml four times daily and every 4 hours as needed for SOB/Wheezing    Historical Provider, MD  Ascorbic Acid (VITAMIN C) 500 MG tablet Take 500 mg by mouth daily.      Historical Provider, MD  cholecalciferol (VITAMIN D) 1000 UNITS tablet Take 1,000 Units by mouth daily.    Historical Provider, MD  donepezil (ARICEPT) 5 MG tablet TAKE 1 TABLET (5 MG TOTAL) BY MOUTH AT BEDTIME. 07/04/15   Etta Grandchild, MD  glucose blood test strip Use to check blood sugar once daily. Patient not taking: Reported on 02/28/2016 05/02/15   Etta Grandchild, MD  Multiple Vitamin (MULTIVITAMIN) capsule Take 1 capsule by mouth daily.      Historical Provider, MD  Nutritional Supplements (NUTRITIONAL DRINK PO) Take by mouth. Med Pass 120 mL three times daily    Historical Provider, MD  simvastatin (ZOCOR) 20 MG tablet Take 1 tablet (20 mg total) by mouth at bedtime. 11/15/14   Etta Grandchild, MD  Tiotropium Bromide-Olodaterol (STIOLTO RESPIMAT) 2.5-2.5 MCG/ACT AERS Inhale 2 puffs into the lungs daily. 10/17/15   Etta Grandchild, MD  UNABLE TO FIND Med Name: Magic Cup once daily    Historical Provider, MD  venlafaxine [facility]) 37.5 MG tablet Take 37.5 mg by mouth daily as needed (hot flashes).    Historical Provider, MD  verapamil (COVERA HS) 240 MG (CO) 24 hr tablet Take 1 tablet (240 mg total) by mouth at bedtime. 11/15/14   Etta Grandchild, MD    Family History Family History  Problem Relation Age of Onset  . Heart disease Father   . ALS Brother     Social History Social History  Substance Use Topics  . Smoking status: Never Smoker  . Smokeless tobacco: Never Used  . Alcohol use Yes     Comment: 12/09/2012 "have a taste yearly"     Allergies   Codeine; Neomycin; Penicillins; Tetanus toxoids; and Levofloxacin   Review of Systems Review of Systems  Unable to perform ROS: Patient nonverbal  Constitutional: Negative for chills and fever.  HENT: Negative for congestion and rhinorrhea.   Eyes: Negative for  redness and visual disturbance.  Respiratory: Positive for shortness of breath. Negative for wheezing.   Cardiovascular: Negative for chest pain and palpitations.  Gastrointestinal: Negative for nausea and vomiting.  Genitourinary: Negative for dysuria and urgency.  Musculoskeletal: Negative for arthralgias and myalgias.  Skin: Negative for pallor and wound.  Neurological: Negative for dizziness and headaches.     Physical Exam Updated Vital Signs BP 126/69   Pulse 80   Temp 101.4 F (38.6 C) (Rectal)   Resp 18   Ht 5\' 2"  (1.575 m)   Wt 104 lb (47.2 kg)   SpO2 100%   BMI 19.02 kg/m   Physical Exam  Constitutional: She appears well-developed and well-nourished. No distress.  HENT:  Head: Normocephalic and atraumatic.  Eyes: EOM are normal. Pupils are equal, round, and reactive to  light.  Neck: Normal range of motion. Neck supple.  Cardiovascular: Normal rate and regular rhythm.  Exam reveals no gallop and no friction rub.   No murmur heard. Pulmonary/Chest: Effort normal. She has no wheezes. She has no rales.  Rhonci bases  Abdominal: Soft. She exhibits no distension. There is no tenderness.  Musculoskeletal: She exhibits no edema or tenderness.  Neurological: She is alert. GCS eye subscore is 4. GCS verbal subscore is 1. GCS motor subscore is 5.  The patient is nonverbal. She moves her eyes and acknowledges people talking to her that makes no complete response. Localizes to pain.  Skin: Skin is warm and dry. She is not diaphoretic.  Psychiatric: She has a normal mood and affect. Her behavior is normal.  Nursing note and vitals reviewed.    ED Treatments / Results  Labs (all labs ordered are listed, but only abnormal results are displayed) Labs Reviewed  CBC WITH DIFFERENTIAL/PLATELET - Abnormal; Notable for the following:       Result Value   WBC 15.9 (*)    Hemoglobin 11.0 (*)    Neutro Abs 12.7 (*)    All other components within normal limits  COMPREHENSIVE  METABOLIC PANEL - Abnormal; Notable for the following:    Sodium 161 (*)    Potassium 3.4 (*)    Chloride 129 (*)    Glucose, Bld 170 (*)    BUN 22 (*)    Creatinine, Ser 1.12 (*)    Total Protein 6.0 (*)    Albumin 1.9 (*)    GFR calc non Af Amer 47 (*)    GFR calc Af Amer 55 (*)    All other components within normal limits  CBG MONITORING, ED - Abnormal; Notable for the following:    Glucose-Capillary 178 (*)    All other components within normal limits  I-STAT CG4 LACTIC ACID, ED - Abnormal; Notable for the following:    Lactic Acid, Venous 4.96 (*)    All other components within normal limits  I-STAT ARTERIAL BLOOD GAS, ED - Abnormal; Notable for the following:    pH, Arterial 7.316 (*)    pO2, Arterial 279.0 (*)    Acid-base deficit 4.0 (*)    All other components within normal limits  CULTURE, BLOOD (ROUTINE X 2)  CULTURE, BLOOD (ROUTINE X 2)  URINE CULTURE  CULTURE, RESPIRATORY (NON-EXPECTORATED)  URINALYSIS, ROUTINE W REFLEX MICROSCOPIC (NOT AT [facility])  CREATININE, SERUM  LACTIC ACID, PLASMA  LACTIC ACID, PLASMA  BLOOD GAS, ARTERIAL  MAGNESIUM  PHOSPHORUS  BASIC METABOLIC PANEL  I-STAT CG4 LACTIC ACID, ED    EKG  EKG Interpretation  Date/Time:  Monday March 05 2016 10:19:01 EST Ventricular Rate:  109 PR Interval:    QRS Duration: 72 QT Interval:  395 QTC Calculation: 532 R Axis:   52 Text Interpretation:  Sinus tachycardia Nonspecific T abnormalities, lateral leads No significant change since last tracing Confirmed by Shavon Ashmore MD, DANIEL [phone]([id number]) on 03/05/2016 10:41:05 AM       Radiology Dg Chest Portable 1 View  Result Date: 03/05/2016 CLINICAL DATA:  Intubated EXAM: PORTABLE CHEST 1 VIEW COMPARISON:  03/05/2016 10 o'clock hour FINDINGS: Endotracheal tube placed with its tip 4.7 cm from the carina. NG tube placed with its tip in the fundus. Normal heart size. Hazy bilateral central and basilar consolidation is stable. No pneumothorax. IMPRESSION:  Endotracheal and NG tubes placed. Stable bilateral airspace disease. Electronically Signed   By: Jolaine ClickArthur  Hoss M.D.   On:  03/05/2016 12:47   Dg Chest Portable 1 View  Result Date: 03/05/2016 CLINICAL DATA:  Altered mental status, fever EXAM: PORTABLE CHEST 1 VIEW COMPARISON:  02/05/2016 FINDINGS: Heart is upper limits normal in size. There are bilateral lower lobe airspace opacities, increasing in the right base since prior study. No visible effusions or acute bony abnormality. Degenerative changes in the shoulders. IMPRESSION: Bibasilar opacities, increasing on the right since prior study concerning for pneumonia. Electronically Signed   By: Charlett Nose M.D.   On: 03/05/2016 10:49    Procedures Procedures (including critical care time)  Medications Ordered in ED Medications  fentaNYL (SUBLIMAZE) injection 50 mcg (50 mcg Intravenous Given 03/05/16 1234)  fentaNYL (SUBLIMAZE) injection 50 mcg (not administered)  midazolam (VERSED) injection 1 mg (1 mg Intravenous Given 03/05/16 1234)  midazolam (VERSED) injection 1 mg (not administered)  etomidate (AMIDATE) injection (15 mg Intravenous Given 03/05/16 1225)  rocuronium (ZEMURON) injection (100 mg Intravenous Given 03/05/16 1226)  0.9 %  sodium chloride infusion (not administered)  heparin injection 5,000 Units (not administered)  insulin aspart (novoLOG) injection 2-6 Units (not administered)  pantoprazole (PROTONIX) injection 40 mg (not administered)  vancomycin (VANCOCIN) IVPB 1000 mg/200 mL premix (0 mg Intravenous Stopped 03/05/16 1245)  ceFEPIme (MAXIPIME) 1 g in dextrose 5 % 50 mL IVPB (0 g Intravenous Stopped 03/05/16 1330)  acetaminophen (TYLENOL) suppository 650 mg (650 mg Rectal Given 03/05/16 1127)  sodium chloride 0.9 % bolus 1,000 mL (0 mLs Intravenous Stopped 03/05/16 1252)  norepinephrine (LEVOPHED) 4 mg in dextrose 5 % 250 mL (0.016 mg/mL) infusion (5 mcg/min Intravenous Rate/Dose Change 03/05/16 1229)  sodium chloride 0.9 %  bolus 1,000 mL (1,000 mLs Intravenous New Bag/Given 03/05/16 1256)     Initial Impression / Assessment and Plan / ED Course  I have reviewed the triage vital signs and the nursing notes.  Pertinent labs & imaging results that were available during my care of the patient were reviewed by me and considered in my medical decision making (see chart for details).  Clinical Course     75 yo F With a chief complaint of altered mental status. Found to have pneumonia on chest x-ray here. Patient is in a nursing home will start her on HCAP Antibiotics. While in the ED the patient had a likely aspiration event. Became profoundly hypoxic and blood pressure dropped into the 70s. Patient was not protecting her airway on reassessment. Patient was intubated after discussion with the family who stated that she is full code. Patient's blood pressure was persistently low despite IV fluids prior to intubation. The patient was placed on norepinephrine just prior to attempt. Blood pressure after intubation was normal and the patient was titrated down.  CRITICAL CARE Performed by: Rae Roam   Total critical care time: 80 minutes  Critical care time was exclusive of separately billable procedures and treating other patients.  Critical care was necessary to treat or prevent imminent or life-threatening deterioration.  Critical care was time spent personally by me on the following activities: development of treatment plan with patient and/or surrogate as well as nursing, discussions with consultants, evaluation of patient's response to treatment, examination of patient, obtaining history from patient or surrogate, ordering and performing treatments and interventions, ordering and review of laboratory studies, ordering and review of radiographic studies, pulse oximetry and re-evaluation of patient's condition.   The patients results and plan were reviewed and discussed.   Any x-rays performed were  independently reviewed by myself.   Differential  diagnosis were considered with the presenting HPI.  Medications  fentaNYL (SUBLIMAZE) injection 50 mcg (50 mcg Intravenous Given 03/05/16 1234)  fentaNYL (SUBLIMAZE) injection 50 mcg (not administered)  midazolam (VERSED) injection 1 mg (1 mg Intravenous Given 03/05/16 1234)  midazolam (VERSED) injection 1 mg (not administered)  etomidate (AMIDATE) injection (15 mg Intravenous Given 03/05/16 1225)  rocuronium (ZEMURON) injection (100 mg Intravenous Given 03/05/16 1226)  0.9 %  sodium chloride infusion (not administered)  heparin injection 5,000 Units (not administered)  insulin aspart (novoLOG) injection 2-6 Units (not administered)  pantoprazole (PROTONIX) injection 40 mg (not administered)  vancomycin (VANCOCIN) IVPB 1000 mg/200 mL premix (0 mg Intravenous Stopped 03/05/16 1245)  ceFEPIme (MAXIPIME) 1 g in dextrose 5 % 50 mL IVPB (0 g Intravenous Stopped 03/05/16 1330)  acetaminophen (TYLENOL) suppository 650 mg (650 mg Rectal Given 03/05/16 1127)  sodium chloride 0.9 % bolus 1,000 mL (0 mLs Intravenous Stopped 03/05/16 1252)  norepinephrine (LEVOPHED) 4 mg in dextrose 5 % 250 mL (0.016 mg/mL) infusion (5 mcg/min Intravenous Rate/Dose Change 03/05/16 1229)  sodium chloride 0.9 % bolus 1,000 mL (1,000 mLs Intravenous New Bag/Given 03/05/16 1256)    Vitals:   03/05/16 1315 03/05/16 1320 03/05/16 1330 03/05/16 1345  BP: 138/73 118/62 123/67 126/69  Pulse: 85 83 81 80  Resp: 16 18 18 18   Temp:      TempSrc:      SpO2: 100% 100% 100% 100%  Weight:      Height:        Final diagnoses:  Acute respiratory failure with hypoxia (HCC)  HCAP (healthcare-associated pneumonia)    Admission/ observation were discussed with the admitting physician, patient and/or family and they are comfortable with the plan.    Final Clinical Impressions(s) / ED Diagnoses   Final diagnoses:  Acute respiratory failure with hypoxia (HCC)  HCAP  (healthcare-associated pneumonia)    New Prescriptions New Prescriptions   No medications on file     Melene Planan Cassandria Drew, DO 03/05/16 1411    Melene Planan Da Michelle, DO 03/05/16 1412

## 2016-03-05 NOTE — ED Notes (Signed)
Patient arrived already in gown, placed on monitor, continuous pulse oximetry, blood pressure cuff and oxygen [place] (2L); EKG performed

## 2016-03-06 ENCOUNTER — Inpatient Hospital Stay (HOSPITAL_COMMUNITY): Payer: Medicare Other

## 2016-03-06 DIAGNOSIS — R0600 Dyspnea, unspecified: Secondary | ICD-10-CM

## 2016-03-06 LAB — BASIC METABOLIC PANEL
ANION GAP: 5 (ref 5–15)
ANION GAP: 7 (ref 5–15)
Anion gap: 9 (ref 5–15)
Anion gap: 7 (ref 5–15)
BUN: 34 mg/dL — ABNORMAL HIGH (ref 6–20)
BUN: 34 mg/dL — ABNORMAL HIGH (ref 6–20)
BUN: 33 mg/dL — AB (ref 6–20)
BUN: 36 mg/dL — AB (ref 6–20)
BUN: 34 mg/dL — AB (ref 6–20)
CALCIUM: 8 mg/dL — AB (ref 8.9–10.3)
CALCIUM: 8.1 mg/dL — AB (ref 8.9–10.3)
CALCIUM: 8.4 mg/dL — AB (ref 8.9–10.3)
CHLORIDE: 127 mmol/L — AB (ref 101–111)
CHLORIDE: 125 mmol/L — AB (ref 101–111)
CO2: 21 mmol/L — AB (ref 22–32)
CO2: 18 mmol/L — ABNORMAL LOW (ref 22–32)
CO2: 20 mmol/L — AB (ref 22–32)
CO2: 20 mmol/L — AB (ref 22–32)
CO2: 20 mmol/L — AB (ref 22–32)
CREATININE: 1.54 mg/dL — AB (ref 0.44–1.00)
CREATININE: 1.5 mg/dL — AB (ref 0.44–1.00)
Calcium: 8.3 mg/dL — ABNORMAL LOW (ref 8.9–10.3)
Calcium: 7.8 mg/dL — ABNORMAL LOW (ref 8.9–10.3)
Chloride: 129 mmol/L — ABNORMAL HIGH (ref 101–111)
Chloride: 130 mmol/L — ABNORMAL HIGH (ref 101–111)
Creatinine, Ser: 1.8 mg/dL — ABNORMAL HIGH (ref 0.44–1.00)
Creatinine, Ser: 1.52 mg/dL — ABNORMAL HIGH (ref 0.44–1.00)
Creatinine, Ser: 1.59 mg/dL — ABNORMAL HIGH (ref 0.44–1.00)
GFR calc Af Amer: 37 mL/min — ABNORMAL LOW (ref >60)
GFR calc Af Amer: 36 mL/min — ABNORMAL LOW (ref >60)
GFR calc Af Amer: 38 mL/min — ABNORMAL LOW (ref >60)
GFR calc non Af Amer: 32 mL/min — ABNORMAL LOW (ref >60)
GFR calc non Af Amer: 31 mL/min — ABNORMAL LOW (ref >60)
GFR calc non Af Amer: 33 mL/min — ABNORMAL LOW (ref >60)
GFR, EST AFRICAN AMERICAN: 31 mL/min — AB (ref >60)
GFR, EST AFRICAN AMERICAN: 38 mL/min — AB (ref >60)
GFR, EST NON AFRICAN AMERICAN: 27 mL/min — AB (ref >60)
GFR, EST NON AFRICAN AMERICAN: 33 mL/min — AB (ref >60)
GLUCOSE: 177 mg/dL — AB (ref 65–99)
GLUCOSE: 220 mg/dL — AB (ref 65–99)
Glucose, Bld: 244 mg/dL — ABNORMAL HIGH (ref 65–99)
Glucose, Bld: 191 mg/dL — ABNORMAL HIGH (ref 65–99)
Glucose, Bld: 158 mg/dL — ABNORMAL HIGH (ref 65–99)
POTASSIUM: 3.7 mmol/L (ref 3.5–5.1)
POTASSIUM: 3.5 mmol/L (ref 3.5–5.1)
POTASSIUM: 3.3 mmol/L — AB (ref 3.5–5.1)
POTASSIUM: 3.3 mmol/L — AB (ref 3.5–5.1)
Potassium: 3.4 mmol/L — ABNORMAL LOW (ref 3.5–5.1)
SODIUM: 156 mmol/L — AB (ref 135–145)
Sodium: 155 mmol/L — ABNORMAL HIGH (ref 135–145)
Sodium: 155 mmol/L — ABNORMAL HIGH (ref 135–145)
Sodium: 157 mmol/L — ABNORMAL HIGH (ref 135–145)
Sodium: 152 mmol/L — ABNORMAL HIGH (ref 135–145)

## 2016-03-06 LAB — BLOOD GAS, ARTERIAL
Acid-base deficit: 4 mmol/L — ABNORMAL HIGH (ref 0.0–2.0)
BICARBONATE: 19.3 mmol/L — AB (ref 20.0–28.0)
Drawn by: 418751
FIO2: 40
LHR: 20 {breaths}/min
O2 SAT: 99 %
PCO2 ART: 28.4 mmHg — AB (ref 32.0–48.0)
PEEP/CPAP: 5 cmH2O
PH ART: 7.447 (ref 7.350–7.450)
Patient temperature: 98.6
VT: 400 mL
pO2, Arterial: 143 mmHg — ABNORMAL HIGH (ref 83.0–108.0)

## 2016-03-06 LAB — URINE CULTURE: CULTURE: NO GROWTH

## 2016-03-06 LAB — ECHOCARDIOGRAM COMPLETE
Ao-asc: 30 cm
CHL CUP DOP CALC LVOT VTI: 16.1 cm
CHL CUP TV REG PEAK VELOCITY: 222 cm/s
E decel time: 285 msec
EERAT: 5.73
FS: 33 % (ref 28–44)
HEIGHTINCHES: 62 in
IV/PV OW: 1.09
LA diam end sys: 39 mm
LA diam index: 2.58 cm/m2
LA vol: 31.2 mL
LASIZE: 39 mm
LAVOLA4C: 32.1 mL
LAVOLIN: 20.7 mL/m2
LDCA: 2.27 cm2
LV E/e'average: 5.73
LV TDI E'LATERAL: 10.1
LV TDI E'MEDIAL: 6.31
LVEEMED: 5.73
LVELAT: 10.1 cm/s
LVOT diameter: 17 mm
LVOTPV: 82.4 cm/s
LVOTSV: 37 mL
MV Dec: 285
MV pk A vel: 70.2 m/s
MVPKEVEL: 57.9 m/s
PW: 8.55 mm — AB (ref 0.6–1.1)
TAPSE: 15 mm
TR max vel: 222 cm/s
WEIGHTICAEL: 1823.65 [oz_av]

## 2016-03-06 LAB — GLUCOSE, CAPILLARY
GLUCOSE-CAPILLARY: 206 mg/dL — AB (ref 65–99)
Glucose-Capillary: 165 mg/dL — ABNORMAL HIGH (ref 65–99)
Glucose-Capillary: 131 mg/dL — ABNORMAL HIGH (ref 65–99)
Glucose-Capillary: 167 mg/dL — ABNORMAL HIGH (ref 65–99)
Glucose-Capillary: 133 mg/dL — ABNORMAL HIGH (ref 65–99)

## 2016-03-06 LAB — LACTIC ACID, PLASMA
Lactic Acid, Venous: 2.3 mmol/L (ref 0.5–1.9)
Lactic Acid, Venous: 1.4 mmol/L (ref 0.5–1.9)

## 2016-03-06 LAB — MAGNESIUM
MAGNESIUM: 1.6 mg/dL — AB (ref 1.7–2.4)
MAGNESIUM: 2.3 mg/dL (ref 1.7–2.4)

## 2016-03-06 LAB — CBC
HEMATOCRIT: 29.2 % — AB (ref 36.0–46.0)
HEMOGLOBIN: 8.9 g/dL — AB (ref 12.0–15.0)
MCH: 27.3 pg (ref 26.0–34.0)
MCHC: 30.5 g/dL (ref 30.0–36.0)
MCV: 89.6 fL (ref 78.0–100.0)
Platelets: 169 10*3/uL (ref 150–400)
RBC: 3.26 MIL/uL — ABNORMAL LOW (ref 3.87–5.11)
RDW: 15.3 % (ref 11.5–15.5)
WBC: 15.7 10*3/uL — AB (ref 4.0–10.5)

## 2016-03-06 LAB — PHOSPHORUS
Phosphorus: 1.9 mg/dL — ABNORMAL LOW (ref 2.5–4.6)
Phosphorus: 1.8 mg/dL — ABNORMAL LOW (ref 2.5–4.6)
Phosphorus: 3 mg/dL (ref 2.5–4.6)

## 2016-03-06 LAB — TROPONIN I
TROPONIN I: 0.05 ng/mL — AB (ref <0.03)
Troponin I: 0.04 ng/mL (ref <0.03)

## 2016-03-06 LAB — PROCALCITONIN: PROCALCITONIN: 5.54 ng/mL

## 2016-03-06 MED ORDER — POTASSIUM PHOSPHATES 15 MMOLE/5ML IV SOLN
20.0000 mmol | Freq: Once | INTRAVENOUS | Status: DC
  Filled 2016-03-06: qty 6.67

## 2016-03-06 MED ORDER — POTASSIUM & SODIUM PHOSPHATES 280-160-250 MG PO PACK
4.0000 | PACK | Freq: Two times a day (BID) | ORAL | Status: DC
Start: 2016-03-06 — End: 2016-03-06
  Administered 2016-03-06: 4
  Filled 2016-03-06: qty 4

## 2016-03-06 MED ORDER — MUPIROCIN 2 % EX OINT
1.0000 "application " | TOPICAL_OINTMENT | Freq: Two times a day (BID) | CUTANEOUS | Status: AC
  Administered 2016-03-06 – 2016-03-10 (×10): 1 via NASAL
  Filled 2016-03-06: qty 22

## 2016-03-06 MED ORDER — VANCOMYCIN HCL 500 MG IV SOLR
500.0000 mg | INTRAVENOUS | Status: DC
  Administered 2016-03-06 – 2016-03-08 (×3): 500 mg via INTRAVENOUS
  Filled 2016-03-06 (×4): qty 500

## 2016-03-06 MED ORDER — POTASSIUM PHOSPHATE MONOBASIC 500 MG PO TABS
1000.0000 mg | ORAL_TABLET | Freq: Two times a day (BID) | ORAL | Status: DC
  Filled 2016-03-06: qty 2

## 2016-03-06 MED ORDER — VENLAFAXINE HCL 37.5 MG PO TABS
37.5000 mg | ORAL_TABLET | Freq: Every day | ORAL | Status: DC
  Administered 2016-03-06 – 2016-03-11 (×6): 37.5 mg
  Filled 2016-03-06 (×8): qty 1

## 2016-03-06 MED ORDER — MAGNESIUM SULFATE 2 GM/50ML IV SOLN
2.0000 g | Freq: Once | INTRAVENOUS | Status: AC
  Administered 2016-03-06: 2 g via INTRAVENOUS
  Filled 2016-03-06: qty 50

## 2016-03-06 MED ORDER — CHLORHEXIDINE GLUCONATE CLOTH 2 % EX PADS
6.0000 | MEDICATED_PAD | Freq: Every day | CUTANEOUS | Status: AC
  Administered 2016-03-06 – 2016-03-10 (×5): 6 via TOPICAL

## 2016-03-06 MED ORDER — POTASSIUM PHOSPHATES 15 MMOLE/5ML IV SOLN
10.0000 mmol | Freq: Once | INTRAVENOUS | Status: AC
  Administered 2016-03-06: 10 mmol via INTRAVENOUS
  Filled 2016-03-06: qty 3.33

## 2016-03-06 NOTE — Progress Notes (Signed)
PULMONARY / CRITICAL CARE MEDICINE   Name: Sheila Cisneros MRN: [id number] DOB: 1941/01/12    ADMISSION DATE:  03/05/2016  REFERRING MD:  Dr. Adela Lank   CHIEF COMPLAINT:  AMS, PNA   Brief Patient unresponsive, family not at bedside, information obtained from staff and medical record   75 year old female with PMH of GERD, Dementia, Type 2 DM, COPD, HTN, and Hyperlipidemia presents to the ED on 11/20 from [place]. At baseline patient is non-verbal and has had multiple aspiration events. Yesterday, due to patient clinical status and recent admission for PNA from 10/22-10/26, a CXR was obtained which showed PNA. This morning patient was febrile and more altered then typical at facility, so was sent to ED. Upon arrival patient was alert on 2L [place], during stay in ED patient had an aspiration event where oxygenation was 78%, post-suctioning patient remained minimally responsive and hypotension - requiring intubation. PCCM called to admit.    STUDIES:  11/20 CXR > Bibasilar opacities, increasing on the right since prior study concerning for pneumonia  11/21 CXR > Bibasilar opacities, hardware in right place  CULTURES: Blood 11/20>>NGTD Urine 11/20>> Sputum 11/20 >> MRSA PCR positive  ANTIBIOTICS: Vancomycin 11/20>> Cefepime 11/20 >>  SIGNIFICANT EVENTS: 10/22-10/26: Admission for HCAP  11/20 Admitted for acute hypoxic respiratory failure likely from aspiration  LINES/TUBES: 11/20 3 PIV's 11/20 ETT 11/20 OGT  ROS  SUBJECTIVE:  Sedated. Only grimaces to noxious stimuli.    VITAL SIGNS: Temp:  [97.6 F (36.4 C)-98.4 F (36.9 C)] 98.4 F (36.9 C) (11/21 0900) Pulse Rate:  [74-114] 89 (11/21 1030) Resp:  [12-37] 23 (11/21 1030) BP: (67-144)/(42-97) 106/64 (11/21 1030) SpO2:  [79 %-100 %] 100 % (11/21 1030) FiO2 (%):  [40 %-100 %] 40 % (11/21 0820) Weight:  [51.7 kg (113 lb 15.7 oz)] 51.7 kg (113 lb 15.7 oz) (11/21 0413) HEMODYNAMICS:   VENTILATOR SETTINGS: Vent  Mode: PSV;CPAP FiO2 (%):  [40 %-100 %] 40 % Set Rate:  [18 bmp-20 bmp] 20 bmp Vt Set:  [400 mL] 400 mL PEEP:  [5 cmH20] 5 cmH20 Pressure Support:  [12 cmH20] 12 cmH20 Plateau Pressure:  [16 cmH20-22 cmH20] 18 cmH20 INTAKE / OUTPUT:  Intake/Output Summary (Last 24 hours) at 03/06/16 1103 Last data filed at 03/06/16 1000  Gross per 24 hour  Intake          4641.89 ml  Output                0 ml  Net          4641.89 ml    PHYSICAL EXAMINATION: General: Critically ill,  Elderly female, sedated  Neuro: Sedated, obtunded, does not follow commands HEENT: ETT in place, intermittently breaths faster over vent  Cardiovascular: s1/s2, no MRG, RRR Lungs: air movement bilaterally, diminished at bases  Abdomen: non-tender, active bowel sounds  Musculoskeletal: no deformities  Skin: warm, dry, intact   LABS:  CBC  Recent Labs Lab 03/05/16 1020 03/06/16 0346  WBC 15.9* 15.7*  HGB 11.0* 8.9*  HCT 36.0 29.2*  PLT 275 169   Coag's No results for input(s): APTT, INR in the last 168 hours. BMET  Recent Labs Lab 03/06/16 0346 03/06/16 0702 03/06/16 0919  NA 155* 156* 157*  K 3.5 3.4* 3.3*  CL 130* 127* >130*  CO2 18* 20* 20*  BUN 34* 33* 36*  CREATININE 1.52* 1.54* 1.59*  GLUCOSE 191* 158* 177*   Electrolytes  Recent Labs Lab 03/05/16 1455 03/05/16 1826  03/06/16  2130 03/06/16 0702 03/06/16 0919  CALCIUM 8.6*  --   < > 8.1* 8.4* 8.3*  MG 1.7 1.7  --  1.6*  --   --   PHOS 3.4 2.6  --  1.9*  --   --   < > = values in this interval not displayed. Sepsis Markers  Recent Labs Lab 03/05/16 1441  03/05/16 1824 03/06/16 0346 03/06/16 0702 03/06/16 0919  LATICACIDVEN  --   < > 3.9*  --  2.3* 1.4  PROCALCITON 1.52  --   --  5.54  --   --   < > = values in this interval not displayed. ABG  Recent Labs Lab 03/05/16 1359 03/06/16 0645  PHART 7.316* 7.447  PCO2ART 42.9 28.4*  PO2ART 279.0* 143*   Liver Enzymes  Recent Labs Lab 03/05/16 1133  AST 21  ALT  21  ALKPHOS 100  BILITOT 0.4  ALBUMIN 1.9*   Cardiac Enzymes  Recent Labs Lab 03/05/16 2002 03/06/16 0105 03/06/16 0702  TROPONINI 0.03* 0.05* 0.04*   Glucose  Recent Labs Lab 03/05/16 1153 03/05/16 1532 03/05/16 1950 03/05/16 2346 03/06/16 0355 03/06/16 0753  GLUCAP 178* 213* 207* 239* 165* 131*   DISCUSSION: 75 year old female with recent admission for PNA. At baseline patient has severe dementia and is non-verbal. Recently patient has had multiple events of aspiration. Presents to ED from SNF with diagnosis of PNA. During stay in ED patient had an aspiration event requiring intubation. ASSESSMENT / PLAN:  PULMONARY A: Acute on Chronic Respiratory Failure secondary to Aspiration (Patient has a history of multiple aspiration events) Recent Admission for PNA (10/22-10/26) H/O COPD, bronchiectasis PCT elevated to 5.54 P:   Vent Support SAT/SBT when able. She is deeply sedated now. Antibiotics as listed below  Pulmicort 0.5 mg twice a day  Duonebs q6h  Consider Peg tube given recurrent aspiration  CARDIOVASCULAR A:  Hypotension in setting of sepsis > off pressors now H/O HTN, Hyperlipidemia  P:  Cardiac Monitoring  Off levophed since last night Hold all home HTN medications   RENAL A:   AKI (Baseline Cret 0.7-0.8) - likely 2/2 dehydration  HyperNa: improved but still elevated HypoK/HypoMg/HypoP/Hyper Cl Metabolic alkalosis/lactic acidosis P:   Will d/c saline or 1/2 NS. Will d/c saline as diluent Trend BMP and LA Mg 2 gm Continue free water at 100 cc q6h. Pt will be getting 750 mls of D5 water now with phos replacement. Check bmet at 5pm > if the same, plan to inc free water to 200 mls q6hrs.   GASTROINTESTINAL A:   H/O GERD,  Dysphagia (Multiple aspiration events)  P:   PPI  TF Consider Peg tube given recurrent aspiration SLP when extuabted  HEMATOLOGIC A:   Leukocytosis Anemia Hgb 11>>8.9 likely dilutional P:  Trend CBC    INFECTIOUS A:   Asp PNA, bibasilar. MRSA (+) nares.  Lactic Acidosis, improved Leukocytosis  Sepsis secondary to aspiration PNA P:   Trend WBC and fever curve Trend Lactic Acid and Procal F/u Cultures Continue Cefepime and vanc for now.   ENDOCRINE A:   H/O Type 2 DM P:   SSI  Q4H CBG   NEUROLOGIC A:   Encephalopathy secondary to PNA  H/O Dementia - Baseline non-verbal  P:   Versed and Fentanyl PRN to achieve RASS goal  Resume effexor RASS goal: 0  FAMILY  - Updates: Family updated at bedside.   - Inter-disciplinary family meet or Palliative Care meeting due by:  03/12/16  Candelaria Stagersaye Gonfa, MD.  03/06/16 6:26 AM PGY-2   ATTENDING NOTE / ATTESTATION NOTE :   I have discussed the case with the resident/APP  Candelaria Stagersaye Gonfa MD  I agree with the resident/APP's  history, physical examination, assessment, and plans.    I have edited the above note and modified it according to our agreed history, physical examination, assessment and plan.   I have spent 35  minutes of critical care time with this patient today.  Family :Family updated at length today.  Spoke to siblings.    Pollie MeyerJ. Angelo A de Dios, MD 03/06/2016, 11:10 AM [facility] Pager [phone] After 3 pm or if no answer, call [phone]

## 2016-03-06 NOTE — Progress Notes (Signed)
  Echocardiogram 2D Echocardiogram has been performed.  Sheila Cisneros 03/06/2016, 3:38 PM

## 2016-03-06 NOTE — Progress Notes (Signed)
Pharmacy Antibiotic Note  Sheila Cisneros is a 75 y.o. female admitted on 03/05/2016 with pneumonia.  Pharmacy has been consulted for cefepime and vancomycin dosing. Patient received vancomycin 1 g on 11/20. The patient is afebrile, WBC is elevated at 15.7 and LA is 1.4. Scr is 1.59 and estimated CrCl ~25 mL/min.  Plan: Initiate vancomycin 500 mg IV Q24H Continue Cefepime 1gm IV Q24H Monitor renal function, C&S, clinical status and vancomycin trough at SS  Height: 5\' 2"  (157.5 cm) Weight: 113 lb 15.7 oz (51.7 kg) IBW/kg (Calculated) : 50.1  Temp (24hrs), Avg:98 ?F (36.7 ?C), Min:97.6 ?F (36.4 ?C), Max:98.4 ?F (36.9 ?C)   Recent Labs Lab 03/05/16 1020  03/05/16 1305 03/05/16 1455 03/05/16 1824 03/05/16 2002 03/06/16 0105 03/06/16 0346 03/06/16 0702 03/06/16 0919  WBC 15.9*  --   --   --   --   --   --  15.7*  --   --   CREATININE  --   < >  --  1.32*  1.32*  --  1.57* 1.80* 1.52* 1.54* 1.59*  LATICACIDVEN  --   < > 4.96* 3.5* 3.9*  --   --   --  2.3* 1.4  < > = values in this interval not displayed.  Estimated Creatinine Clearance: 24.6 mL/min (by C-G formula based on SCr of 1.59 mg/dL (H)).    Allergies  Allergen Reactions  . Codeine Rash and Other (See Comments)     angioedema  . Neomycin Itching  . Penicillins Itching and Rash       . Tetanus Toxoids Rash  . Diphtheria Toxoid-Containing Vaccines Other (See Comments)  . Levofloxacin Rash    Antimicrobials this admission: 11/20 Vanc x 1, 11/21>> 11/20 Cefepime >>  Dose adjustments this admission: N/A  Microbiology results: 11/20 MRSA PCR: Pos 11/20 Urine Cx: NGTD 11/20 Blood Cx: NGTD   Lawernce KeasRachel D Kim 03/06/2016 11:09 AM   York CeriseKatherine Cook, PharmD Pharmacy Resident  Pager [phone] 03/06/16 12:11 PM

## 2016-03-06 NOTE — Progress Notes (Signed)
[facility]. CSW following for disposition and return to SNF when medically stable and cleared for discharge.          Lance MussAshley Gardner,MSW, LCSW [facility] ED/39M Clinical Social Worker [phone]

## 2016-03-06 NOTE — Progress Notes (Signed)
eLink Physician-Brief Progress Note Patient Name: Sheila Cisneros DOB: 06-06-1940 MRN: [id number]   Date of Service  03/06/2016  HPI/Events of Note  Hypernatremia treatment > sodium dropped from 161 to 15 during afternoon 11/20  eICU Interventions  Hold D5 with KCL F/u repeat BMET for K replacement     Intervention Category Major Interventions: Electrolyte abnormality - evaluation and management  [name] 03/06/2016, 12:52 AM

## 2016-03-07 ENCOUNTER — Inpatient Hospital Stay (HOSPITAL_COMMUNITY): Payer: Medicare Other

## 2016-03-07 LAB — GLUCOSE, CAPILLARY
GLUCOSE-CAPILLARY: 128 mg/dL — AB (ref 65–99)
GLUCOSE-CAPILLARY: 142 mg/dL — AB (ref 65–99)
GLUCOSE-CAPILLARY: 160 mg/dL — AB (ref 65–99)
Glucose-Capillary: 130 mg/dL — ABNORMAL HIGH (ref 65–99)
Glucose-Capillary: 133 mg/dL — ABNORMAL HIGH (ref 65–99)
Glucose-Capillary: 131 mg/dL — ABNORMAL HIGH (ref 65–99)

## 2016-03-07 LAB — BASIC METABOLIC PANEL
ANION GAP: 6 (ref 5–15)
Anion gap: 6 (ref 5–15)
BUN: 31 mg/dL — ABNORMAL HIGH (ref 6–20)
BUN: 27 mg/dL — ABNORMAL HIGH (ref 6–20)
CALCIUM: 8.6 mg/dL — AB (ref 8.9–10.3)
CHLORIDE: 126 mmol/L — AB (ref 101–111)
CHLORIDE: 126 mmol/L — AB (ref 101–111)
CO2: 23 mmol/L (ref 22–32)
CO2: 21 mmol/L — AB (ref 22–32)
CREATININE: 1.31 mg/dL — AB (ref 0.44–1.00)
CREATININE: 1.26 mg/dL — AB (ref 0.44–1.00)
Calcium: 8.4 mg/dL — ABNORMAL LOW (ref 8.9–10.3)
GFR calc non Af Amer: 39 mL/min — ABNORMAL LOW (ref >60)
GFR calc non Af Amer: 41 mL/min — ABNORMAL LOW (ref >60)
GFR, EST AFRICAN AMERICAN: 45 mL/min — AB (ref >60)
GFR, EST AFRICAN AMERICAN: 47 mL/min — AB (ref >60)
GLUCOSE: 186 mg/dL — AB (ref 65–99)
GLUCOSE: 157 mg/dL — AB (ref 65–99)
Potassium: 3.3 mmol/L — ABNORMAL LOW (ref 3.5–5.1)
Potassium: 4.6 mmol/L (ref 3.5–5.1)
Sodium: 155 mmol/L — ABNORMAL HIGH (ref 135–145)
Sodium: 153 mmol/L — ABNORMAL HIGH (ref 135–145)

## 2016-03-07 LAB — CBC
HEMATOCRIT: 29.2 % — AB (ref 36.0–46.0)
Hemoglobin: 8.7 g/dL — ABNORMAL LOW (ref 12.0–15.0)
MCH: 26.4 pg (ref 26.0–34.0)
MCHC: 29.8 g/dL — ABNORMAL LOW (ref 30.0–36.0)
MCV: 88.8 fL (ref 78.0–100.0)
Platelets: 203 10*3/uL (ref 150–400)
RBC: 3.29 MIL/uL — AB (ref 3.87–5.11)
RDW: 15.5 % (ref 11.5–15.5)
WBC: 16.7 10*3/uL — AB (ref 4.0–10.5)

## 2016-03-07 LAB — PHOSPHORUS: PHOSPHORUS: 3.1 mg/dL (ref 2.5–4.6)

## 2016-03-07 LAB — BLOOD GAS, ARTERIAL
Acid-base deficit: 2.1 mmol/L — ABNORMAL HIGH (ref 0.0–2.0)
Bicarbonate: 21.4 mmol/L (ref 20.0–28.0)
DRAWN BY: 437071
FIO2: 40
MECHVT: 400 mL
O2 Saturation: 98.9 %
PEEP: 5 cmH2O
PO2 ART: 137 mmHg — AB (ref 83.0–108.0)
Patient temperature: 98.6
RATE: 20 resp/min
pCO2 arterial: 31.9 mmHg — ABNORMAL LOW (ref 32.0–48.0)
pH, Arterial: 7.443 (ref 7.350–7.450)

## 2016-03-07 LAB — PROCALCITONIN: Procalcitonin: 4.75 ng/mL

## 2016-03-07 LAB — MAGNESIUM: Magnesium: 2.2 mg/dL (ref 1.7–2.4)

## 2016-03-07 MED ORDER — FREE WATER: 200.0000 mL | Freq: Four times a day (QID) | Status: DC

## 2016-03-07 MED ORDER — FREE WATER
200.0000 mL | Status: DC
Start: 2016-03-07 — End: 2016-03-07

## 2016-03-07 MED ORDER — FREE WATER
200.0000 mL | Status: DC
  Administered 2016-03-07 – 2016-03-09 (×12): 200 mL

## 2016-03-07 MED ORDER — POTASSIUM CHLORIDE 20 MEQ PO PACK
40.0000 meq | PACK | Freq: Every day | ORAL | Status: AC
  Administered 2016-03-07 (×2): 40 meq
  Filled 2016-03-07 (×2): qty 2

## 2016-03-07 MED ORDER — POTASSIUM CHLORIDE CRYS ER 20 MEQ PO TBCR: 40.0000 meq | EXTENDED_RELEASE_TABLET | Freq: Every day | ORAL | Status: DC

## 2016-03-07 NOTE — Progress Notes (Signed)
Inpatient Diabetes Program Recommendations  AACE/ADA: New Consensus Statement on Inpatient Glycemic Control (2015)  Target Ranges:  Prepandial:   less than 140 mg/dL      Peak postprandial:   less than 180 mg/dL (1-2 hours)      Critically ill patients:  140 - 180 mg/dL   Results for Sheila Cisneros, Cecilia L (MRN [id number]) as of 03/07/2016 08:12  Ref. Range 03/06/2016 11:35 03/06/2016 16:06 03/06/2016 20:11 03/07/2016 00:10 03/07/2016 04:23 03/07/2016 08:08  Glucose-Capillary Latest Ref Range: 65 - 99 mg/dL 119167 (H) 147206 (H) 829133 (H) 142 (H) 160 (H) 130 (H)   Lab Results  Component Value Date   GLUCAP 130 (H) 03/07/2016   HGBA1C 6.4 (H) 02/06/2016   Highest A1C = 7.1% noted to be in 2014, well controlled DM Review of Glycemic Control  Diabetes history: DM2 Outpatient Diabetes medications: None Current orders for Inpatient glycemic control:       Novolog 2-6 units Q4H per Phase 1 of Adult ICU Glycemic Control Order Set  Inpatient Diabetes Program Recommendations:      Agree with current plan of DM care.  Thank you,  Kristine LineaKaren Kevis Qu, RN, BSN Diabetes Coordinator Inpatient Diabetes Program [phone] (Team Pager)

## 2016-03-07 NOTE — Progress Notes (Signed)
PULMONARY / CRITICAL CARE MEDICINE   Name: Sheila Cisneros MRN: [id number] DOB: 1940/12/15    ADMISSION DATE:  03/05/2016  REFERRING MD:  Dr. Adela LankFloyd   CHIEF COMPLAINT:  AMS, PNA   Brief Patient unresponsive, family not at bedside, information obtained from staff and medical record   75 year old female with PMH of GERD, Dementia, Type 2 DM, COPD, HTN, and Hyperlipidemia presents to the ED on 11/20 from [facility]. At baseline patient is non-verbal and has had multiple aspiration events. Yesterday, due to patient clinical status and recent admission for PNA from 10/22-10/26, a CXR was obtained which showed PNA. This morning patient was febrile and more altered then typical at facility, so was sent to ED. Upon arrival patient was alert on 2L [place], during stay in ED patient had an aspiration event where oxygenation was 78%, post-suctioning patient remained minimally responsive and hypotension - requiring intubation. PCCM called to admit.    STUDIES:  11/20 CXR > Bibasilar opacities, increasing on the right since prior study concerning for pneumonia  11/21 CXR > Bibasilar opacities, hardware in right place  11/22 CXR with improved bibasilar opacities, hardware in right place  CULTURES: Blood 11/20>>NGTD Urine 11/20>>NGTD Sputum 11/20 >> MRSA PCR positive  ANTIBIOTICS: Vancomycin 11/20>> Cefepime 11/20 >>  SIGNIFICANT EVENTS: 10/22-10/26: Admission for HCAP  11/20 Admitted for acute hypoxic respiratory failure likely from aspiration  LINES/TUBES: 11/20 3 PIV's 11/20 ETT 11/20 OGT  SUBJECTIVE:  Drowsy but opens eyes in response sound. Intermittently follows command. Intubated and on PS 10/5.     VITAL SIGNS: Temp:  [98.2 F (36.8 C)-99.9 F (37.7 C)] 98.6 F (37 C) (11/22 0800) Pulse Rate:  [80-99] 96 (11/22 0800) Resp:  [15-27] 15 (11/22 0800) BP: (94-139)/(53-87) 139/87 (11/22 0800) SpO2:  [100 %] 100 % (11/22 0800) FiO2 (%):  [40 %] 40 % (11/22 0740) Weight:   [51.8 kg (114 lb 3.2 oz)] 51.8 kg (114 lb 3.2 oz) (11/22 0349) HEMODYNAMICS:   VENTILATOR SETTINGS: Vent Mode: PRVC FiO2 (%):  [40 %] 40 % Set Rate:  [20 bmp] 20 bmp Vt Set:  [400 mL] 400 mL PEEP:  [5 cmH20] 5 cmH20 Plateau Pressure:  [15 cmH20-17 cmH20] 16 cmH20 INTAKE / OUTPUT:  Intake/Output Summary (Last 24 hours) at 03/07/16 0827 Last data filed at 03/07/16 0700  Gross per 24 hour  Intake          2018.33 ml  Output              675 ml  Net          1343.33 ml    PHYSICAL EXAMINATION: General: Critically ill,  Elderly female, intubated Neuro: drowsy, intermittently and briefly follows commands, calm on PS 10/5,  HEENT: ETT in place,  Cardiovascular: s1/s2, no MRG, RRR Lungs: air movement bilaterally, diminished at bases  Abdomen: non-tender, active bowel sounds  Musculoskeletal: no deformities  Skin: warm, dry, intact   LABS:  CBC  Recent Labs Lab 03/05/16 03/05/16 1020 03/06/16 0346  WBC 14.8 15.9* 15.7*  HGB 11.6* 11.0* 8.9*  HCT 37 36.0 29.2*  PLT 227 275 169   Coag's No results for input(s): APTT, INR in the last 168 hours. BMET  Recent Labs Lab 03/06/16 0919 03/06/16 1658 03/07/16 0522  NA 157* 152* 155*  K 3.3* 3.3* 3.3*  CL >130* 125* 126*  CO2 20* 20* 23  BUN 36* 34* 31*  CREATININE 1.59* 1.50* 1.31*  GLUCOSE 177* 220* 186*   Electrolytes  Recent Labs Lab 03/06/16 0346  03/06/16 0919 03/06/16 1658 03/07/16 0522  CALCIUM 8.1*  < > 8.3* 7.8* 8.4*  MG 1.6*  --   --  2.3 2.2  PHOS 1.9*  --  1.8* 3.0 3.1  < > = values in this interval not displayed. Sepsis Markers  Recent Labs Lab 03/05/16 1441  03/05/16 1824 03/06/16 0346 03/06/16 0702 03/06/16 0919 03/07/16 0522  LATICACIDVEN  --   < > 3.9*  --  2.3* 1.4  --   PROCALCITON 1.52  --   --  5.54  --   --  4.75  < > = values in this interval not displayed. ABG  Recent Labs Lab 03/05/16 1359 03/06/16 0645 03/07/16 0704  PHART 7.316* 7.447 7.443  PCO2ART 42.9 28.4* 31.9*   PO2ART 279.0* 143* 137*   Liver Enzymes  Recent Labs Lab 03/05/16 1133  AST 21  ALT 21  ALKPHOS 100  BILITOT 0.4  ALBUMIN 1.9*   Cardiac Enzymes  Recent Labs Lab 03/05/16 2002 03/06/16 0105 03/06/16 0702  TROPONINI 0.03* 0.05* 0.04*   Glucose  Recent Labs Lab 03/06/16 1135 03/06/16 1606 03/06/16 2011 03/07/16 0010 03/07/16 0423 03/07/16 0808  GLUCAP 167* 206* 133* 142* 160* 130*   DISCUSSION: 75 year old female with recent admission for PNA. At baseline patient has severe dementia and is non-verbal. Recently patient has had multiple events of aspiration. Presents to ED from SNF with diagnosis of PNA. During stay in ED patient had an aspiration event requiring intubation. ASSESSMENT / PLAN:  PULMONARY A: Acute on Chronic Respiratory Failure secondary to Aspiration (Patient has a history of multiple aspiration events) Recent Admission for PNA (10/22-10/26) H/O COPD, bronchiectasis PCT elevated to 5.54>>4.75 Bibasilar opacities improved. P:   Vent Support SAT/SBT when able. Still drowsy today (related to electrolytes issue as well).  Antibiotics as listed below  Pulmicort 0.5 mg twice a day  Duonebs q6h  Consider Peg tube given recurrent aspiration > will talk to nephew  CARDIOVASCULAR A:  Hypotension in setting of sepsis > off pressors now H/O HTN, Hyperlipidemia  Echo 11/21 EF 55-60% G1DD P:  Cardiac Monitoring  HDS off levophed since 11/20 Hold all home HTN medications   RENAL A:   AKI (Baseline Cret 0.7-0.8) - likely 2/2 dehydration: improving Lactic acidosis resolved HyperNa: improved but still elevated Hyper Cl P:   Avoid NS or chloride pruducts Trend BMP Increase free water at 200 cc q4 hrs > rpt bmet at 7 pm.   GASTROINTESTINAL A:   H/O GERD,  Dysphagia (Multiple aspiration events)  Had BM 11/22 P:   PPI  TF Consider Peg tube given recurrent aspiration SLP when extuabted  HEMATOLOGIC A:   Leukocytosis Anemia Hgb  11>>8.9 likely dilutional P:  Trend CBC   INFECTIOUS A:   Asp PNA, bibasilar-improved. MRSA(+) nares.  Lactic Acidosis resolved Leukocytosis  Sepsis secondary to aspiration PNA P:   Trend WBC and fever curve Trend Procal F/u Cultures Continue Cefepime and vanc for now. No sputum seen. Plan to swtich to unasyn if sputum is U/R.   ENDOCRINE A:   H/O Type 2 DM P:   SSI  Q4H CBG   NEUROLOGIC A:   Encephalopathy secondary to PNA  H/O Dementia - Baseline non-verbal  P:   Versed and Fentanyl PRN to achieve RASS goal  Home effexor RASS goal: 0  FAMILY  - Updates: Family updated at bedside on 11/21.   - Inter-disciplinary family meet or Palliative Care meeting  due by:  03/12/16  Candelaria Stagersaye Gonfa, MD.  03/06/16 6:26 AM PGY-2    ATTENDING NOTE / ATTESTATION NOTE :   I have discussed the case with the resident/APP  Candelaria Stagersaye Gonfa MD  I agree with the resident/APP's  history, physical examination, assessment, and plans.    I have edited the above note and modified it according to our agreed history, physical examination, assessment and plan.   I have spent 30  minutes of critical care time with this patient today.  Family :Family updated at length today.    Pollie MeyerJ. Angelo A de Dios, MD 03/07/2016, 9:15 AM [place] [facility] Pager [phone] After 3 pm or if no answer, call [phone]

## 2016-03-08 ENCOUNTER — Inpatient Hospital Stay (HOSPITAL_COMMUNITY): Payer: Medicare Other

## 2016-03-08 LAB — BASIC METABOLIC PANEL
ANION GAP: 6 (ref 5–15)
ANION GAP: 7 (ref 5–15)
BUN: 27 mg/dL — ABNORMAL HIGH (ref 6–20)
BUN: 24 mg/dL — ABNORMAL HIGH (ref 6–20)
CALCIUM: 8.6 mg/dL — AB (ref 8.9–10.3)
CHLORIDE: 115 mmol/L — AB (ref 101–111)
CO2: 21 mmol/L — AB (ref 22–32)
CO2: 23 mmol/L (ref 22–32)
Calcium: 8.7 mg/dL — ABNORMAL LOW (ref 8.9–10.3)
Chloride: 124 mmol/L — ABNORMAL HIGH (ref 101–111)
Creatinine, Ser: 1.18 mg/dL — ABNORMAL HIGH (ref 0.44–1.00)
Creatinine, Ser: 1.11 mg/dL — ABNORMAL HIGH (ref 0.44–1.00)
GFR calc non Af Amer: 44 mL/min — ABNORMAL LOW (ref >60)
GFR, EST AFRICAN AMERICAN: 51 mL/min — AB (ref >60)
GFR, EST AFRICAN AMERICAN: 55 mL/min — AB (ref >60)
GFR, EST NON AFRICAN AMERICAN: 48 mL/min — AB (ref >60)
Glucose, Bld: 131 mg/dL — ABNORMAL HIGH (ref 65–99)
Glucose, Bld: 95 mg/dL (ref 65–99)
POTASSIUM: 4.3 mmol/L (ref 3.5–5.1)
POTASSIUM: 3.6 mmol/L (ref 3.5–5.1)
SODIUM: 145 mmol/L (ref 135–145)
Sodium: 151 mmol/L — ABNORMAL HIGH (ref 135–145)

## 2016-03-08 LAB — CBC
HEMATOCRIT: 25.8 % — AB (ref 36.0–46.0)
Hemoglobin: 8 g/dL — ABNORMAL LOW (ref 12.0–15.0)
MCH: 27.4 pg (ref 26.0–34.0)
MCHC: 31 g/dL (ref 30.0–36.0)
MCV: 88.4 fL (ref 78.0–100.0)
PLATELETS: 215 10*3/uL (ref 150–400)
RBC: 2.92 MIL/uL — AB (ref 3.87–5.11)
RDW: 15.6 % — AB (ref 11.5–15.5)
WBC: 14.5 10*3/uL — AB (ref 4.0–10.5)

## 2016-03-08 LAB — GLUCOSE, CAPILLARY
GLUCOSE-CAPILLARY: 125 mg/dL — AB (ref 65–99)
GLUCOSE-CAPILLARY: 147 mg/dL — AB (ref 65–99)
GLUCOSE-CAPILLARY: 76 mg/dL (ref 65–99)
Glucose-Capillary: 112 mg/dL — ABNORMAL HIGH (ref 65–99)
Glucose-Capillary: 136 mg/dL — ABNORMAL HIGH (ref 65–99)

## 2016-03-08 LAB — MAGNESIUM: Magnesium: 1.9 mg/dL (ref 1.7–2.4)

## 2016-03-08 LAB — PHOSPHORUS: PHOSPHORUS: 2.4 mg/dL — AB (ref 2.5–4.6)

## 2016-03-08 MED ORDER — FUROSEMIDE 10 MG/ML IJ SOLN
20.0000 mg | Freq: Two times a day (BID) | INTRAMUSCULAR | Status: DC
  Administered 2016-03-08 – 2016-03-09 (×2): 20 mg via INTRAVENOUS
  Filled 2016-03-08 (×3): qty 2

## 2016-03-08 NOTE — Progress Notes (Signed)
PULMONARY / CRITICAL CARE MEDICINE   Name: Sheila Cisneros MRN: [id number] DOB: 1941/03/01    ADMISSION DATE:  03/05/2016  REFERRING MD:  Dr. Adela LankFloyd   CHIEF COMPLAINT:  AMS, PNA   Brief Patient unresponsive, family not at bedside, information obtained from staff and medical record   75 year old female with PMH of GERD, Dementia, Type 2 DM, COPD, HTN, and Hyperlipidemia presents to the ED on 11/20 from [facility]. At baseline patient is non-verbal and has had multiple aspiration events. Yesterday, due to patient clinical status and recent admission for PNA from 10/22-10/26, a CXR was obtained which showed PNA. This morning patient was febrile and more altered then typical at facility, so was sent to ED. Upon arrival patient was alert on 2L [place], during stay in ED patient had an aspiration event where oxygenation was 78%, post-suctioning patient remained minimally responsive and hypotension - requiring intubation. PCCM called to admit.    STUDIES:  11/20 CXR > Bibasilar opacities, increasing on the right since prior study concerning for pneumonia  11/21 CXR > Bibasilar opacities, hardware in right place  11/22 CXR with improved bibasilar opacities, hardware in right place  CULTURES: Blood 11/20>>NGTD Urine 11/20>>NGTD Sputum 11/20 >> MRSA PCR positive  ANTIBIOTICS: Vancomycin 11/20>> Cefepime 11/20 >>  SIGNIFICANT EVENTS: 10/22-10/26: Admission for HCAP  11/20 Admitted for acute hypoxic respiratory failure likely from aspiration  LINES/TUBES: 11/20 3 PIV's 11/20 ETT 11/20 OGT  SUBJECTIVE:  Drowsy but opens eyes in response sound. Intermittently follows command. Intubated. Tachypneic on the vent (full support)    VITAL SIGNS: Temp:  [97.5 F (36.4 C)-99.2 F (37.3 C)] 97.5 F (36.4 C) (11/23 0752) Pulse Rate:  [68-108] 81 (11/23 0654) Resp:  [15-31] 20 (11/23 0654) BP: (103-136)/(53-84) 136/84 (11/23 0654) SpO2:  [99 %-100 %] 100 % (11/23 0654) FiO2 (%):  [40  %] 40 % (11/23 0654) Weight:  [53.1 kg (117 lb 1 oz)] 53.1 kg (117 lb 1 oz) (11/23 0443) HEMODYNAMICS:   VENTILATOR SETTINGS: Vent Mode: PRVC FiO2 (%):  [40 %] 40 % Set Rate:  [20 bmp] 20 bmp Vt Set:  [400 mL] 400 mL PEEP:  [5 cmH20-10 cmH20] 5 cmH20 Pressure Support:  [10 cmH20] 10 cmH20 Plateau Pressure:  [15 cmH20-18 cmH20] 16 cmH20 INTAKE / OUTPUT:  Intake/Output Summary (Last 24 hours) at 03/08/16 1059 Last data filed at 03/08/16 0500  Gross per 24 hour  Intake             1750 ml  Output             1075 ml  Net              675 ml    PHYSICAL EXAMINATION: General: Critically ill,  Elderly female, intubated Neuro: drowsy, intermittently and briefly follows commands, tachypneic on full support.  HEENT: ETT in place,  Cardiovascular: s1/s2, no MRG, RRR Lungs: air movement bilaterally, diminished at bases  Abdomen: non-tender, active bowel sounds  Musculoskeletal: no deformities  Skin: warm, dry, intact. Gr 2 edema  LABS:  CBC  Recent Labs Lab 03/06/16 0346 03/07/16 0829 03/08/16 0252  WBC 15.7* 16.7* 14.5*  HGB 8.9* 8.7* 8.0*  HCT 29.2* 29.2* 25.8*  PLT 169 203 215   Coag's No results for input(s): APTT, INR in the last 168 hours. BMET  Recent Labs Lab 03/07/16 0522 03/07/16 1822 03/08/16 0252  NA 155* 153* 151*  K 3.3* 4.6 4.3  CL 126* 126* 124*  CO2 23 21*  21*  BUN 31* 27* 27*  CREATININE 1.31* 1.26* 1.18*  GLUCOSE 186* 157* 131*   Electrolytes  Recent Labs Lab 03/06/16 1658 03/07/16 0522 03/07/16 1822 03/08/16 0252  CALCIUM 7.8* 8.4* 8.6* 8.6*  MG 2.3 2.2  --  1.9  PHOS 3.0 3.1  --  2.4*   Sepsis Markers  Recent Labs Lab 03/05/16 1441  03/05/16 1824 03/06/16 0346 03/06/16 0702 03/06/16 0919 03/07/16 0522  LATICACIDVEN  --   < > 3.9*  --  2.3* 1.4  --   PROCALCITON 1.52  --   --  5.54  --   --  4.75  < > = values in this interval not displayed. ABG  Recent Labs Lab 03/05/16 1359 03/06/16 0645 03/07/16 0704  PHART  7.316* 7.447 7.443  PCO2ART 42.9 28.4* 31.9*  PO2ART 279.0* 143* 137*   Liver Enzymes  Recent Labs Lab 03/05/16 1133  AST 21  ALT 21  ALKPHOS 100  BILITOT 0.4  ALBUMIN 1.9*   Cardiac Enzymes  Recent Labs Lab 03/05/16 2002 03/06/16 0105 03/06/16 0702  TROPONINI 0.03* 0.05* 0.04*   Glucose  Recent Labs Lab 03/07/16 1125 03/07/16 1624 03/07/16 1959 03/07/16 2335 03/08/16 0332 03/08/16 0749  GLUCAP 133* 131* 128* 125* 112* 136*   DISCUSSION: 75 year old female with recent admission for PNA. At baseline patient has severe dementia and is non-verbal. Recently patient has had multiple events of aspiration. Presents to ED from SNF with diagnosis of PNA. During stay in ED patient had an aspiration event requiring intubation. ASSESSMENT / PLAN:  PULMONARY A: Acute on Chronic Respiratory Failure secondary to Aspiration (Patient has a history of multiple aspiration events), now with volume overload.  Recent Admission for PNA (10/22-10/26) H/O COPD, bronchiectasis PCT elevated to 5.54>>4.75 Bibasilar opacities improved. P:   Vent Support SAT/SBT when able. Still drowsy today (related to electrolytes issue as well). Tachypneic 2/2 volume issues.  Antibiotics as listed below  Pulmicort 0.5 mg twice a day  Duonebs q6h  Consider Peg tube given recurrent aspiration > will talk to nephew diuresce gently (cant diuresce much 2/2 hyperna, hypercl)  CARDIOVASCULAR A:  Hypotension in setting of sepsis > off pressors now Pulm edema H/O HTN, Hyperlipidemia  Echo 11/21 EF 55-60% G1DD P:  Diuresce. Cardiac Monitoring  HDS off levophed since 11/20 Hold all home HTN medications   RENAL A:   AKI (Baseline Cret 0.7-0.8) - likely 2/2 dehydration: improving Lactic acidosis resolved HyperNa: improved but still elevated Hyper Cl P:   Avoid NS or chloride pruducts Trend BMP Cont free water 200 ml q4. Check bmet this pm. diuresce gently.   GASTROINTESTINAL A:   H/O  GERD,  Dysphagia (Multiple aspiration events)  Had BM 11/22 P:   PPI  TF Consider Peg tube given recurrent aspiration SLP when extuabted  HEMATOLOGIC A:   Leukocytosis Anemia Hgb 11>>8.9 likely dilutional P:  Trend CBC   INFECTIOUS A:   Asp PNA, bibasilar-improved. MRSA(+) nares.  Lactic Acidosis resolved Leukocytosis  Sepsis secondary to aspiration PNA P:   Trend WBC and fever curve Trend Procal F/u Cultures Continue Cefepime and vanc for now. No sputum seen. Plan to swtich to unasyn if sputum is U/R.   ENDOCRINE A:   H/O Type 2 DM P:   SSI  Q4H CBG   NEUROLOGIC A:   Encephalopathy secondary to PNA  H/O Dementia - Baseline non-verbal  P:   Versed and Fentanyl PRN to achieve RASS goal  Home effexor RASS goal:  0  FAMILY  - Updates: Family updated at bedside on 11/21.   - Inter-disciplinary family meet or Palliative Care meeting due by:  03/12/16   I have spent 30  minutes of critical care time with this patient today.  Pollie Meyer, MD 03/08/2016, 10:59 AM [place] [facility] Pager [phone] After 3 pm or if no answer, call [phone]

## 2016-03-09 ENCOUNTER — Inpatient Hospital Stay (HOSPITAL_COMMUNITY): Payer: Medicare Other

## 2016-03-09 LAB — BASIC METABOLIC PANEL
ANION GAP: 9 (ref 5–15)
BUN: 26 mg/dL — ABNORMAL HIGH (ref 6–20)
CO2: 25 mmol/L (ref 22–32)
Calcium: 9 mg/dL (ref 8.9–10.3)
Chloride: 111 mmol/L (ref 101–111)
Creatinine, Ser: 1.09 mg/dL — ABNORMAL HIGH (ref 0.44–1.00)
GFR, EST AFRICAN AMERICAN: 57 mL/min — AB (ref >60)
GFR, EST NON AFRICAN AMERICAN: 49 mL/min — AB (ref >60)
Glucose, Bld: 122 mg/dL — ABNORMAL HIGH (ref 65–99)
POTASSIUM: 3.3 mmol/L — AB (ref 3.5–5.1)
SODIUM: 145 mmol/L (ref 135–145)

## 2016-03-09 LAB — GLUCOSE, CAPILLARY
GLUCOSE-CAPILLARY: 120 mg/dL — AB (ref 65–99)
GLUCOSE-CAPILLARY: 147 mg/dL — AB (ref 65–99)
GLUCOSE-CAPILLARY: 159 mg/dL — AB (ref 65–99)
Glucose-Capillary: 100 mg/dL — ABNORMAL HIGH (ref 65–99)
Glucose-Capillary: 123 mg/dL — ABNORMAL HIGH (ref 65–99)
Glucose-Capillary: 140 mg/dL — ABNORMAL HIGH (ref 65–99)

## 2016-03-09 LAB — PHOSPHORUS: Phosphorus: 3.2 mg/dL (ref 2.5–4.6)

## 2016-03-09 LAB — CBC
HCT: 28.5 % — ABNORMAL LOW (ref 36.0–46.0)
Hemoglobin: 8.9 g/dL — ABNORMAL LOW (ref 12.0–15.0)
MCH: 27.3 pg (ref 26.0–34.0)
MCHC: 31.2 g/dL (ref 30.0–36.0)
MCV: 87.4 fL (ref 78.0–100.0)
PLATELETS: 258 10*3/uL (ref 150–400)
RBC: 3.26 MIL/uL — AB (ref 3.87–5.11)
RDW: 15.6 % — ABNORMAL HIGH (ref 11.5–15.5)
WBC: 14 10*3/uL — AB (ref 4.0–10.5)

## 2016-03-09 LAB — MAGNESIUM: MAGNESIUM: 1.6 mg/dL — AB (ref 1.7–2.4)

## 2016-03-09 MED ORDER — SODIUM CHLORIDE 0.9 % IV SOLN
3.0000 g | Freq: Three times a day (TID) | INTRAVENOUS | Status: DC
  Administered 2016-03-09 – 2016-03-10 (×3): 3 g via INTRAVENOUS
  Filled 2016-03-09 (×5): qty 3

## 2016-03-09 MED ORDER — POTASSIUM CHLORIDE 20 MEQ/15ML (10%) PO SOLN
40.0000 meq | Freq: Every day | ORAL | Status: AC
  Administered 2016-03-09 (×3): 40 meq via ORAL
  Filled 2016-03-09 (×3): qty 30

## 2016-03-09 MED ORDER — FREE WATER
100.0000 mL | Freq: Four times a day (QID) | Status: DC
  Administered 2016-03-09 – 2016-03-10 (×5): 100 mL

## 2016-03-09 MED ORDER — FUROSEMIDE 10 MG/ML IJ SOLN
20.0000 mg | Freq: Every day | INTRAMUSCULAR | Status: DC
  Administered 2016-03-10 – 2016-03-11 (×2): 20 mg via INTRAVENOUS
  Filled 2016-03-09 (×3): qty 2

## 2016-03-09 MED ORDER — MAGNESIUM SULFATE 2 GM/50ML IV SOLN
2.0000 g | Freq: Once | INTRAVENOUS | Status: AC
  Administered 2016-03-09: 2 g via INTRAVENOUS
  Filled 2016-03-09: qty 50

## 2016-03-09 NOTE — Progress Notes (Signed)
PULMONARY / CRITICAL CARE MEDICINE   Name: Sheila Cisneros MRN: [id number] DOB: 12-02-1940    ADMISSION DATE:  03/05/2016  REFERRING MD:  Dr. Adela LankFloyd   CHIEF COMPLAINT:  AMS, PNA   Brief Patient unresponsive, family not at bedside, information obtained from staff and medical record   75 year old female with PMH of GERD, Dementia, Type 2 DM, COPD, HTN, and Hyperlipidemia presents to the ED on 11/20 from [facility]. At baseline patient is non-verbal and has had multiple aspiration events. Yesterday, due to patient clinical status and recent admission for PNA from 10/22-10/26, a CXR was obtained which showed PNA. This morning patient was febrile and more altered then typical at facility, so was sent to ED. Upon arrival patient was alert on 2L [place], during stay in ED patient had an aspiration event where oxygenation was 78%, post-suctioning patient remained minimally responsive and hypotension - requiring intubation. PCCM called to admit.    STUDIES:  11/20 CXR > Bibasilar opacities, increasing on the right since prior study concerning for pneumonia  11/21 CXR > Bibasilar opacities, hardware in right place  11/22 CXR with improved bibasilar opacities, hardware in right place  11/24 CXR  Improved LLL and RML infiltrates compared to prior. Hardware in the right place.   CULTURES: Blood 11/20>>NGTD Urine 11/20>>NGTD Sputum 11/22>> MRSA PCR positive  ANTIBIOTICS: Vancomycin 11/20>> Cefepime 11/20 >>  SIGNIFICANT EVENTS: 10/22-10/26: Admission for HCAP  11/20 Admitted for acute hypoxic respiratory failure likely from aspiration 11/23 Drowsy and Tachypneic for SBT  LINES/TUBES: 11/20 3 PIV's 11/20 ETT 11/20 OGT  SUBJECTIVE:  Intubated and awake. Became apneac when weaned to PS this morning. Appears calm on PRVC. Denies pain.  Doing PST now > looks fair/comfortable.   VITAL SIGNS: Temp:  [97.5 F (36.4 C)-99.5 F (37.5 C)] 98.7 F (37.1 C) (11/24 0400) Pulse Rate:   [79-107] 100 (11/24 0600) Resp:  [20-27] 27 (11/24 0600) BP: (111-130)/(62-85) 130/85 (11/24 0600) SpO2:  [99 %-100 %] 100 % (11/24 0600) FiO2 (%):  [40 %] 40 % (11/24 0400) Weight:  [110 lb 3.7 oz (50 kg)] 110 lb 3.7 oz (50 kg) (11/24 0500) HEMODYNAMICS:   VENTILATOR SETTINGS: Vent Mode: PRVC FiO2 (%):  [40 %] 40 % Set Rate:  [20 bmp] 20 bmp Vt Set:  [400 mL] 400 mL PEEP:  [5 cmH20] 5 cmH20 Plateau Pressure:  [14 cmH20-17 cmH20] 16 cmH20 INTAKE / OUTPUT:  Intake/Output Summary (Last 24 hours) at 03/09/16 0700 Last data filed at 03/09/16 0600  Gross per 24 hour  Intake             2210 ml  Output             3960 ml  Net            -1750 ml    PHYSICAL EXAMINATION: General: awake, appears calm, intubated. On PST > on and off drowsiness.  Neuro: slightly drowsy, intermittently follows commands, on full vent HEENT: ETT in place Cardiovascular: s1/s2, no MRG, RRR Lungs: air movement bilaterally, diminished at bases, no wheeze or crackles Abdomen: non-tender, active bowel sounds  Musculoskeletal: no deformities, trace edema bilaterally Skin: warm, dry, intact  LABS:  CBC  Recent Labs Lab 03/07/16 0829 03/08/16 0252 03/09/16 0227  WBC 16.7* 14.5* 14.0*  HGB 8.7* 8.0* 8.9*  HCT 29.2* 25.8* 28.5*  PLT 203 215 258   Coag's No results for input(s): APTT, INR in the last 168 hours. BMET  Recent Labs Lab 03/08/16 [phone]  03/08/16 1741 03/09/16 0227  NA 151* 145 145  K 4.3 3.6 3.3*  CL 124* 115* 111  CO2 21* 23 25  BUN 27* 24* 26*  CREATININE 1.18* 1.11* 1.09*  GLUCOSE 131* 95 122*   Electrolytes  Recent Labs Lab 03/07/16 0522  03/08/16 0252 03/08/16 1741 03/09/16 0227  CALCIUM 8.4*  < > 8.6* 8.7* 9.0  MG 2.2  --  1.9  --  1.6*  PHOS 3.1  --  2.4*  --  3.2  < > = values in this interval not displayed. Sepsis Markers  Recent Labs Lab 03/05/16 1441  03/05/16 1824 03/06/16 0346 03/06/16 0702 03/06/16 0919 03/07/16 0522  LATICACIDVEN  --   < >  3.9*  --  2.3* 1.4  --   PROCALCITON 1.52  --   --  5.54  --   --  4.75  < > = values in this interval not displayed. ABG  Recent Labs Lab 03/05/16 1359 03/06/16 0645 03/07/16 0704  PHART 7.316* 7.447 7.443  PCO2ART 42.9 28.4* 31.9*  PO2ART 279.0* 143* 137*   Liver Enzymes  Recent Labs Lab 03/05/16 1133  AST 21  ALT 21  ALKPHOS 100  BILITOT 0.4  ALBUMIN 1.9*   Cardiac Enzymes  Recent Labs Lab 03/05/16 2002 03/06/16 0105 03/06/16 0702  TROPONINI 0.03* 0.05* 0.04*   Glucose  Recent Labs Lab 03/08/16 0332 03/08/16 0749 03/08/16 1606 03/08/16 2021 03/08/16 2358 03/09/16 0416  GLUCAP 112* 136* 76 147* 123* 120*   DISCUSSION: 75 year old female with recent admission for PNA. At baseline patient has severe dementia and is non-verbal. Recently patient has had multiple events of aspiration. Presents to ED from SNF with diagnosis of PNA. During stay in ED patient had an aspiration event requiring intubation. ASSESSMENT / PLAN:  PULMONARY A: Acute on Chronic Respiratory Failure secondary to Aspiration (history of multiple aspiration events) Recent Admission for PNA (10/22-10/26) H/O COPD, bronchiectasis PCT elevated to 5.54>>4.75 Bibasilar opacities improved. P:   Vent Support. Apnea when weaned this morning. Mental status baerier to extubation.  SAT/SBT when able.  Antibiotics as listed below  Pulmicort 0.5 mg twice a day  Duonebs q6h  Consider Peg tube given recurrent aspiration > will talk to nephew Good diuretic response. Continue diuresis > decrease to daily and decrease free water 100 q6.  Still 4.8L up  CARDIOVASCULAR A:  Hypotension in setting of sepsis > off pressors now Pulm edema improving H/O HTN, Hyperlipidemia  Echo 11/21 EF 55-60% G1DD P:  Continue lasix 20 mg daily with dec free water. Peri [place]. Good response Cardiac Monitoring  HDS off levophed since 11/20 Hold all home HTN medications   RENAL A:   AKI (Baseline Cret 0.7-0.8) - likely  2/2 dehydration: improving Lactic acidosis resolved HyperNa &Cl resolved HypoK&Mg P:   Avoid NS or chloride pruducts Trend BMP Dec  free water to 100 q6 Continue lasix > daily KCl 40mEq x3 Mg 2 gm  GASTROINTESTINAL A:   H/O GERD,  Dysphagia (Multiple aspiration events)  Had BM 11/24 P:   PPI  TF Consider Peg tube given recurrent aspiration SLP when extuabted  HEMATOLOGIC A:   Leukocytosis improving Anemia Hgb 11>>8.9 likely dilutional P:  Trend CBC   INFECTIOUS A:   Asp PNA, bibasilar-improved. MRSA(+) nares.  Lactic Acidosis resolved Leukocytosis-improving Sepsis secondary to aspiration PNA P:   Trend WBC and fever curve Trend Procal F/u Cultures Continue Cefepime and vanc for now. Plan to swtich to unasyn  >  plan 7 d abx  ENDOCRINE A:   H/O Type 2 DM P:   SSI  Q4H CBG   NEUROLOGIC A:   Encephalopathy secondary to PNA; improved H/O Dementia - Baseline non-verbal  P:   Versed and Fentanyl PRN to achieve RASS goal  Home effexor RASS goal: 0  FAMILY  - Updates: Family updated at bedside on 11/21.   - Inter-disciplinary family meet or Palliative Care meeting due by:  03/12/16  Candelaria Stagers, MD.  03/09/16 7:12 AM PGY-2   ATTENDING NOTE / ATTESTATION NOTE :   I have discussed the case with the resident/APP  Candelaria Stagers MD.   I agree with the resident/APP's  history, physical examination, assessment, and plans.    I have edited the above note and modified it according to our agreed history, physical examination, assessment and plan. Note as above.    I have spent 30  minutes of critical care time with this patient today.  Family :Family updated at length today. Spoke to sister.   Pollie Meyer, MD 03/09/2016, 10:40 AM [facility] Pager [phone] After 3 pm or if no answer, call [phone]

## 2016-03-09 NOTE — Progress Notes (Signed)
Pharmacy Antibiotic Note  Sheila Cisneros is a 75 y.o. female admitted on 03/05/2016 with pneumonia.  Pharmacy has been consulted for Unasyn dosing.  Pt previously on Vanc + Cefepime. Narrowed to Unasyn today, total of 7 days planned, stop date in place.  Plan: - Stop vanc and cefepime - Unasyn 3 gm IV q8h - Stop date entered for 11/27  Height: 5\' 2"  (157.5 cm) Weight: 110 lb 3.7 oz (50 kg) IBW/kg (Calculated) : 50.1  Temp (24hrs), Avg:98.9 F (37.2 C), Min:98.7 F (37.1 C), Max:99.5 F (37.5 C)   Recent Labs Lab 03/05/16 1020  03/05/16 1305 03/05/16 1455 03/05/16 1824  03/06/16 0346 03/06/16 0702 03/06/16 0919  03/07/16 0522 03/07/16 0829 03/07/16 1822 03/08/16 0252 03/08/16 1741 03/09/16 0227  WBC 15.9*  --   --   --   --   --  15.7*  --   --   --   --  16.7*  --  14.5*  --  14.0*  CREATININE  --   < >  --  1.32*  1.32*  --   < > 1.52* 1.54* 1.59*  < > 1.31*  --  1.26* 1.18* 1.11* 1.09*  LATICACIDVEN  --   < > 4.96* 3.5* 3.9*  --   --  2.3* 1.4  --   --   --   --   --   --   --   < > = values in this interval not displayed.  Estimated Creatinine Clearance: 35.7 mL/min (by C-G formula based on SCr of 1.09 mg/dL (H)).    Allergies  Allergen Reactions  . Codeine Rash and Other (See Comments)     angioedema  . Neomycin Itching  . Penicillins Itching and Rash       . Tetanus Toxoids Rash  . Diphtheria Toxoid-Containing Vaccines Other (See Comments)  . Levofloxacin Rash    Antimicrobials this admission: Cefepime 11/20 >> 11/24 Vanc 11/20 >> 11/24 Unasyn 11/24 >> (11/27)  Dose adjustments this admission: none  Microbiology results: 11/20 BCx: ngtd 11/20 UCx: neg  11/23 Sputum: yeast - likely doesn't need treated, common contaminant  11/20 MRSA PCR: pos - mupirocin  Thank you for allowing pharmacy to be a part of this patient's care.  Deanna Wiater L. Bilbo Carcamo, PharmD Infectious Diseases Clinical Pharmacist Pager: [phone] 03/09/2016 11:03 AM

## 2016-03-10 ENCOUNTER — Inpatient Hospital Stay (HOSPITAL_COMMUNITY): Payer: Medicare Other

## 2016-03-10 LAB — GLUCOSE, CAPILLARY
GLUCOSE-CAPILLARY: 163 mg/dL — AB (ref 65–99)
GLUCOSE-CAPILLARY: 103 mg/dL — AB (ref 65–99)
GLUCOSE-CAPILLARY: 139 mg/dL — AB (ref 65–99)
GLUCOSE-CAPILLARY: 115 mg/dL — AB (ref 65–99)
GLUCOSE-CAPILLARY: 111 mg/dL — AB (ref 65–99)
Glucose-Capillary: 120 mg/dL — ABNORMAL HIGH (ref 65–99)

## 2016-03-10 LAB — CBC
HCT: 28.3 % — ABNORMAL LOW (ref 36.0–46.0)
Hemoglobin: 8.9 g/dL — ABNORMAL LOW (ref 12.0–15.0)
MCH: 27.3 pg (ref 26.0–34.0)
MCHC: 31.4 g/dL (ref 30.0–36.0)
MCV: 86.8 fL (ref 78.0–100.0)
PLATELETS: 296 10*3/uL (ref 150–400)
RBC: 3.26 MIL/uL — AB (ref 3.87–5.11)
RDW: 15.7 % — AB (ref 11.5–15.5)
WBC: 13.2 10*3/uL — AB (ref 4.0–10.5)

## 2016-03-10 LAB — CULTURE, RESPIRATORY W GRAM STAIN

## 2016-03-10 LAB — BASIC METABOLIC PANEL
Anion gap: 8 (ref 5–15)
BUN: 21 mg/dL — ABNORMAL HIGH (ref 6–20)
CHLORIDE: 109 mmol/L (ref 101–111)
CO2: 24 mmol/L (ref 22–32)
CREATININE: 1.01 mg/dL — AB (ref 0.44–1.00)
Calcium: 9 mg/dL (ref 8.9–10.3)
GFR, EST NON AFRICAN AMERICAN: 53 mL/min — AB (ref >60)
Glucose, Bld: 185 mg/dL — ABNORMAL HIGH (ref 65–99)
POTASSIUM: 4.2 mmol/L (ref 3.5–5.1)
SODIUM: 141 mmol/L (ref 135–145)

## 2016-03-10 LAB — CULTURE, BLOOD (ROUTINE X 2)
Culture: NO GROWTH
Culture: NO GROWTH

## 2016-03-10 LAB — MAGNESIUM: Magnesium: 2 mg/dL (ref 1.7–2.4)

## 2016-03-10 LAB — PHOSPHORUS: PHOSPHORUS: 3.2 mg/dL (ref 2.5–4.6)

## 2016-03-10 LAB — CULTURE, RESPIRATORY

## 2016-03-10 MED ORDER — VANCOMYCIN HCL IN DEXTROSE 750-5 MG/150ML-% IV SOLN
750.0000 mg | INTRAVENOUS | Status: AC
  Administered 2016-03-10 – 2016-03-14 (×5): 750 mg via INTRAVENOUS
  Filled 2016-03-10 (×5): qty 150

## 2016-03-10 MED ORDER — INSULIN ASPART 100 UNIT/ML ~~LOC~~ SOLN
2.0000 [IU] | SUBCUTANEOUS | Status: DC
  Administered 2016-03-11: 4 [IU] via SUBCUTANEOUS

## 2016-03-10 NOTE — Progress Notes (Signed)
PULMONARY / CRITICAL CARE MEDICINE   Name: Sheila Cisneros MRN: [id number] DOB: 11-Sep-1940    ADMISSION DATE:  03/05/2016  REFERRING MD:  Dr. Adela Lank   CHIEF COMPLAINT:  AMS, PNA   Brief Patient unresponsive, family not at bedside, information obtained from staff and medical record   75 year old female with PMH of GERD, Dementia, Type 2 DM, COPD, HTN, and Hyperlipidemia presents to the ED on 11/20 from [facility]. At baseline patient is non-verbal and has had multiple aspiration events. Yesterday, due to patient clinical status and recent admission for PNA from 10/22-10/26, a CXR was obtained which showed PNA. This morning patient was febrile and more altered then typical at facility, so was sent to ED. Upon arrival patient was alert on 2L [place], during stay in ED patient had an aspiration event where oxygenation was 78%, post-suctioning patient remained minimally responsive and hypotension - requiring intubation. PCCM called to admit.    STUDIES:  11/20 CXR > Bibasilar opacities, increasing on the right since prior study concerning for pneumonia  11/21 CXR > Bibasilar opacities, hardware in right place  11/22 CXR with improved bibasilar opacities, hardware in right place  11/24 CXR  Improved LLL and RML infiltrates compared to prior. Hardware in the right place.   CULTURES: Blood 11/20>>NGTD Urine 11/20>>NGTD Sputum 11/22>> MRSA PCR positive  ANTIBIOTICS: Vancomycin 11/20>> 11/24; restarted 11/25 >  Cefepime 11/20 >>11/24 Unasyn 11/24 > 11/25  SIGNIFICANT EVENTS: 10/22-10/26: Admission for HCAP  11/20 Admitted for acute hypoxic respiratory failure likely from aspiration 11/23 Drowsy and Tachypneic for SBT  LINES/TUBES: 11/20 3 PIV's 11/20 ETT 11/20 OGT  SUBJECTIVE:  Drowsier today than 11/24.  Apneic this am.  MRSA in sputum > switched back to vanc   VITAL SIGNS: Temp:  [98.6 F (37 C)-99.6 F (37.6 C)] 99.6 F (37.6 C) (11/25 1224) Pulse Rate:  [75-95] 87  (11/25 1100) Resp:  [16-27] 20 (11/25 1300) BP: (96-138)/(49-84) 105/64 (11/25 1300) SpO2:  [99 %-100 %] 100 % (11/25 1300) FiO2 (%):  [40 %] 40 % (11/25 1300) Weight:  [51.3 kg (113 lb 1.5 oz)] 51.3 kg (113 lb 1.5 oz) (11/25 0500) HEMODYNAMICS:   VENTILATOR SETTINGS: Vent Mode: PRVC FiO2 (%):  [40 %] 40 % Set Rate:  [20 bmp] 20 bmp Vt Set:  [400 mL] 400 mL PEEP:  [5 cmH20] 5 cmH20 Pressure Support:  [5 cmH20] 5 cmH20 Plateau Pressure:  [12 cmH20-16 cmH20] 15 cmH20 INTAKE / OUTPUT:  Intake/Output Summary (Last 24 hours) at 03/10/16 1339 Last data filed at 03/10/16 1300  Gross per 24 hour  Intake             1100 ml  Output             1725 ml  Net             -625 ml    PHYSICAL EXAMINATION: General: awake, appears calm, intubated. Drowsy on pst.  Neuro: slightly drowsy, intermittently follows commands, on full vent HEENT: ETT in place Cardiovascular: s1/s2, no MRG, RRR Lungs: air movement bilaterally, diminished at bases, no wheeze or crackles Abdomen: non-tender, active bowel sounds  Musculoskeletal: no deformities, trace edema bilaterally Skin: warm, dry, intact  LABS:  CBC  Recent Labs Lab 03/08/16 0252 03/09/16 0227 03/10/16 0452  WBC 14.5* 14.0* 13.2*  HGB 8.0* 8.9* 8.9*  HCT 25.8* 28.5* 28.3*  PLT 215 258 296   Coag's No results for input(s): APTT, INR in the last 168 hours. BMET  Recent Labs Lab 03/08/16 1741 03/09/16 0227 03/10/16 0452  NA 145 145 141  K 3.6 3.3* 4.2  CL 115* 111 109  CO2 23 25 24   BUN 24* 26* 21*  CREATININE 1.11* 1.09* 1.01*  GLUCOSE 95 122* 185*   Electrolytes  Recent Labs Lab 03/08/16 0252 03/08/16 1741 03/09/16 0227 03/10/16 0452  CALCIUM 8.6* 8.7* 9.0 9.0  MG 1.9  --  1.6* 2.0  PHOS 2.4*  --  3.2 3.2   Sepsis Markers  Recent Labs Lab 03/05/16 1441  03/05/16 1824 03/06/16 0346 03/06/16 0702 03/06/16 0919 03/07/16 0522  LATICACIDVEN  --   < > 3.9*  --  2.3* 1.4  --   PROCALCITON 1.52  --   --   5.54  --   --  4.75  < > = values in this interval not displayed. ABG  Recent Labs Lab 03/05/16 1359 03/06/16 0645 03/07/16 0704  PHART 7.316* 7.447 7.443  PCO2ART 42.9 28.4* 31.9*  PO2ART 279.0* 143* 137*   Liver Enzymes  Recent Labs Lab 03/05/16 1133  AST 21  ALT 21  ALKPHOS 100  BILITOT 0.4  ALBUMIN 1.9*   Cardiac Enzymes  Recent Labs Lab 03/05/16 2002 03/06/16 0105 03/06/16 0702  TROPONINI 0.03* 0.05* 0.04*   Glucose  Recent Labs Lab 03/09/16 1549 03/09/16 1948 03/09/16 2321 03/10/16 0419 03/10/16 0829 03/10/16 1226  GLUCAP 100* 140* 120* 163* 103* 139*   DISCUSSION: 75 year old female with recent admission for PNA. At baseline patient has severe dementia and is non-verbal. Recently patient has had multiple events of aspiration. Presents to ED from SNF with diagnosis of PNA. During stay in ED patient had an aspiration event requiring intubation. ASSESSMENT / PLAN:  PULMONARY A: Acute on Chronic Respiratory Failure secondary to Aspiration (history of multiple aspiration events), MRSA in sputum Recent Admission for PNA (10/22-10/26) H/O COPD, bronchiectasis PCT elevated to 5.54>>4.75 Bibasilar opacities improved. P:   Vent Support. Apnea when weaned this morning. Mental status baerier to extubation. Also with MRSA in sputum 11/25 SAT/SBT when able.  Antibiotics as listed below  Pulmicort 0.5 mg twice a day  Duonebs q6h  Consider Peg tube given recurrent aspiration > will need to discuss with family Cont lasix. Will d/c free water.    CARDIOVASCULAR A:  Hypotension in setting of sepsis > off pressors now Pulm edema improving H/O HTN, Hyperlipidemia  Echo 11/21 EF 55-60% G1DD P:  Continue lasix 20 mg daily. Will d/c free water.  Cardiac Monitoring  HDS off levophed since 11/20 Hold all home HTN medications   RENAL A:   AKI (Baseline Cret 0.7-0.8) - likely 2/2 dehydration: improving Lactic acidosis resolved HyperNa &Cl  resolved HypoK&Mg P:   Avoid NS or chloride pruducts Trend BMP Continue lasix daily. Will d/c free water   GASTROINTESTINAL A:   H/O GERD,  Dysphagia (Multiple aspiration events)  Had BM 11/24 P:   PPI  TF Consider Peg tube given recurrent aspiration SLP when extuabted  HEMATOLOGIC A:   Leukocytosis improving Anemia Hgb 11>>8.9 likely dilutional P:  Trend CBC   INFECTIOUS A:   Asp PNA, bibasilar-improved. MRSA(+) nares and sputum Lactic Acidosis resolved Leukocytosis-improving Sepsis secondary to aspiration PNA P:   Trend WBC and fever curve Trend Procal F/u Cultures Back on vanc.   ENDOCRINE A:   H/O Type 2 DM P:   SSI  Q4H CBG   NEUROLOGIC A:   Encephalopathy secondary to PNA; improved H/O Dementia - Baseline non-verbal  P:   Versed and Fentanyl PRN to achieve RASS goal  Home effexor RASS goal: 0  FAMILY  - Updates: Family updated at bedside on 11/21. No family at bedside 11/25  - Inter-disciplinary family meet or Palliative Care meeting due by:  03/12/16   I have spent 30  minutes of critical care time with this patient today.  Pollie MeyerJ. Angelo A de Dios, MD 03/10/2016, 1:39 PM [facility] Pager [phone] After 3 pm or if no answer, call [phone]

## 2016-03-10 NOTE — Progress Notes (Signed)
Pharmacy Antibiotic Note  Sheila Cisneros is a 75 y.o. female admitted on 03/05/2016 with pneumonia.  Pharmacy has been consulted for Unasyn dosing.  Pt previously on Vanc + Cefepime. Narrowed to Unasyn 11/24. However, trach aspirate is positive for MRSA. Will switch back to vancomycin.  Plan: Vancomycin 750mg  IV q24h Stop unasyn Monitor culture data, renal function and clinical course VT at SS prn  Height: 5\' 2"  (157.5 cm) Weight: 113 lb 1.5 oz (51.3 kg) IBW/kg (Calculated) : 50.1  Temp (24hrs), Avg:98.7 F (37.1 C), Min:97.5 F (36.4 C), Max:99.4 F (37.4 C)   Recent Labs Lab 03/05/16 1305 03/05/16 1455 03/05/16 1824  03/06/16 0346 03/06/16 0702 03/06/16 0919  03/07/16 0829 03/07/16 1822 03/08/16 0252 03/08/16 1741 03/09/16 0227 03/10/16 0452  WBC  --   --   --   --  15.7*  --   --   --  16.7*  --  14.5*  --  14.0* 13.2*  CREATININE  --  1.32*  1.32*  --   < > 1.52* 1.54* 1.59*  < >  --  1.26* 1.18* 1.11* 1.09* 1.01*  LATICACIDVEN 4.96* 3.5* 3.9*  --   --  2.3* 1.4  --   --   --   --   --   --   --   < > = values in this interval not displayed.  Estimated Creatinine Clearance: 38.6 mL/min (by C-G formula based on SCr of 1.01 mg/dL (H)).    Allergies  Allergen Reactions  . Codeine Rash and Other (See Comments)     angioedema  . Neomycin Itching  . Penicillins Itching and Rash       . Tetanus Toxoids Rash  . Diphtheria Toxoid-Containing Vaccines Other (See Comments)  . Levofloxacin Rash    Antimicrobials this admission: Cefepime 11/20 >> 11/24 Vanc 11/20 >> 11/24 Unasyn 11/24 >>11/25 Vanc 11/25 >>  Dose adjustments this admission: none  Microbiology results: 11/20 BCx: ngtd 11/20 UCx: neg  11/20 MRSA PCR: pos - mupirocin 11/22 TA: MRSA 11/23 Sputum: yeast - likely doesn't need treated, common contaminant    Arlean Hoppingorey M. Newman PiesBall, PharmD, BCPS Clinical Pharmacist Pager [phone] 03/10/2016 10:51 AM

## 2016-03-11 LAB — GLUCOSE, CAPILLARY
GLUCOSE-CAPILLARY: 116 mg/dL — AB (ref 65–99)
GLUCOSE-CAPILLARY: 116 mg/dL — AB (ref 65–99)
GLUCOSE-CAPILLARY: 125 mg/dL — AB (ref 65–99)
Glucose-Capillary: 102 mg/dL — ABNORMAL HIGH (ref 65–99)
Glucose-Capillary: 109 mg/dL — ABNORMAL HIGH (ref 65–99)
Glucose-Capillary: 156 mg/dL — ABNORMAL HIGH (ref 65–99)
Glucose-Capillary: 89 mg/dL (ref 65–99)

## 2016-03-11 LAB — BASIC METABOLIC PANEL
Anion gap: 11 (ref 5–15)
BUN: 20 mg/dL (ref 6–20)
CALCIUM: 9.1 mg/dL (ref 8.9–10.3)
CO2: 23 mmol/L (ref 22–32)
CREATININE: 0.97 mg/dL (ref 0.44–1.00)
Chloride: 108 mmol/L (ref 101–111)
GFR calc non Af Amer: 56 mL/min — ABNORMAL LOW (ref >60)
GLUCOSE: 51 mg/dL — AB (ref 65–99)
Potassium: 3.7 mmol/L (ref 3.5–5.1)
Sodium: 142 mmol/L (ref 135–145)

## 2016-03-11 LAB — CBC
HEMATOCRIT: 27.8 % — AB (ref 36.0–46.0)
Hemoglobin: 8.7 g/dL — ABNORMAL LOW (ref 12.0–15.0)
MCH: 27.3 pg (ref 26.0–34.0)
MCHC: 31.3 g/dL (ref 30.0–36.0)
MCV: 87.1 fL (ref 78.0–100.0)
Platelets: 345 10*3/uL (ref 150–400)
RBC: 3.19 MIL/uL — ABNORMAL LOW (ref 3.87–5.11)
RDW: 15.6 % — AB (ref 11.5–15.5)
WBC: 12.6 10*3/uL — ABNORMAL HIGH (ref 4.0–10.5)

## 2016-03-11 LAB — MAGNESIUM: MAGNESIUM: 1.9 mg/dL (ref 1.7–2.4)

## 2016-03-11 LAB — PHOSPHORUS: Phosphorus: 4.1 mg/dL (ref 2.5–4.6)

## 2016-03-11 MED ORDER — ORAL CARE MOUTH RINSE
15.0000 mL | Freq: Two times a day (BID) | OROMUCOSAL | Status: DC
  Administered 2016-03-11 – 2016-03-21 (×16): 15 mL via OROMUCOSAL

## 2016-03-11 NOTE — Progress Notes (Signed)
Pt placed on T-bar per Dr. Lucy Chrisios for a SBT. Will continue to monitor pt progress.

## 2016-03-11 NOTE — Procedures (Signed)
Extubation Procedure Note  Patient Details:   Name: Tiajuana Amassrrie L Silveri DOB: 1940/10/28 MRN: [id number]   Airway Documentation:     Evaluation  O2 sats: stable throughout Complications: No apparent complications Patient did tolerate procedure well. Bilateral Breath Sounds: Diminished   Yes  Pt extubated to a 4lpm [place]   Melanee Spryelson, Piper [place]ck Lawson 03/11/2016, 1:24 PM

## 2016-03-11 NOTE — Progress Notes (Signed)
PULMONARY / CRITICAL CARE MEDICINE   Name: Sheila Cisneros MRN: [id number] DOB: [birth year]    ADMISSION DATE:  03/05/2016  REFERRING MD:  Dr. Adela LankFloyd   CHIEF COMPLAINT:  AMS, PNA   Brief Patient unresponsive, family not at bedside, information obtained from staff and medical record   75 year old female with PMH of GERD, Dementia, Type 2 DM, COPD, HTN, and Hyperlipidemia presents to the ED on 11/20 from [facility]. At baseline patient is non-verbal and has had multiple aspiration events. Yesterday, due to patient clinical status and recent admission for PNA from 10/22-10/26, a CXR was obtained which showed PNA. This morning patient was febrile and more altered then typical at facility, so was sent to ED. Upon arrival patient was alert on 2L [place], during stay in ED patient had an aspiration event where oxygenation was 78%, post-suctioning patient remained minimally responsive and hypotension - requiring intubation. PCCM called to admit.    STUDIES:  11/20 CXR > Bibasilar opacities, increasing on the right since prior study concerning for pneumonia  11/21 CXR > Bibasilar opacities, hardware in right place  11/22 CXR with improved bibasilar opacities, hardware in right place  11/24 CXR  Improved LLL and RML infiltrates compared to prior. Hardware in the right place.   CULTURES: Blood 11/20>>NGTD Urine 11/20>>NGTD Sputum 11/22>> MRSA PCR positive  ANTIBIOTICS: Vancomycin 11/20>> 11/24; restarted 11/25 >  Cefepime 11/20 >>11/24 Unasyn 11/24 > 11/25  SIGNIFICANT EVENTS: 10/22-10/26: Admission for HCAP  11/20 Admitted for acute hypoxic respiratory failure likely from aspiration 11/23 Drowsy and Tachypneic for SBT  LINES/TUBES: 11/20 3 PIV's 11/20 ETT 11/20 OGT  SUBJECTIVE:  Did well on pst and tpiece trial. Extubated and looks fair.   VITAL SIGNS: Temp:  [97.4 F (36.3 C)-98.6 F (37 C)] 98.6 F (37 C) (11/26 1139) Pulse Rate:  [70-92] 83 (11/26 1900) Resp:  [11-22]  14 (11/26 1900) BP: (90-144)/(53-86) 143/81 (11/26 1900) SpO2:  [95 %-100 %] 100 % (11/26 1900) FiO2 (%):  [40 %] 40 % (11/26 1239) Weight:  [49.9 kg (110 lb 0.2 oz)] 49.9 kg (110 lb 0.2 oz) (11/26 0455) HEMODYNAMICS:   VENTILATOR SETTINGS: Vent Mode: CPAP;PSV FiO2 (%):  [40 %] 40 % Set Rate:  [20 bmp] 20 bmp Vt Set:  [400 mL] 400 mL PEEP:  [5 cmH20] 5 cmH20 Pressure Support:  [5 cmH20-8 cmH20] 8 cmH20 Plateau Pressure:  [15 cmH20-16 cmH20] 16 cmH20 INTAKE / OUTPUT:  Intake/Output Summary (Last 24 hours) at 03/11/16 1936 Last data filed at 03/11/16 1800  Gross per 24 hour  Intake            922.5 ml  Output             1240 ml  Net           -317.5 ml    PHYSICAL EXAMINATION: General: awake, appears calm.  Neuro: CN grossly intact. (-) lat signs HEENT: ETT in place Cardiovascular: s1/s2, no MRG, RRR Lungs: air movement bilaterally, diminished at bases, no wheeze or crackles Abdomen: non-tender, active bowel sounds  Musculoskeletal: no deformities, trace edema bilaterally Skin: warm, dry, intact  LABS:  CBC  Recent Labs Lab 03/09/16 0227 03/10/16 0452 03/11/16 0512  WBC 14.0* 13.2* 12.6*  HGB 8.9* 8.9* 8.7*  HCT 28.5* 28.3* 27.8*  PLT 258 296 345   Coag's No results for input(s): APTT, INR in the last 168 hours. BMET  Recent Labs Lab 03/09/16 0227 03/10/16 0452 03/11/16 0245  NA 145 141  142  K 3.3* 4.2 3.7  CL 111 109 108  CO2 25 24 23   BUN 26* 21* 20  CREATININE 1.09* 1.01* 0.97  GLUCOSE 122* 185* 51*   Electrolytes  Recent Labs Lab 03/09/16 0227 03/10/16 0452 03/11/16 0245  CALCIUM 9.0 9.0 9.1  MG 1.6* 2.0 1.9  PHOS 3.2 3.2 4.1   Sepsis Markers  Recent Labs Lab 03/05/16 1441  03/05/16 1824 03/06/16 0346 03/06/16 0702 03/06/16 0919 03/07/16 0522  LATICACIDVEN  --   < > 3.9*  --  2.3* 1.4  --   PROCALCITON 1.52  --   --  5.54  --   --  4.75  < > = values in this interval not displayed. ABG  Recent Labs Lab 03/05/16 1359  03/06/16 0645 03/07/16 0704  PHART 7.316* 7.447 7.443  PCO2ART 42.9 28.4* 31.9*  PO2ART 279.0* 143* 137*   Liver Enzymes  Recent Labs Lab 03/05/16 1133  AST 21  ALT 21  ALKPHOS 100  BILITOT 0.4  ALBUMIN 1.9*   Cardiac Enzymes  Recent Labs Lab 03/05/16 2002 03/06/16 0105 03/06/16 0702  TROPONINI 0.03* 0.05* 0.04*   Glucose  Recent Labs Lab 03/10/16 1609 03/10/16 2011 03/11/16 0014 03/11/16 0505 03/11/16 0813 03/11/16 1141  GLUCAP 115* 111* 156* 89 109* 125*   DISCUSSION: 75 year old female with recent admission for PNA. At baseline patient has severe dementia and is non-verbal. Recently patient has had multiple events of aspiration. Presents to ED from SNF with diagnosis of PNA. During stay in ED patient had an aspiration event requiring intubation. ASSESSMENT / PLAN:  PULMONARY A: Acute on Chronic Respiratory Failure secondary to Aspiration (history of multiple aspiration events), MRSA in sputum Recent Admission for PNA (10/22-10/26) H/O COPD, bronchiectasis PCT elevated to 5.54>>4.75 Bibasilar opacities improved. P:   Extubated and looks fair. High risk for reintubation 2/2 baseline dementia Antibiotics as listed below  Pulmicort 0.5 mg twice a day  Duonebs q6h  Consider Peg tube given recurrent aspiration > will need to discuss with family Cont lasix.     CARDIOVASCULAR A:  Hypotension in setting of sepsis > off pressors now Pulm edema improving H/O HTN, Hyperlipidemia  Echo 11/21 EF 55-60% G1DD P:  Continue lasix 20 mg daily.  Cardiac Monitoring  HDS off levophed since 11/20 Hold all home HTN medications   RENAL A:   AKI (Baseline Cret 0.7-0.8) - likely 2/2 dehydration: improving Lactic acidosis resolved HyperNa &Cl resolved HypoK&Mg P:   Avoid NS or chloride pruducts Trend BMP Continue lasix daily.    GASTROINTESTINAL A:   H/O GERD,  Dysphagia (Multiple aspiration events)  Had BM 11/24 P:   PPI  TF Consider Peg tube  given recurrent aspiration SLP when extuabted  HEMATOLOGIC A:   Leukocytosis improving Anemia Hgb 11>>8.9 likely dilutional P:  Trend CBC   INFECTIOUS A:   Asp PNA, bibasilar-improved. MRSA(+) nares and sputum Lactic Acidosis resolved Leukocytosis-improving Sepsis secondary to aspiration PNA P:   Trend WBC and fever curve Trend Procal F/u Cultures Back on vanc.   ENDOCRINE A:   H/O Type 2 DM P:   SSI  Q4H CBG   NEUROLOGIC A:   Encephalopathy secondary to PNA; improved H/O Dementia - Baseline non-verbal  P:   Versed and Fentanyl PRN to achieve RASS goal > deescalate since extubated.  Home effexor RASS goal: 0  FAMILY  - Updates: Family updated at bedside on 11/21. No family at bedside 11/25  - Inter-disciplinary family meet or  Palliative Care meeting due by:  03/12/16   I have spent 30  minutes of critical care time with this patient today.  Pollie Meyer, MD 03/11/2016, 7:36 PM [facility] Pager [phone] After 3 pm or if no answer, call [phone]

## 2016-03-12 ENCOUNTER — Inpatient Hospital Stay (HOSPITAL_COMMUNITY): Payer: Medicare Other

## 2016-03-12 LAB — BASIC METABOLIC PANEL
Anion gap: 10 (ref 5–15)
BUN: 15 mg/dL (ref 6–20)
CALCIUM: 9.3 mg/dL (ref 8.9–10.3)
CO2: 25 mmol/L (ref 22–32)
CREATININE: 0.92 mg/dL (ref 0.44–1.00)
Chloride: 105 mmol/L (ref 101–111)
GFR, EST NON AFRICAN AMERICAN: 60 mL/min — AB (ref >60)
Glucose, Bld: 120 mg/dL — ABNORMAL HIGH (ref 65–99)
Potassium: 3.4 mmol/L — ABNORMAL LOW (ref 3.5–5.1)
SODIUM: 140 mmol/L (ref 135–145)

## 2016-03-12 LAB — GLUCOSE, CAPILLARY
GLUCOSE-CAPILLARY: 107 mg/dL — AB (ref 65–99)
GLUCOSE-CAPILLARY: 110 mg/dL — AB (ref 65–99)
Glucose-Capillary: 118 mg/dL — ABNORMAL HIGH (ref 65–99)
Glucose-Capillary: 120 mg/dL — ABNORMAL HIGH (ref 65–99)
Glucose-Capillary: 130 mg/dL — ABNORMAL HIGH (ref 65–99)

## 2016-03-12 LAB — CBC
HCT: 29 % — ABNORMAL LOW (ref 36.0–46.0)
Hemoglobin: 9 g/dL — ABNORMAL LOW (ref 12.0–15.0)
MCH: 27.2 pg (ref 26.0–34.0)
MCHC: 31 g/dL (ref 30.0–36.0)
MCV: 87.6 fL (ref 78.0–100.0)
PLATELETS: 398 10*3/uL (ref 150–400)
RBC: 3.31 MIL/uL — AB (ref 3.87–5.11)
RDW: 15.8 % — AB (ref 11.5–15.5)
WBC: 10.9 10*3/uL — AB (ref 4.0–10.5)

## 2016-03-12 LAB — MAGNESIUM: Magnesium: 1.6 mg/dL — ABNORMAL LOW (ref 1.7–2.4)

## 2016-03-12 LAB — PHOSPHORUS: PHOSPHORUS: 4.3 mg/dL (ref 2.5–4.6)

## 2016-03-12 MED ORDER — DEXTROSE-NACL 5-0.45 % IV SOLN
INTRAVENOUS | Status: DC
  Administered 2016-03-12 – 2016-03-21 (×10): via INTRAVENOUS
  Filled 2016-03-12 (×12): qty 1000

## 2016-03-12 MED ORDER — INSULIN ASPART 100 UNIT/ML ~~LOC~~ SOLN
0.0000 [IU] | Freq: Three times a day (TID) | SUBCUTANEOUS | Status: DC
  Administered 2016-03-13: 1 [IU] via SUBCUTANEOUS
  Administered 2016-03-13: 2 [IU] via SUBCUTANEOUS

## 2016-03-12 MED ORDER — SODIUM CHLORIDE 0.9 % IV SOLN
30.0000 meq | Freq: Once | INTRAVENOUS | Status: AC
  Administered 2016-03-12: 30 meq via INTRAVENOUS
  Filled 2016-03-12: qty 15

## 2016-03-12 MED ORDER — MAGNESIUM SULFATE 2 GM/50ML IV SOLN
2.0000 g | Freq: Once | INTRAVENOUS | Status: AC
  Administered 2016-03-12: 2 g via INTRAVENOUS
  Filled 2016-03-12: qty 50

## 2016-03-12 MED ORDER — INSULIN ASPART 100 UNIT/ML ~~LOC~~ SOLN: 1.0000 [IU] | SUBCUTANEOUS | Status: DC

## 2016-03-12 NOTE — Clinical Social Work Note (Signed)
Clinical Social Work Assessment  Patient Details  Name: Sheila Cisneros MRN: [id number] Date of Birth: 29-Sep-1940  Date of referral:  03/12/16               Reason for consult:  Facility Placement                Permission sought to share information with:  Facility Medical sales representativeContact Representative, Family Supports Permission granted to share information::  Yes, Verbal Permission Granted  Name::     Lorin PicketScott [place](Nephew) [phone]  Agency::  [facility]  Relationship::     Contact Information:     Housing/Transportation Living arrangements for the past 2 months:  Skilled Nursing Facility Source of Information:  Patient, Other (Comment Required) Lorin Picket([name](Nephew) [phone]) Patient Interpreter Needed:  None Criminal Activity/Legal Involvement Pertinent to Current Situation/Hospitalization:  No - Comment as needed Significant Relationships:  Other Family Members Lives with:  Facility Resident Do you feel safe going back to the place where you live?  Yes Need for family participation in patient care:  Yes (Comment)  Care giving concerns:  No care giving concerns reported. [facility] and agreeable to return if is the medical recommendation. Patient and Patient's nephew do report that they would like SNF placement to be temporary with the long term goal of Patient returning home with 24 hr care.   Social Worker assessment / plan: Patient is a [age] YO African American female with PMH of GERD, Dementia, Type 2 DM, COPD, HTN, and hyperlipidemia who [facility]. CSW engaged with Patient's nephew Lorin Picket([name], [phone]) and Patient at Patient's bedside. CSW introduced self, role of CSW, and began discussion of discharge planning. Per Patient's nephew, Patient does not want to return to SNF at discharge. He is working on 24 hr care for Patient. CSW has notified RN Sports coachCase Manager. CSW discussed plan in the event that PT recommends SNF. Per Nephew, if PT and  MD recommends SNF, his aunt would be open to returning to [facility], but only temporarily. After discussing medical recommendation with MD during rounds, SNF placement is being recommended at discharge. CSW discussed this with Patient and Nephew. Both in agreement to [facility] with long term goal of returning home with 24 hr care. CSW to facilitate transfer back to SNF when medically stable and appropriate for discharge.    Employment status:  Retired Database administratornsurance information:  Managed Medicare PT Recommendations:  Skilled Nursing Facility Information / Referral to community resources:  Skilled Nursing Facility  Patient/Family's Response to care:  Patient and Patient's family appreciative of care received at this time.   Patient/Family's Understanding of and Emotional Response to Diagnosis, Current Treatment, and Prognosis:  Patient's nephew reports an understanding of diagnosis, current treatment, and prognosis. Patient and Patient's nephew report understanding that at this time, SNF placement is being recommended. Despite Patient and Patient's nephew wanting Patient to return home, they understand the need for continued skilled nursing facility placement at discharge.   Emotional Assessment Appearance:  Appears stated age Attitude/Demeanor/Rapport:  Avoidant Affect (typically observed):  Flat, Quiet, Calm Orientation:  Oriented to Self Alcohol / Substance use:  Not Applicable Psych involvement (Current and /or in the community):  No (Comment)  Discharge Needs  Concerns to be addressed:  Discharge Planning Concerns, Care Coordination Readmission within the last 30 days:  Yes Current discharge risk:  None Barriers to Discharge:  No Barriers Identified   Rockwell Germanyshley N Gardner, LCSW 03/12/2016,  12:59 PM

## 2016-03-12 NOTE — Progress Notes (Signed)
eLink Physician-Brief Progress Note Patient Name: Tiajuana Amassrrie L Youtz DOB: [birth year] MRN: [id number]   Date of Service  03/12/2016  HPI/Events of Note  k , mag low  eICU Interventions  supp     Intervention Category Intermediate Interventions: Electrolyte abnormality - evaluation and management  FEINSTEIN,DANIEL J. 03/12/2016, 5:01 AM

## 2016-03-12 NOTE — Progress Notes (Addendum)
CSW continues to follow for disposition. CSW engaged with Patient's nephew Lorin Picket([name], [phone]) per request from RN. Per Patient's nephew, Patient does not want to return to SNF at discharge. He is working on 24 hr care for Patient. CSW has notified RN Sports coachCase Manager. CSW discussed plan in the event that PT recommends SNF. Per Nephew, if PT recommends SNF, his aunt would be open to returning to [facility], but only temporarily. Per MD, needs discussion about peg tube if Pt fails SLP today.          Lance MussAshley Gardner,MSW, LCSW [facility] ED/15M Clinical Social Worker [phone]

## 2016-03-12 NOTE — Progress Notes (Signed)
Modified Barium Swallow Progress Note  Patient Details  Name: Sheila Cisneros Oneil MRN: [id number] Date of Birth: 07/09/1940  Today's Date: 03/12/2016  Modified Barium Swallow completed.  Full report located under Chart Review in the Imaging Section.  Brief recommendations include the following:  Clinical Impression  Pt presents with mild oral and severe pharyngeal dysphagia. She had increasing lingual residue as PO trials were given. Oral holding was displayed during trials of pureed solids and Mod cues were needed to initiate a swallow; however, pt's airway remained protected. Pt presented with a delay in swallow initiation to the valleculae, reduced airway closure and pharyngeal peristalsis which resulted in events of silent penetration/aspiration with thin and nectar thick liquid trials. Despite Mod cueing, pt was unable to perform compensatory startegies to aid in airway protection (i.e. chin tuck, cued cough). Vallecular residue was observed throughout MBS that was minimized when pt spontaneously had additional swallows. Pt had a coordinated and timely swallow with honey thick liquids but vallecular residuals were silently penetrated and ultimately aspirated following the swallow. Although pt had reduced sensation she did reflexively cough following aspiration of honey thick liquids. Recommend pt remain NPO except for medications crushed in puree because of the increased time and cueing needed to eliminate oral holding observed with puree consistencies. Per chart review, pt's recent barium swallow (02/09/16) showed no events of aspiration, thus her performance on MBS may likely be due to her prolonged intubation which means there is potential for improvement over the next few days. Will continue to follow acutely and administer PO trials at bedside.   Swallow Evaluation Recommendations       SLP Diet Recommendations: NPO except meds       Medication Administration: Crushed with puree   Supervision: Staff to assist with self feeding       Postural Changes: Seated upright at 90 degrees   Oral Care Recommendations: Oral care QID       Tollie EthHaleigh Ragan Roshawna Colclasure, Student SLP  Caryl NeverHaleigh R Daevion Navarette 03/12/2016,3:17 PM

## 2016-03-12 NOTE — Progress Notes (Signed)
PULMONARY / CRITICAL CARE MEDICINE   Name: Tiajuana Amassrrie L Gaiser MRN: [id number] DOB: 1940/06/07    ADMISSION DATE:  03/05/2016  REFERRING MD:  Dr. Adela LankFloyd   CHIEF COMPLAINT:  AMS, PNA   Brief Patient unresponsive, family not at bedside, information obtained from staff and medical record   75 year old female with PMH of GERD, Dementia, Type 2 DM, COPD, HTN, and Hyperlipidemia presents to the ED [facility]. At baseline patient is non-verbal and has had multiple aspiration events. Yesterday, due to patient clinical status and recent admission for PNA from 10/22-10/26, a CXR was obtained which showed PNA. This morning patient was febrile and more altered then typical at facility, so was sent to ED. Upon arrival patient was alert on 2L [place], during stay in ED patient had an aspiration event where oxygenation was 78%, post-suctioning patient remained minimally responsive and hypotension - requiring intubation. PCCM called to admit.    STUDIES:  11/20 CXR > Bibasilar opacities, increasing on the right since prior study concerning for pneumonia  11/21 CXR > Bibasilar opacities, hardware in right place  11/22 CXR with improved bibasilar opacities, hardware in right place  11/24 CXR  Improved LLL and RML infiltrates compared to prior. Hardware in the right place.   CULTURES: Blood 11/20>>NGTD Urine 11/20>>NGTD Sputum 11/22>>fwe MRSA MRSA PCR positive  ANTIBIOTICS: Vancomycin 11/20>> 11/24; restarted 11/25 >  Cefepime 11/20 >>11/24 Unasyn 11/24 > 11/25  SIGNIFICANT EVENTS: 10/22-10/26: Admission for HCAP  11/20 Admitted for acute hypoxic respiratory failure likely from aspiration 11/23 Drowsy and Tachypneic for SBT  LINES/TUBES: 11/20 3 PIV's 11/20 ETT>>11/26 11/20 OGT>>11/26  SUBJECTIVE:  Extubated. Conversant.  Very weak cough.  Pleasantly confused.    VITAL SIGNS: Temp:  [97.4 F (36.3 C)-98.6 F (37 C)] 98.5 F (36.9 C) (11/27 0420) Pulse Rate:  [70-92] 87  (11/27 0600) Resp:  [11-21] 18 (11/27 0600) BP: (116-148)/(65-93) 148/88 (11/27 0600) SpO2:  [95 %-100 %] 99 % (11/27 0600) FiO2 (%):  [40 %] 40 % (11/26 1239) Weight:  [103 lb 13.4 oz (47.1 kg)] 103 lb 13.4 oz (47.1 kg) (11/27 0500) HEMODYNAMICS:   VENTILATOR SETTINGS: Vent Mode: CPAP;PSV FiO2 (%):  [40 %] 40 % PEEP:  [5 cmH20] 5 cmH20 Pressure Support:  [5 cmH20-8 cmH20] 8 cmH20 INTAKE / OUTPUT:  Intake/Output Summary (Last 24 hours) at 03/12/16 0653 Last data filed at 03/12/16 0531  Gross per 24 hour  Intake            632.5 ml  Output             1325 ml  Net           -692.5 ml    PHYSICAL EXAMINATION: General: awake, follows commands, pleasantly confused..  Neuro: CN grossly intact. (-) lat signs HEENT: calm on RA Cardiovascular: s1/s2, no MRG, RRR Lungs: on room air, air movement bilaterally, diminished at bases, no wheeze or crackles Abdomen: non-tender, active bowel sounds  Musculoskeletal: no deformities, trace edema bilaterally Skin: warm, dry, intact  LABS:  CBC  Recent Labs Lab 03/10/16 0452 03/11/16 0512 03/12/16 0226  WBC 13.2* 12.6* 10.9*  HGB 8.9* 8.7* 9.0*  HCT 28.3* 27.8* 29.0*  PLT 296 345 398   Coag's No results for input(s): APTT, INR in the last 168 hours. BMET  Recent Labs Lab 03/10/16 0452 03/11/16 0245 03/12/16 0226  NA 141 142 140  K 4.2 3.7 3.4*  CL 109 108 105  CO2 24 23 25  BUN 21* 20 15  CREATININE 1.01* 0.97 0.92  GLUCOSE 185* 51* 120*   Electrolytes  Recent Labs Lab 03/10/16 0452 03/11/16 0245 03/12/16 0226  CALCIUM 9.0 9.1 9.3  MG 2.0 1.9 1.6*  PHOS 3.2 4.1 4.3   Sepsis Markers  Recent Labs Lab 03/05/16 1441  03/05/16 1824 03/06/16 0346 03/06/16 0702 03/06/16 0919 03/07/16 0522  LATICACIDVEN  --   < > 3.9*  --  2.3* 1.4  --   PROCALCITON 1.52  --   --  5.54  --   --  4.75  < > = values in this interval not displayed. ABG  Recent Labs Lab 03/05/16 1359 03/06/16 0645 03/07/16 0704  PHART  7.316* 7.447 7.443  PCO2ART 42.9 28.4* 31.9*  PO2ART 279.0* 143* 137*   Liver Enzymes  Recent Labs Lab 03/05/16 1133  AST 21  ALT 21  ALKPHOS 100  BILITOT 0.4  ALBUMIN 1.9*   Cardiac Enzymes  Recent Labs Lab 03/05/16 2002 03/06/16 0105 03/06/16 0702  TROPONINI 0.03* 0.05* 0.04*   Glucose  Recent Labs Lab 03/11/16 0813 03/11/16 1141 03/11/16 1655 03/11/16 2010 03/11/16 2354 03/12/16 0418  GLUCAP 109* 125* 116* 102* 116* 118*   DISCUSSION: 75 year old female with recent admission for PNA. At baseline patient has severe dementia and is non-verbal. Recently patient has had multiple events of aspiration. Presents to ED from SNF with diagnosis of PNA. During stay in ED patient had an aspiration event requiring intubation. Improved clinically while in the unit and extubated 03/11/2016 ASSESSMENT / PLAN:  PULMONARY A: Acute on Chronic Respiratory Failure secondary to Aspiration (history of multiple aspiration events), MRSA in sputum Recent Admission for PNA (10/22-10/26) H/O COPD, bronchiectasis Bibasilar opacities improved. P:   Extubated 11/26 to [place] and did well overnight. Main issues is need to talk to family re: code status. Pt has recurrent asp pna. Need to discuss re: PEG tube. Otherwise, she will be back with the same issue.  Antibiotics as below > Vanc D8 > plan 10 days total.  Pulmicort 0.5 mg twice a day  Duonebs q6h  Will d/c lasix.   CARDIOVASCULAR A:  S/P hypotension 2/2 hypovoplemia and sepsis Normotensive now.  Pulm edema improving H/O HTN, Hyperlipidemia  Echo 11/21 EF 55-60% G1DD P:  Will d/c lasix. May resume home meds once cleared by SLP. PRN lopressor IV for now. BP ok.   RENAL A:   S/P AKI 2/2 hypovolemia.  Lactic acidosis resolved HyperNa &Cl resolved HypoK&Mg P:   Trend BMP   GASTROINTESTINAL A:   H/O GERD,  Dysphagia (Multiple aspiration events)  Had BM 11/26 P:   SLP. If she fails, consider peg tube > need to talk to  family.  May need coretrack  HEMATOLOGIC A:   Leukocytosis improving Anemia Hgb 11>>9 P:  Trend CBC  No active bleed  INFECTIOUS A:   MRSA PNA. Asp pna.  Lactic Acidosis resolved Leukocytosis-improving Sepsis secondary to aspiration PNA P:   Trend WBC. Afebrile here Plan 10 d Vanc for now.   ENDOCRINE A:   H/O Type 2 DM P:   SSI  Q4H CBG   NEUROLOGIC A:   Encephalopathy secondary to PNA; improved She seems at baseline today.  H/O Dementia - Baseline non-verbal  P:   Home effexor RASS goal: 0  FAMILY  - Updates: No family at bedside side today. Need discussion about peg if she fails SLP today  - Inter-disciplinary family meet or Palliative Care meeting due by:  03/12/16  Transfer to floors.  TRH will be primary 11/28, PCCM off then. Dr. Sharon Seller aware.   Addendum 11/27 2 pm :  Pt failed Ba exam.  Need to repeat in 1-2 days per speech. Also, I extensively discussed pts over all condition with pt's nephew who has POA Mr. Cherlynn Polo.  I discussed re : need for PEG tube and he needs to discuss this with pt and family.  I asked about code status and need for trache potentially if she has recurrent resp failure.  He will discuss with pt and family.  I told him he and pt need to decide on PEG before discharge.    Pollie Meyer, MD 03/12/2016, 8:43 AM [facility] Pager [phone] After 3 pm or if no answer, call [phone]

## 2016-03-12 NOTE — Progress Notes (Signed)
   03/12/16 1330  Clinical Encounter Type  Visited With Patient and family together  Visit Type Social support  Referral From Social work  Consult/Referral To MetallurgistChaplain  Spiritual Encounters  Spiritual Needs Emotional  Stress Factors  Patient Stress Factors Health changes  Family Stress Factors Family relationships  Introduction to Pt and family. Discussed advanced directive. Notarized. Original and copy to Pt. And POA. Copy to chart.

## 2016-03-12 NOTE — Progress Notes (Signed)
Nutrition Follow-up  DOCUMENTATION CODES:   Not applicable  INTERVENTION:    D/C current TF order, patient has no enteral access.  Consider Cortrak tube placement for TF if unable to safely advance diet tomorrow.   Jevity 1.2 at 55 ml/h would provide 1584 kcal, 73 gm protein, 972 ml free water daily.  NUTRITION DIAGNOSIS:   Inadequate oral intake related to inability to eat as evidenced by NPO status.  Ongoing  GOAL:   Patient will meet greater than or equal to 90% of their needs  Unmet  MONITOR:   Vent status, Labs, TF tolerance, Skin, I & O's  REASON FOR ASSESSMENT:   Consult Enteral/tube feeding initiation and management  ASSESSMENT:   75 year old female with recent admission for PNA. At baseline patient has severe dementia and is non-verbal. Recently patient has had multiple events of aspiration. Presents to ED from SNF with diagnosis of PNA. During stay in ED patient had an aspiration event requiring intubation.   Discussed patient in ICU rounds and with RN today. Patient was extubated on 11/26.  Failed MBS earlier today. Remains NPO at this time. TF off since extubation yesterday. Currently no enteral access available. SLP to re-evaluate patient tomorrow. Patient may require PEG. Labs reviewed: potassium and magnesium are low. Medications reviewed. CBG's: 118-107-120  Diet Order:  Diet NPO time specified  Skin:  Wound (see comment) (stage II to buttocks)  Last BM:  11/26  Height:   Ht Readings from Last 1 Encounters:  03/05/16 5\' 2"  (1.575 m)    Weight:   Wt Readings from Last 1 Encounters:  03/12/16 103 lb 13.4 oz (47.1 kg)    Ideal Body Weight:  50 kg  BMI:  Body mass index is 18.99 kg/m.  Estimated Nutritional Needs:   Kcal:  1350-1550  Protein:  70-80 gm  Fluid:  1.5 L  EDUCATION NEEDS:   No education needs identified at this time  Joaquin CourtsKimberly Harris, RD, LDN, CNSC Pager [phone] After Hours Pager [phone]

## 2016-03-12 NOTE — Clinical Social Work Placement (Signed)
   CLINICAL SOCIAL WORK PLACEMENT  NOTE  Date:  03/12/2016  Patient Details  Name: Sheila Cisneros MRN: [id number] Date of Birth: 1941-01-11  Clinical Social Work is seeking post-discharge placement for this patient at the Skilled  Nursing Facility level of care (*CSW will initial, date and re-position this form in  chart as items are completed):  Yes   Patient/family provided with [facility] Clinical Social Work Department's list of facilities offering this level of care within the geographic area requested by the patient (or if unable, by the patient's family).  Yes   Patient/family informed of their freedom to choose among providers that offer the needed level of care, that participate in Medicare, Medicaid or managed care program needed by the patient, have an available bed and are willing to accept the patient.  Yes   Patient/family informed of [facility]'s ownership interest in [facility] and [facility], as well as of the fact that they are under no obligation to receive care at these facilities.  PASRR submitted to EDS on 03/12/16     PASRR number received on 03/12/16     Existing PASRR number confirmed on 03/12/16     FL2 transmitted to all facilities in geographic area requested by pt/family on 03/12/16     FL2 transmitted to all facilities within larger geographic area on       Patient informed that his/her managed care company has contracts with or will negotiate with certain facilities, including the following:        Yes   Patient/family informed of bed offers received.  Patient chooses bed at  [facility] )     Physician recommends and patient chooses bed at      Patient to be transferred to  [facility] ) on  .  Patient to be transferred to facility by  Sharin Mons([facility]AR)     Patient family notified on   of transfer.  Name of family member notified:   Lorin Picket([name](Nephew) [phone])     PHYSICIAN Please prepare prescriptions,  Please sign FL2, Please prepare priority discharge summary, including medications     Additional Comment:    _______________________________________________ Rockwell GermanyAshley N Gardner, LCSW 03/12/2016, 2:00 PM

## 2016-03-12 NOTE — Evaluation (Signed)
Clinical/Bedside Swallow Evaluation Patient Details  Name: Sheila Cisneros MRN: [id number] Date of Birth: 01/30/1941  Today's Date: 03/12/2016 Time: SLP Start Time (ACUTE ONLY): 78290905 SLP Stop Time (ACUTE ONLY): 0921 SLP Time Calculation (min) (ACUTE ONLY): 16 min  Past Medical History:  Past Medical History:  Diagnosis Date  . Altered mental status 12/09/2012  . Arthritis    "all over" (12/09/2012)  . Chronic bronchitis (HCC)    "couple times/yr" (12/09/2012)  . Chronic lower back pain   . COPD (chronic obstructive pulmonary disease) (HCC)   . Diabetes mellitus without complication (HCC)   . Exertional shortness of breath   . GERD (gastroesophageal reflux disease)    rolaids if needed  . Gout, unspecified   . Hepatitis, unspecified    "the one that's not bad" (12/09/2012)  . History of blood transfusion    "related to a surgery, I think" (12/09/2012)  . Hyperlipidemia   . Hypertension   . Migraines    "in the past" (12/09/2012)  . Osteoarthrosis, unspecified whether generalized or localized, unspecified site   . Other chronic sinusitis   . Pneumonia    "couple times; long time ago" (12/09/2012)  . Unspecified chronic bronchitis    sees Dr. Melba Coon. Young, last visit 08/2011, treated for E. Coli- resp. /w Ceftin, Feb. 2013   Past Surgical History:  Past Surgical History:  Procedure Laterality Date  . ABDOMINAL HYSTERECTOMY     "w/right ovary" (12/09/2012)  . APPENDECTOMY    . BREAST BIOPSY Right   . CARPAL TUNNEL RELEASE  09/06/2011   Procedure: CARPAL TUNNEL RELEASE;  Surgeon: Kennieth RadArthur F Carter, MD;  [facility];  Service: Orthopedics;  Laterality: Right;  . ESOPHAGOGASTRODUODENOSCOPY N/A 06/04/2013   Procedure: ESOPHAGOGASTRODUODENOSCOPY (EGD);  Surgeon: Graylin ShiverSalem F Ganem, MD;  [facility];  Service: Endoscopy;  Laterality: N/A;  . SHOULDER ARTHROSCOPY W/ ROTATOR CUFF REPAIR Right   . TONSILLECTOMY    . TUBAL LIGATION     HPI:  75 year old female with PMH of GERD,  Dementia, Type 2 DM, COPD, HTN, recurrent PNA and Hyperlipidemia. Pt had recent admission for PNA from 10/22-10/26 and presented for this admission with PNA. Intubated on 11/20 and extubated on 11/26. CXR show increasing left basilar infiltrate.    Assessment / Plan / Recommendation Clinical Impression  Bedside swallow evaluation completed post 6 day intubation. [facility], but her cough was weak. Minimal trials of ice chips, thin liquids, and puree were administered and immediate, weak throat clearing was observed. Pt's RR fluctuated between the upper 20's and low 30's during PO trials. She consistently inhaled following PO trials, which increases her risk for respiratory based dysphagia. Pt has h/o recurrent aspiration PNA and per chart review MD is considering PEG placement. Recommend proceeding with MBS to further investigate her swallow fxn with the intent to initiate the safest possible diet.    Aspiration Risk  Severe aspiration risk    Diet Recommendation NPO   Medication Administration: Via alternative means    Other  Recommendations Oral Care Recommendations: Oral care QID   Follow up Recommendations Other (comment) (tba)      Frequency and Duration min 2x/week  2 weeks       Prognosis Prognosis for Safe Diet Advancement: Good Barriers to Reach Goals: Cognitive deficits;Severity of deficits      Swallow Study   General HPI: 75 year old female with PMH of GERD, Dementia, Type 2 DM, COPD, HTN, recurrent  PNA and Hyperlipidemia. Pt had recent admission for PNA from 10/22-10/26 and presented for this admission with PNA. Intubated on 11/20 and extubated on 11/26. CXR show increasing left basilar infiltrate.  Type of Study: Bedside Swallow Evaluation Previous Swallow Assessment: BSE (02/06/16) recs Dys 1 and thin Diet Prior to this Study: NPO Temperature Spikes Noted: No Respiratory Status: Room air History of Recent Intubation: Yes Length of  Intubations (days): 6 days Date extubated: 03/11/16 Behavior/Cognition: Alert;Cooperative;Requires cueing Oral Cavity Assessment: Within Functional Limits Oral Care Completed by SLP: No Oral Cavity - Dentition: Adequate natural dentition Self-Feeding Abilities: Total assist Patient Positioning: Upright in bed Baseline Vocal Quality: Low vocal intensity Volitional Cough: Weak    Oral/Motor/Sensory Function Overall Oral Motor/Sensory Function: Within functional limits   Ice Chips Ice chips: Impaired Presentation: Spoon Pharyngeal Phase Impairments: Throat Clearing - Immediate   Thin Liquid Thin Liquid: Impaired Presentation: Spoon Pharyngeal  Phase Impairments: Throat Clearing - Immediate    Nectar Thick Nectar Thick Liquid: Not tested   Honey Thick Honey Thick Liquid: Not tested   Puree Puree: Within functional limits Presentation: Spoon   Solid   GO   Solid: Not tested       Tollie EthHaleigh Ragan Kyzer Blowe, Student SLP  Caryl NeverHaleigh R Krishna Dancel 03/12/2016,10:55 AM

## 2016-03-12 NOTE — [place] FL2 (Signed)
[place] MEDICAID FL2 LEVEL OF CARE SCREENING TOOL     IDENTIFICATION  Patient Name: Sheila Cisneros Birthdate: February 01, 1941 Sex: female Admission Date (Current Location): 03/05/2016  [facility] [place]   Facility and Address:  [facility], [address], [place], [place] [address]      Provider Number: [id number]  Attending Physician Name and Address:  Louann SjogrenJose Angelo A de Dios, MD  Relative Name and Phone Number:  Lorin PicketScott [facility](Nephew) [phone]    Current Level of Care: Hospital Recommended Level of Care: Skilled Nursing Facility Prior Approval Number:    Date Approved/Denied:   PASRR Number: [id number]  Discharge Plan: SNF    Current Diagnoses: Patient Active Problem List   Diagnosis Date Noted  . Aspiration pneumonia (HCC) 03/05/2016  . Pressure injury of skin 03/05/2016  . Acute respiratory failure with hypoxia (HCC)   . AKI (acute kidney injury) (HCC)   . Torticollis 02/28/2016  . Encephalopathy, metabolic 02/07/2016  . HCAP (healthcare-associated pneumonia) 02/05/2016  . Bronchiectasis (HCC) 06/18/2015  . Insomnia due to anxiety and fear 06/09/2015  . Hearing loss 05/02/2015  . Mass of lower lobe of left lung 04/06/2015  . COPD (chronic obstructive pulmonary disease) (HCC) 03/23/2015  . Other screening mammogram 08/04/2013  . Duodenal ulcer 08/03/2013  . Hypokalemia 08/03/2013  . Hyperlipidemia with target LDL less than 100   . Type II diabetes mellitus with manifestations (HCC) 06/21/2013  . Scoliosis (and kyphoscoliosis), idiopathic 12/12/2012  . Vascular dementia 12/10/2012  . GERD (gastroesophageal reflux disease) 12/09/2012  . GOUT 03/07/2007  . Essential hypertension 03/07/2007    Orientation RESPIRATION BLADDER Height & Weight     Self  Normal Continent, Indwelling catheter Weight: 103 lb 13.4 oz (47.1 kg) Height:  5\' 2"  (157.5 cm)  BEHAVIORAL SYMPTOMS/MOOD NEUROLOGICAL BOWEL NUTRITION STATUS   (NONE)   (NONE) Continent  (pending swallow evaluation )  AMBULATORY STATUS COMMUNICATION OF NEEDS Skin   Extensive Assist Verbally PU Stage and Appropriate Care   PU Stage 2 Dressing:  (Location: medial buttocks; Foam Dressing; Change PRN)                   Personal Care Assistance Level of Assistance  Bathing, Feeding, Dressing Bathing Assistance: Maximum assistance Feeding assistance: Limited assistance Dressing Assistance: Maximum assistance     Functional Limitations Info  Sight, Hearing, Speech Sight Info: Adequate Hearing Info: Adequate Speech Info: Adequate    SPECIAL CARE FACTORS FREQUENCY  PT (By licensed PT), OT (By licensed OT)     PT Frequency: min 3x/week OT Frequency: min 3x/week            Contractures Contractures Info: Not present    Additional Factors Info  Code Status, Insulin Sliding Scale, Allergies, Isolation Precautions Code Status Info: FULL Allergies Info: Codeine; Neomycin; Penicillins; Tetanus Toxoids; Diphtheria Toxoid- containing vaccines; Levofloxacin   Insulin Sliding Scale Info: novoLOG 1-3 units every 4 hrs Isolation Precautions Info: contact precautions     Current Medications (03/12/2016):  This is the current hospital active medication list Current Facility-Administered Medications  Medication Dose Route Frequency Provider Last Rate Last Dose  . 0.9 %  sodium chloride infusion  250 mL Intravenous PRN Tobey GrimKatalina M Eubanks, NP   Stopped at 03/11/16 1600  . budesonide (PULMICORT) nebulizer solution 0.5 mg  0.5 mg Nebulization BID Jose Alexis FrockAngelo A de Dios, MD   0.5 mg at 03/12/16 0834  . dextrose 5 % and 0.45% NaCl 1,000 mL  infusion   Intravenous Continuous Almon Herculesaye T Gonfa, MD 50 mL/hr at 03/12/16 0900    . feeding supplement (VITAL AF 1.2 CAL) liquid 1,000 mL  1,000 mL Per Tube Continuous Jose Alexis FrockAngelo A de Dios, MD   Stopped at 03/11/16 [phone]  . fentaNYL (SUBLIMAZE) injection 50 mcg  50 mcg Intravenous Q2H PRN Melene Planan Floyd, DO   50 mcg at 03/09/16 2013   . heparin injection 5,000 Units  5,000 Units Subcutaneous Q8H Tobey GrimKatalina M Eubanks, NP   5,000 Units at 03/12/16 0524  . insulin aspart (novoLOG) injection 1-3 Units  1-3 Units Subcutaneous Q4H Almon Herculesaye T Gonfa, MD      . ipratropium-albuterol (DUONEB) 0.5-2.5 (3) MG/3ML nebulizer solution 3 mL  3 mL Nebulization QID Jose Alexis FrockAngelo A de Dios, MD   3 mL at 03/12/16 1150  . MEDLINE mouth rinse  15 mL Mouth Rinse BID Jose Alexis FrockAngelo A de Dios, MD   15 mL at 03/12/16 [phone]  . midazolam (VERSED) injection 1 mg  1 mg Intravenous Q15 min PRN Melene Planan Floyd, DO   1 mg at 03/05/16 1234  . midazolam (VERSED) injection 1 mg  1 mg Intravenous Q2H PRN Melene Planan Floyd, DO      . vancomycin (VANCOCIN) IVPB 750 mg/150 ml premix  750 mg Intravenous Q24H Almon Herculesaye T Gonfa, MD   750 mg at 03/12/16 1158  . venlafaxine (EFFEXOR) tablet 37.5 mg  37.5 mg Per Tube Daily Jose Alexis FrockAngelo A de Dios, MD   37.5 mg at 03/11/16 [id number]     Discharge Medications: Please see discharge summary for a list of discharge medications.  Relevant Imaging Results:  Relevant Lab Results:   Additional Information SSN: [id number]  Rockwell GermanyAshley N Gardner, LCSW

## 2016-03-13 DIAGNOSIS — J9621 Acute and chronic respiratory failure with hypoxia: Secondary | ICD-10-CM

## 2016-03-13 DIAGNOSIS — R1312 Dysphagia, oropharyngeal phase: Secondary | ICD-10-CM

## 2016-03-13 DIAGNOSIS — J431 Panlobular emphysema: Secondary | ICD-10-CM

## 2016-03-13 DIAGNOSIS — J15212 Pneumonia due to Methicillin resistant Staphylococcus aureus: Secondary | ICD-10-CM

## 2016-03-13 DIAGNOSIS — N179 Acute kidney failure, unspecified: Secondary | ICD-10-CM

## 2016-03-13 DIAGNOSIS — I5032 Chronic diastolic (congestive) heart failure: Secondary | ICD-10-CM

## 2016-03-13 DIAGNOSIS — J810 Acute pulmonary edema: Secondary | ICD-10-CM

## 2016-03-13 LAB — BASIC METABOLIC PANEL
Anion gap: 13 (ref 5–15)
BUN: 9 mg/dL (ref 6–20)
CHLORIDE: 100 mmol/L — AB (ref 101–111)
CO2: 25 mmol/L (ref 22–32)
Calcium: 10.3 mg/dL (ref 8.9–10.3)
Creatinine, Ser: 0.77 mg/dL (ref 0.44–1.00)
Glucose, Bld: 137 mg/dL — ABNORMAL HIGH (ref 65–99)
POTASSIUM: 3.4 mmol/L — AB (ref 3.5–5.1)
SODIUM: 138 mmol/L (ref 135–145)

## 2016-03-13 LAB — MAGNESIUM: Magnesium: 1.9 mg/dL (ref 1.7–2.4)

## 2016-03-13 LAB — CBC
HEMATOCRIT: 42.4 % (ref 36.0–46.0)
Hemoglobin: 12.6 g/dL (ref 12.0–15.0)
MCH: 26.8 pg (ref 26.0–34.0)
MCHC: 31.1 g/dL (ref 30.0–36.0)
MCV: 86.2 fL (ref 78.0–100.0)
PLATELETS: 341 10*3/uL (ref 150–400)
RBC: 4.92 MIL/uL (ref 3.87–5.11)
RDW: 16.3 % — AB (ref 11.5–15.5)
WBC: 10.5 10*3/uL (ref 4.0–10.5)

## 2016-03-13 LAB — PHOSPHORUS: PHOSPHORUS: 3.4 mg/dL (ref 2.5–4.6)

## 2016-03-13 LAB — GLUCOSE, CAPILLARY
GLUCOSE-CAPILLARY: 127 mg/dL — AB (ref 65–99)
GLUCOSE-CAPILLARY: 115 mg/dL — AB (ref 65–99)
Glucose-Capillary: 153 mg/dL — ABNORMAL HIGH (ref 65–99)

## 2016-03-13 MED ORDER — SODIUM CHLORIDE 0.9 % IV SOLN
30.0000 meq | Freq: Once | INTRAVENOUS | Status: AC
  Administered 2016-03-14: 30 meq via INTRAVENOUS
  Filled 2016-03-13: qty 15

## 2016-03-13 MED ORDER — INSULIN ASPART 100 UNIT/ML ~~LOC~~ SOLN
0.0000 [IU] | SUBCUTANEOUS | Status: DC
  Administered 2016-03-14: 1 [IU] via SUBCUTANEOUS
  Administered 2016-03-14 (×2): 2 [IU] via SUBCUTANEOUS
  Administered 2016-03-15 (×3): 1 [IU] via SUBCUTANEOUS
  Administered 2016-03-15: 2 [IU] via SUBCUTANEOUS
  Administered 2016-03-16: 1 [IU] via SUBCUTANEOUS
  Administered 2016-03-17: 2 [IU] via SUBCUTANEOUS
  Administered 2016-03-17 – 2016-03-18 (×5): 1 [IU] via SUBCUTANEOUS
  Administered 2016-03-19: 2 [IU] via SUBCUTANEOUS
  Administered 2016-03-19 – 2016-03-20 (×2): 1 [IU] via SUBCUTANEOUS
  Administered 2016-03-20: 2 [IU] via SUBCUTANEOUS
  Administered 2016-03-21 (×3): 1 [IU] via SUBCUTANEOUS

## 2016-03-13 MED ORDER — VENLAFAXINE HCL 37.5 MG PO TABS
37.5000 mg | ORAL_TABLET | Freq: Every day | ORAL | Status: DC
  Administered 2016-03-20 – 2016-03-21 (×2): 37.5 mg via ORAL
  Filled 2016-03-13 (×9): qty 1

## 2016-03-13 NOTE — Consult Note (Signed)
   [facility]   03/13/2016  Sheila Cisneros 02-18-1941 [id number]   Patient screened for potential [facility] Care Management services for re-admission and pneumonia. [facility] under patient's [employer] Medicare plan.  Chart review reveals the patient is a 75 year old female with PMH of GERD, Dementia, Type 2 DM, COPD, HTN, and Hyperlipidemia [place] [facility]. At baseline patient is non-verbal and has had multiple aspiration events. Yesterday, due to patient clinical status and recent admission for PNA from 10/22-10/26, a CXR was obtained which showed PNA. Patient did require intubation at this admission. Patient's family is trying to decide on LT skilled nursing.  Patient has been out reached by [facility].  Will follow as appropriate. For questions contact:   Charlesetta ShanksVictoria Addilee Neu, RN BSN CCM [facility] Liaison  [phone] business mobile phone Toll free office [phone]

## 2016-03-13 NOTE — Progress Notes (Signed)
PROGRESS NOTE    Sheila Cisneros  ZOX:[id number] DOB: [birth year] DOA: 03/05/2016 PCP: Sanda Lingerhomas Jones, MD   Brief Narrative:  75 year old BF PMHx Dementia, GERD, DM Type 2 uncontrolled with complications, COPD, HTN, HLD, Chronic lower back pain,Hepatitis, unspecified,Gout, unspecified  Presents to the ED on 11/20 from [facility]. At baseline patient is non-verbal and has had multiple aspiration events. Yesterday, due to patient clinical status and recent admission for PNA from 10/22-10/26, a CXR was obtained which showed PNA. This morning patient was febrile and more altered then typical at facility, so was sent to ED. Upon arrival patient was alert on 2L [place], during stay in ED patient had an aspiration event where oxygenation was 78%, post-suctioning patient remained minimally responsive and hypotension - requiring intubation. PCCM called to admit.     Subjective: 11/28 A/O 1 (does not know where, when, why). Answer simple yes and no questions such as are you in pain, do you have shortness of breath.   Assessment & Plan:   Active Problems:   Aspiration pneumonia (HCC)   Acute respiratory failure with hypoxia (HCC)   AKI (acute kidney injury) (HCC)   Pressure injury of skin   Acute on Chronic Respiratory Failure with hypoxia/ positive, MRSA in sputum/Aspiration PNA -secondary to Aspiration (history of multiple aspiration events), MRSA in sputum -Recent Admission for PNA (10/22-10/26)  -Patient expected to continue to have recurrent aspiration pneumonia. Palliative care consult placed address CODE STATUS, short-term vs long-term goals of care: Hospice?    COPD, Bronchiectasis -Bibasilar opacities improved. -Extubated 11/26 to [place]  -Completed course antibiotics -Pulmicort 0.5 mg twice a day  -Duonebs q6h   Pulm edema improving.  -Resolved  S/P hypotension 2/2 hypovoplemia and sepsis -Resolved  Chronic diastolic CHF -Strict in and out -Daily weight  HTN,Echo 11/21 EF 55-60%  G1DD  Hypokalemia -Potassium goal> 4 -Potassium 30 mEq  HLD  AKI  -2/2 hypovolemia.  Lab Results  Component Value Date   CREATININE 0.77 03/13/2016   CREATININE 0.92 03/12/2016   CREATININE 0.97 03/11/2016  -Resolved  Dysphagia (Multiple aspiration events)  -11/28 failed swallow study. Family will need to decide on placement of PEG tube vs Hospice  DM Type 2 controlled with complications -Sensitive SSI  Dementia/Encephalopathy secondary to PNA;  -Baseline?   Goals of care -Palliative care consult pending  DVT prophylaxis: Heparin Code Status: Full Family Communication: None Disposition Plan:    Consultants:  Palliative care pending    Procedures/Significant Events:  11/20 CXR > Bibasilar opacities, increasing on the right since prior study concerning for pneumonia  11/21 CXR >Bibasilar opacities, hardware in right place  11/22 CXR with improved bibasilar opacities, hardware in right place  11/24 CXR  Improved LLL and RML infiltrates compared to prior. Hardware in the right place.    10/22-10/26: Admission for HCAP  11/20 Admitted for acute hypoxic respiratory failure likely from aspiration 11/23 Drowsy and Tachypneic for SBT  VENTILATOR SETTINGS: None   Cultures Blood 11/20>>NGTD Urine 11/20>>NGTD Sputum 11/22>>fwe MRSA MRSA PCR positive  Antimicrobials: Unasyn 11/24>> 11/25 Cefepime 11/21>> 11/24 Vancomycin 11/201 dose: 11/25>> 11/29   Devices None   LINES / TUBES:  11/20 3 PIV's 11/20 ETT>>11/26 11/20 OGT>>11/26    Continuous Infusions: . dextrose 5 % and 0.45% NaCl 1,000 mL infusion 50 mL/hr at 03/13/16 0628     Objective: Vitals:   03/12/16 2316 03/13/16 0623 03/13/16 0750 03/13/16 0806  BP: (!) 172/86 (!) 170/104 (!) 149/68   Pulse: 81 84 80  Resp: 17 20    Temp: 98.4 F (36.9 C) 97.5 F (36.4 C)    TempSrc: Oral Oral    SpO2: 100% 99%    Weight:    48.2 kg (106 lb 3.2 oz)  Height:         Intake/Output Summary (Last 24 hours) at 03/13/16 0816 Last data filed at 03/13/16 0700  Gross per 24 hour  Intake             1250 ml  Output              450 ml  Net              800 ml   Filed Weights   03/11/16 0455 03/12/16 0500 03/13/16 0806  Weight: 49.9 kg (110 lb 0.2 oz) 47.1 kg (103 lb 13.4 oz) 48.2 kg (106 lb 3.2 oz)    Examination:  General: A/O 1 (does not know where, when, why), cooperates with exam, cachectic No acute respiratory distress Eyes: negative scleral hemorrhage, negative anisocoria, negative icterus ENT: Negative Runny nose, negative gingival bleeding, Neck:  Negative scars, masses, torticollis, lymphadenopathy, JVD Lungs: Clear to auscultation bilaterally without wheezes or crackles Cardiovascular: Regular rate and rhythm without murmur gallop or rub normal S1 and S2 Abdomen: negative abdominal pain, nondistended, positive soft, bowel sounds, no rebound, no ascites, no appreciable mass Extremities: No significant cyanosis, clubbing, or edema bilateral lower extremities Skin: Negative rashes, lesions, ulcers Psychiatric:  Negative depression, negative anxiety, negative fatigue, negative mania  Central nervous system:  Cranial nerves II through XII intact, tongue/uvula midline, all extremities muscle strength 5/5, sensation intact throughout, negative dysarthria, negative expressive aphasia, negative receptive aphasia.  .     Data Reviewed: Care during the described time interval was provided by me .  I have reviewed this patient's available data, including medical history, events of note, physical examination, and all test results as part of my evaluation. I have personally reviewed and interpreted all radiology studies.  CBC:  Recent Labs Lab 03/09/16 0227 03/10/16 0452 03/11/16 0512 03/12/16 0226 03/13/16 0619  WBC 14.0* 13.2* 12.6* 10.9* 10.5  HGB 8.9* 8.9* 8.7* 9.0* 12.6  HCT 28.5* 28.3* 27.8* 29.0* 42.4  MCV 87.4 86.8 87.1 87.6 86.2   PLT 258 296 345 398 341   Basic Metabolic Panel:  Recent Labs Lab 03/09/16 0227 03/10/16 0452 03/11/16 0245 03/12/16 0226 03/13/16 0619  NA 145 141 142 140 138  K 3.3* 4.2 3.7 3.4* 3.4*  CL 111 109 108 105 100*  CO2 25 24 23 25 25   GLUCOSE 122* 185* 51* 120* 137*  BUN 26* 21* 20 15 9   CREATININE 1.09* 1.01* 0.97 0.92 0.77  CALCIUM 9.0 9.0 9.1 9.3 10.3  MG 1.6* 2.0 1.9 1.6* 1.9  PHOS 3.2 3.2 4.1 4.3 3.4   GFR: Estimated Creatinine Clearance: 46.9 mL/min (by C-G formula based on SCr of 0.77 mg/dL). Liver Function Tests: No results for input(s): AST, ALT, ALKPHOS, BILITOT, PROT, ALBUMIN in the last 168 hours. No results for input(s): LIPASE, AMYLASE in the last 168 hours. No results for input(s): AMMONIA in the last 168 hours. Coagulation Profile: No results for input(s): INR, PROTIME in the last 168 hours. Cardiac Enzymes: No results for input(s): CKTOTAL, CKMB, CKMBINDEX, TROPONINI in the last 168 hours. BNP (last 3 results) No results for input(s): PROBNP in the last 8760 hours. HbA1C: No results for input(s): HGBA1C in the last 72 hours. CBG:  Recent Labs Lab 03/12/16 0743 03/12/16  1209 03/12/16 1702 03/12/16 2316 03/13/16 0805  GLUCAP 107* 120* 110* 130* 153*   Lipid Profile: No results for input(s): CHOL, HDL, LDLCALC, TRIG, CHOLHDL, LDLDIRECT in the last 72 hours. Thyroid Function Tests: No results for input(s): TSH, T4TOTAL, FREET4, T3FREE, THYROIDAB in the last 72 hours. Anemia Panel: No results for input(s): VITAMINB12, FOLATE, FERRITIN, TIBC, IRON, RETICCTPCT in the last 72 hours. Urine analysis:    Component Value Date/Time   COLORURINE YELLOW 03/05/2016 1030   APPEARANCEUR CLEAR 03/05/2016 1030   LABSPEC 1.023 03/05/2016 1030   PHURINE 6.0 03/05/2016 1030   GLUCOSEU NEGATIVE 03/05/2016 1030   GLUCOSEU NEGATIVE 03/23/2015 1432   HGBUR NEGATIVE 03/05/2016 1030   BILIRUBINUR NEGATIVE 03/05/2016 1030   KETONESUR NEGATIVE 03/05/2016 1030    PROTEINUR NEGATIVE 03/05/2016 1030   UROBILINOGEN 0.2 03/23/2015 1432   NITRITE NEGATIVE 03/05/2016 1030   LEUKOCYTESUR NEGATIVE 03/05/2016 1030   Sepsis Labs: @LABRCNTIP (procalcitonin:4,lacticidven:4)  ) Recent Results (from the past 240 hour(s))  Culture, blood (Routine x 2)     Status: None   Collection Time: 03/05/16 10:20 AM  Result Value Ref Range Status   Specimen Description BLOOD RIGHT FOREARM  Final   Special Requests BOTTLES DRAWN AEROBIC AND ANAEROBIC 5CC  Final   Culture NO GROWTH 5 DAYS  Final   Report Status 03/10/2016 FINAL  Final  Culture, blood (Routine x 2)     Status: None   Collection Time: 03/05/16 10:29 AM  Result Value Ref Range Status   Specimen Description BLOOD RIGHT HAND  Final   Special Requests BOTTLES DRAWN AEROBIC AND ANAEROBIC 5CC  Final   Culture NO GROWTH 5 DAYS  Final   Report Status 03/10/2016 FINAL  Final  Urine culture     Status: None   Collection Time: 03/05/16 10:30 AM  Result Value Ref Range Status   Specimen Description URINE, CATHETERIZED  Final   Special Requests NONE  Final   Culture NO GROWTH  Final   Report Status 03/06/2016 FINAL  Final  MRSA PCR Screening     Status: Abnormal   Collection Time: 03/05/16  2:47 PM  Result Value Ref Range Status   MRSA by PCR POSITIVE (A) NEGATIVE Final    Comment:        The GeneXpert MRSA Assay (FDA approved for NASAL specimens only), is one component of a comprehensive MRSA colonization surveillance program. It is not intended to diagnose MRSA infection nor to guide or monitor treatment for MRSA infections. RESULT CALLED TO, READ BACK BY AND VERIFIED WITH: Mirian Capuchin RN 16:45 03/05/16 ([email or web address])   Culture, respiratory (NON-Expectorated)     Status: None   Collection Time: 03/07/16 11:45 AM  Result Value Ref Range Status   Specimen Description TRACHEAL ASPIRATE  Final   Special Requests NONE  Final   Gram Stain   Final    ABUNDANT WBC PRESENT, PREDOMINANTLY PMN RARE YEAST     Culture FEW METHICILLIN RESISTANT STAPHYLOCOCCUS AUREUS  Final   Report Status 03/10/2016 FINAL  Final   Organism ID, Bacteria METHICILLIN RESISTANT STAPHYLOCOCCUS AUREUS  Final      Susceptibility   Methicillin resistant staphylococcus aureus - MIC*    CIPROFLOXACIN >=8 RESISTANT Resistant     ERYTHROMYCIN >=8 RESISTANT Resistant     GENTAMICIN <=0.5 SENSITIVE Sensitive     OXACILLIN >=4 RESISTANT Resistant     TETRACYCLINE >=16 RESISTANT Resistant     VANCOMYCIN 1 SENSITIVE Sensitive     TRIMETH/SULFA <=10 SENSITIVE Sensitive  CLINDAMYCIN >=8 RESISTANT Resistant     RIFAMPIN <=0.5 SENSITIVE Sensitive     Inducible Clindamycin NEGATIVE Sensitive     * FEW METHICILLIN RESISTANT STAPHYLOCOCCUS AUREUS         Radiology Studies: Dg Swallowing Func-speech Pathology  Result Date: 03/12/2016 Objective Swallowing Evaluation: Type of Study: MBS-Modified Barium Swallow Study Patient Details Name: Sheila Cisneros MRN: [id number] Date of Birth: 01/19/1941 Today's Date: 03/12/2016 Time: SLP Start Time (ACUTE ONLY): 1056-SLP Stop Time (ACUTE ONLY): 1120 SLP Time Calculation (min) (ACUTE ONLY): 24 min Past Medical History: Past Medical History: Diagnosis Date . Altered mental status 12/09/2012 . Arthritis   "all over" (12/09/2012) . Chronic bronchitis (HCC)   "couple times/yr" (12/09/2012) . Chronic lower back pain  . COPD (chronic obstructive pulmonary disease) (HCC)  . Diabetes mellitus without complication (HCC)  . Exertional shortness of breath  . GERD (gastroesophageal reflux disease)   rolaids if needed . Gout, unspecified  . Hepatitis, unspecified   "the one that's not bad" (12/09/2012) . History of blood transfusion   "related to a surgery, I think" (12/09/2012) . Hyperlipidemia  . Hypertension  . Migraines   "in the past" (12/09/2012) . Osteoarthrosis, unspecified whether generalized or localized, unspecified site  . Other chronic sinusitis  . Pneumonia   "couple times; long time ago" (12/09/2012) .  Unspecified chronic bronchitis   sees Dr. Melba Coon. Young, last visit 08/2011, treated for E. Coli- resp. /w Ceftin, Feb. 2013 Past Surgical History: Past Surgical History: Procedure Laterality Date . ABDOMINAL HYSTERECTOMY    "w/right ovary" (12/09/2012) . APPENDECTOMY   . BREAST BIOPSY Right  . CARPAL TUNNEL RELEASE  09/06/2011  Procedure: CARPAL TUNNEL RELEASE;  Surgeon: Kennieth RadArthur F Carter, MD;  [facility];  Service: Orthopedics;  Laterality: Right; . ESOPHAGOGASTRODUODENOSCOPY N/A 06/04/2013  Procedure: ESOPHAGOGASTRODUODENOSCOPY (EGD);  Surgeon: Graylin ShiverSalem F Ganem, MD;  [facility];  Service: Endoscopy;  Laterality: N/A; . SHOULDER ARTHROSCOPY W/ ROTATOR CUFF REPAIR Right  . TONSILLECTOMY   . TUBAL LIGATION   HPI: 75 year old female with PMH of GERD, Dementia, Type 2 DM, COPD, HTN, recurrent PNA and Hyperlipidemia. Pt had recent admission for PNA from 10/22-10/26 and presented for this admission with PNA. Intubated on 11/20 and extubated on 11/26. CXR show increasing left basilar infiltrate.  Subjective: pt alert, participatory Assessment / Plan / Recommendation CHL IP CLINICAL IMPRESSIONS 03/12/2016 Therapy Diagnosis Severe pharyngeal phase dysphagia;Mild oral phase dysphagia Clinical Impression Pt presents with mild oral and severe pharyngeal dysphagia. She had increasing lingual residue as PO trials were given. Oral holding was displayed during trials of pureed solids and Mod cues were needed to initiate a swallow; however, pt's airway remained protected. Pt presented with a delay in swallow initiation to the valleculae, reduced airway closure and pharyngeal peristalsis which resulted in events of silent penetration/aspiration with thin and nectar thick liquid trials. Despite Mod cueing, pt was unable to perform compensatory startegies to aid in airway protection (i.e. chin tuck, cued cough). Vallecular residue was observed throughout MBS that was minimized when pt spontaneously had additional swallows. Pt had  a coordinated and timely swallow with honey thick liquids but vallecular residuals were silently penetrated and ultimately aspirated following the swallow. Although pt had reduced sensation she did reflexively cough following aspiration of honey thick liquids. Recommend pt remain NPO except for medications crushed in puree because of the increased time and cueing needed to eliminate oral holding observed with puree consistencies. Per chart review, pt's recent barium swallow (02/09/16)  showed no events of aspiration, thus her performance on MBS may likely be due to her prolonged intubation which means there is potential for improvement over the next few days. Will continue to follow acutely and administer PO trials at bedside. Impact on safety and function Severe aspiration risk   CHL IP TREATMENT RECOMMENDATION 03/12/2016 Treatment Recommendations Therapy as outlined in treatment plan below   Prognosis 03/12/2016 Prognosis for Safe Diet Advancement Good Barriers to Reach Goals Cognitive deficits;Severity of deficits Barriers/Prognosis Comment -- CHL IP DIET RECOMMENDATION 03/12/2016 SLP Diet Recommendations NPO except meds Liquid Administration via -- Medication Administration Crushed with puree Compensations -- Postural Changes Seated upright at 90 degrees   CHL IP OTHER RECOMMENDATIONS 03/12/2016 Recommended Consults -- Oral Care Recommendations Oral care QID Other Recommendations --   CHL IP FOLLOW UP RECOMMENDATIONS 03/12/2016 Follow up Recommendations Other (comment)   CHL IP FREQUENCY AND DURATION 03/12/2016 Speech Therapy Frequency (ACUTE ONLY) min 2x/week Treatment Duration 2 weeks      CHL IP ORAL PHASE 03/12/2016 Oral Phase Impaired Oral - Pudding Teaspoon -- Oral - Pudding Cup -- Oral - Honey Teaspoon -- Oral - Honey Cup Lingual/palatal residue;Delayed oral transit Oral - Nectar Teaspoon -- Oral - Nectar Cup Lingual/palatal residue Oral - Nectar Straw Lingual/palatal residue Oral - Thin Teaspoon  Lingual/palatal residue Oral - Thin Cup Lingual/palatal residue Oral - Thin Straw -- Oral - Puree Lingual/palatal residue;Delayed oral transit;Holding of bolus Oral - Mech Soft -- Oral - Regular -- Oral - Multi-Consistency -- Oral - Pill -- Oral Phase - Comment --  CHL IP PHARYNGEAL PHASE 03/12/2016 Pharyngeal Phase Impaired Pharyngeal- Pudding Teaspoon -- Pharyngeal -- Pharyngeal- Pudding Cup -- Pharyngeal -- Pharyngeal- Honey Teaspoon -- Pharyngeal -- Pharyngeal- Honey Cup Delayed swallow initiation-vallecula;Pharyngeal residue - valleculae;Reduced airway/laryngeal closure;Reduced pharyngeal peristalsis;Penetration/Apiration after swallow Pharyngeal Material enters airway, remains ABOVE vocal cords and not ejected out;Material enters airway, passes BELOW cords without attempt by patient to eject out (silent aspiration) Pharyngeal- Nectar Teaspoon -- Pharyngeal -- Pharyngeal- Nectar Cup Delayed swallow initiation-vallecula;Reduced airway/laryngeal closure;Penetration/Aspiration before swallow;Pharyngeal residue - valleculae;Reduced pharyngeal peristalsis Pharyngeal Material enters airway, remains ABOVE vocal cords and not ejected out Pharyngeal- Nectar Straw Delayed swallow initiation-vallecula;Penetration/Aspiration before swallow;Reduced pharyngeal peristalsis;Reduced airway/laryngeal closure;Pharyngeal residue - valleculae;Compensatory strategies attempted (with notebox) Pharyngeal Material enters airway, remains ABOVE vocal cords and not ejected out;Material enters airway, passes BELOW cords without attempt by patient to eject out (silent aspiration) Pharyngeal- Thin Teaspoon Delayed swallow initiation-vallecula;Reduced airway/laryngeal closure;Reduced pharyngeal peristalsis;Pharyngeal residue - valleculae;Penetration/Aspiration before swallow Pharyngeal Material enters airway, remains ABOVE vocal cords then ejected out Pharyngeal- Thin Cup Delayed swallow initiation-vallecula;Reduced pharyngeal  peristalsis;Reduced airway/laryngeal closure;Penetration/Aspiration before swallow;Pharyngeal residue - valleculae Pharyngeal Material enters airway, passes BELOW cords without attempt by patient to eject out (silent aspiration) Pharyngeal- Thin Straw -- Pharyngeal -- Pharyngeal- Puree Delayed swallow initiation-vallecula;Reduced pharyngeal peristalsis;Reduced airway/laryngeal closure Pharyngeal -- Pharyngeal- Mechanical Soft -- Pharyngeal -- Pharyngeal- Regular -- Pharyngeal -- Pharyngeal- Multi-consistency -- Pharyngeal -- Pharyngeal- Pill -- Pharyngeal -- Pharyngeal Comment --  CHL IP CERVICAL ESOPHAGEAL PHASE 03/12/2016 Cervical Esophageal Phase WFL Pudding Teaspoon -- Pudding Cup -- Honey Teaspoon -- Honey Cup -- Nectar Teaspoon -- Nectar Cup -- Nectar Straw -- Thin Teaspoon -- Thin Cup -- Thin Straw -- Puree -- Mechanical Soft -- Regular -- Multi-consistency -- Pill -- Cervical Esophageal Comment -- No flowsheet data found. Maxcine Ham 03/12/2016, 3:17 PM  Note populated for Jearl Klinefelter, student SLP Maxcine Ham, M.A. CCC-SLP [phone]  Scheduled Meds: . budesonide (PULMICORT) nebulizer solution  0.5 mg Nebulization BID  . heparin  5,000 Units Subcutaneous Q8H  . insulin aspart  0-9 Units Subcutaneous TID WC  . ipratropium-albuterol  3 mL Nebulization QID  . mouth rinse  15 mL Mouth Rinse BID  . vancomycin  750 mg Intravenous Q24H  . venlafaxine  37.5 mg Oral Q breakfast   Continuous Infusions: . dextrose 5 % and 0.45% NaCl 1,000 mL infusion 50 mL/hr at 03/13/16 0628     LOS: 8 days    Time spent: 40 minutes    Inman Fettig, Roselind Messier, MD [facility] Pager [phone]   If 7PM-7AM, please contact night-coverage [email or web address] Password [email or web address] 03/13/2016, 8:16 AM

## 2016-03-13 NOTE — Progress Notes (Signed)
Pharmacy Antibiotic Note  Sheila Cisneros is a 75 y.o. female admitted on 03/05/2016 with pneumonia.  Pharmacy has been consulted for Vancomycin dosing.   Day # 9 of 10 antibiotics for MRSA pneumonia.    Pt previously on Vancomycin and Cefepime. Narrowed to Unasyn 11/24. Switched by to Vanc on 11/25 due to trach aspirate culture positive for MRSA.  Plan:  Continue Vancomycin 750 mg IV q24hrs.  Stop date in place for after 11/29 dose.  Height: 5\' 2"  (157.5 cm) Weight: 106 lb 3.2 oz (48.2 kg) IBW/kg (Calculated) : 50.1  Temp (24hrs), Avg:98.4 F (36.9 C), Min:97.5 F (36.4 C), Max:99.3 F (37.4 C)   Recent Labs Lab 03/09/16 0227 03/10/16 0452 03/11/16 0245 03/11/16 0512 03/12/16 0226 03/13/16 0619  WBC 14.0* 13.2*  --  12.6* 10.9* 10.5  CREATININE 1.09* 1.01* 0.97  --  0.92 0.77    Estimated Creatinine Clearance: 46.9 mL/min (by C-G formula based on SCr of 0.77 mg/dL).    Allergies  Allergen Reactions  . Codeine Rash and Other (See Comments)     angioedema  . Neomycin Itching  . Penicillins Itching and Rash       . Tetanus Toxoids Rash  . Diphtheria Toxoid-Containing Vaccines Other (See Comments)  . Levofloxacin Rash    Antimicrobials this admission: Cefepime 11/20 >> 11/24 Vanc 11/20 >> 11/24, 11/25>> [11/29] Unasyn 11/24 >>11/25  Dose adjustments this admission:  n/a  Microbiology results: 11/20 Blood x 2 - negative 11/20 Urine - negative 11/20 MRSA PCR: pos -> mupirocin/CHG 11/21>>11/25 11/22 Tracheal aspirate - few MRSA, MIC to Vanc = 1 11/23 Sputum: yeast - likely contaminant   Thank you for allowing pharmacy to be a part of this patient's care.  Dennie Fettersgan, Jandy Brackens [place], [place] Pager: [phone] 03/13/2016 10:52 AM

## 2016-03-13 NOTE — Progress Notes (Signed)
Speech Language Pathology Treatment: Dysphagia  Patient Details Name: Sheila Cisneros MRN: [id number] DOB: Aug 26, 1940 Today's Date: 03/13/2016 Time: [id number] SLP Time Calculation (min) (ACUTE ONLY): 14 min  Assessment / Plan / Recommendation Clinical Impression  Provided therapeutic intervention to aid in initiation of swallow trigger. Repositioned pt, cleaned face and mouth for better responsiveness. Pt flat, but did verbally respond to questions with a loud clear voice. Administered 1/2 tsp trials of thin liquids with relatively timely swallow observed and reflexive second swallow over 3 trials. Puree also attempted with increase in delay and cueing needed for second swallows. Attempted to elicit throat clearing with verbal cueing without success. Will f/u for further trials and potential repeat testing as cognition improves.    HPI HPI: 75 year old female with PMH of GERD, Dementia, Type 2 DM, COPD, HTN, recurrent PNA and Hyperlipidemia. Pt had recent admission for PNA from 10/22-10/26 and presented for this admission with PNA. Intubated on 11/20 and extubated on 11/26. CXR show increasing left basilar infiltrate.       SLP Plan  Continue with current plan of care     Recommendations  Diet recommendations: NPO                Plan: Continue with current plan of care       GO                Sheila Cisneros, Riley NearingBonnie Cisneros 03/13/2016, 11:54 AM

## 2016-03-14 DIAGNOSIS — T17908A Unspecified foreign body in respiratory tract, part unspecified causing other injury, initial encounter: Secondary | ICD-10-CM

## 2016-03-14 DIAGNOSIS — F0390 Unspecified dementia without behavioral disturbance: Secondary | ICD-10-CM

## 2016-03-14 DIAGNOSIS — Z515 Encounter for palliative care: Secondary | ICD-10-CM

## 2016-03-14 DIAGNOSIS — Z7189 Other specified counseling: Secondary | ICD-10-CM

## 2016-03-14 LAB — BASIC METABOLIC PANEL
Anion gap: 11 (ref 5–15)
BUN: 9 mg/dL (ref 6–20)
CHLORIDE: 103 mmol/L (ref 101–111)
CO2: 22 mmol/L (ref 22–32)
CREATININE: 1.03 mg/dL — AB (ref 0.44–1.00)
Calcium: 9.7 mg/dL (ref 8.9–10.3)
GFR calc non Af Amer: 52 mL/min — ABNORMAL LOW (ref >60)
Glucose, Bld: 125 mg/dL — ABNORMAL HIGH (ref 65–99)
Potassium: 5.2 mmol/L — ABNORMAL HIGH (ref 3.5–5.1)
Sodium: 136 mmol/L (ref 135–145)

## 2016-03-14 LAB — CBC
HCT: 33.6 % — ABNORMAL LOW (ref 36.0–46.0)
Hemoglobin: 10.9 g/dL — ABNORMAL LOW (ref 12.0–15.0)
MCH: 28 pg (ref 26.0–34.0)
MCHC: 32.4 g/dL (ref 30.0–36.0)
MCV: 86.4 fL (ref 78.0–100.0)
PLATELETS: 458 10*3/uL — AB (ref 150–400)
RBC: 3.89 MIL/uL (ref 3.87–5.11)
RDW: 16.4 % — ABNORMAL HIGH (ref 11.5–15.5)
WBC: 13.5 10*3/uL — ABNORMAL HIGH (ref 4.0–10.5)

## 2016-03-14 LAB — GLUCOSE, CAPILLARY
GLUCOSE-CAPILLARY: 120 mg/dL — AB (ref 65–99)
Glucose-Capillary: 100 mg/dL — ABNORMAL HIGH (ref 65–99)
Glucose-Capillary: 111 mg/dL — ABNORMAL HIGH (ref 65–99)
Glucose-Capillary: 136 mg/dL — ABNORMAL HIGH (ref 65–99)
Glucose-Capillary: 151 mg/dL — ABNORMAL HIGH (ref 65–99)
Glucose-Capillary: 172 mg/dL — ABNORMAL HIGH (ref 65–99)

## 2016-03-14 LAB — MAGNESIUM: Magnesium: 1.7 mg/dL (ref 1.7–2.4)

## 2016-03-14 LAB — PHOSPHORUS: Phosphorus: 3.3 mg/dL (ref 2.5–4.6)

## 2016-03-14 MED ORDER — IPRATROPIUM-ALBUTEROL 0.5-2.5 (3) MG/3ML IN SOLN
3.0000 mL | Freq: Two times a day (BID) | RESPIRATORY_TRACT | Status: DC
  Administered 2016-03-14 – 2016-03-16 (×5): 3 mL via RESPIRATORY_TRACT
  Filled 2016-03-14: qty 9
  Filled 2016-03-14 (×4): qty 3

## 2016-03-14 MED ORDER — IPRATROPIUM-ALBUTEROL 0.5-2.5 (3) MG/3ML IN SOLN
3.0000 mL | Freq: Three times a day (TID) | RESPIRATORY_TRACT | Status: DC
  Administered 2016-03-14: 3 mL via RESPIRATORY_TRACT
  Filled 2016-03-14 (×2): qty 3

## 2016-03-14 MED ORDER — IPRATROPIUM-ALBUTEROL 0.5-2.5 (3) MG/3ML IN SOLN
3.0000 mL | Freq: Four times a day (QID) | RESPIRATORY_TRACT | Status: DC | PRN
  Administered 2016-03-17: 3 mL via RESPIRATORY_TRACT
  Filled 2016-03-14: qty 3

## 2016-03-14 NOTE — Care Management Note (Signed)
Case Management Note  Patient Details  Name: Sheila Cisneros MRN: [id number] Date of Birth: 26-Dec-1940  Subjective/Objective:   Admitted with aspiration PNA,   hx of GERD, Dementia, Type 2 DM, COPD, HTN. [facility]. Recently admitted for PNA from 10/22-10/26.              Tomasa BlaseJohn [name] (Brother)     [phone]      PCP:  Sanda Lingerhomas Jones  Action/Plan: Return to SNF when medically stable. CSW managing disposition to SNF.  Expected Discharge Date:                  Expected Discharge Plan:  Skilled Nursing Facility  In-House Referral:  Clinical Social Work  Discharge planning Services  CM Consult  Post Acute Care Choice:    Choice offered to:     DME Arranged:    DME Agency:     HH Arranged:    HH Agency:     Status of Service:  Completed, signed off  If discussed at [employer] Meetings, dates discussed:    Additional Comments:  Epifanio LeschesCole, Elenore Wanninger Hudson, RN 03/14/2016, 11:16 AM

## 2016-03-14 NOTE — Progress Notes (Signed)
Speech Language Pathology Treatment: Dysphagia  Patient Details Name: Sheila Cisneros MRN: [id number] DOB: 18-Jul-1940 Today's Date: 03/14/2016 Time: [id number] SLP Time Calculation (min) (ACUTE ONLY): 25 min  Assessment / Plan / Recommendation Clinical Impression  Skilled treatment session focused on dysphagia goals. Pt awake and alert to SLP's voice. Pt nonverbal throughout session but frequently made eye contact with SLP. Pt with saliva pooling in mouth. After oral care, pt given 1/2 tsp trials of thin liquids. Pt with delayed swallow initiation with immediate coughing with 3 trials. Pt appears to continue to be at high aspiration risk and therefore recommend she remain NPO. Education provided to nursing on recommendation. Will continue to f/u for further PO trials.    HPI HPI: 75 year old female with PMH of GERD, Dementia, Type 2 DM, COPD, HTN, recurrent PNA and Hyperlipidemia. Pt had recent admission for PNA from 10/22-10/26 and presented for this admission with PNA. Intubated on 11/20 and extubated on 11/26. CXR show increasing left basilar infiltrate.       SLP Plan  Continue with current plan of care     Recommendations  Diet recommendations: NPO Medication Administration: Via alternative means                Oral Care Recommendations: Oral care QID Follow up Recommendations:  (TBD) Plan: Continue with current plan of care       GO              Sheila Cisneros B. Dreama Saaverton, M.S., CCC-SLP Speech-Language Pathologist [phone]  Sheila Cisneros 03/14/2016, 2:20 PM

## 2016-03-14 NOTE — Care Management Important Message (Signed)
Important Message  Patient Details  Name: Sheila Cisneros MRN: [id number] Date of Birth: 01-13-1941   Medicare Important Message Given:  Yes    Epifanio LeschesCole, Yago Ludvigsen Hudson, RN 03/14/2016, 11:56 AM

## 2016-03-14 NOTE — Consult Note (Signed)
Consultation Note Date: 03/14/2016   Patient Name: Sheila Cisneros  DOB: 1940-08-08  MRN: [id number]  Age / Sex: 75 y.o., female  PCP: Etta Grandchild, MD Referring Physician: Osvaldo Shipper, MD  Reason for Consultation: Establishing goals of care  HPI/Patient Profile: 75 y.o. female  with past medical history of COPD, DM, recurrent aspiration, and recent pneumonia who was admitted on 03/05/2016 with hypoxic respiratory failure, acute kidney injury, and altered mental status. She was intubated and treated with broad spectrum antibiotics and her pneumonia has improved.  She had a recent admission in October for pneumonia as well.  She has known aspiration (even documented in Dr. Harriette Ohara office notes 6+ months ago).  She failed her swallow evaluation and was deemed high risk for aspiration on 11/29.   The patient was non-verbal on my exam but I spoke at length with her nephew and Architect.  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  He was made his Aunt's HCPOA on Monday when the Chaplain assisted with HCPOA and Living Will.  Sheila Cisneros tells me that 1 month ago his aunt was living at home and was able to walk to the mailbox. She lost her husband 2 years ago.  In October 2017 she developed pneumonia and was hospitalized.  He feels she never fully recovered before she aspirated and developed pneumonia once again.  He tells me that for the past month she has lived a "Bed to Wheel chair" existence.  He feels strongly that she needs nutrition and physical therapy in order to have any chance of recovery.  We discussed her medical condition.  Sheila Cisneros was surprised when I said "dementia" and told me that had never been discussed with him and he questions whether the diagnosis is appropriate.   He feels she has simply not recovered from  pneumonia and he expects that she will make a recovery.  We discussed dementia, recurrent aspiration pneumonia, code status, feeding tubes and his aunt's wishes.  Sheila Cisneros tells me that Monday when they completed the living will his Aunt said she wanted to be "here", she wanted to keep living.  I attempted to elicit from [place] what "quality" of life is for his Aunt, but he was unable to give me this information.  He told me that she choose to be a full code and he would not take that away from her at this point.  He did tell me that he will not allow her to languish on life support in the hospital.  He "would not put her thru that".  Sheila emphasized multiple times during our conversation that he feels her body needs to move and it needs nutrients - then she wouldn't be having these problems.    We discussed aspiration and feeding.  He wants her to have comfort feeds despite the risk of aspiration, because he wants he to be able to enjoy something in her mouth.  He understands that she is likely aspirating on her own saliva and will continue to do so.  He also wants her to have a PEG placed in order "ensure she is getting proper nutrition".  We discussed the risks of the procedure (perforation, bleeding, infection, the use of moderate sedation).  I also told him twice that we recommend AGAINST PEG placement in elderly patients with recurrent aspiration pneumonia such as his Aunt.  She will continue to aspirate and that the PEG will not extend her life or improve quality of life.  Sheila understands but wants the PEG placed anyway.    Sheila did say that after his Celine Ahrunt has the PEG/nutrition and PT/Rehab if her body does not respond or improve with in a reasonable period of time he will make a decision for comfort and dignity.  Sheila PicketScott is unavailable on 11/30 as he is having wrist surgery. He will be available and in the hospital on 12/1.  Primary Decision Maker:  Sheila MangesHCPOA, Sheila Cisneros [phone]    SUMMARY OF  RECOMMENDATIONS   HCPOA Pryor CuriaScott Cisneros wants his Aunt to have Physical Therapy. He wants PEG placement (against PMT advice) He [facility]   His goal is to be able to personally care for her at home after rehab.  Code Status/Advance Care Planning:  Full code    Symptom Management:   Per primary team  Palliative Prophylaxis:   Aspiration  Additional Recommendations (Limitations, Scope, Preferences):  Full Scope Treatment  Psycho-social/Spiritual:   Desire for further Chaplaincy support:yes  Additional Recommendations: Caregiving  Support/Resources  Prognosis:   Unable to determine  Discharge Planning: Skilled Nursing Facility for rehab with Palliative care service follow-up      Primary Diagnoses: Present on Admission: . Aspiration pneumonia (HCC)   I have reviewed the medical record, interviewed the patient and family, and examined the patient. The following aspects are pertinent.  Past Medical History:  Diagnosis Date  . Altered mental status 12/09/2012  . Arthritis    "all over" (12/09/2012)  . Chronic bronchitis (HCC)    "couple times/yr" (12/09/2012)  . Chronic lower back pain   . COPD (chronic obstructive pulmonary disease) (HCC)   . Diabetes mellitus without complication (HCC)   . Exertional shortness of breath   . GERD (gastroesophageal reflux disease)    rolaids if needed  . Gout, unspecified   . Hepatitis, unspecified    "the one that's not bad" (12/09/2012)  . History of blood transfusion    "related to a surgery, I think" (12/09/2012)  . Hyperlipidemia   . Hypertension   . Migraines    "in the past" (12/09/2012)  . Osteoarthrosis, unspecified whether generalized or localized, unspecified site   . Other chronic sinusitis   . Pneumonia    "couple times; long time ago" (12/09/2012)  . Unspecified chronic bronchitis    sees Dr. Melba Coon. Young, last visit 08/2011, treated for E. Coli- resp. /w Ceftin, Feb.  2013   Social History   Social History  . Marital status: Divorced    Spouse name: N/A  . Number of children: N/A  . Years of education: N/A   Social History Main Topics  . Smoking status: Never Smoker  . Smokeless tobacco: Never Used  . Alcohol use Yes     Comment: 12/09/2012 "have a taste yearly"  . Drug use: No  . Sexual activity: Not Currently   Other Topics Concern  . Not on file  Social History Narrative   Pt exercises approx 4-5 days a week   Family History  Problem Relation Age of Onset  . Heart disease Father   . ALS Brother    Scheduled Meds: . budesonide (PULMICORT) nebulizer solution  0.5 mg Nebulization BID  . heparin  5,000 Units Subcutaneous Q8H  . insulin aspart  0-9 Units Subcutaneous Q4H  . ipratropium-albuterol  3 mL Nebulization BID  . mouth rinse  15 mL Mouth Rinse BID  . venlafaxine  37.5 mg Oral Q breakfast   Continuous Infusions: . dextrose 5 % and 0.45% NaCl 1,000 mL infusion 50 mL/hr at 03/14/16 0759   PRN Meds:.sodium chloride, ipratropium-albuterol Allergies  Allergen Reactions  . Codeine Rash and Other (See Comments)     angioedema  . Neomycin Itching  . Penicillins Itching and Rash       . Tetanus Toxoids Rash  . Diphtheria Toxoid-Containing Vaccines Other (See Comments)  . Levofloxacin Rash   Review of Systems patient is unable to speak due to dementia.  Physical Exam  Constitutional:  Elderly frail, does not look me in the eye or speak.  Eyes: Left eye exhibits no discharge. No scleral icterus.  Neck: Neck supple. No JVD present.  Cardiovascular: Normal rate.  Exam reveals no gallop and no friction rub.   No murmur heard. Pulmonary/Chest: Effort normal. No respiratory distress.  Abdominal: Soft. Bowel sounds are normal. She exhibits no distension.  Musculoskeletal: She exhibits edema. She exhibits no deformity.  1+ edema in the dorsum of her feet.  Neurological:  Does not look at me or speak.  She is awake, she does not  follow commands.   Skin: Skin is warm and dry.  Psychiatric:  Non verbal, awake, frowns and looks away.    Vital Signs: BP 116/65 (BP Location: Right Arm)   Pulse 99   Temp 99.6 F (37.6 C)   Resp 17   Ht 5\' 2"  (1.575 m)   Wt 46.3 kg (102 lb 1.6 oz)   SpO2 100%   BMI 18.67 kg/m  Pain Assessment: No/denies pain   Pain Score: Asleep   SpO2: SpO2: 100 % O2 Device:SpO2: 100 % O2 Flow Rate: .O2 Flow Rate (L/min): 3 L/min  IO: Intake/output summary:  Intake/Output Summary (Last 24 hours) at 03/14/16 1444 Last data filed at 03/14/16 0759  Gross per 24 hour  Intake          1399.16 ml  Output                0 ml  Net          1399.16 ml    LBM: Last BM Date: 03/13/16 Baseline Weight: Weight: 47.2 kg (104 lb) Most recent weight: Weight: 46.3 kg (102 lb 1.6 oz)     Palliative Assessment/Data:   Flowsheet Rows   Flowsheet Row Most Recent Value  Intake Tab  Referral Department  Hospitalist  Unit at Time of Referral  Med/Surg Unit  Palliative Care Primary Diagnosis  Other (Comment)  Date Notified  03/13/16  Palliative Care Type  New Palliative care  Reason for referral  Clarify Goals of Care  Date of Admission  03/05/16  Date first seen by Palliative Care  03/14/16  # of days Palliative referral response time  1 Day(s)  # of days IP prior to Palliative referral  8  Clinical Assessment  Palliative Performance Scale Score  20%  Psychosocial & Spiritual Assessment  Palliative Care Outcomes  Patient/Family meeting  held?  Yes  Who was at the meeting?  Nephew (HCPOA) Pryor CuriaScott Cisneros and patient.  Palliative Care Outcomes  Clarified goals of care      Time In: 1:30 Time Out: 2:40 Time Total: 70 min Greater than 50%  of this time was spent counseling and coordinating care related to the above assessment and plan.  Signed by: Algis DownsMarianne York, PA-C Palliative Medicine Pager: [phone]  Please contact Palliative Medicine Team phone at [phone] for questions and  concerns.  For individual provider: See Loretha StaplerAmion

## 2016-03-14 NOTE — Progress Notes (Signed)
[facility] PROGRESS NOTE  Sheila Cisneros ZOX:[id number] DOB: 05-15-1940 DOA: 03/05/2016  PCP: Sanda Lingerhomas Jones, MD  Brief History/Interval Summary: 75 year old African-American female with a past medical history of dementia, GERD, type 2 diabetes, COPD, hypertension, chronic low back pain, hepatitis presented from an assisted living facility. At baseline, patient is nonverbal and has had multiple aspiration events in past. She was found to be in acute respiratory failure. She had to be intubated in the emergency department and was admitted to the ICU. She was stabilized, extubated and then transferred to the floor.  Reason for Visit: Aspiration pneumonia  Consultants: Palliative medicine  Procedures: Intubated  Antibiotics: Completed course of antibiotics. She was on the following at various times: Unasyn, cefepime and vancomycin  Subjective/Interval History: Patient does not communicate much.  ROS: Unable to do due to her dementia  Objective:  Vital Signs  Vitals:   03/14/16 0023 03/14/16 0500 03/14/16 0647 03/14/16 1000  BP: (!) 157/90  116/65   Pulse: (!) 103  99   Resp:   17   Temp:   99.6 F (37.6 C)   TempSrc:      SpO2: 98%  97% 100%  Weight:  46.3 kg (102 lb 1.6 oz)    Height:        Intake/Output Summary (Last 24 hours) at 03/14/16 1412 Last data filed at 03/14/16 0759  Gross per 24 hour  Intake          1399.16 ml  Output                0 ml  Net          1399.16 ml   Filed Weights   03/12/16 0500 03/13/16 0806 03/14/16 0500  Weight: 47.1 kg (103 lb 13.4 oz) 48.2 kg (106 lb 3.2 oz) 46.3 kg (102 lb 1.6 oz)    General appearance: alert and Not cooperative. Noncommunicative. Does speak a word here in the. Resp: Diminished air entry at the bases. No crackles, wheezing or rhonchi. Cardio: regular rate and rhythm, S1, S2 normal, no murmur, click, rub or gallop GI: soft, non-tender; bowel sounds normal; no masses,  no organomegaly Extremities:  extremities normal, atraumatic, no cyanosis or edema Neurologic: Awake. Not cooperative with examination. Moves some extremities at times.  Lab Results:  Data Reviewed: I have personally reviewed following labs and imaging studies  CBC:  Recent Labs Lab 03/10/16 0452 03/11/16 0512 03/12/16 0226 03/13/16 0619 03/14/16 0634  WBC 13.2* 12.6* 10.9* 10.5 13.5*  HGB 8.9* 8.7* 9.0* 12.6 10.9*  HCT 28.3* 27.8* 29.0* 42.4 33.6*  MCV 86.8 87.1 87.6 86.2 86.4  PLT 296 345 398 341 458*    Basic Metabolic Panel:  Recent Labs Lab 03/10/16 0452 03/11/16 0245 03/12/16 0226 03/13/16 0619 03/14/16 0634  NA 141 142 140 138 136  K 4.2 3.7 3.4* 3.4* 5.2*  CL 109 108 105 100* 103  CO2 24 23 25 25 22   GLUCOSE 185* 51* 120* 137* 125*  BUN 21* 20 15 9 9   CREATININE 1.01* 0.97 0.92 0.77 1.03*  CALCIUM 9.0 9.1 9.3 10.3 9.7  MG 2.0 1.9 1.6* 1.9 1.7  PHOS 3.2 4.1 4.3 3.4 3.3    GFR: Estimated Creatinine Clearance: 35 mL/min (by C-G formula based on SCr of 1.03 mg/dL (H)).  CBG:  Recent Labs Lab 03/13/16 1705 03/14/16 0021 03/14/16 0456 03/14/16 0800 03/14/16 1157  GLUCAP 115* 172* 111* 120* 151*     Recent Results (from the past  240 hour(s))  Culture, blood (Routine x 2)     Status: None   Collection Time: 03/05/16 10:20 AM  Result Value Ref Range Status   Specimen Description BLOOD RIGHT FOREARM  Final   Special Requests BOTTLES DRAWN AEROBIC AND ANAEROBIC 5CC  Final   Culture NO GROWTH 5 DAYS  Final   Report Status 03/10/2016 FINAL  Final  Culture, blood (Routine x 2)     Status: None   Collection Time: 03/05/16 10:29 AM  Result Value Ref Range Status   Specimen Description BLOOD RIGHT HAND  Final   Special Requests BOTTLES DRAWN AEROBIC AND ANAEROBIC 5CC  Final   Culture NO GROWTH 5 DAYS  Final   Report Status 03/10/2016 FINAL  Final  Urine culture     Status: None   Collection Time: 03/05/16 10:30 AM  Result Value Ref Range Status   Specimen Description URINE,  CATHETERIZED  Final   Special Requests NONE  Final   Culture NO GROWTH  Final   Report Status 03/06/2016 FINAL  Final  MRSA PCR Screening     Status: Abnormal   Collection Time: 03/05/16  2:47 PM  Result Value Ref Range Status   MRSA by PCR POSITIVE (A) NEGATIVE Final    Comment:        The GeneXpert MRSA Assay (FDA approved for NASAL specimens only), is one component of a comprehensive MRSA colonization surveillance program. It is not intended to diagnose MRSA infection nor to guide or monitor treatment for MRSA infections. RESULT CALLED TO, READ BACK BY AND VERIFIED WITH: Mirian Capuchin RN 16:45 03/05/16 ([email or web address])   Culture, respiratory (NON-Expectorated)     Status: None   Collection Time: 03/07/16 11:45 AM  Result Value Ref Range Status   Specimen Description TRACHEAL ASPIRATE  Final   Special Requests NONE  Final   Gram Stain   Final    ABUNDANT WBC PRESENT, PREDOMINANTLY PMN RARE YEAST    Culture FEW METHICILLIN RESISTANT STAPHYLOCOCCUS AUREUS  Final   Report Status 03/10/2016 FINAL  Final   Organism ID, Bacteria METHICILLIN RESISTANT STAPHYLOCOCCUS AUREUS  Final      Susceptibility   Methicillin resistant staphylococcus aureus - MIC*    CIPROFLOXACIN >=8 RESISTANT Resistant     ERYTHROMYCIN >=8 RESISTANT Resistant     GENTAMICIN <=0.5 SENSITIVE Sensitive     OXACILLIN >=4 RESISTANT Resistant     TETRACYCLINE >=16 RESISTANT Resistant     VANCOMYCIN 1 SENSITIVE Sensitive     TRIMETH/SULFA <=10 SENSITIVE Sensitive     CLINDAMYCIN >=8 RESISTANT Resistant     RIFAMPIN <=0.5 SENSITIVE Sensitive     Inducible Clindamycin NEGATIVE Sensitive     * FEW METHICILLIN RESISTANT STAPHYLOCOCCUS AUREUS      Radiology Studies: No results found.   Medications:  Scheduled: . budesonide (PULMICORT) nebulizer solution  0.5 mg Nebulization BID  . heparin  5,000 Units Subcutaneous Q8H  . insulin aspart  0-9 Units Subcutaneous Q4H  . ipratropium-albuterol  3 mL Nebulization BID  .  mouth rinse  15 mL Mouth Rinse BID  . venlafaxine  37.5 mg Oral Q breakfast   Continuous: . dextrose 5 % and 0.45% NaCl 1,000 mL infusion 50 mL/hr at 03/14/16 0759   ZOX:WRUEAV chloride, ipratropium-albuterol  Assessment/Plan:  Active Problems:   Aspiration pneumonia (HCC)   Acute respiratory failure with hypoxia (HCC)   AKI (acute kidney injury) (HCC)   Pressure injury of skin   Acute on chronic respiratory failure with hypoxia (HCC)  Pneumonia of both lower lobes due to methicillin resistant Staphylococcus aureus (MRSA) (HCC)   Panlobular emphysema (HCC)   Acute pulmonary edema (HCC)   Chronic diastolic CHF (congestive heart failure) (HCC)   Acute kidney injury (HCC)   Oropharyngeal dysphagia    Acute on Chronic Respiratory Failure with hypoxia/ positive MRSA in sputum/Aspiration PNA Her presentation was most likely secondary to aspiration, although she was found to have MRSA in sputum. She has completed 10 days of treatment with vancomycin. Patient expected to continue to have recurrent aspiration pneumonia. Palliative medicine has been consulted to assist with management. Currently, appears to be stable from a respiratory standpoint.  History of COPD, Bronchiectasis Stable. Continue Pulmicort. Continue nebulizer treatments.   Pulm edema improving.  Resolved  S/P hypotension 2/2 hypovoplemia and sepsis Resolved  Chronic diastolic CHF Stable. Strict ins and outs, daily weights.  Hypokalemia Was hypokalemic yesterday. Today potassium is noted to be slightly elevated. Appears to be a hemolyzed sample. Repeat tomorrow.  AKI  Thought to be secondary to hypokalemia. Now resolved.  Dysphagia (Multiple aspiration events)  Patient failed swallow study on 11/8. Nothing by mouth for now. Palliative medicine has been consulted to initiate goals of care discussion with family. It appears that the only 2 options include a PEG tube placement versus hospice.   DM Type 2  controlled with complications Sensitive SSI  Dementia/Encephalopathy secondary to PNA;  Could be at baseline  DVT Prophylaxis: Heparin subcutaneously    Code Status: Full code for now  Family Communication: No family at bedside. Apparently, there is a nephew who is the power of attorney. Disposition Plan: Await palliative medicine input.    LOS: 9 days   [facility] Hospital[facility]ISHNAN,Canio Winokur  [facility] Pager [phone] 03/14/2016, 2:12 PM  If 7PM-7AM, please contact night-coverage at [email or web address], password [email or web address]

## 2016-03-15 ENCOUNTER — Inpatient Hospital Stay (HOSPITAL_COMMUNITY): Payer: Medicare Other

## 2016-03-15 DIAGNOSIS — T17908S Unspecified foreign body in respiratory tract, part unspecified causing other injury, sequela: Secondary | ICD-10-CM

## 2016-03-15 LAB — GLUCOSE, CAPILLARY
GLUCOSE-CAPILLARY: 105 mg/dL — AB (ref 65–99)
GLUCOSE-CAPILLARY: 128 mg/dL — AB (ref 65–99)
Glucose-Capillary: 117 mg/dL — ABNORMAL HIGH (ref 65–99)
Glucose-Capillary: 131 mg/dL — ABNORMAL HIGH (ref 65–99)
Glucose-Capillary: 132 mg/dL — ABNORMAL HIGH (ref 65–99)
Glucose-Capillary: 143 mg/dL — ABNORMAL HIGH (ref 65–99)
Glucose-Capillary: 160 mg/dL — ABNORMAL HIGH (ref 65–99)

## 2016-03-15 LAB — BASIC METABOLIC PANEL
Anion gap: 10 (ref 5–15)
BUN: 8 mg/dL (ref 6–20)
CHLORIDE: 105 mmol/L (ref 101–111)
CO2: 23 mmol/L (ref 22–32)
Calcium: 9.9 mg/dL (ref 8.9–10.3)
Creatinine, Ser: 0.97 mg/dL (ref 0.44–1.00)
GFR calc Af Amer: >60 mL/min (ref >60)
GFR calc non Af Amer: 56 mL/min — ABNORMAL LOW (ref >60)
Glucose, Bld: 93 mg/dL (ref 65–99)
POTASSIUM: 3.4 mmol/L — AB (ref 3.5–5.1)
Sodium: 138 mmol/L (ref 135–145)

## 2016-03-15 LAB — PHOSPHORUS: Phosphorus: 3.6 mg/dL (ref 2.5–4.6)

## 2016-03-15 LAB — CBC
HEMATOCRIT: 34.1 % — AB (ref 36.0–46.0)
HEMOGLOBIN: 11 g/dL — AB (ref 12.0–15.0)
MCH: 27.6 pg (ref 26.0–34.0)
MCHC: 32.3 g/dL (ref 30.0–36.0)
MCV: 85.7 fL (ref 78.0–100.0)
PLATELETS: 466 10*3/uL — AB (ref 150–400)
RBC: 3.98 MIL/uL (ref 3.87–5.11)
RDW: 16 % — ABNORMAL HIGH (ref 11.5–15.5)
WBC: 11.9 10*3/uL — ABNORMAL HIGH (ref 4.0–10.5)

## 2016-03-15 LAB — MAGNESIUM: Magnesium: 1.6 mg/dL — ABNORMAL LOW (ref 1.7–2.4)

## 2016-03-15 MED ORDER — MAGNESIUM SULFATE 2 GM/50ML IV SOLN
2.0000 g | Freq: Once | INTRAVENOUS | Status: AC
  Administered 2016-03-15: 2 g via INTRAVENOUS
  Filled 2016-03-15: qty 50

## 2016-03-15 NOTE — Progress Notes (Signed)
[facility] PROGRESS NOTE  Sheila Cisneros ZOX:[id number] DOB: [birth year] DOA: 03/05/2016  PCP: Sanda Lingerhomas Jones, MD  Brief History/Interval Summary: 75 year old African-American female with a past medical history of dementia, GERD, type 2 diabetes, COPD, hypertension, chronic low back pain, hepatitis presented from an assisted living facility. At baseline, patient is nonverbal and has had multiple aspiration events in past. She was found to be in acute respiratory failure. She had to be intubated in the emergency department and was admitted to the ICU. She was stabilized, extubated and then transferred to the floor.  Reason for Visit: Aspiration pneumonia  Consultants: Palliative medicine. Interventional radiology  Procedures: Intubation  Antibiotics: Completed course of antibiotics. She was on the following at various times: Unasyn, cefepime and vancomycin  Subjective/Interval History: Patient does not communicate much.  ROS: Unable to do due to her dementia  Objective:  Vital Signs  Vitals:   03/14/16 2105 03/15/16 0243 03/15/16 0535 03/15/16 0830  BP: (!) 179/92  (!) 179/89   Pulse: (!) 110  98   Resp: 17  18   Temp: 97.2 F (36.2 C)  97.6 F (36.4 C)   TempSrc: Axillary  Axillary   SpO2: 97%  98% 100%  Weight:  46.7 kg (102 lb 14.4 oz)    Height:        Intake/Output Summary (Last 24 hours) at 03/15/16 0910 Last data filed at 03/15/16 0600  Gross per 24 hour  Intake          1100.83 ml  Output                0 ml  Net          1100.83 ml   Filed Weights   03/13/16 0806 03/14/16 0500 03/15/16 0243  Weight: 48.2 kg (106 lb 3.2 oz) 46.3 kg (102 lb 1.6 oz) 46.7 kg (102 lb 14.4 oz)    General appearance: alert and uncooperative. Noncommunicative.  Resp: Diminished air entry at the bases. No crackles, wheezing or rhonchi. Cardio: regular rate and rhythm, S1, S2 normal, no murmur, click, rub or gallop GI: soft, non-tender; bowel sounds normal; no masses,  no  organomegaly Extremities: extremities normal, atraumatic, no cyanosis or edema Neurologic: Awake. Not cooperative with examination. Moves some extremities at times.  Lab Results:  Data Reviewed: I have personally reviewed following labs and imaging studies  CBC:  Recent Labs Lab 03/11/16 0512 03/12/16 0226 03/13/16 0619 03/14/16 0634 03/15/16 0416  WBC 12.6* 10.9* 10.5 13.5* 11.9*  HGB 8.7* 9.0* 12.6 10.9* 11.0*  HCT 27.8* 29.0* 42.4 33.6* 34.1*  MCV 87.1 87.6 86.2 86.4 85.7  PLT 345 398 341 458* 466*    Basic Metabolic Panel:  Recent Labs Lab 03/11/16 0245 03/12/16 0226 03/13/16 0619 03/14/16 0634 03/15/16 0416  NA 142 140 138 136 138  K 3.7 3.4* 3.4* 5.2* 3.4*  CL 108 105 100* 103 105  CO2 23 25 25 22 23   GLUCOSE 51* 120* 137* 125* 93  BUN 20 15 9 9 8   CREATININE 0.97 0.92 0.77 1.03* 0.97  CALCIUM 9.1 9.3 10.3 9.7 9.9  MG 1.9 1.6* 1.9 1.7 1.6*  PHOS 4.1 4.3 3.4 3.3 3.6    GFR: Estimated Creatinine Clearance: 37.5 mL/min (by C-G formula based on SCr of 0.97 mg/dL).  CBG:  Recent Labs Lab 03/14/16 1703 03/14/16 2104 03/15/16 0008 03/15/16 0453 03/15/16 0826  GLUCAP 100* 136* 131* 105* 128*     Recent Results (from the past 240 hour(s))  Culture, blood (Routine x 2)     Status: None   Collection Time: 03/05/16 10:20 AM  Result Value Ref Range Status   Specimen Description BLOOD RIGHT FOREARM  Final   Special Requests BOTTLES DRAWN AEROBIC AND ANAEROBIC 5CC  Final   Culture NO GROWTH 5 DAYS  Final   Report Status 03/10/2016 FINAL  Final  Culture, blood (Routine x 2)     Status: None   Collection Time: 03/05/16 10:29 AM  Result Value Ref Range Status   Specimen Description BLOOD RIGHT HAND  Final   Special Requests BOTTLES DRAWN AEROBIC AND ANAEROBIC 5CC  Final   Culture NO GROWTH 5 DAYS  Final   Report Status 03/10/2016 FINAL  Final  Urine culture     Status: None   Collection Time: 03/05/16 10:30 AM  Result Value Ref Range Status    Specimen Description URINE, CATHETERIZED  Final   Special Requests NONE  Final   Culture NO GROWTH  Final   Report Status 03/06/2016 FINAL  Final  MRSA PCR Screening     Status: Abnormal   Collection Time: 03/05/16  2:47 PM  Result Value Ref Range Status   MRSA by PCR POSITIVE (A) NEGATIVE Final    Comment:        The GeneXpert MRSA Assay (FDA approved for NASAL specimens only), is one component of a comprehensive MRSA colonization surveillance program. It is not intended to diagnose MRSA infection nor to guide or monitor treatment for MRSA infections. RESULT CALLED TO, READ BACK BY AND VERIFIED WITH: Mirian CapuchinM. Toler RN 16:45 03/05/16 ([email or web address])   Culture, respiratory (NON-Expectorated)     Status: None   Collection Time: 03/07/16 11:45 AM  Result Value Ref Range Status   Specimen Description TRACHEAL ASPIRATE  Final   Special Requests NONE  Final   Gram Stain   Final    ABUNDANT WBC PRESENT, PREDOMINANTLY PMN RARE YEAST    Culture FEW METHICILLIN RESISTANT STAPHYLOCOCCUS AUREUS  Final   Report Status 03/10/2016 FINAL  Final   Organism ID, Bacteria METHICILLIN RESISTANT STAPHYLOCOCCUS AUREUS  Final      Susceptibility   Methicillin resistant staphylococcus aureus - MIC*    CIPROFLOXACIN >=8 RESISTANT Resistant     ERYTHROMYCIN >=8 RESISTANT Resistant     GENTAMICIN <=0.5 SENSITIVE Sensitive     OXACILLIN >=4 RESISTANT Resistant     TETRACYCLINE >=16 RESISTANT Resistant     VANCOMYCIN 1 SENSITIVE Sensitive     TRIMETH/SULFA <=10 SENSITIVE Sensitive     CLINDAMYCIN >=8 RESISTANT Resistant     RIFAMPIN <=0.5 SENSITIVE Sensitive     Inducible Clindamycin NEGATIVE Sensitive     * FEW METHICILLIN RESISTANT STAPHYLOCOCCUS AUREUS      Radiology Studies: No results found.   Medications:  Scheduled: . budesonide (PULMICORT) nebulizer solution  0.5 mg Nebulization BID  . heparin  5,000 Units Subcutaneous Q8H  . insulin aspart  0-9 Units Subcutaneous Q4H  . ipratropium-albuterol   3 mL Nebulization BID  . magnesium sulfate 1 - 4 g bolus IVPB  2 g Intravenous Once  . mouth rinse  15 mL Mouth Rinse BID  . venlafaxine  37.5 mg Oral Q breakfast   Continuous: . dextrose 5 % and 0.45% NaCl 1,000 mL infusion 50 mL/hr at 03/15/16 0526   WUJ:WJXBJYPRN:sodium chloride, ipratropium-albuterol  Assessment/Plan:  Active Problems:   Aspiration pneumonia (HCC)   Acute respiratory failure with hypoxia (HCC)   AKI (acute kidney injury) (HCC)   Pressure injury  of skin   Acute on chronic respiratory failure with hypoxia (HCC)   Pneumonia of both lower lobes due to methicillin resistant Staphylococcus aureus (MRSA) (HCC)   Panlobular emphysema (HCC)   Acute pulmonary edema (HCC)   Chronic diastolic CHF (congestive heart failure) (HCC)   Acute kidney injury (HCC)   Oropharyngeal dysphagia   Aspiration into airway   Palliative care encounter   Goals of care, counseling/discussion    Acute on Chronic Respiratory Failure with hypoxia/ positive MRSA in sputum/Aspiration PNA Her presentation was most likely secondary to aspiration, although she was found to have MRSA in sputum. She has completed 10 days of treatment with vancomycin. Patient expected to continue to have recurrent aspiration pneumonia. Palliative medicine was consulted to assist with management. Currently, appears to be stable from a respiratory standpoint.  Dysphagia (Multiple aspiration events)  Patient failed swallow study on 11/28. Nothing by mouth for now. Palliative medicine was consulted. Palliative medicines to 2 the patient's power of attorney. After long discussions over the phone power of attorney mentioning that he wants everything done for the patient based on her previous wishes. In view of this interventional radiology was consulted for PEG tube placement, even though this is not ideal for this patient. Would not feed her orally due to high aspiration risk.  History of COPD, Bronchiectasis Stable. Continue  Pulmicort. Continue nebulizer treatments.   Pulm edema.  Resolved  S/P hypotension 2/2 hypovoplemia and sepsis Resolved  Chronic diastolic CHF Stable. Strict ins and outs, daily weights.  Hypokalemia Potassium is noted to be slightly low this morning. Repeat tomorrow.   AKI  Thought to be secondary to hypokalemia. Now resolved.  DM Type 2 controlled with complications Sensitive SSI  Dementia/Encephalopathy secondary to PNA;  Could be at baseline now.  DVT Prophylaxis: Heparin subcutaneously    Code Status: Full code for now  Family Communication: No family at bedside. There is a nephew who is the power of attorney. Disposition Plan: Healthcare power of attorney is elected to pursue PEG tube placement. Interventional radiology consulted.    LOS: 10 days   [facility]  [facility] Pager [phone] 03/15/2016, 9:10 AM  If 7PM-7AM, please contact night-coverage at [email or web address], password [email or web address]

## 2016-03-15 NOTE — Progress Notes (Signed)
Patient ID: Sheila Cisneros, female   DOB: [birth year], 75 y.o.   MRN: [id number] Aware of request for g-tube.  Her CT of her chest from May was reviewed and reveals a hiatal hernia and concerning anatomy to access her stomach for this procedure.  We will obtain a CT abd to better evaluate.  Palliative care's note has been reviewed and agree with their statements that a g-tube will still put this patient at risk for aspirations.  A GJ tube would be more appropriate to avoid aspiration risk, but also carries other risks and problems just as continuous feeds and no bolus feeds etc.  The HCPOA is not here today.  Will await CT scan to determine whether she is a candidate for g-tube placement or not.  [name] E 12:13 PM 03/15/2016

## 2016-03-15 NOTE — Progress Notes (Addendum)
Daily Progress Note   Patient Name: Sheila Cisneros       Date: 03/15/2016 DOB: 1940-10-12  Age: 75 y.o. MRN#: [id number] Attending Physician: Sheila ShipperGokul Krishnan, MD Primary Care Physician: Sheila Lingerhomas Jones, MD Admit Date: 03/05/2016  Reason for Consultation/Follow-up: Establishing goals of care  Subjective: Patient speaks a little more, but is unable to tell me who she is or where she is.  Denies pain.  Length of Stay: 10  Current Medications: Scheduled Meds:  . budesonide (PULMICORT) nebulizer solution  0.5 mg Nebulization BID  . heparin  5,000 Units Subcutaneous Q8H  . insulin aspart  0-9 Units Subcutaneous Q4H  . ipratropium-albuterol  3 mL Nebulization BID  . magnesium sulfate 1 - 4 g bolus IVPB  2 g Intravenous Once  . mouth rinse  15 mL Mouth Rinse BID  . venlafaxine  37.5 mg Oral Q breakfast    Continuous Infusions: . dextrose 5 % and 0.45% NaCl 1,000 mL infusion 50 mL/hr at 03/15/16 0526    PRN Meds: sodium chloride, ipratropium-albuterol  Physical Exam  Constitutional: No distress.  Frail.  In bed.  HENT:  Head: Normocephalic and atraumatic.  Eyes: EOM are normal. No scleral icterus.  Neck: Normal range of motion. Neck supple.  Cardiovascular: Normal rate.  Exam reveals no gallop and no friction rub.   No murmur heard. Pulmonary/Chest: Effort normal. No respiratory distress. She has no wheezes.  Abdominal: Soft. She exhibits no distension. There is no tenderness.  Musculoskeletal: She exhibits no edema or deformity.  Neurological: She is alert.  Gives short appropriate answer to some questions, but is unable to tell me who she is or where we are.  Psychiatric:  Calm. Shuts down after 2-3 questions and will not respond any further           Vital Signs: BP (!) 179/89  (BP Location: Right Arm)   Pulse 98   Temp 97.6 F (36.4 C) (Axillary)   Resp 18   Ht 5\' 2"  (1.575 m)   Wt 46.7 kg (102 lb 14.4 oz)   SpO2 100%   BMI 18.82 kg/m  SpO2: SpO2: 100 % O2 Device: O2 Device: Not Delivered O2 Flow Rate: O2 Flow Rate (L/min): 3 L/min  Intake/output summary:  Intake/Output Summary (Last 24 hours) at 03/15/16 1054 Last data filed at  03/15/16 0600  Gross per 24 hour  Intake          1100.83 ml  Output                0 ml  Net          1100.83 ml   LBM: Last BM Date: 03/13/16 Baseline Weight: Weight: 47.2 kg (104 lb) Most recent weight: Weight: 46.7 kg (102 lb 14.4 oz)       Palliative Assessment/Data:    Flowsheet Rows   Flowsheet Row Most Recent Value  Intake Tab  Referral Department  Hospitalist  Unit at Time of Referral  Med/Surg Unit  Palliative Care Primary Diagnosis  Other (Comment)  Date Notified  03/13/16  Palliative Care Type  New Palliative care  Reason for referral  Clarify Goals of Care  Date of Admission  03/05/16  Date first seen by Palliative Care  03/14/16  # of days Palliative referral response time  1 Day(s)  # of days IP prior to Palliative referral  8  Clinical Assessment  Palliative Performance Scale Score  20%  Psychosocial & Spiritual Assessment  Palliative Care Outcomes  Patient/Family meeting held?  Yes  Who was at the meeting?  Nephew (HCPOA) Sheila Cisneros and patient.  Palliative Care Outcomes  Clarified goals of care      Patient Active Problem List   Diagnosis Date Noted  . Aspiration into airway   . Palliative care encounter   . Goals of care, counseling/discussion   . Acute on chronic respiratory failure with hypoxia (HCC)   . Pneumonia of both lower lobes due to methicillin resistant Staphylococcus aureus (MRSA) (HCC)   . Panlobular emphysema (HCC)   . Acute pulmonary edema (HCC)   . Chronic diastolic CHF (congestive heart failure) (HCC)   . Acute kidney injury (HCC)   . Oropharyngeal dysphagia     . Aspiration pneumonia (HCC) 03/05/2016  . Pressure injury of skin 03/05/2016  . Acute respiratory failure with hypoxia (HCC)   . AKI (acute kidney injury) (HCC)   . Torticollis 02/28/2016  . Encephalopathy, metabolic 02/07/2016  . HCAP (healthcare-associated pneumonia) 02/05/2016  . Bronchiectasis (HCC) 06/18/2015  . Insomnia due to anxiety and fear 06/09/2015  . Hearing loss 05/02/2015  . Mass of lower lobe of left lung 04/06/2015  . COPD (chronic obstructive pulmonary disease) (HCC) 03/23/2015  . Other screening mammogram 08/04/2013  . Duodenal ulcer 08/03/2013  . Hypokalemia 08/03/2013  . Hyperlipidemia with target LDL less than 100   . Type II diabetes mellitus with manifestations (HCC) 06/21/2013  . Scoliosis (and kyphoscoliosis), idiopathic 12/12/2012  . Vascular dementia 12/10/2012  . GERD (gastroesophageal reflux disease) 12/09/2012  . GOUT 03/07/2007  . Essential hypertension 03/07/2007    Palliative Care Assessment & Plan   Patient Profile: 75 y.o. female  with past medical history of COPD, DM, recurrent aspiration, and recent pneumonia who was admitted on 03/05/2016 with hypoxic respiratory failure, acute kidney injury, and altered mental status. She was intubated and treated with broad spectrum antibiotics and her pneumonia has improved.  She had a recent admission in October for pneumonia as well.  She has known aspiration (even documented in Dr. Harriette Oharalint Cisneros's office notes 6+ months ago).  She failed her swallow evaluation and was deemed high risk for aspiration on 11/29.   The patient was non-verbal on my exam but I spoke at length with her nephew and ArchitectHCPOA Sheila Cisneros.  Assessment: 75 yo f with dementia, recurrent  aspiration, debility.  Recommendations/Plan:  PEG placement ordered.  Anticipate she will discharge back to SNF.  Goals of Care and Additional Recommendations:  Limitations on Scope of Treatment: Full Scope Treatment  Code Status:  Full  code  Prognosis:   < 6 months given recurrent aspiration pna, bed bound status and significant deconditioning.  Discharge Planning:  Skilled Nursing Facility for rehab with Palliative care service follow-up  Care plan was discussed with Dr. Rito Cisneros  Thank you for allowing the Palliative Medicine Team to assist in the care of this patient.  Her goals for now are set.  We will follow at a distance to determine if there is an opportunity to re-engage with the family.  Please call us back if a more immediate need arises.   Time In: 10:45 Time Out: 11:00 Total Time 15 min Prolonged Time Billed no      Greater than 50%  of this time was spent counseling and coordinating care related to the above assessment and plan.  Algis Downs, PA-C Palliative Medicine   Please contact Palliative MedicineTeam phone at [phone] for questions and concerns.   Please see AMION for individual provider pager numbers.

## 2016-03-15 NOTE — Evaluation (Signed)
Physical Therapy Evaluation Patient Details Name: Sheila Cisneros MRN: [id number] DOB: [birth year] Today's Date: 03/15/2016   History of Present Illness  Pt is a 75 y/o female admitted secondary to unresponsiveness and AMS. PMH including but not limited to dementia, COPD, DM, HTN and aspiration. Pt was intubated 03/05/16 through 03/11/16.  Clinical Impression  Pt presented supine in bed with HOB elevated, awake but lethargic throughout. Pt's IV was completely dislodged when PT entered room and pt's nurse was notified. Pt unable to give much information regarding PLOF secondary to baseline of cognitive deficits (dementia). Pt currently requires total A x2 for functional mobility. Pt would continue to benefit from skilled physical therapy services at this time while admitted and after d/c to address her below listed limitations in order to improve her overall safety and independence with functional mobility.     Follow Up Recommendations SNF    Equipment Recommendations  None recommended by PT    Recommendations for Other Services       Precautions / Restrictions Precautions Precautions: Fall Restrictions Weight Bearing Restrictions: No      Mobility  Bed Mobility Overal bed mobility: Needs Assistance;+2 for physical assistance Bed Mobility: Supine to Sit;Sit to Supine     Supine to sit: Total assist;+2 for physical assistance Sit to supine: Total assist;+2 for physical assistance   General bed mobility comments: total A for all aspects  Transfers                    Ambulation/Gait                Stairs            Wheelchair Mobility    Modified Rankin (Stroke Patients Only)       Balance Overall balance assessment: Needs assistance Sitting-balance support: Feet unsupported;Bilateral upper extremity supported Sitting balance-Leahy Scale: Poor Sitting balance - Comments: pt intially max A to maintain upright sitting balance, but progressed to min  guard briefly Postural control: Left lateral lean                                   Pertinent Vitals/Pain Pain Assessment: No/denies pain    Home Living Family/patient expects to be discharged to:: Skilled nursing facility                 Additional Comments: pt came from [facility] per EMR    Prior Function Level of Independence: Needs assistance         Comments: Unclear secondary to pt's cognitive deficits at baseline (dementia) and no caregiver present.     Hand Dominance        Extremity/Trunk Assessment   Upper Extremity Assessment: Generalized weakness;RUE deficits/detail;LUE deficits/detail RUE Deficits / Details: pt did not demonstrate any active movement of bilateral UEs. PROM revealed biceps tightness bilaterally (R>L) limiting elbow extension ROM bilaterally.     LUE Deficits / Details: pt did not demonstrate any active movements of bilateral UEs. Pt with biceps tightness bilaterally (R>L) limited PROM of elbow extension.   Lower Extremity Assessment: Generalized weakness      Cervical / Trunk Assessment: Other exceptions  Communication   Communication: Expressive difficulties  Cognition Arousal/Alertness: Lethargic Behavior During Therapy: Flat affect Overall Cognitive Status: History of cognitive impairments - at baseline  General Comments      Exercises     Assessment/Plan    PT Assessment Patient needs continued PT services  PT Problem List Decreased strength;Decreased range of motion;Decreased activity tolerance;Decreased balance;Decreased mobility;Decreased coordination;Decreased cognition;Decreased knowledge of use of DME;Decreased safety awareness          PT Treatment Interventions DME instruction;Gait training;Stair training;Functional mobility training;Therapeutic activities;Therapeutic exercise;Balance training;Neuromuscular re-education;Cognitive remediation;Patient/family education     PT Goals (Current goals can be found in the Care Plan section)  Acute Rehab PT Goals Patient Stated Goal: unable to state PT Goal Formulation: Patient unable to participate in goal setting Time For Goal Achievement: 03/29/16 Potential to Achieve Goals: Fair    Frequency Min 2X/week   Barriers to discharge        Co-evaluation               End of Session   Activity Tolerance: Patient limited by fatigue Patient left: in bed;with call bell/phone within reach;with bed alarm set Nurse Communication: Mobility status         Time: 1340-1405 PT Time Calculation (min) (ACUTE ONLY): 25 min   Charges:   PT Evaluation $PT Eval Moderate Complexity: 1 Procedure PT Treatments $Therapeutic Activity: 8-22 mins   PT G CodesAlessandra Bevels:        Cyntia Staley M Lion Fernandez 03/15/2016, 2:44 PM Deborah ChalkJennifer Enijah Furr, PT, DPT [phone]

## 2016-03-15 NOTE — Progress Notes (Signed)
SLP Cancellation Note  Patient Details Name: Sheila Cisneros MRN: [id number] DOB: [birth year]   Cancelled treatment:       Reason Eval/Treat Not Completed: Other (comment). Pt NP and MD, No comfort feeding at this time. PEG tube pending per family wishes.    Alder Murri, Riley NearingBonnie Caroline 03/15/2016, 10:51 AM

## 2016-03-15 NOTE — Progress Notes (Signed)
Nutrition Follow-up  DOCUMENTATION CODES:   Not applicable  INTERVENTION:   If able to achieve enteral access, recommend:  Initiate Jevity 1.2 @ 20 ml/hr and increase by 10 ml every 4hours to goal rate of 6155ml/hr.   Tube feeding regimen provides 1584 kcal (100% of needs), 73 grams of protein, and 972 ml of H2O.   NUTRITION DIAGNOSIS:   Inadequate oral intake related to inability to eat as evidenced by NPO status.  Ongoing  GOAL:   Patient will meet greater than or equal to 90% of their needs  Unmet  MONITOR:   Vent status, Labs, TF tolerance, Skin, I & O's  REASON FOR ASSESSMENT:   Consult Enteral/tube feeding initiation and management  ASSESSMENT:   75 year old female with recent admission for PNA. At baseline patient has severe dementia and is non-verbal. Recently patient has had multiple events of aspiration. Presents to ED from SNF with diagnosis of PNA. During stay in ED patient had an aspiration event requiring intubation.   Pt transferred from ICU to medical floor on 03/12/16.   Pt underwent BSE and recommended continued NPO status.   Palliative care team following; per most recent note, discussed with HCPOA, who desires for pt to undergo PEG placement. IR has been consulted and concern this that CT of chest from 08/2015 reveals hiatal hernia and concerning anatomy to access stomach for procedure. Awaiting CT scan to determine if pt is a candidate for tube placement.   SLP note reflects that pt does not desire comfort feeds.   Labs reviewed: CBGS: 105-143.   Diet Order:  Diet NPO time specified  Skin:  Wound (see comment) (stage II to buttocks)  Last BM:  03/13/16  Height:   Ht Readings from Last 1 Encounters:  03/05/16 5\' 2"  (1.575 m)    Weight:   Wt Readings from Last 1 Encounters:  03/15/16 102 lb 14.4 oz (46.7 kg)    Ideal Body Weight:  50 kg  BMI:  Body mass index is 18.82 kg/m.  Estimated Nutritional Needs:   Kcal:   1350-1550  Protein:  70-80 gm  Fluid:  1.5 L  EDUCATION NEEDS:   No education needs identified at this time  Sheila Cisneros, RD, LDN, CDE Pager: [phone] After hours Pager: [phone]

## 2016-03-16 LAB — GLUCOSE, CAPILLARY
GLUCOSE-CAPILLARY: 116 mg/dL — AB (ref 65–99)
Glucose-Capillary: 105 mg/dL — ABNORMAL HIGH (ref 65–99)
Glucose-Capillary: 122 mg/dL — ABNORMAL HIGH (ref 65–99)
Glucose-Capillary: 115 mg/dL — ABNORMAL HIGH (ref 65–99)
Glucose-Capillary: 129 mg/dL — ABNORMAL HIGH (ref 65–99)

## 2016-03-16 LAB — PROTIME-INR
INR: 1.23
PROTHROMBIN TIME: 15.6 s — AB (ref 11.4–15.2)

## 2016-03-16 MED ORDER — VANCOMYCIN HCL IN DEXTROSE 1-5 GM/200ML-% IV SOLN
1000.0000 mg | Freq: Once | INTRAVENOUS | Status: AC
  Administered 2016-03-19: 1000 mg via INTRAVENOUS
  Filled 2016-03-16: qty 200

## 2016-03-16 NOTE — Consult Note (Signed)
Chief Complaint: Patient was seen in consultation today for percutaneous gastrostomy tube placement   Referring Physician(s): Dr. Osvaldo ShipperGokul Cisneros  Supervising Physician: Sheila Cisneros  [facility]  History of Present Illness: Sheila Cisneros is a 75 y.o. female with PMHx of dementia, GERD, DM2, COPD, HTN, hepatitis who presented from an assisted living facility (11/20) due to AMS.  Patient found to have sepsis from aspiration PNA and continued decreased responsiveness requiring intubation. Patient has since recovered from her acute PNA, however due to her underlying chronic illnesses, including dementia and recurrent aspiration PNA, Palliative Medicine was consulted to address goals of care.   Patient's nephew, Sheila Cisneros, is HPOA.  Per PMT note, he is hopeful with rehab and nutrition support, patient may be able to recover some of her functionality. He has requested gastric feeding tube placement. IR consulted for percutaneous gastrostomy tube placement.   CT abdomen completed (11/30).  Anatomy reviewed by Dr. Loreta Cisneros who feels anatomy appropriate for percutaneous gastrostomy tube.   Patient is NPO due to aspiration risk.  She is on SQ Heparin.    Past Medical History:  Diagnosis Date  . Altered mental status 12/09/2012  . Arthritis    "all over" (12/09/2012)  . Chronic bronchitis (HCC)    "couple times/yr" (12/09/2012)  . Chronic lower back pain   . COPD (chronic obstructive pulmonary disease) (HCC)   . Diabetes mellitus without complication (HCC)   . Exertional shortness of breath   . GERD (gastroesophageal reflux disease)    rolaids if needed  . Gout, unspecified   . Hepatitis, unspecified    "the one that's not bad" (12/09/2012)  . History of blood transfusion    "related to a surgery, I think" (12/09/2012)  . Hyperlipidemia   . Hypertension   . Migraines    "in the past" (12/09/2012)  . Osteoarthrosis, unspecified whether generalized or localized, unspecified  site   . Other chronic sinusitis   . Pneumonia    "couple times; long time ago" (12/09/2012)  . Unspecified chronic bronchitis    sees Dr. Melba Coon. Cisneros, last visit 08/2011, treated for E. Coli- resp. /w Ceftin, Feb. 2013    Past Surgical History:  Procedure Laterality Date  . ABDOMINAL HYSTERECTOMY     "w/right ovary" (12/09/2012)  . APPENDECTOMY    . BREAST BIOPSY Right   . CARPAL TUNNEL RELEASE  09/06/2011   Procedure: CARPAL TUNNEL RELEASE;  Surgeon: Sheila RadArthur F Carter, MD;  [facility];  Service: Orthopedics;  Laterality: Right;  . ESOPHAGOGASTRODUODENOSCOPY N/A 06/04/2013   Procedure: ESOPHAGOGASTRODUODENOSCOPY (EGD);  Surgeon: Sheila ShiverSalem F Ganem, MD;  [facility];  Service: Endoscopy;  Laterality: N/A;  . SHOULDER ARTHROSCOPY W/ ROTATOR CUFF REPAIR Right   . TONSILLECTOMY    . TUBAL LIGATION      Allergies: Codeine; Neomycin; Penicillins; Tetanus toxoids; Diphtheria toxoid-containing vaccines; and Levofloxacin  Medications: Prior to Admission medications   Medication Sig Start Date End Date Taking? Authorizing Provider  acetaminophen (TYLENOL) 500 MG tablet Take 500 mg by mouth every 6 (six) hours as needed. For pain   Yes Historical Provider, MD  albuterol (PROVENTIL) (2.5 MG/3ML) 0.083% nebulizer solution 3 ml every 4 hours scheduled x 14 days then 3 ml four times daily and every 4 hours as needed for SOB/Wheezing   Yes Historical Provider, MD  Ascorbic Acid (VITAMIN C) 500 MG tablet Take 500 mg by mouth daily.     Yes Historical Provider, MD  aspirin EC 81  MG tablet Take 81 mg by mouth daily.   Yes Historical Provider, MD  cholecalciferol (VITAMIN D) 1000 UNITS tablet Take 1,000 Units by mouth daily.   Yes Historical Provider, MD  dextromethorphan-guaiFENesin (MUCINEX DM) 30-600 MG 12hr tablet Take 1 tablet by mouth 2 (two) times daily.   Yes Historical Provider, MD  donepezil (ARICEPT) 5 MG tablet TAKE 1 TABLET (5 MG TOTAL) BY MOUTH AT BEDTIME. Patient taking differently:  TAKE 2 TABLET (10 MG TOTAL) BY MOUTH AT BEDTIME. 07/04/15  Yes Sheila Grandchild, MD  ipratropium-albuterol (DUONEB) 0.5-2.5 (3) MG/3ML SOLN Take 3 mLs by nebulization every 6 (six) hours as needed (for shortness of breath).   Yes Historical Provider, MD  Multiple Vitamin (MULTIVITAMIN) capsule Take 1 capsule by mouth daily.     Yes Historical Provider, MD  OVER THE COUNTER MEDICATION Take 1 Container by mouth daily. "Magic cup"   Yes Historical Provider, MD  OVER THE COUNTER MEDICATION Take 120 mLs by mouth 3 (three) times daily. medpass   Yes Historical Provider, MD  simvastatin (ZOCOR) 20 MG tablet Take 1 tablet (20 mg total) by mouth at bedtime. 11/15/14  Yes Sheila Grandchild, MD  Tiotropium Bromide-Olodaterol (STIOLTO RESPIMAT) 2.5-2.5 MCG/ACT AERS Inhale 2 puffs into the lungs daily. 10/17/15  Yes Sheila Grandchild, MD  venlafaxine (EFFEXOR) 37.5 MG tablet Take 37.5 mg by mouth See admin instructions. Takes 1 capsule daily, then can take 1 addt'l cap as needed for hot flashes   Yes Historical Provider, MD  verapamil (COVERA HS) 240 MG (CO) 24 hr tablet Take 1 tablet (240 mg total) by mouth at bedtime. 11/15/14  Yes Sheila Grandchild, MD     Family History  Problem Relation Age of Onset  . Heart disease Father   . ALS Brother     Social History   Social History  . Marital status: Divorced    Spouse name: N/A  . Number of children: N/A  . Years of education: N/A   Social History Main Topics  . Smoking status: Never Smoker  . Smokeless tobacco: Never Used  . Alcohol use Yes     Comment: 12/09/2012 "have a taste yearly"  . Drug use: No  . Sexual activity: Not Currently   Other Topics Concern  . Not on file   Social History Narrative   Pt exercises approx 4-5 days a week     Review of Systems Limited due to dementia and non-communicative at times  Vital Signs: BP (!) 156/77   Pulse 88   Temp 97.9 F (36.6 C) (Oral)   Resp 18   Ht 5\' 2"  (1.575 m)   Wt 105 lb 11.2 oz (47.9 kg)   SpO2  99%   BMI 19.33 kg/m   Physical Exam  Constitutional: She appears well-developed.  Cardiovascular: Normal rate, regular rhythm and normal heart sounds.   Pulmonary/Chest: Effort normal and breath sounds normal. No respiratory distress.  Abdominal: Soft. She exhibits no distension. There is no tenderness.  No scars on abdomen  Neurological: She is alert.  Psychiatric:  confused  Nursing note and vitals reviewed.   Mallampati Score:  MD Evaluation Airway: WNL Heart: WNL Abdomen: WNL Chest/ Lungs: WNL ASA  Classification: 3 Mallampati/Airway Score: Two  Imaging: Ct Abdomen Wo Contrast  Result Date: 03/16/2016 CLINICAL DATA:  Gastrostomy tube evaluation EXAM: CT ABDOMEN AND PELVIS WITHOUT CONTRAST TECHNIQUE: Multidetector CT imaging of the abdomen and pelvis was performed following the standard protocol without IV contrast. COMPARISON:  06/04/2013 FINDINGS: Lower chest: There is masslike consolidation at the left lung base. There is an associated pleural effusion which appears loculated. The masslike area in the left lower lobe measures 4.0 x 4.7 cm on image 6. It is partially imaged. Compare to a CT chest dated 08/18/2015, previously, this abnormality at the appearance of acute consolidation. On today's study, there are areas of low density without air bronchograms. This is atypical with respect to an inflammatory process in raises suspicion of other etiology suggest malignancy. Subsegmental atelectasis at the right lung base. Hepatobiliary: Stable small hypodensity in the right lobe of the liver, measuring 1.4 cm. Gallbladder contains small stones. Pancreas: Atrophic. Spleen: Unremarkable. Adrenals/Urinary Tract: No hydronephrosis or obvious mass. Cortical calcification along the g of the right kidney is stable and nonspecific. Adrenal glands are within normal limits. Stomach/Bowel: The body of the stomach is positioned between the left lobe of the liver and transverse colon. It is opposed  to the anterior abdominal wall. Vascular/Lymphatic: No evidence of aortic aneurysm. No obvious adenopathy. Other: No free-fluid. Musculoskeletal: Advanced levoscoliosis with the apex at L3-4. There is severe degenerative disc disease at L3-4 and L4-5. No definite acute compression deformity. Degenerative disc disease also occurs in the lower thoracic spine. IMPRESSION: There is favorable anatomy for gastrostomy tube placement as described above Masslike consolidation at the left lung base. This abnormality is partially imaged and measures up to 4.7 cm. Differential diagnosis includes an inflammatory process, round atelectasis, or malignancy. Consider PET-CT for further characterization. Cholelithiasis. Electronically Signed   By: Jolaine Click M.D.   On: 03/16/2016 08:26   Dg Chest Port 1 View  Result Date: 03/10/2016 CLINICAL DATA:  Respiratory failure EXAM: PORTABLE CHEST 1 VIEW COMPARISON:  03/09/2016 FINDINGS: Endotracheal tube and nasogastric catheter are again seen and stable. The overall inspiratory effort is poor with increasing left basilar infiltrate. No pneumothorax is noted. No bony abnormality is seen. IMPRESSION: Increasing left basilar infiltrate. Electronically Signed   By: Alcide Clever M.D.   On: 03/10/2016 07:47   Dg Chest Port 1 View  Result Date: 03/09/2016 CLINICAL DATA:  Intubated, ventilator EXAM: PORTABLE CHEST 1 VIEW COMPARISON:  03/08/2016 FINDINGS: Endotracheal tube and NG tube remain in place, unchanged. Left basilar atelectasis or infiltrate is stable. No confluent opacity on the right. Heart is normal size. No visible effusions. No acute bony abnormality. IMPRESSION: Stable left lower lobe atelectasis or infiltrate. Electronically Signed   By: Charlett Nose M.D.   On: 03/09/2016 07:45   Dg Chest Port 1 View  Result Date: 03/08/2016 CLINICAL DATA:  Endotracheal intubation. EXAM: PORTABLE CHEST 1 VIEW COMPARISON:  One-view chest x-ray 03/07/2016 FINDINGS: Endotracheal tube is  stable. NG tube terminates in the stomach. The heart is mildly enlarged. Left greater than right lower lobe airspace disease persists. The upper lung fields are clear. The visualized soft tissues and bony thorax are otherwise unremarkable. IMPRESSION: 1. The support apparatus is stable. 2. Bibasilar airspace disease per cysts, left greater than right. This is concerning for pneumonia or aspiration. There is no significant interval change. Electronically Signed   By: Marin Roberts M.D.   On: 03/08/2016 07:09   Dg Chest Port 1 View  Result Date: 03/07/2016 CLINICAL DATA:  Placement of endotracheal tube EXAM: PORTABLE CHEST 1 VIEW COMPARISON:  Portable chest x-ray of 03/06/2016 FINDINGS: The tip of the endotracheal tube is approximately 2.3 cm above the carina. There is persistent opacity at the left lung base and to lesser degree the right  lung base suspicious for pneumonia. No definite pleural effusion is seen. NG tube tip is noted to a point just below the gastroesophageal junction. Heart size is stable. IMPRESSION: 1. Endotracheal tube tip 2.3 cm above the carina. 2. Basilar opacities left-greater-than-right suspicious for pneumonia. Electronically Signed   By: Dwyane DeePaul  Barry M.D.   On: 03/07/2016 07:40   Dg Chest Port 1 View  Result Date: 03/06/2016 CLINICAL DATA:  75 year old female with aspiration pneumonia. Subsequent encounter. EXAM: PORTABLE CHEST 1 VIEW COMPARISON:  03/05/2016 FINDINGS: Endotracheal tube tip 3.1 cm above the carina. Nasogastric tube courses below the diaphragm. Tip gastric fundus level. Bibasilar consolidation asymmetric greater on the left appears similar or slightly worse when compared to prior exam. Central pulmonary vascular prominence. No gross pneumothorax. Heart size top-normal. Superiorly located right humeral head with degenerative changes suggesting rotator cuff injury unchanged. IMPRESSION: Bibasilar consolidation asymmetric greater on the left appears similar or  slightly worse when compared to prior exam. Central pulmonary vascular prominence. Electronically Signed   By: Lacy DuverneySteven  Olson M.D.   On: 03/06/2016 07:31   Dg Chest Portable 1 View  Result Date: 03/05/2016 CLINICAL DATA:  Intubated EXAM: PORTABLE CHEST 1 VIEW COMPARISON:  03/05/2016 10 o'clock hour FINDINGS: Endotracheal tube placed with its tip 4.7 cm from the carina. NG tube placed with its tip in the fundus. Normal heart size. Hazy bilateral central and basilar consolidation is stable. No pneumothorax. IMPRESSION: Endotracheal and NG tubes placed. Stable bilateral airspace disease. Electronically Signed   By: Jolaine ClickArthur  Hoss M.D.   On: 03/05/2016 12:47   Dg Chest Portable 1 View  Result Date: 03/05/2016 CLINICAL DATA:  Altered mental status, fever EXAM: PORTABLE CHEST 1 VIEW COMPARISON:  02/05/2016 FINDINGS: Heart is upper limits normal in size. There are bilateral lower lobe airspace opacities, increasing in the right base since prior study. No visible effusions or acute bony abnormality. Degenerative changes in the shoulders. IMPRESSION: Bibasilar opacities, increasing on the right since prior study concerning for pneumonia. Electronically Signed   By: Charlett NoseKevin  Dover M.D.   On: 03/05/2016 10:49   Dg Swallowing Func-speech Pathology  Result Date: 03/12/2016 Objective Swallowing Evaluation: Type of Study: MBS-Modified Barium Swallow Study Patient Details Name: Sheila Amassrrie L Livingood MRN: [id number] Date of Birth: [birth year] Today's Date: 03/12/2016 Time: SLP Start Time (ACUTE ONLY): 1056-SLP Stop Time (ACUTE ONLY): 1120 SLP Time Calculation (min) (ACUTE ONLY): 24 min Past Medical History: Past Medical History: Diagnosis Date . Altered mental status 12/09/2012 . Arthritis   "all over" (12/09/2012) . Chronic bronchitis (HCC)   "couple times/yr" (12/09/2012) . Chronic lower back pain  . COPD (chronic obstructive pulmonary disease) (HCC)  . Diabetes mellitus without complication (HCC)  . Exertional shortness of breath  .  GERD (gastroesophageal reflux disease)   rolaids if needed . Gout, unspecified  . Hepatitis, unspecified   "the one that's not bad" (12/09/2012) . History of blood transfusion   "related to a surgery, I think" (12/09/2012) . Hyperlipidemia  . Hypertension  . Migraines   "in the past" (12/09/2012) . Osteoarthrosis, unspecified whether generalized or localized, unspecified site  . Other chronic sinusitis  . Pneumonia   "couple times; long time ago" (12/09/2012) . Unspecified chronic bronchitis   sees Dr. Melba Coon. Cisneros, last visit 08/2011, treated for E. Coli- resp. /w Ceftin, Feb. 2013 Past Surgical History: Past Surgical History: Procedure Laterality Date . ABDOMINAL HYSTERECTOMY    "w/right ovary" (12/09/2012) . APPENDECTOMY   . BREAST BIOPSY Right  . CARPAL TUNNEL RELEASE  09/06/2011  Procedure: CARPAL TUNNEL RELEASE;  Surgeon: Sheila Rad, MD;  Location: [facility] OR;  Service: Orthopedics;  Laterality: Right; . ESOPHAGOGASTRODUODENOSCOPY N/A 06/04/2013  Procedure: ESOPHAGOGASTRODUODENOSCOPY (EGD);  Surgeon: Sheila Shiver, MD;  Location: [facility] ENDOSCOPY;  Service: Endoscopy;  Laterality: N/A; . SHOULDER ARTHROSCOPY W/ ROTATOR CUFF REPAIR Right  . TONSILLECTOMY   . TUBAL LIGATION   HPI: 75 year old female with PMH of GERD, Dementia, Type 2 DM, COPD, HTN, recurrent PNA and Hyperlipidemia. Pt had recent admission for PNA from 10/22-10/26 and presented for this admission with PNA. Intubated on 11/20 and extubated on 11/26. CXR show increasing left basilar infiltrate.  Subjective: pt alert, participatory Assessment / Plan / Recommendation CHL IP CLINICAL IMPRESSIONS 03/12/2016 Therapy Diagnosis Severe pharyngeal phase dysphagia;Mild oral phase dysphagia Clinical Impression Pt presents with mild oral and severe pharyngeal dysphagia. She had increasing lingual residue as PO trials were given. Oral holding was displayed during trials of pureed solids and Mod cues were needed to initiate a swallow; however, pt's airway remained protected. Pt  presented with a delay in swallow initiation to the valleculae, reduced airway closure and pharyngeal peristalsis which resulted in events of silent penetration/aspiration with thin and nectar thick liquid trials. Despite Mod cueing, pt was unable to perform compensatory startegies to aid in airway protection (i.e. chin tuck, cued cough). Vallecular residue was observed throughout MBS that was minimized when pt spontaneously had additional swallows. Pt had a coordinated and timely swallow with honey thick liquids but vallecular residuals were silently penetrated and ultimately aspirated following the swallow. Although pt had reduced sensation she did reflexively cough following aspiration of honey thick liquids. Recommend pt remain NPO except for medications crushed in puree because of the increased time and cueing needed to eliminate oral holding observed with puree consistencies. Per chart review, pt's recent barium swallow (02/09/16) showed no events of aspiration, thus her performance on MBS may likely be due to her prolonged intubation which means there is potential for improvement over the next few days. Will continue to follow acutely and administer PO trials at bedside. Impact on safety and function Severe aspiration risk   CHL IP TREATMENT RECOMMENDATION 03/12/2016 Treatment Recommendations Therapy as outlined in treatment plan below   Prognosis 03/12/2016 Prognosis for Safe Diet Advancement Good Barriers to Reach Goals Cognitive deficits;Severity of deficits Barriers/Prognosis Comment -- CHL IP DIET RECOMMENDATION 03/12/2016 SLP Diet Recommendations NPO except meds Liquid Administration via -- Medication Administration Crushed with puree Compensations -- Postural Changes Seated upright at 90 degrees   CHL IP OTHER RECOMMENDATIONS 03/12/2016 Recommended Consults -- Oral Care Recommendations Oral care QID Other Recommendations --   CHL IP FOLLOW UP RECOMMENDATIONS 03/12/2016 Follow up Recommendations Other  (comment)   CHL IP FREQUENCY AND DURATION 03/12/2016 Speech Therapy Frequency (ACUTE ONLY) min 2x/week Treatment Duration 2 weeks      CHL IP ORAL PHASE 03/12/2016 Oral Phase Impaired Oral - Pudding Teaspoon -- Oral - Pudding Cup -- Oral - Honey Teaspoon -- Oral - Honey Cup Lingual/palatal residue;Delayed oral transit Oral - Nectar Teaspoon -- Oral - Nectar Cup Lingual/palatal residue Oral - Nectar Straw Lingual/palatal residue Oral - Thin Teaspoon Lingual/palatal residue Oral - Thin Cup Lingual/palatal residue Oral - Thin Straw -- Oral - Puree Lingual/palatal residue;Delayed oral transit;Holding of bolus Oral - Mech Soft -- Oral - Regular -- Oral - Multi-Consistency -- Oral - Pill -- Oral Phase - Comment --  CHL IP PHARYNGEAL PHASE 03/12/2016 Pharyngeal Phase Impaired Pharyngeal- Pudding Teaspoon -- Pharyngeal --  Pharyngeal- Pudding Cup -- Pharyngeal -- Pharyngeal- Honey Teaspoon -- Pharyngeal -- Pharyngeal- Honey Cup Delayed swallow initiation-vallecula;Pharyngeal residue - valleculae;Reduced airway/laryngeal closure;Reduced pharyngeal peristalsis;Penetration/Apiration after swallow Pharyngeal Material enters airway, remains ABOVE vocal cords and not ejected out;Material enters airway, passes BELOW cords without attempt by patient to eject out (silent aspiration) Pharyngeal- Nectar Teaspoon -- Pharyngeal -- Pharyngeal- Nectar Cup Delayed swallow initiation-vallecula;Reduced airway/laryngeal closure;Penetration/Aspiration before swallow;Pharyngeal residue - valleculae;Reduced pharyngeal peristalsis Pharyngeal Material enters airway, remains ABOVE vocal cords and not ejected out Pharyngeal- Nectar Straw Delayed swallow initiation-vallecula;Penetration/Aspiration before swallow;Reduced pharyngeal peristalsis;Reduced airway/laryngeal closure;Pharyngeal residue - valleculae;Compensatory strategies attempted (with notebox) Pharyngeal Material enters airway, remains ABOVE vocal cords and not ejected out;Material enters  airway, passes BELOW cords without attempt by patient to eject out (silent aspiration) Pharyngeal- Thin Teaspoon Delayed swallow initiation-vallecula;Reduced airway/laryngeal closure;Reduced pharyngeal peristalsis;Pharyngeal residue - valleculae;Penetration/Aspiration before swallow Pharyngeal Material enters airway, remains ABOVE vocal cords then ejected out Pharyngeal- Thin Cup Delayed swallow initiation-vallecula;Reduced pharyngeal peristalsis;Reduced airway/laryngeal closure;Penetration/Aspiration before swallow;Pharyngeal residue - valleculae Pharyngeal Material enters airway, passes BELOW cords without attempt by patient to eject out (silent aspiration) Pharyngeal- Thin Straw -- Pharyngeal -- Pharyngeal- Puree Delayed swallow initiation-vallecula;Reduced pharyngeal peristalsis;Reduced airway/laryngeal closure Pharyngeal -- Pharyngeal- Mechanical Soft -- Pharyngeal -- Pharyngeal- Regular -- Pharyngeal -- Pharyngeal- Multi-consistency -- Pharyngeal -- Pharyngeal- Pill -- Pharyngeal -- Pharyngeal Comment --  CHL IP CERVICAL ESOPHAGEAL PHASE 03/12/2016 Cervical Esophageal Phase WFL Pudding Teaspoon -- Pudding Cup -- Honey Teaspoon -- Honey Cup -- Nectar Teaspoon -- Nectar Cup -- Nectar Straw -- Thin Teaspoon -- Thin Cup -- Thin Straw -- Puree -- Mechanical Soft -- Regular -- Multi-consistency -- Pill -- Cervical Esophageal Comment -- No flowsheet data found. Sheila Cisneros 03/12/2016, 3:17 PM  Note populated for Sheila Cisneros, student SLP Sheila Cisneros, M.A. CCC-SLP [phone]              Labs:  CBC:  Recent Labs  03/12/16 0226 03/13/16 0619 03/14/16 0634 03/15/16 0416  WBC 10.9* 10.5 13.5* 11.9*  HGB 9.0* 12.6 10.9* 11.0*  HCT 29.0* 42.4 33.6* 34.1*  PLT 398 341 458* 466*    COAGS:  Recent Labs  03/16/16 0927  INR 1.23    BMP:  Recent Labs  03/12/16 0226 03/13/16 0619 03/14/16 0634 03/15/16 0416  NA 140 138 136 138  K 3.4* 3.4* 5.2* 3.4*  CL 105 100* 103 105  CO2 25  25 22 23   GLUCOSE 120* 137* 125* 93  BUN 15 9 9 8   CALCIUM 9.3 10.3 9.7 9.9  CREATININE 0.92 0.77 1.03* 0.97  GFRNONAA 60* >60 52* 56*  GFRAA >60 >60 >60 >60    LIVER FUNCTION TESTS:  Recent Labs  03/23/15 1432 02/05/16 1818 02/06/16 0224 02/08/16 0523 02/09/16 0527 03/05/16 1133  BILITOT 0.3 0.5 0.5  --   --  0.4  AST 28 25 22   --   --  21  ALT 26 16 12*  --   --  21  ALKPHOS 163* 119 90  --   --  100  PROT 8.0 7.7 6.1*  --   --  6.0*  ALBUMIN 3.8 3.1* 2.4* 2.5* 2.3* 1.9*    TUMOR MARKERS: No results for input(s): AFPTM, CEA, CA199, CHROMGRNA in the last 8760 hours.  Assessment and Plan:  Percutaneous gastrostomy tube placement Patient with history of aspiration PNA and dementia presented to [facility] with AMS and was found to have recurrent PNA.  Patient's PNA has improved, however her overall medical condition and goals  of care were discussed between HPOA Sheila Cisneros) and Palliative Medicine Team.  HPOA feels patient would benefit from nutrition support and gastric feeding tube.  IR was consulted for percutaneous gastrostomy tube placement. Imaging review by Dr. Loreta Ave.  Risks and benefits discussed with the patient's healthcare power of attorney (Sheila Cisneros) including, but not limited to the need for a barium enema during the procedure, bleeding, infection, peritonitis, or damage to adjacent structures. All of the patient's HPOA questions were answered, patient's HPOA is agreeable to proceed. Consent signed and in chart.  Patient will be scheduled for percutaneous gastrostomy tube placement early next week.  Anticipate procedure Monday if patient remains NPO and SQ heparin held appropriately.  Orders placed.   Thank you for this interesting consult.  I greatly enjoyed meeting Sheila Cisneros and look forward to participating in their care.  A copy of this report was sent to the requesting provider on this date.  Electronically Signed: Hoyt Koch 03/16/2016, 4:38 PM   I spent a total of 20 Minutes in face to face in clinical consultation, greater than 50% of which was counseling/coordinating care for percutaneous gastrostomy tube placement.

## 2016-03-16 NOTE — Progress Notes (Signed)
[facility] PROGRESS NOTE  Sheila Cisneros WUJ:[id number] DOB: 1940-08-21 DOA: 03/05/2016  PCP: Sanda Lingerhomas Jones, MD  Brief History/Interval Summary: 75 year old African-American female with a past medical history of dementia, GERD, type 2 diabetes, COPD, hypertension, chronic low back pain, hepatitis presented from an assisted living facility. At baseline, patient is nonverbal and has had multiple aspiration events in past. She was found to be in acute respiratory failure. She had to be intubated in the emergency department and was admitted to the ICU. She was stabilized, extubated and then transferred to the floor. Dysphagia is the main active issue. Patient seen by palliative medicine. They have discussed with power of attorney. Power of attorney wants to proceed with feeding tube.  Reason for Visit: Aspiration pneumonia  Consultants: Palliative medicine. Interventional radiology  Procedures: Intubation  Antibiotics: Completed course of antibiotics. She was on the following at various times during this hospital stay: Unasyn, cefepime and vancomycin  Subjective/Interval History: Patient much more talkative this morning compared to before. Still remains confused and distracted.  ROS: Unable to do due to her dementia  Objective:  Vital Signs  Vitals:   03/15/16 2117 03/15/16 2121 03/15/16 2140 03/16/16 0330  BP:   (!) 147/96 (!) 150/74  Pulse:   (!) 115 96  Resp:   18 18  Temp:   98.7 F (37.1 C) 98.5 F (36.9 C)  TempSrc:    Oral  SpO2: 95% 95% 100% 100%  Weight:    47.9 kg (105 lb 11.2 oz)  Height:        Intake/Output Summary (Last 24 hours) at 03/16/16 1050 Last data filed at 03/16/16 0900  Gross per 24 hour  Intake              550 ml  Output                0 ml  Net              550 ml   Filed Weights   03/14/16 0500 03/15/16 0243 03/16/16 0330  Weight: 46.3 kg (102 lb 1.6 oz) 46.7 kg (102 lb 14.4 oz) 47.9 kg (105 lb 11.2 oz)    General appearance: alert  and Distracted.  Resp: Diminished air entry at the bases. No crackles, wheezing or rhonchi. Cardio: regular rate and rhythm, S1, S2 normal, no murmur, click, rub or gallop GI: soft, non-tender; bowel sounds normal; no masses,  no organomegaly Neurologic: Awake. Not cooperative with examination. Moves some extremities at times.  Lab Results:  Data Reviewed: I have personally reviewed following labs and imaging studies  CBC:  Recent Labs Lab 03/11/16 0512 03/12/16 0226 03/13/16 0619 03/14/16 0634 03/15/16 0416  WBC 12.6* 10.9* 10.5 13.5* 11.9*  HGB 8.7* 9.0* 12.6 10.9* 11.0*  HCT 27.8* 29.0* 42.4 33.6* 34.1*  MCV 87.1 87.6 86.2 86.4 85.7  PLT 345 398 341 458* 466*    Basic Metabolic Panel:  Recent Labs Lab 03/11/16 0245 03/12/16 0226 03/13/16 0619 03/14/16 0634 03/15/16 0416  NA 142 140 138 136 138  K 3.7 3.4* 3.4* 5.2* 3.4*  CL 108 105 100* 103 105  CO2 23 25 25 22 23   GLUCOSE 51* 120* 137* 125* 93  BUN 20 15 9 9 8   CREATININE 0.97 0.92 0.77 1.03* 0.97  CALCIUM 9.1 9.3 10.3 9.7 9.9  MG 1.9 1.6* 1.9 1.7 1.6*  PHOS 4.1 4.3 3.4 3.3 3.6    GFR: Estimated Creatinine Clearance: 38.5 mL/min (by C-G formula based  on SCr of 0.97 mg/dL).  CBG:  Recent Labs Lab 03/15/16 1747 03/15/16 2020 03/15/16 2347 03/16/16 0334 03/16/16 0823  GLUCAP 143* 132* 160* 105* 122*     Recent Results (from the past 240 hour(s))  Culture, respiratory (NON-Expectorated)     Status: None   Collection Time: 03/07/16 11:45 AM  Result Value Ref Range Status   Specimen Description TRACHEAL ASPIRATE  Final   Special Requests NONE  Final   Gram Stain   Final    ABUNDANT WBC PRESENT, PREDOMINANTLY PMN RARE YEAST    Culture FEW METHICILLIN RESISTANT STAPHYLOCOCCUS AUREUS  Final   Report Status 03/10/2016 FINAL  Final   Organism ID, Bacteria METHICILLIN RESISTANT STAPHYLOCOCCUS AUREUS  Final      Susceptibility   Methicillin resistant staphylococcus aureus - MIC*    CIPROFLOXACIN  >=8 RESISTANT Resistant     ERYTHROMYCIN >=8 RESISTANT Resistant     GENTAMICIN <=0.5 SENSITIVE Sensitive     OXACILLIN >=4 RESISTANT Resistant     TETRACYCLINE >=16 RESISTANT Resistant     VANCOMYCIN 1 SENSITIVE Sensitive     TRIMETH/SULFA <=10 SENSITIVE Sensitive     CLINDAMYCIN >=8 RESISTANT Resistant     RIFAMPIN <=0.5 SENSITIVE Sensitive     Inducible Clindamycin NEGATIVE Sensitive     * FEW METHICILLIN RESISTANT STAPHYLOCOCCUS AUREUS      Radiology Studies: Ct Abdomen Wo Contrast  Result Date: 03/16/2016 CLINICAL DATA:  Gastrostomy tube evaluation EXAM: CT ABDOMEN AND PELVIS WITHOUT CONTRAST TECHNIQUE: Multidetector CT imaging of the abdomen and pelvis was performed following the standard protocol without IV contrast. COMPARISON:  06/04/2013 FINDINGS: Lower chest: There is masslike consolidation at the left lung base. There is an associated pleural effusion which appears loculated. The masslike area in the left lower lobe measures 4.0 x 4.7 cm on image 6. It is partially imaged. Compare to a CT chest dated 08/18/2015, previously, this abnormality at the appearance of acute consolidation. On today's study, there are areas of low density without air bronchograms. This is atypical with respect to an inflammatory process in raises suspicion of other etiology suggest malignancy. Subsegmental atelectasis at the right lung base. Hepatobiliary: Stable small hypodensity in the right lobe of the liver, measuring 1.4 cm. Gallbladder contains small stones. Pancreas: Atrophic. Spleen: Unremarkable. Adrenals/Urinary Tract: No hydronephrosis or obvious mass. Cortical calcification along the g of the right kidney is stable and nonspecific. Adrenal glands are within normal limits. Stomach/Bowel: The body of the stomach is positioned between the left lobe of the liver and transverse colon. It is opposed to the anterior abdominal wall. Vascular/Lymphatic: No evidence of aortic aneurysm. No obvious adenopathy.  Other: No free-fluid. Musculoskeletal: Advanced levoscoliosis with the apex at L3-4. There is severe degenerative disc disease at L3-4 and L4-5. No definite acute compression deformity. Degenerative disc disease also occurs in the lower thoracic spine. IMPRESSION: There is favorable anatomy for gastrostomy tube placement as described above Masslike consolidation at the left lung base. This abnormality is partially imaged and measures up to 4.7 cm. Differential diagnosis includes an inflammatory process, round atelectasis, or malignancy. Consider PET-CT for further characterization. Cholelithiasis. Electronically Signed   By: Jolaine Click M.D.   On: 03/16/2016 08:26     Medications:  Scheduled: . budesonide (PULMICORT) nebulizer solution  0.5 mg Nebulization BID  . heparin  5,000 Units Subcutaneous Q8H  . insulin aspart  0-9 Units Subcutaneous Q4H  . ipratropium-albuterol  3 mL Nebulization BID  . mouth rinse  15 mL Mouth  Rinse BID  . venlafaxine  37.5 mg Oral Q breakfast   Continuous: . dextrose 5 % and 0.45% NaCl 1,000 mL infusion 50 mL/hr at 03/16/16 1044   WUJ:[id number]:sodium chloride, ipratropium-albuterol  Assessment/Plan:  Active Problems:   Aspiration pneumonia (HCC)   Acute respiratory failure with hypoxia (HCC)   AKI (acute kidney injury) (HCC)   Pressure injury of skin   Acute on chronic respiratory failure with hypoxia (HCC)   Pneumonia of both lower lobes due to methicillin resistant Staphylococcus aureus (MRSA) (HCC)   Panlobular emphysema (HCC)   Acute pulmonary edema (HCC)   Chronic diastolic CHF (congestive heart failure) (HCC)   Acute kidney injury (HCC)   Oropharyngeal dysphagia   Aspiration into airway   Palliative care encounter   Goals of care, counseling/discussion    Acute on Chronic Respiratory Failure with hypoxia/ positive MRSA in sputum/Aspiration PNA Her presentation was most likely secondary to aspiration, although she was found to have MRSA in sputum. She  has completed 10 days of treatment with vancomycin. Patient expected to continue to have recurrent aspiration pneumonia. Currently, appears to be stable from a respiratory standpoint.  Dysphagia (Multiple aspiration events)  Patient failed swallow study on 11/28. Nothing by mouth for now. Palliative medicine was consulted. Palliative medicines discussed with patient's power of attorney. After long discussions over the phone power of attorney mentioning that he wants everything done for the patient based on her previous wishes. In view of this interventional radiology was consulted for PEG tube placement, even though this is not ideal for this patient. Would not feed her orally due to high aspiration risk. Await PEG tube placement.  Abnormal density in the left lung base Incidentally noted on CT abdomen. Most likely this is an inflammatory process. Patient does have a history of bronchiectasis. Similar, although less severe findings, noted on previous imaging studies including a CT scan from May 2017. These findings are thought to be secondary to chronic aspiration. At this time, we will not pursue any further investigations. This can be further evaluated as an outpatient, although patient's poor long-term prognosis argues against further evaluation.  History of COPD, Bronchiectasis Stable. Continue Pulmicort. Continue nebulizer treatments.   Pulm edema.  Resolved  S/P hypotension 2/2 hypovoplemia and sepsis Resolved  Chronic diastolic CHF Stable. Strict ins and outs, daily weights.  Hypokalemia Was repleted.  AKI  Thought to be secondary to hypokalemia. Now resolved.  DM Type 2 controlled with complications Sensitive SSI  Dementia/Encephalopathy secondary to PNA;  Possibly at baseline now.  DVT Prophylaxis: Heparin subcutaneously    Code Status: Full code for now  Family Communication: No family at bedside. There is a nephew who is the power of attorney. Disposition Plan:  Healthcare power of attorney is elected to pursue PEG tube placement. Interventional radiology consulted.    LOS: 11 days   [facility] HospitalKRISHNAN,Timisha Mondry  [facility] Pager [phone] 03/16/2016, 10:50 AM  If 7PM-7AM, please contact night-coverage at [email or web address], password [email or web address]

## 2016-03-17 DIAGNOSIS — E876 Hypokalemia: Secondary | ICD-10-CM

## 2016-03-17 LAB — CBC
HEMATOCRIT: 32.3 % — AB (ref 36.0–46.0)
Hemoglobin: 10.4 g/dL — ABNORMAL LOW (ref 12.0–15.0)
MCH: 27.5 pg (ref 26.0–34.0)
MCHC: 32.2 g/dL (ref 30.0–36.0)
MCV: 85.4 fL (ref 78.0–100.0)
Platelets: 506 10*3/uL — ABNORMAL HIGH (ref 150–400)
RBC: 3.78 MIL/uL — AB (ref 3.87–5.11)
RDW: 15.7 % — AB (ref 11.5–15.5)
WBC: 11.4 10*3/uL — AB (ref 4.0–10.5)

## 2016-03-17 LAB — BASIC METABOLIC PANEL
ANION GAP: 9 (ref 5–15)
Anion gap: 9 (ref 5–15)
BUN: 7 mg/dL (ref 6–20)
BUN: 6 mg/dL (ref 6–20)
CALCIUM: 10.2 mg/dL (ref 8.9–10.3)
CHLORIDE: 107 mmol/L (ref 101–111)
CHLORIDE: 109 mmol/L (ref 101–111)
CO2: 23 mmol/L (ref 22–32)
CO2: 20 mmol/L — AB (ref 22–32)
CREATININE: 1.02 mg/dL — AB (ref 0.44–1.00)
Calcium: 10 mg/dL (ref 8.9–10.3)
Creatinine, Ser: 1.09 mg/dL — ABNORMAL HIGH (ref 0.44–1.00)
GFR calc non Af Amer: 53 mL/min — ABNORMAL LOW (ref >60)
GFR, EST AFRICAN AMERICAN: 57 mL/min — AB (ref >60)
GFR, EST NON AFRICAN AMERICAN: 49 mL/min — AB (ref >60)
Glucose, Bld: 48 mg/dL — ABNORMAL LOW (ref 65–99)
Glucose, Bld: 114 mg/dL — ABNORMAL HIGH (ref 65–99)
POTASSIUM: 2.8 mmol/L — AB (ref 3.5–5.1)
Potassium: 3.3 mmol/L — ABNORMAL LOW (ref 3.5–5.1)
SODIUM: 139 mmol/L (ref 135–145)
SODIUM: 138 mmol/L (ref 135–145)

## 2016-03-17 LAB — GLUCOSE, CAPILLARY
GLUCOSE-CAPILLARY: 170 mg/dL — AB (ref 65–99)
GLUCOSE-CAPILLARY: 204 mg/dL — AB (ref 65–99)
GLUCOSE-CAPILLARY: 50 mg/dL — AB (ref 65–99)
Glucose-Capillary: 117 mg/dL — ABNORMAL HIGH (ref 65–99)
Glucose-Capillary: 98 mg/dL (ref 65–99)
Glucose-Capillary: 122 mg/dL — ABNORMAL HIGH (ref 65–99)
Glucose-Capillary: 124 mg/dL — ABNORMAL HIGH (ref 65–99)

## 2016-03-17 LAB — MAGNESIUM: Magnesium: 1.5 mg/dL — ABNORMAL LOW (ref 1.7–2.4)

## 2016-03-17 MED ORDER — SODIUM CHLORIDE 0.9 % IV SOLN
30.0000 meq | Freq: Once | INTRAVENOUS | Status: AC
  Administered 2016-03-17: 30 meq via INTRAVENOUS
  Filled 2016-03-17 (×2): qty 15

## 2016-03-17 MED ORDER — SODIUM CHLORIDE 0.9 % IV SOLN
30.0000 meq | Freq: Once | INTRAVENOUS | Status: AC
  Administered 2016-03-17: 30 meq via INTRAVENOUS
  Filled 2016-03-17: qty 15

## 2016-03-17 MED ORDER — MAGNESIUM SULFATE 2 GM/50ML IV SOLN
2.0000 g | Freq: Once | INTRAVENOUS | Status: AC
  Administered 2016-03-17: 2 g via INTRAVENOUS
  Filled 2016-03-17: qty 50

## 2016-03-17 MED ORDER — DEXTROSE 50 % IV SOLN
INTRAVENOUS | Status: AC
  Administered 2016-03-17: 50 mL
  Filled 2016-03-17: qty 50

## 2016-03-17 MED ORDER — HYDRALAZINE HCL 20 MG/ML IJ SOLN
5.0000 mg | Freq: Four times a day (QID) | INTRAMUSCULAR | Status: DC | PRN
  Administered 2016-03-17: 5 mg via INTRAVENOUS
  Filled 2016-03-17: qty 1

## 2016-03-17 NOTE — Progress Notes (Signed)
[facility] PROGRESS NOTE  Sheila Cisneros MVH:[id number] DOB: [birth year] DOA: 03/05/2016  PCP: Sanda Lingerhomas Jones, MD  Brief History/Interval Summary: 75 year old African-American female with a past medical history of dementia, GERD, type 2 diabetes, COPD, hypertension, chronic low back pain, hepatitis presented from an assisted living facility. At baseline, patient is nonverbal and has had multiple aspiration events in past. She was found to be in acute respiratory failure. She had to be intubated in the emergency department and was admitted to the ICU. She was stabilized, extubated and then transferred to the floor. Dysphagia is the main active issue. Patient seen by palliative medicine. They have discussed with power of attorney. Power of attorney wants to proceed with feeding tube.  Reason for Visit: Aspiration pneumonia  Consultants: Palliative medicine. Interventional radiology  Procedures: Intubation  Antibiotics: Completed course of antibiotics. She was on the following at various times during this hospital stay: Unasyn, cefepime and vancomycin  Subjective/Interval History: Patient remains confused and distracted.   ROS: Unable to do due to her dementia  Objective:  Vital Signs  Vitals:   03/16/16 2218 03/17/16 0316 03/17/16 0324 03/17/16 0500  BP: (!) 183/96 (!) 167/81 (!) 147/79 (!) 162/86  Pulse: (!) 101 78 72 77  Resp: 18     Temp: 97.9 F (36.6 C)   97.9 F (36.6 C)  TempSrc: Oral   Oral  SpO2: 100%   100%  Weight:    49.8 kg (109 lb 12.8 oz)  Height:        Intake/Output Summary (Last 24 hours) at 03/17/16 0917 Last data filed at 03/17/16 0659  Gross per 24 hour  Intake              600 ml  Output                0 ml  Net              600 ml   Filed Weights   03/15/16 0243 03/16/16 0330 03/17/16 0500  Weight: 46.7 kg (102 lb 14.4 oz) 47.9 kg (105 lb 11.2 oz) 49.8 kg (109 lb 12.8 oz)    General appearance: alert and Distracted.  Resp: Diminished  air entry at the bases. No crackles, wheezing or rhonchi. Cardio: regular rate and rhythm, S1, S2 normal, no murmur, click, rub or gallop GI: soft, non-tender; bowel sounds normal; no masses,  no organomegaly Neurologic: Awake. Not cooperative with examination. Moves all extremities.  Lab Results:  Data Reviewed: I have personally reviewed following labs and imaging studies  CBC:  Recent Labs Lab 03/12/16 0226 03/13/16 0619 03/14/16 0634 03/15/16 0416 03/17/16 0421  WBC 10.9* 10.5 13.5* 11.9* 11.4*  HGB 9.0* 12.6 10.9* 11.0* 10.4*  HCT 29.0* 42.4 33.6* 34.1* 32.3*  MCV 87.6 86.2 86.4 85.7 85.4  PLT 398 341 458* 466* 506*    Basic Metabolic Panel:  Recent Labs Lab 03/11/16 0245 03/12/16 0226 03/13/16 0619 03/14/16 0634 03/15/16 0416 03/17/16 0421  NA 142 140 138 136 138 139  K 3.7 3.4* 3.4* 5.2* 3.4* 2.8*  CL 108 105 100* 103 105 107  CO2 23 25 25 22 23 23   GLUCOSE 51* 120* 137* 125* 93 48*  BUN 20 15 9 9 8 7   CREATININE 0.97 0.92 0.77 1.03* 0.97 1.09*  CALCIUM 9.1 9.3 10.3 9.7 9.9 10.0  MG 1.9 1.6* 1.9 1.7 1.6*  --   PHOS 4.1 4.3 3.4 3.3 3.6  --     GFR: Estimated  Creatinine Clearance: 35.6 mL/min (by C-G formula based on SCr of 1.09 mg/dL (H)).  CBG:  Recent Labs Lab 03/16/16 2041 03/17/16 0035 03/17/16 0434 03/17/16 0504 03/17/16 0811  GLUCAP 129* 170* 50* 204* 117*     Recent Results (from the past 240 hour(s))  Culture, respiratory (NON-Expectorated)     Status: None   Collection Time: 03/07/16 11:45 AM  Result Value Ref Range Status   Specimen Description TRACHEAL ASPIRATE  Final   Special Requests NONE  Final   Gram Stain   Final    ABUNDANT WBC PRESENT, PREDOMINANTLY PMN RARE YEAST    Culture FEW METHICILLIN RESISTANT STAPHYLOCOCCUS AUREUS  Final   Report Status 03/10/2016 FINAL  Final   Organism ID, Bacteria METHICILLIN RESISTANT STAPHYLOCOCCUS AUREUS  Final      Susceptibility   Methicillin resistant staphylococcus aureus - MIC*     CIPROFLOXACIN >=8 RESISTANT Resistant     ERYTHROMYCIN >=8 RESISTANT Resistant     GENTAMICIN <=0.5 SENSITIVE Sensitive     OXACILLIN >=4 RESISTANT Resistant     TETRACYCLINE >=16 RESISTANT Resistant     VANCOMYCIN 1 SENSITIVE Sensitive     TRIMETH/SULFA <=10 SENSITIVE Sensitive     CLINDAMYCIN >=8 RESISTANT Resistant     RIFAMPIN <=0.5 SENSITIVE Sensitive     Inducible Clindamycin NEGATIVE Sensitive     * FEW METHICILLIN RESISTANT STAPHYLOCOCCUS AUREUS      Radiology Studies: Ct Abdomen Wo Contrast  Result Date: 03/16/2016 CLINICAL DATA:  Gastrostomy tube evaluation EXAM: CT ABDOMEN AND PELVIS WITHOUT CONTRAST TECHNIQUE: Multidetector CT imaging of the abdomen and pelvis was performed following the standard protocol without IV contrast. COMPARISON:  06/04/2013 FINDINGS: Lower chest: There is masslike consolidation at the left lung base. There is an associated pleural effusion which appears loculated. The masslike area in the left lower lobe measures 4.0 x 4.7 cm on image 6. It is partially imaged. Compare to a CT chest dated 08/18/2015, previously, this abnormality at the appearance of acute consolidation. On today's study, there are areas of low density without air bronchograms. This is atypical with respect to an inflammatory process in raises suspicion of other etiology suggest malignancy. Subsegmental atelectasis at the right lung base. Hepatobiliary: Stable small hypodensity in the right lobe of the liver, measuring 1.4 cm. Gallbladder contains small stones. Pancreas: Atrophic. Spleen: Unremarkable. Adrenals/Urinary Tract: No hydronephrosis or obvious mass. Cortical calcification along the g of the right kidney is stable and nonspecific. Adrenal glands are within normal limits. Stomach/Bowel: The body of the stomach is positioned between the left lobe of the liver and transverse colon. It is opposed to the anterior abdominal wall. Vascular/Lymphatic: No evidence of aortic aneurysm. No obvious  adenopathy. Other: No free-fluid. Musculoskeletal: Advanced levoscoliosis with the apex at L3-4. There is severe degenerative disc disease at L3-4 and L4-5. No definite acute compression deformity. Degenerative disc disease also occurs in the lower thoracic spine. IMPRESSION: There is favorable anatomy for gastrostomy tube placement as described above Masslike consolidation at the left lung base. This abnormality is partially imaged and measures up to 4.7 cm. Differential diagnosis includes an inflammatory process, round atelectasis, or malignancy. Consider PET-CT for further characterization. Cholelithiasis. Electronically Signed   By: Jolaine ClickArthur  Hoss M.D.   On: 03/16/2016 08:26     Medications:  Scheduled: . budesonide (PULMICORT) nebulizer solution  0.5 mg Nebulization BID  . heparin  5,000 Units Subcutaneous Q8H  . insulin aspart  0-9 Units Subcutaneous Q4H  . mouth rinse  15 mL  Mouth Rinse BID  . [START ON 03/19/2016] vancomycin  1,000 mg Intravenous Once  . venlafaxine  37.5 mg Oral Q breakfast   Continuous: . dextrose 5 % and 0.45% NaCl 1,000 mL infusion 50 mL/hr at 03/17/16 0906   ZOX:WRUEAV chloride, ipratropium-albuterol  Assessment/Plan:  Active Problems:   Aspiration pneumonia (HCC)   Acute respiratory failure with hypoxia (HCC)   AKI (acute kidney injury) (HCC)   Pressure injury of skin   Acute on chronic respiratory failure with hypoxia (HCC)   Pneumonia of both lower lobes due to methicillin resistant Staphylococcus aureus (MRSA) (HCC)   Panlobular emphysema (HCC)   Acute pulmonary edema (HCC)   Chronic diastolic CHF (congestive heart failure) (HCC)   Acute kidney injury (HCC)   Oropharyngeal dysphagia   Aspiration into airway   Palliative care encounter   Goals of care, counseling/discussion    Acute on Chronic Respiratory Failure with hypoxia/ positive MRSA in sputum/Aspiration PNA Her presentation was most likely secondary to aspiration, although she was found to  have MRSA in sputum. She has completed 10 days of treatment with vancomycin. Patient expected to continue to have recurrent aspiration pneumonia. Currently, appears to be stable from a respiratory standpoint.  Dysphagia (Multiple aspiration events)  Patient failed swallow study on 11/28. Nothing by mouth for now. Palliative medicine was consulted. Palliative medicine discussed with patient's power of attorney. After long discussions over the phone power of attorney mentioning that he wants everything done for the patient based on her previous wishes. In view of this interventional radiology was consulted for PEG tube placement, even though this is not ideal for this patient. Would not feed her orally due to high aspiration risk. Await PEG tube placement.  Abnormal density in the left lung base Incidentally noted on CT abdomen. Most likely this is an inflammatory process. Patient does have a history of bronchiectasis. Similar, although less severe findings, noted on previous imaging studies including a CT scan from Aug 24, 2015. These findings are thought to be secondary to chronic aspiration. At this time, we will not pursue any further investigations. This can be further evaluated as an outpatient, although patient's poor long-term prognosis argues against further evaluation.  History of COPD, Bronchiectasis Stable. Continue Pulmicort. Continue nebulizer treatments.   Pulm edema.  Resolved  S/P hypotension 2/2 hypovoplemia and sepsis Resolved  Chronic diastolic CHF Stable. Strict ins and outs, daily weights.  Hypokalemia Potassium noted to be low again today. Also noted to have hypomagnesemia. These will be repleted. Repeat labs later today and tomorrow.  AKI  Thought to be secondary to hypovolemia. Now resolved.  DM Type 2 controlled with complications Noted to have hypoglycemic episode this morning. Patient is supposed be on a D5 infusion, which she does not appear to be getting.  Discussed with nurse.  Dementia/Encephalopathy secondary to PNA;  Possibly at baseline now.  DVT Prophylaxis: Heparin subcutaneously    Code Status: Full code for now  Family Communication: No family at bedside. There is a nephew who is the power of attorney. Disposition Plan: Healthcare power of attorney is elected to pursue PEG tube placement. This is planned for early next week.    LOS: 12 days   [facility]  [facility] Pager [phone] 03/17/2016, 9:17 AM  If 7PM-7AM, please contact night-coverage at [email or web address], password [facility]

## 2016-03-17 NOTE — Progress Notes (Signed)
Hypoglycemic Event  CBG: 50  Treatment: D50 IV 50 mL  Symptoms: None  Follow-up CBG: Time:0504 CBG Result:204   Possible Reasons for Event: Inadequate meal intake, patient is NPO at this time.   Comments/MD notified:Krishnan,MD notified. No new orders at this time.Will continue to monitor and treat per MD orders.    Alysia PennaKelli G Giada Schoppe

## 2016-03-18 LAB — BASIC METABOLIC PANEL
Anion gap: 12 (ref 5–15)
CHLORIDE: 108 mmol/L (ref 101–111)
CO2: 20 mmol/L — ABNORMAL LOW (ref 22–32)
CREATININE: 0.9 mg/dL (ref 0.44–1.00)
Calcium: 10.6 mg/dL — ABNORMAL HIGH (ref 8.9–10.3)
GFR calc Af Amer: >60 mL/min (ref >60)
GFR calc non Af Amer: >60 mL/min (ref >60)
GLUCOSE: 99 mg/dL (ref 65–99)
POTASSIUM: 3.3 mmol/L — AB (ref 3.5–5.1)
Sodium: 140 mmol/L (ref 135–145)

## 2016-03-18 LAB — MAGNESIUM: Magnesium: 1.7 mg/dL (ref 1.7–2.4)

## 2016-03-18 LAB — GLUCOSE, CAPILLARY
GLUCOSE-CAPILLARY: 142 mg/dL — AB (ref 65–99)
GLUCOSE-CAPILLARY: 106 mg/dL — AB (ref 65–99)
GLUCOSE-CAPILLARY: 130 mg/dL — AB (ref 65–99)
GLUCOSE-CAPILLARY: 136 mg/dL — AB (ref 65–99)
Glucose-Capillary: 75 mg/dL (ref 65–99)
Glucose-Capillary: 85 mg/dL (ref 65–99)

## 2016-03-18 MED ORDER — POTASSIUM CHLORIDE CRYS ER 20 MEQ PO TBCR: 20.0000 meq | EXTENDED_RELEASE_TABLET | Freq: Two times a day (BID) | ORAL | Status: DC

## 2016-03-18 MED ORDER — FLEET ENEMA 7-19 GM/118ML RE ENEM
1.0000 | ENEMA | Freq: Once | RECTAL | Status: AC
  Administered 2016-03-18: 1 via RECTAL
  Filled 2016-03-18: qty 1

## 2016-03-18 MED ORDER — HEPARIN SODIUM (PORCINE) 5000 UNIT/ML IJ SOLN
5000.0000 [IU] | Freq: Three times a day (TID) | INTRAMUSCULAR | Status: DC
  Administered 2016-03-19 – 2016-03-21 (×6): 5000 [IU] via SUBCUTANEOUS
  Filled 2016-03-18 (×6): qty 1

## 2016-03-18 MED ORDER — MAGNESIUM SULFATE IN D5W 1-5 GM/100ML-% IV SOLN
1.0000 g | Freq: Once | INTRAVENOUS | Status: AC
  Administered 2016-03-18: 1 g via INTRAVENOUS
  Filled 2016-03-18: qty 100

## 2016-03-18 MED ORDER — SODIUM CHLORIDE 0.9 % IV SOLN
30.0000 meq | Freq: Once | INTRAVENOUS | Status: AC
  Administered 2016-03-18: 30 meq via INTRAVENOUS
  Filled 2016-03-18: qty 15

## 2016-03-18 MED ORDER — HYDRALAZINE HCL 20 MG/ML IJ SOLN
10.0000 mg | Freq: Four times a day (QID) | INTRAMUSCULAR | Status: DC | PRN
  Administered 2016-03-18 (×2): 10 mg via INTRAVENOUS
  Filled 2016-03-18 (×3): qty 1

## 2016-03-18 NOTE — Progress Notes (Signed)
[facility] PROGRESS NOTE  Sheila Cisneros MVH:[id number] DOB: [birth year] DOA: 03/05/2016  PCP: Sanda Lingerhomas Jones, MD  Brief History/Interval Summary: 75 year old African-American female with a past medical history of dementia, GERD, type 2 diabetes, COPD, hypertension, chronic low back pain, hepatitis presented from an assisted living facility. At baseline, patient is nonverbal and has had multiple aspiration events in past. She was found to be in acute respiratory failure. She had to be intubated in the emergency department and was admitted to the ICU. She was stabilized, extubated and then transferred to the floor. Dysphagia is the main active issue. Patient seen by palliative medicine. They have discussed with power of attorney. Power of attorney wants to proceed with feeding tube.  Reason for Visit: Aspiration pneumonia  Consultants: Palliative medicine. Interventional radiology  Procedures:  Intubation  Antibiotics: Completed course of antibiotics. She was on the following at various times during this hospital stay: Unasyn, cefepime and vancomycin  Subjective/Interval History: Patient remains confused and distracted.   ROS: Unable to do due to her dementia  Objective:  Vital Signs  Vitals:   03/18/16 0500 03/18/16 0506 03/18/16 0639 03/18/16 0913  BP:  (!) 153/117 (!) 155/77   Pulse:  (!) 112 90   Resp:  17    Temp:  98.1 F (36.7 C)    TempSrc:  Oral    SpO2:  100%  98%  Weight: 49 kg (108 lb 1.6 oz)     Height:        Intake/Output Summary (Last 24 hours) at 03/18/16 1018 Last data filed at 03/18/16 [id number]  Gross per 24 hour  Intake          1173.34 ml  Output                0 ml  Net          1173.34 ml   Filed Weights   03/16/16 0330 03/17/16 0500 03/18/16 0500  Weight: 47.9 kg (105 lb 11.2 oz) 49.8 kg (109 lb 12.8 oz) 49 kg (108 lb 1.6 oz)    General appearance: alert and Distracted. Noted to be slightly tremulous this morning. Resp: Diminished air  entry at the bases. No crackles, wheezing or rhonchi. Cardio: regular rate and rhythm, S1, S2 normal, no murmur, click, rub or gallop GI: soft, non-tender; bowel sounds normal; no masses,  no organomegaly Neurologic: Awake. Not cooperative with examination. Moves all extremities.  Lab Results:  Data Reviewed: I have personally reviewed following labs and imaging studies  CBC:  Recent Labs Lab 03/12/16 0226 03/13/16 0619 03/14/16 0634 03/15/16 0416 03/17/16 0421  WBC 10.9* 10.5 13.5* 11.9* 11.4*  HGB 9.0* 12.6 10.9* 11.0* 10.4*  HCT 29.0* 42.4 33.6* 34.1* 32.3*  MCV 87.6 86.2 86.4 85.7 85.4  PLT 398 341 458* 466* 506*    Basic Metabolic Panel:  Recent Labs Lab 03/12/16 0226 03/13/16 0619 03/14/16 0634 03/15/16 0416 03/17/16 0421 03/17/16 0919 03/17/16 1500 03/18/16 0805  NA 140 138 136 138 139  --  138 140  K 3.4* 3.4* 5.2* 3.4* 2.8*  --  3.3* 3.3*  CL 105 100* 103 105 107  --  109 108  CO2 25 25 22 23 23   --  20* 20*  GLUCOSE 120* 137* 125* 93 48*  --  114* 99  BUN 15 9 9 8 7   --  6 <5*  CREATININE 0.92 0.77 1.03* 0.97 1.09*  --  1.02* 0.90  CALCIUM 9.3 10.3 9.7 9.9 10.0  --  10.2 10.6*  MG 1.6* 1.9 1.7 1.6*  --  1.5*  --  1.7  PHOS 4.3 3.4 3.3 3.6  --   --   --   --     GFR: Estimated Creatinine Clearance: 42.4 mL/min (by C-G formula based on SCr of 0.9 mg/dL).  CBG:  Recent Labs Lab 03/17/16 1712 03/17/16 2156 03/18/16 0015 03/18/16 0502 03/18/16 0817  GLUCAP 122* 124* 75 142* 85     No results found for this or any previous visit (from the past 240 hour(s)).    Radiology Studies: No results found.   Medications:  Scheduled: . budesonide (PULMICORT) nebulizer solution  0.5 mg Nebulization BID  . heparin  5,000 Units Subcutaneous Q8H  . insulin aspart  0-9 Units Subcutaneous Q4H  . mouth rinse  15 mL Mouth Rinse BID  . [START ON 03/19/2016] vancomycin  1,000 mg Intravenous Once  . venlafaxine  37.5 mg Oral Q breakfast   Continuous: .  dextrose 5 % and 0.45% NaCl 1,000 mL infusion 50 mL/hr at 03/17/16 0906   ZOX:WRUEAVPRN:sodium chloride, hydrALAZINE, ipratropium-albuterol  Assessment/Plan:  Active Problems:   Aspiration pneumonia (HCC)   Acute respiratory failure with hypoxia (HCC)   AKI (acute kidney injury) (HCC)   Pressure injury of skin   Acute on chronic respiratory failure with hypoxia (HCC)   Pneumonia of both lower lobes due to methicillin resistant Staphylococcus aureus (MRSA) (HCC)   Panlobular emphysema (HCC)   Acute pulmonary edema (HCC)   Chronic diastolic CHF (congestive heart failure) (HCC)   Acute kidney injury (HCC)   Oropharyngeal dysphagia   Aspiration into airway   Palliative care encounter   Goals of care, counseling/discussion    Acute on Chronic Respiratory Failure with hypoxia/ positive MRSA in sputum/Aspiration PNA Her presentation was most likely secondary to aspiration, although she was found to have MRSA in sputum. She has completed 10 days of treatment with vancomycin. Patient expected to continue to have recurrent aspiration pneumonia. Currently, appears to be stable from a respiratory standpoint.  Dysphagia (Multiple aspiration events)  Patient failed swallow study on 11/28. Nothing by mouth for now. Palliative medicine was consulted. Palliative medicine discussed with patient's power of attorney. After long discussions over the phone power of attorney mentioning that he wants everything done for the patient based on her previous wishes. In view of this interventional radiology was consulted for PEG tube placement, even though this is not ideal for this patient. Would not feed her orally due to high aspiration risk. Await PEG tube placement.  Abnormal density in the left lung base Incidentally noted on CT abdomen. Most likely this is an inflammatory process. Patient does have a history of bronchiectasis. Similar, although less severe findings, noted on previous imaging studies including a CT  scan from May 2017. These findings are thought to be secondary to chronic aspiration. At this time, we will not pursue any further investigations. This can be further evaluated as an outpatient, although patient's poor long-term prognosis and dementia argues against further evaluation.  History of COPD, Bronchiectasis Stable. Continue Pulmicort. Continue nebulizer treatments.   Pulm edema.  Resolved  S/P hypotension 2/2 hypovoplemia and sepsis Resolved  Chronic diastolic CHF Stable. Strict ins and outs, daily weights.  Hypokalemia Potassium level has improved. Continue to replete. Also noted to have hypomagnesemia.   AKI  Thought to be secondary to hypovolemia. Now resolved.  DM Type 2 controlled with complications No further episodes of hypoglycemia. Continue D5 infusion until enteral feeds  can be initiated.  Dementia/Encephalopathy secondary to PNA;  Possibly at baseline now.  DVT Prophylaxis: Heparin subcutaneously    Code Status: Full code for now  Family Communication: No family at bedside. There is a nephew who is the power of attorney. Disposition Plan: Healthcare power of attorney is elected to pursue PEG tube placement. This is planned for early next week. She will likely go to skilled nursing facility at the time of discharge.    LOS: 13 days   [facility]  [facility] Pager [phone] 03/18/2016, 10:18 AM  If 7PM-7AM, please contact night-coverage at [email or web address], password [facility]

## 2016-03-18 NOTE — Progress Notes (Signed)
Patient BP was 173/100 and she received Hydralazine 10 mg IV per PRN order. After the medication, BP came down to 140/61. Will continue to monitor.

## 2016-03-18 NOTE — Progress Notes (Signed)
Pt was reported to be having some PVCs by CMT. MD notified. Will continue to monitor.

## 2016-03-18 NOTE — Progress Notes (Signed)
Central tele notified RN about patient being tachy - 123-125. Patient is asymptomatic and MD was informed. No new orders at this time. Per his recommendation, will continue to monitor.

## 2016-03-19 ENCOUNTER — Encounter (HOSPITAL_COMMUNITY): Payer: Self-pay | Admitting: Interventional Radiology

## 2016-03-19 ENCOUNTER — Inpatient Hospital Stay (HOSPITAL_COMMUNITY): Payer: Medicare Other

## 2016-03-19 HISTORY — PX: IR GENERIC HISTORICAL: IMG1180011

## 2016-03-19 LAB — CBC WITH DIFFERENTIAL/PLATELET
BASOS ABS: 0 10*3/uL (ref 0.0–0.1)
BASOS PCT: 0 %
EOS ABS: 0.1 10*3/uL (ref 0.0–0.7)
EOS PCT: 1 %
HCT: 30.9 % — ABNORMAL LOW (ref 36.0–46.0)
Hemoglobin: 9.9 g/dL — ABNORMAL LOW (ref 12.0–15.0)
LYMPHS PCT: 26 %
Lymphs Abs: 2.7 10*3/uL (ref 0.7–4.0)
MCH: 27.3 pg (ref 26.0–34.0)
MCHC: 32 g/dL (ref 30.0–36.0)
MCV: 85.1 fL (ref 78.0–100.0)
MONO ABS: 0.4 10*3/uL (ref 0.1–1.0)
Monocytes Relative: 4 %
Neutro Abs: 6.9 10*3/uL (ref 1.7–7.7)
Neutrophils Relative %: 69 %
PLATELETS: 393 10*3/uL (ref 150–400)
RBC: 3.63 MIL/uL — AB (ref 3.87–5.11)
RDW: 16.1 % — AB (ref 11.5–15.5)
WBC: 10.1 10*3/uL (ref 4.0–10.5)

## 2016-03-19 LAB — GLUCOSE, CAPILLARY
GLUCOSE-CAPILLARY: 164 mg/dL — AB (ref 65–99)
GLUCOSE-CAPILLARY: 96 mg/dL (ref 65–99)
Glucose-Capillary: 133 mg/dL — ABNORMAL HIGH (ref 65–99)
Glucose-Capillary: 79 mg/dL (ref 65–99)
Glucose-Capillary: 63 mg/dL — ABNORMAL LOW (ref 65–99)
Glucose-Capillary: 112 mg/dL — ABNORMAL HIGH (ref 65–99)
Glucose-Capillary: 102 mg/dL — ABNORMAL HIGH (ref 65–99)

## 2016-03-19 LAB — BASIC METABOLIC PANEL
ANION GAP: 6 (ref 5–15)
BUN: 5 mg/dL — ABNORMAL LOW (ref 6–20)
CALCIUM: 10.2 mg/dL (ref 8.9–10.3)
CO2: 22 mmol/L (ref 22–32)
Chloride: 109 mmol/L (ref 101–111)
Creatinine, Ser: 0.96 mg/dL (ref 0.44–1.00)
GFR calc Af Amer: >60 mL/min (ref >60)
GFR, EST NON AFRICAN AMERICAN: 57 mL/min — AB (ref >60)
GLUCOSE: 196 mg/dL — AB (ref 65–99)
POTASSIUM: 3.1 mmol/L — AB (ref 3.5–5.1)
SODIUM: 137 mmol/L (ref 135–145)

## 2016-03-19 LAB — PROTIME-INR
INR: 1.27
PROTHROMBIN TIME: 16 s — AB (ref 11.4–15.2)

## 2016-03-19 MED ORDER — DEXTROSE 50 % IV SOLN
INTRAVENOUS | Status: AC
  Administered 2016-03-19: 25 mL
  Filled 2016-03-19: qty 50

## 2016-03-19 MED ORDER — MIDAZOLAM HCL 2 MG/2ML IJ SOLN
INTRAMUSCULAR | Status: AC
  Filled 2016-03-19: qty 2

## 2016-03-19 MED ORDER — FENTANYL CITRATE (PF) 100 MCG/2ML IJ SOLN
INTRAMUSCULAR | Status: AC | PRN
  Administered 2016-03-19 (×2): 50 ug via INTRAVENOUS

## 2016-03-19 MED ORDER — VANCOMYCIN HCL IN DEXTROSE 1-5 GM/200ML-% IV SOLN
INTRAVENOUS | Status: AC
  Administered 2016-03-19: 11:00:00
  Filled 2016-03-19: qty 200

## 2016-03-19 MED ORDER — POTASSIUM CHLORIDE 2 MEQ/ML IV SOLN
30.0000 meq | Freq: Once | INTRAVENOUS | Status: AC
  Administered 2016-03-19: 30 meq via INTRAVENOUS
  Filled 2016-03-19: qty 15

## 2016-03-19 MED ORDER — MIDAZOLAM HCL 2 MG/2ML IJ SOLN
INTRAMUSCULAR | Status: AC | PRN
  Administered 2016-03-19 (×2): 1 mg via INTRAVENOUS

## 2016-03-19 MED ORDER — GLUCAGON HCL RDNA (DIAGNOSTIC) 1 MG IJ SOLR
INTRAMUSCULAR | Status: AC
  Filled 2016-03-19: qty 1

## 2016-03-19 MED ORDER — FENTANYL CITRATE (PF) 100 MCG/2ML IJ SOLN
INTRAMUSCULAR | Status: AC
  Filled 2016-03-19: qty 2

## 2016-03-19 MED ORDER — LIDOCAINE HCL 1 % IJ SOLN
INTRAMUSCULAR | Status: AC
  Filled 2016-03-19: qty 20

## 2016-03-19 MED ORDER — GLUCAGON HCL RDNA (DIAGNOSTIC) 1 MG IJ SOLR
INTRAMUSCULAR | Status: AC | PRN
  Administered 2016-03-19: .5 mg via INTRAVENOUS

## 2016-03-19 MED ORDER — IOPAMIDOL (ISOVUE-300) INJECTION 61%
INTRAVENOUS | Status: AC
  Administered 2016-03-19: 25 mL
  Filled 2016-03-19: qty 50

## 2016-03-19 NOTE — Procedures (Signed)
21F gastrostomy tube placement under fluoro No complication No blood loss. See complete dictation in [facility] PACS.  

## 2016-03-19 NOTE — Progress Notes (Signed)
CBG 63. Will administer 1/2 amp pf D50. NT will re-ceck 15 minutes after administering. Pt is alert. Will continue to monitor closely.

## 2016-03-19 NOTE — Sedation Documentation (Signed)
Vital signs stable. 

## 2016-03-19 NOTE — Sedation Documentation (Signed)
Patient is resting comfortably. 

## 2016-03-19 NOTE — Sedation Documentation (Signed)
Pt very agitated during insertion of OG

## 2016-03-19 NOTE — Progress Notes (Signed)
[facility] PROGRESS NOTE  Sheila Cisneros QIH:[id number] DOB: [birth year] DOA: 03/05/2016  PCP: Sanda Lingerhomas Jones, MD  Brief History/Interval Summary: 75 year old African-American female with a past medical history of dementia, GERD, type 2 diabetes, COPD, hypertension, chronic low back pain, hepatitis presented from an assisted living facility. At baseline, patient is nonverbal and has had multiple aspiration events in past. She was found to be in acute respiratory failure. She had to be intubated in the emergency department and was admitted to the ICU. She was stabilized, extubated and then transferred to the floor. Dysphagia is the main active issue. Patient seen by palliative medicine. They have discussed with power of attorney. Power of attorney wants to proceed with feeding tube.  Reason for Visit: Aspiration pneumonia  Consultants: Palliative medicine. Interventional radiology  Procedures:  Intubation  PEG tube to be placed today  Antibiotics: Completed course of antibiotics. She was on the following at various times during this hospital stay: Unasyn, cefepime and vancomycin  Subjective/Interval History: Patient remains confused and distracted.   ROS: Unable to do due to her dementia  Objective:  Vital Signs  Vitals:   03/19/16 0448 03/19/16 0500 03/19/16 0528 03/19/16 0728  BP: (!) 169/84  (!) 154/90   Pulse: 89  100   Resp: 16     Temp: 98.1 F (36.7 C)     TempSrc: Axillary     SpO2: 99%   99%  Weight:  47 kg (103 lb 11.2 oz)    Height:        Intake/Output Summary (Last 24 hours) at 03/19/16 0825 Last data filed at 03/19/16 0425  Gross per 24 hour  Intake          1170.83 ml  Output                0 ml  Net          1170.83 ml   Filed Weights   03/17/16 0500 03/18/16 0500 03/19/16 0500  Weight: 49.8 kg (109 lb 12.8 oz) 49 kg (108 lb 1.6 oz) 47 kg (103 lb 11.2 oz)    General appearance: alert and Distracted. Noted to be slightly tremulous this  morning. Resp: Diminished air entry at the bases. No crackles, wheezing or rhonchi. Cardio: regular rate and rhythm, S1, S2 normal, no murmur, click, rub or gallop GI: soft, non-tender; bowel sounds normal; no masses,  no organomegaly Neurologic: Awake. Not cooperative with examination. Moves all extremities.  Lab Results:  Data Reviewed: I have personally reviewed following labs and imaging studies  CBC:  Recent Labs Lab 03/13/16 0619 03/14/16 0634 03/15/16 0416 03/17/16 0421  WBC 10.5 13.5* 11.9* 11.4*  HGB 12.6 10.9* 11.0* 10.4*  HCT 42.4 33.6* 34.1* 32.3*  MCV 86.2 86.4 85.7 85.4  PLT 341 458* 466* 506*    Basic Metabolic Panel:  Recent Labs Lab 03/13/16 0619 03/14/16 0634 03/15/16 0416 03/17/16 0421 03/17/16 0919 03/17/16 1500 03/18/16 0805  NA 138 136 138 139  --  138 140  K 3.4* 5.2* 3.4* 2.8*  --  3.3* 3.3*  CL 100* 103 105 107  --  109 108  CO2 25 22 23 23   --  20* 20*  GLUCOSE 137* 125* 93 48*  --  114* 99  BUN 9 9 8 7   --  6 <5*  CREATININE 0.77 1.03* 0.97 1.09*  --  1.02* 0.90  CALCIUM 10.3 9.7 9.9 10.0  --  10.2 10.6*  MG 1.9 1.7 1.6*  --  1.5*  --  1.7  PHOS 3.4 3.3 3.6  --   --   --   --     GFR: Estimated Creatinine Clearance: 40.7 mL/min (by C-G formula based on SCr of 0.9 mg/dL).  CBG:  Recent Labs Lab 03/18/16 1616 03/18/16 Sep 09, 2119 03/19/16 0045 03/19/16 0446 03/19/16 0814  GLUCAP 130* 136* 96 133* 79     No results found for this or any previous visit (from the past 240 hour(s)).    Radiology Studies: No results found.   Medications:  Scheduled: . budesonide (PULMICORT) nebulizer solution  0.5 mg Nebulization BID  . heparin  5,000 Units Subcutaneous Q8H  . insulin aspart  0-9 Units Subcutaneous Q4H  . mouth rinse  15 mL Mouth Rinse BID  . vancomycin  1,000 mg Intravenous Once  . venlafaxine  37.5 mg Oral Q breakfast   Continuous: . dextrose 5 % and 0.45% NaCl 1,000 mL infusion 50 mL/hr at 03/18/16 1054   ZOX:WRUEAV  chloride, hydrALAZINE, ipratropium-albuterol  Assessment/Plan:  Active Problems:   Aspiration pneumonia (HCC)   Acute respiratory failure with hypoxia (HCC)   AKI (acute kidney injury) (HCC)   Pressure injury of skin   Acute on chronic respiratory failure with hypoxia (HCC)   Pneumonia of both lower lobes due to methicillin resistant Staphylococcus aureus (MRSA) (HCC)   Panlobular emphysema (HCC)   Acute pulmonary edema (HCC)   Chronic diastolic CHF (congestive heart failure) (HCC)   Acute kidney injury (HCC)   Oropharyngeal dysphagia   Aspiration into airway   Palliative care encounter   Goals of care, counseling/discussion    Acute on Chronic Respiratory Failure with hypoxia/ positive MRSA in sputum/Aspiration PNA Her presentation was most likely secondary to aspiration, although she was found to have MRSA in sputum. She has completed 10 days of treatment with vancomycin. Patient expected to continue to have recurrent aspiration pneumonia. Currently, appears to be stable from a respiratory standpoint.  Dysphagia (Multiple aspiration events)  Patient failed swallow study on 11/28. Nothing by mouth for now. Palliative medicine was consulted. Palliative medicine discussed with patient's power of attorney. After long discussions over the phone power of attorney mentioning that he wants everything done for the patient based on her previous wishes. In view of this interventional radiology was consulted for PEG tube placement, even though this is not ideal for this patient. Would not feed her orally due to high aspiration risk. Await PEG tube placement.  Abnormal density in the left lung base Incidentally noted on CT abdomen. Most likely this is an inflammatory process. Patient does have a history of bronchiectasis. Similar, although less severe findings, noted on previous imaging studies including a CT scan from 2015/09/09. These findings are thought to be secondary to chronic aspiration. At  this time, we will not pursue any further investigations. This can be further evaluated as an outpatient, although patient's poor long-term prognosis and dementia argues against further evaluation.  History of COPD, Bronchiectasis Stable. Continue Pulmicort. Continue nebulizer treatments.   Pulm edema.  Resolved  S/P hypotension 2/2 hypovoplemia and sepsis Resolved  Chronic diastolic CHF Stable. Strict ins and outs, daily weights.  Hypokalemia Replace potassium. Check magnesium tomorrow. Repeat labs tomorrow.  AKI  Thought to be secondary to hypovolemia. Now resolved.  DM Type 2 controlled with complications No further episodes of hypoglycemia. Continue D5 infusion until enteral feeds can be initiated.  Dementia/Encephalopathy secondary to PNA;  Possibly at baseline now.  DVT Prophylaxis: Heparin subcutaneously  Code Status: Full code for now  Family Communication: No family at bedside. There is a nephew who is the power of attorney. Disposition Plan: Healthcare power of attorney is elected to pursue PEG tube placement. This is planned for today. She will likely go to skilled nursing facility at the time of discharge.    LOS: 14 days   [facility]ISHNA[facility],Keymarion Bearman  [facility] Pager [phone] 03/19/2016, 8:25 AM  If 7PM-7AM, please contact night-coverage at [email or web address], password [email or web address]

## 2016-03-19 NOTE — Sedation Documentation (Signed)
Pt  tolerated procedure well.

## 2016-03-20 LAB — CBC
HEMATOCRIT: 31 % — AB (ref 36.0–46.0)
HEMOGLOBIN: 9.9 g/dL — AB (ref 12.0–15.0)
MCH: 27.3 pg (ref 26.0–34.0)
MCHC: 31.9 g/dL (ref 30.0–36.0)
MCV: 85.4 fL (ref 78.0–100.0)
PLATELETS: 369 10*3/uL (ref 150–400)
RBC: 3.63 MIL/uL — AB (ref 3.87–5.11)
RDW: 15.9 % — ABNORMAL HIGH (ref 11.5–15.5)
WBC: 7.5 10*3/uL (ref 4.0–10.5)

## 2016-03-20 LAB — BASIC METABOLIC PANEL
ANION GAP: 9 (ref 5–15)
BUN: <5 mg/dL — ABNORMAL LOW (ref 6–20)
CHLORIDE: 108 mmol/L (ref 101–111)
CO2: 21 mmol/L — AB (ref 22–32)
CREATININE: 1.02 mg/dL — AB (ref 0.44–1.00)
Calcium: 10.3 mg/dL (ref 8.9–10.3)
GFR calc non Af Amer: 53 mL/min — ABNORMAL LOW (ref >60)
Glucose, Bld: 122 mg/dL — ABNORMAL HIGH (ref 65–99)
POTASSIUM: 3.4 mmol/L — AB (ref 3.5–5.1)
SODIUM: 138 mmol/L (ref 135–145)

## 2016-03-20 LAB — GLUCOSE, CAPILLARY
GLUCOSE-CAPILLARY: 123 mg/dL — AB (ref 65–99)
GLUCOSE-CAPILLARY: 84 mg/dL (ref 65–99)
GLUCOSE-CAPILLARY: 85 mg/dL (ref 65–99)
Glucose-Capillary: 113 mg/dL — ABNORMAL HIGH (ref 65–99)
Glucose-Capillary: 117 mg/dL — ABNORMAL HIGH (ref 65–99)
Glucose-Capillary: 156 mg/dL — ABNORMAL HIGH (ref 65–99)

## 2016-03-20 LAB — MAGNESIUM: Magnesium: 1.4 mg/dL — ABNORMAL LOW (ref 1.7–2.4)

## 2016-03-20 MED ORDER — VERAPAMIL HCL 240 MG (CO) PO TB24: ORAL_TABLET | ORAL | 3 refills | Status: DC

## 2016-03-20 MED ORDER — ONDANSETRON HCL 4 MG/2ML IJ SOLN: 4.0000 mg | INTRAMUSCULAR | Status: DC | PRN

## 2016-03-20 MED ORDER — JEVITY 1.2 CAL PO LIQD
1000.0000 mL | ORAL | 0 refills | Status: DC
Start: 2016-03-20 — End: 2016-06-01

## 2016-03-20 MED ORDER — VENLAFAXINE HCL 37.5 MG PO TABS: 37.5000 mg | ORAL_TABLET | ORAL | Status: AC

## 2016-03-20 MED ORDER — ASPIRIN EC 81 MG PO TBEC: 81.0000 mg | DELAYED_RELEASE_TABLET | Freq: Every day | ORAL | Status: DC

## 2016-03-20 MED ORDER — ACETAMINOPHEN 500 MG PO TABS: 500.0000 mg | ORAL_TABLET | Freq: Four times a day (QID) | ORAL | 0 refills | Status: DC | PRN

## 2016-03-20 MED ORDER — POTASSIUM CHLORIDE 20 MEQ/15ML (10%) PO SOLN
40.0000 meq | Freq: Once | ORAL | Status: AC
  Administered 2016-03-20: 40 meq
  Filled 2016-03-20: qty 30

## 2016-03-20 MED ORDER — MAGNESIUM SULFATE 2 GM/50ML IV SOLN
2.0000 g | Freq: Once | INTRAVENOUS | Status: AC
  Administered 2016-03-20: 2 g via INTRAVENOUS
  Filled 2016-03-20: qty 50

## 2016-03-20 MED ORDER — MULTIVITAMINS PO CAPS: 1.0000 | ORAL_CAPSULE | Freq: Every day | ORAL | Status: DC

## 2016-03-20 MED ORDER — JEVITY 1.2 CAL PO LIQD
1000.0000 mL | ORAL | Status: DC
  Administered 2016-03-20: 1000 mL
  Filled 2016-03-20 (×3): qty 1000

## 2016-03-20 MED ORDER — SIMVASTATIN 20 MG PO TABS: 20.0000 mg | ORAL_TABLET | Freq: Every day | ORAL | 3 refills | Status: DC

## 2016-03-20 MED ORDER — DONEPEZIL HCL 5 MG PO TABS: ORAL_TABLET | ORAL | 3 refills | Status: DC

## 2016-03-20 NOTE — Discharge Instructions (Signed)
Aspiration Pneumonia Aspiration pneumonia is an infection in your lungs. It occurs when food, liquid, or stomach contents (vomit) are inhaled (aspirated) into your lungs. When these things get into your lungs, swelling (inflammation) and infection can occur. This can make it difficult for you to breathe. Aspiration pneumonia is a serious condition and can be life threatening. What increases the risk? Aspiration pneumonia is more likely to occur when a person's cough (gag) reflex or ability to swallow has been decreased. Some things that can do this include:  Having a brain injury or disease, such as stroke, seizures, Parkinson's disease, dementia, or amyotrophic lateral sclerosis (ALS).  Being given general anesthetic for procedures.  Being in a coma (unconscious).  Having a narrowing of the tube that carries food to the stomach (esophagus).  Drinking too much alcohol. If a person passes out and vomits, vomit can be swallowed into the lungs.  Taking certain medicines, such as tranquilizers or sedatives. What are the signs or symptoms?  Coughing after swallowing food or liquids.  Breathing problems, such as wheezing or shortness of breath.  Bluish skin. This can be caused by lack of oxygen.  Coughing up food or mucus. The mucus might contain blood, greenish material, or yellowish-white fluid (pus).  Fever.  Chest pain.  Being more tired than usual (fatigue).  Sweating more than usual.  Bad breath. How is this diagnosed? A physical exam will be done. During the exam, the health care provider will listen to your lungs with a stethoscope to check for:  Crackling sounds in the lungs.  Decreased breath sounds.  A rapid heartbeat. Various tests may be ordered. These may include:  Chest X-ray.  CT scan.  Swallowing study. This test looks at how food is swallowed and whether it goes into your breathing tube (trachea) or food pipe (esophagus).  Sputum culture. Saliva and  mucus (sputum) are collected from the lungs or the tubes that carry air to the lungs (bronchi). The sputum is then tested for bacteria.  Bronchoscopy. This test uses a flexible tube (bronchoscope) to see inside the lungs. How is this treated? Treatment will usually include antibiotic medicines. Other medicines may also be used to reduce fever or pain. You may need to be treated in the hospital. In the hospital, your breathing will be carefully monitored. Depending on how well you are breathing, you may need to be given oxygen, or you may need breathing support from a breathing machine (ventilator). For people who fail a swallowing study, a feeding tube might be placed in the stomach, or they may be asked to avoid certain food textures or liquids when they eat. Follow these instructions at home:  Carefully follow any special eating instructions you were given, such as avoiding certain food textures or thickening liquids. This reduces the risk of developing aspiration pneumonia again.  Only take over-the-counter or prescription medicines as directed by your health care provider. Follow the directions carefully.  If you were prescribed antibiotics, take them as directed. Finish them even if you start to feel better.  Rest as instructed by your health care provider.  Keep all follow-up appointments with your health care provider. Contact a health care provider if:  You develop worsening shortness of breath, wheezing, or difficulty breathing.  You develop a fever.  You have chest pain. This information is not intended to replace advice given to you by your health care provider. Make sure you discuss any questions you have with your health care provider. Document Released:  01/28/2009 Document Revised: 09/14/2015 Document Reviewed: 09/18/2012 Elsevier Interactive Patient Education  2017 [employer] Inc.   Dysphagia Swallowing problems (dysphagia) occur when solids and liquids seem to stick in  your throat on the way down to your stomach, or the food takes longer to get to the stomach. Other symptoms include regurgitating food, noises coming from the throat, chest discomfort with swallowing, and a feeling of fullness or the feeling of something being stuck in your throat when swallowing. When blockage in your throat is complete, it may be associated with drooling. CAUSES  Problems with swallowing may occur because of problems with the muscles. The food cannot be propelled in the usual manner into your stomach. You may have ulcers, scar tissue, or inflammation in the tube down which food travels from your mouth to your stomach (esophagus), which blocks food from passing normally into the stomach. Causes of inflammation include:  Acid reflux from your stomach into your esophagus.  Infection.  Radiation treatment for cancer.  Medicines taken without enough fluids to wash them down into your stomach. You may have nerve problems that prevent signals from being sent to the muscles of your esophagus to contract and move your food down to your stomach. Globus pharyngeus is a relatively common problem in which there is a sense of an obstruction or difficulty in swallowing, without any physical abnormalities of the swallowing passages being found. This problem usually improves over time with reassurance and testing to rule out other causes. DIAGNOSIS Dysphagia can be diagnosed and its cause can be determined by tests in which you swallow a white substance that helps illuminate the inside of your throat (contrast medium) while X-rays are taken. Sometimes a flexible telescope that is inserted down your throat (endoscopy) to look at your esophagus and stomach is used. TREATMENT   If the dysphagia is caused by acid reflux or infection, medicines may be used.  If the dysphagia is caused by problems with your swallowing muscles, swallowing therapy may be used to help you strengthen your swallowing  muscles.  If the dysphagia is caused by a blockage or mass, procedures to remove the blockage may be done. HOME CARE INSTRUCTIONS  Try to eat soft food that is easier to swallow and check your weight on a daily basis to be sure that it is not decreasing.  Be sure to drink liquids when sitting upright (not lying down). SEEK MEDICAL CARE IF:  You are losing weight because you are unable to swallow.  You are coughing when you drink liquids (aspiration).  You are coughing up partially digested food. SEEK IMMEDIATE MEDICAL CARE IF:  You are unable to swallow your own saliva?Marland Kitchen.  You are having shortness of breath or a fever, or both.?  You have a hoarse voice along with difficulty swallowing. MAKE SURE YOU:  Understand these instructions.  Will watch your condition.  Will get help right away if you are not doing well or get worse. This information is not intended to replace advice given to you by your health care provider. Make sure you discuss any questions you have with your health care provider. Document Released: 03/30/2000 Document Revised: 04/23/2014 Document Reviewed: 09/19/2012 Elsevier Interactive Patient Education  2017 [employer]

## 2016-03-20 NOTE — Progress Notes (Signed)
Released orders from 03/19/16 At 11:11.  Patient had no medications ordered from this nurse.  Site is clean and dry. Patient doesn't give any appearance of pain.  Though the patient does not talk much, she at times will say a few words.  She follows commands. As I was doing mouth care on patient, she clenched her teeth, but opened her mouth when I asked her to. Bed alarm is on for safety, which has been maintained.

## 2016-03-20 NOTE — Progress Notes (Addendum)
Nutrition Follow-up  DOCUMENTATION CODES:   Not applicable  INTERVENTION:   Initiate Jevity 1.2 @ 25 ml/hr and increase by 10 ml every 12 hours to goal rate of 1155ml/hr.   Tube feeding regimen provides 1584 kcal (100% of needs), 73 grams of protein, and 972 ml of H2O.   NUTRITION DIAGNOSIS:   Inadequate oral intake related to inability to eat as evidenced by NPO status.  Ongoing  GOAL:   Patient will meet greater than or equal to 90% of their needs  Progressing  MONITOR:   Vent status, Labs, TF tolerance, Skin, I & O's  REASON FOR ASSESSMENT:   Consult Enteral/tube feeding initiation and management  ASSESSMENT:   75 year old female with recent admission for PNA. At baseline patient has severe dementia and is non-verbal. Recently patient has had multiple events of aspiration. Presents to ED from SNF with diagnosis of PNA. During stay in ED patient had an aspiration event requiring intubation.   11/30- IR reviewed CT of abdomen and abdomen; approved for PEG placement 12/4- s/p G-tube placement  Per IR notes, ok to use tube.   Case discussed with RN, who confirmed plan to start TF. Hanging feeding pump at time of visit. Discussed TF orders for progression and goal rate. RD ordered slow progression of feeding to minimize refeeding risk. Pt has been without nutrition since 03/11/16 (>7 days).   Labs reviewed: K: 3.4, CBGS: 84-123.   Diet Order:  Diet NPO time specified Except for: Ice Chips  Skin:  Wound (see comment) (stage II to buttocks)  Last BM:  03/18/16  Height:   Ht Readings from Last 1 Encounters:  03/05/16 5\' 2"  (1.575 m)    Weight:   Wt Readings from Last 1 Encounters:  03/20/16 105 lb 1.6 oz (47.7 kg)    Ideal Body Weight:  50 kg  BMI:  Body mass index is 19.22 kg/m.  Estimated Nutritional Needs:   Kcal:  1350-1550  Protein:  70-80 gm  Fluid:  1.5 L  EDUCATION NEEDS:   No education needs identified at this time  Delsa Walder A.  Mayford KnifeWilliams, RD, LDN, CDE Pager: [phone] After hours Pager: [phone]

## 2016-03-20 NOTE — Progress Notes (Signed)
Referring Physician(s): Dr Barnie DelG Krishnan  Supervising Physician: Oley BalmHassell, Daniel  [facility]  Chief Complaint: G tube placed in IR 12/4  Subjective:  Aspiration risk Dysphagia G tube placed yesterday Afeb; site clean May use now  Allergies: Codeine; Neomycin; Penicillins; Tetanus toxoids; Diphtheria toxoid-containing vaccines; and Levofloxacin  Medications: Prior to Admission medications   Medication Sig Start Date End Date Taking? Authorizing Provider  acetaminophen (TYLENOL) 500 MG tablet Take 500 mg by mouth every 6 (six) hours as needed. For pain   Yes Historical Provider, MD  albuterol (PROVENTIL) (2.5 MG/3ML) 0.083% nebulizer solution 3 ml every 4 hours scheduled x 14 days then 3 ml four times daily and every 4 hours as needed for SOB/Wheezing   Yes Historical Provider, MD  Ascorbic Acid (VITAMIN C) 500 MG tablet Take 500 mg by mouth daily.     Yes Historical Provider, MD  aspirin EC 81 MG tablet Take 81 mg by mouth daily.   Yes Historical Provider, MD  cholecalciferol (VITAMIN D) 1000 UNITS tablet Take 1,000 Units by mouth daily.   Yes Historical Provider, MD  dextromethorphan-guaiFENesin (MUCINEX DM) 30-600 MG 12hr tablet Take 1 tablet by mouth 2 (two) times daily.   Yes Historical Provider, MD  donepezil (ARICEPT) 5 MG tablet TAKE 1 TABLET (5 MG TOTAL) BY MOUTH AT BEDTIME. Patient taking differently: TAKE 2 TABLET (10 MG TOTAL) BY MOUTH AT BEDTIME. 07/04/15  Yes Etta Grandchildhomas L Jones, MD  ipratropium-albuterol (DUONEB) 0.5-2.5 (3) MG/3ML SOLN Take 3 mLs by nebulization every 6 (six) hours as needed (for shortness of breath).   Yes Historical Provider, MD  Multiple Vitamin (MULTIVITAMIN) capsule Take 1 capsule by mouth daily.     Yes Historical Provider, MD  OVER THE COUNTER MEDICATION Take 1 Container by mouth daily. "Magic cup"   Yes Historical Provider, MD  OVER THE COUNTER MEDICATION Take 120 mLs by mouth 3 (three) times daily. medpass   Yes Historical  Provider, MD  simvastatin (ZOCOR) 20 MG tablet Take 1 tablet (20 mg total) by mouth at bedtime. 11/15/14  Yes Etta Grandchildhomas L Jones, MD  Tiotropium Bromide-Olodaterol (STIOLTO RESPIMAT) 2.5-2.5 MCG/ACT AERS Inhale 2 puffs into the lungs daily. 10/17/15  Yes Etta Grandchildhomas L Jones, MD  venlafaxine (EFFEXOR) 37.5 MG tablet Take 37.5 mg by mouth See admin instructions. Takes 1 capsule daily, then can take 1 addt'l cap as needed for hot flashes   Yes Historical Provider, MD  verapamil (COVERA HS) 240 MG (CO) 24 hr tablet Take 1 tablet (240 mg total) by mouth at bedtime. 11/15/14  Yes Etta Grandchildhomas L Jones, MD     Vital Signs: BP (!) 168/86 (BP Location: Left Arm)   Pulse 89   Temp 99.5 F (37.5 C) (Axillary)   Resp 18   Ht 5\' 2"  (1.575 m)   Wt 105 lb 1.6 oz (47.7 kg)   SpO2 99%   BMI 19.22 kg/m   Physical Exam  Abdominal: Soft. Bowel sounds are normal. There is no tenderness.  Skin: Skin is warm and dry.  Site of G tube is clean and dry NT No bleeding  Nursing note and vitals reviewed.   Imaging: Ir Gastrostomy Tube Mod Sed  Result Date: 03/19/2016 CLINICAL DATA:  Hiatal hernia. Dysphagia, needs enteral feeding support. EXAM: PERC PLACEMENT GASTROSTOMY FLUOROSCOPY TIME:  18minute, 33 mGy TECHNIQUE: The procedure, risks, benefits, and alternatives were explained to the patient and family. Questions regarding the procedure were encouraged and answered. The family understands and  consents to the procedure. As antibiotic prophylaxis, vancomycin 1 g was ordered pre-procedure and administered intravenously within one hour of incision. A safe percutaneous approach to the stomach had been confirmed on recent abdomen CT. Residual oral contrast material showed the decompressed colon in safe position for percutaneous approach. A 5 French angiographic catheter was placed as orogastric tube. The upper abdomen was prepped with Betadine, draped in usual sterile fashion, and infiltrated locally with 1% lidocaine. Intravenous  Fentanyl and Versed were administered as conscious sedation during continuous monitoring of the patient's level of consciousness and physiological / cardiorespiratory status by the radiology RN, with a total moderate sedation time of 26 minutes. Under fluoroscopy, the orogastric tube was advanced from the gastric hiatal hernia into the lumen of the body of the stomach using an angled Glidewire and a 5 French C2 catheter.Stomach was insufflated using air through the orogastric tube. An 1618 French sheath needle was advanced percutaneously into the gastric lumen under fluoroscopy. Gas could be aspirated and a small contrast injection confirmed intraluminal spread. The sheath was exchanged over a guidewire for a 9 [place]French vascular sheath, through which the snare device was advanced and used to snare a guidewire passed through the orogastric tube. This was withdrawn, and the snare attached to the 20 French pull-through gastrostomy tube, which was advanced antegrade, positioned with the internal bumper securing the anterior gastric wall to the anterior abdominal wall. Small contrast injection confirms appropriate positioning. The external bumper was applied and the catheter was flushed. COMPLICATIONS: COMPLICATIONS none IMPRESSION: 1. Technically successful 20 French pull-through gastrostomy placement under fluoroscopy. Electronically Signed   By: Corlis Leak  Hassell M.D.   On: 03/19/2016 15:37    Labs:  CBC:  Recent Labs  03/15/16 0416 03/17/16 0421 03/19/16 1207 03/20/16 0516  WBC 11.9* 11.4* 10.1 7.5  HGB 11.0* 10.4* 9.9* 9.9*  HCT 34.1* 32.3* 30.9* 31.0*  PLT 466* 506* 393 369    COAGS:  Recent Labs  03/16/16 0927 03/19/16 1207  INR 1.23 1.27    BMP:  Recent Labs  03/17/16 1500 03/18/16 0805 03/19/16 1207 03/20/16 0516  NA 138 140 137 138  K 3.3* 3.3* 3.1* 3.4*  CL 109 108 109 108  CO2 20* 20* 22 21*  GLUCOSE 114* 99 196* 122*  BUN 6 <5* 5* <5*  CALCIUM 10.2 10.6* 10.2 10.3  CREATININE  1.02* 0.90 0.96 1.02*  GFRNONAA 53* >60 57* 53*  GFRAA >60 >60 >60 >60    LIVER FUNCTION TESTS:  Recent Labs  03/23/15 1432 02/05/16 1818 02/06/16 0224 02/08/16 0523 02/09/16 0527 03/05/16 1133  BILITOT 0.3 0.5 0.5  --   --  0.4  AST 28 25 22   --   --  21  ALT 26 16 12*  --   --  21  ALKPHOS 163* 119 90  --   --  100  PROT 8.0 7.7 6.1*  --   --  6.0*  ALBUMIN 3.8 3.1* 2.4* 2.5* 2.3* 1.9*    Assessment and Plan:  Percutaneous gastric tube placed 12/4 May use now  Electronically Signed: Nickey Canedo A 03/20/2016, 10:12 AM   I spent a total of 15 Minutes at the the patient's bedside AND on the patient's hospital floor or unit, greater than 50% of which was counseling/coordinating care for G tube

## 2016-03-20 NOTE — Progress Notes (Signed)
Physical Therapy Treatment Patient Details Name: Sheila Cisneros MRN: [id number] DOB: November 01, 1940 Today's Date: 03/20/2016    History of Present Illness Pt is a 75 y/o female admitted secondary to unresponsiveness and AMS. PMH including but not limited to dementia, COPD, DM, HTN and aspiration. Pt was intubated 03/05/16 through 03/11/16.    PT Comments    The pt is still requiring total assist + 2 for all functional mobility.  Pt is able to progress to sitting EOB for several minutes with only min guard assistance. Pt performed two standing trials where she became tachycardic in the 120 BPM range, but HR returned to baseline upon sitting. Pt is limited by decreased strength and endurance.  Pt will benefit from PT to decrease her burden of care and make her as functional independent as possible.  Continue with POC.  Follow Up Recommendations  SNF     Equipment Recommendations  None recommended by PT    Recommendations for Other Services       Precautions / Restrictions Precautions Precautions: Fall Restrictions Weight Bearing Restrictions: No    Mobility  Bed Mobility Overal bed mobility: Needs Assistance;+2 for physical assistance Bed Mobility: Supine to Sit;Sit to Supine;Rolling Rolling: Total assist;+2 for physical assistance   Supine to sit: Total assist;+2 for physical assistance Sit to supine: Total assist;+2 for physical assistance   General bed mobility comments: Total A to navigate LEs on/off bed and to elevate trunk up from bed.  Transfers Overall transfer level: Needs assistance Equipment used: 2 person hand held assist Transfers: Sit to/from Stand Sit to Stand: Max assist;+2 physical assistance         General transfer comment: Max A to power up from EOB x 2.  Verbal cues for hand placement and foot placement with SPTA and PTA guarding pts LEs.  Pt unable to maintain standing without constant support.  Pt stands in a flexed posture lacking anterior translationk  .  Ambulation/Gait                 Stairs            Wheelchair Mobility    Modified Rankin (Stroke Patients Only)       Balance Overall balance assessment: Needs assistance Sitting-balance support: Feet supported;Bilateral upper extremity supported Sitting balance-Leahy Scale: Poor Sitting balance - Comments: pt intially max A to maintain upright sitting balance, but progressed to min guard  Postural control: Left lateral lean                          Cognition Arousal/Alertness: Lethargic Behavior During Therapy: Flat affect                        Exercises      General Comments        Pertinent Vitals/Pain Pain Assessment: Faces Faces Pain Scale: Hurts a little bit Pain Location: L shoulder Pain Descriptors / Indicators: Grimacing Pain Intervention(s): Limited activity within patient's tolerance    Home Living                      Prior Function            PT Goals (current goals can now be found in the care plan section) Acute Rehab PT Goals Patient Stated Goal: unable to state PT Goal Formulation: Patient unable to participate in goal setting Time For Goal Achievement: 03/29/16 Potential to Achieve Goals:  Fair    Frequency    Min 2X/week      PT Plan      Co-evaluation             End of Session Equipment Utilized During Treatment: Gait belt Activity Tolerance: Patient limited by lethargy Patient left: in bed;with call bell/phone within reach;with bed alarm set     Time: [id number] PT Time Calculation (min) (ACUTE ONLY): 24 min  Charges:  $Therapeutic Activity: 23-37 mins                    G Codes:      Sheila CortiMary Terria Cisneros 03/20/2016, 5:25 PM  Sheila Cisneros, Leda GauzeSPTA [phone]

## 2016-03-20 NOTE — Discharge Summary (Addendum)
[facility]  Physician Discharge Summary   Patient ID: REGINALD MANGELS MRN: [id number] DOB/AGE: 11/22/1940 75 y.o.  Admit date: 03/05/2016 Discharge date: 03/20/2016  PCP: Sanda Linger, MD  DISCHARGE DIAGNOSES:  Active Problems:   Aspiration pneumonia (HCC)   Acute respiratory failure with hypoxia (HCC)   AKI (acute kidney injury) (HCC)   Pressure injury of skin   Acute on chronic respiratory failure with hypoxia (HCC)   Pneumonia of both lower lobes due to methicillin resistant Staphylococcus aureus (MRSA) (HCC)   Panlobular emphysema (HCC)   Acute pulmonary edema (HCC)   Chronic diastolic CHF (congestive heart failure) (HCC)   Acute kidney injury (HCC)   Oropharyngeal dysphagia   Aspiration into airway   Palliative care encounter   Goals of care, counseling/discussion   RECOMMENDATIONS FOR OUTPATIENT FOLLOW UP: 1. Would recommend patient be followed by palliative medicine at the skilled nursing facility 2. All medications to be given through the feeding tube 3. CBC and basic metabolic panel before the end of this week  DISCHARGE CONDITION: fair  Diet recommendation: Tube feedings. Jevity at 55 ML per hour  Filed Weights   03/18/16 0500 03/19/16 0500 03/20/16 0430  Weight: 49 kg (108 lb 1.6 oz) 47 kg (103 lb 11.2 oz) 47.7 kg (105 lb 1.6 oz)    INITIAL HISTORY: 75 year old African-American female with a past medical history of dementia, GERD, type 2 diabetes, COPD, hypertension, chronic low back pain, hepatitis presented from an assisted living facility. At baseline, patient is nonverbal and has had multiple aspiration events in past. She was found to be in acute respiratory failure. She had to be intubated in the emergency department and was admitted to the ICU. She was stabilized, extubated and then transferred to the floor. Dysphagia is the main active issue. Patient seen by palliative medicine. They have discussed with power of attorney. Power of attorney wanted  to proceed with feeding tube which was placed on 12/4  Consultants: Palliative medicine. Interventional radiology  Procedures:  Intubation  Transthoracic echocardiogram Study Conclusions  - Left ventricle: The cavity size was normal. Wall thickness was   normal. Systolic function was normal. The estimated ejection   fraction was in the range of 55% to 60%. Wall motion was normal;   there were no regional wall motion abnormalities. Doppler   parameters are consistent with abnormal left ventricular   relaxation (grade 1 diastolic dysfunction). Impressions: - Normal LV systolic function; grade 1 diastolic dysfunction.  PEG tube placement 12/4  Antibiotics: Completed course of antibiotics. She was on the following at various times during this hospital stay: Unasyn, cefepime and vancomycin   HOSPITAL COURSE:   Acute on Chronic Respiratory Failurewith hypoxia/ positive MRSA in sputum/Aspiration PNA Her presentation was most likely secondary to aspiration, although she was found to have MRSA in sputum. She has completed 10 days of treatment with vancomycin. Patient expected to continue to have recurrent aspiration pneumonia. Currently, appears to be stable from a respiratory standpoint.  Dysphagia (Multiple aspiration events)  Patient failed swallow study on 11/28. NPO. Palliative medicine was consulted. Palliative medicine discussed with patient's power of attorney. After long discussions over the phone power of attorney mentioning that he wants everything done for the patient based on her previous wishes. In view of this interventional radiology was consulted for PEG tube placement, even though this is not ideal for this patient. Would not feed her orally due to high aspiration risk. PEG tube was placed on 12/4. Feeds to be initiated  12/5. If she tolerates her feeds she could be discharged to SNF tomorrow. Dietitian consulted.  Abnormal density in the left lung base Incidentally  noted on CT abdomen. Most likely this is an inflammatory process. Patient does have a history of bronchiectasis. Similar, although less severe findings, noted on previous imaging studies including a CT scan from May 2017. These findings are thought to be secondary to chronic aspiration. At this time, we will not pursue any further investigations. This can be further evaluated as an outpatient, although patient's poor long-term prognosis and dementia argues against further evaluation.  History of COPD, Bronchiectasis Stable. Continue home medications.  Pulm edema.  Resolved. Echocardiogram showed normal systolic function. Grade 1 diastolic dysfunction was noted.  S/P hypotension 2/2 hypovoplemia and sepsis Resolved. Now blood pressure in the hypertensive range. Okay to resume verapamil.  Chronic diastolic CHF Seems well compensated.  Hypokalemia and hypomagnesemia Replace potassium and magnesium.   Acute kidney injury Thought to be secondary to hypovolemia. Now resolved.  DM Type 2 controlled with complications Patient had a few episodes of hypoglycemia due to poor to no oral intake. She was placed on a D5 infusion. Once tube feeds are initiated D5 will be discontinued.  Dementia/Encephalopathy secondary to PNA Possibly at baseline now.  Overall, stable. If she tolerates her tube feedings, she could be discharged to SNF tomorrow.    PERTINENT LABS:  The results of significant diagnostics from this hospitalization (including imaging, microbiology, ancillary and laboratory) are listed below for reference.     Labs: Basic Metabolic Panel:  Recent Labs Lab 03/14/16 0634 03/15/16 0416 03/17/16 0421 03/17/16 0919 03/17/16 1500 03/18/16 0805 03/19/16 1207 03/20/16 0516  NA 136 138 139  --  138 140 137 138  K 5.2* 3.4* 2.8*  --  3.3* 3.3* 3.1* 3.4*  CL 103 105 107  --  109 108 109 108  CO2 22 23 23   --  20* 20* 22 21*  GLUCOSE 125* 93 48*  --  114* 99 196* 122*    BUN 9 8 7   --  6 <5* 5* <5*  CREATININE 1.03* 0.97 1.09*  --  1.02* 0.90 0.96 1.02*  CALCIUM 9.7 9.9 10.0  --  10.2 10.6* 10.2 10.3  MG 1.7 1.6*  --  1.5*  --  1.7  --  1.4*  PHOS 3.3 3.6  --   --   --   --   --   --    CBC:  Recent Labs Lab 03/14/16 0634 03/15/16 0416 03/17/16 0421 03/19/16 1207 03/20/16 0516  WBC 13.5* 11.9* 11.4* 10.1 7.5  NEUTROABS  --   --   --  6.9  --   HGB 10.9* 11.0* 10.4* 9.9* 9.9*  HCT 33.6* 34.1* 32.3* 30.9* 31.0*  MCV 86.4 85.7 85.4 85.1 85.4  PLT 458* 466* 506* 393 369   BNP: BNP (last 3 results)  Recent Labs  03/05/16 1826  BNP 33.0    CBG:  Recent Labs Lab 03/19/16 2031 03/20/16 0023 03/20/16 0417 03/20/16 0758 03/20/16 1218  GLUCAP 102* 113* 123* 84 117*     IMAGING STUDIES Ct Abdomen Wo Contrast  Result Date: 03/16/2016 CLINICAL DATA:  Gastrostomy tube evaluation EXAM: CT ABDOMEN AND PELVIS WITHOUT CONTRAST TECHNIQUE: Multidetector CT imaging of the abdomen and pelvis was performed following the standard protocol without IV contrast. COMPARISON:  06/04/2013 FINDINGS: Lower chest: There is masslike consolidation at the left lung base. There is an associated pleural effusion which appears loculated. The  masslike area in the left lower lobe measures 4.0 x 4.7 cm on image 6. It is partially imaged. Compare to a CT chest dated 08/18/2015, previously, this abnormality at the appearance of acute consolidation. On today's study, there are areas of low density without air bronchograms. This is atypical with respect to an inflammatory process in raises suspicion of other etiology suggest malignancy. Subsegmental atelectasis at the right lung base. Hepatobiliary: Stable small hypodensity in the right lobe of the liver, measuring 1.4 cm. Gallbladder contains small stones. Pancreas: Atrophic. Spleen: Unremarkable. Adrenals/Urinary Tract: No hydronephrosis or obvious mass. Cortical calcification along the g of the right kidney is stable and  nonspecific. Adrenal glands are within normal limits. Stomach/Bowel: The body of the stomach is positioned between the left lobe of the liver and transverse colon. It is opposed to the anterior abdominal wall. Vascular/Lymphatic: No evidence of aortic aneurysm. No obvious adenopathy. Other: No free-fluid. Musculoskeletal: Advanced levoscoliosis with the apex at L3-4. There is severe degenerative disc disease at L3-4 and L4-5. No definite acute compression deformity. Degenerative disc disease also occurs in the lower thoracic spine. IMPRESSION: There is favorable anatomy for gastrostomy tube placement as described above Masslike consolidation at the left lung base. This abnormality is partially imaged and measures up to 4.7 cm. Differential diagnosis includes an inflammatory process, round atelectasis, or malignancy. Consider PET-CT for further characterization. Cholelithiasis. Electronically Signed   By: Jolaine Click M.D.   On: 03/16/2016 08:26   Ir Gastrostomy Tube Mod Sed  Result Date: 03/19/2016 CLINICAL DATA:  Hiatal hernia. Dysphagia, needs enteral feeding support. EXAM: PERC PLACEMENT GASTROSTOMY FLUOROSCOPY TIME:  , 33 mGy TECHNIQUE: The procedure, risks, benefits, and alternatives were explained to the patient and family. Questions regarding the procedure were encouraged and answered. The family understands and consents to the procedure. As antibiotic prophylaxis, vancomycin 1 g was ordered pre-procedure and administered intravenously within one hour of incision. A safe percutaneous approach to the stomach had been confirmed on recent abdomen CT. Residual oral contrast material showed the decompressed colon in safe position for percutaneous approach. A 5 French angiographic catheter was placed as orogastric tube. The upper abdomen was prepped with Betadine, draped in usual sterile fashion, and infiltrated locally with 1% lidocaine. Intravenous Fentanyl and Versed were administered as conscious  sedation during continuous monitoring of the patient's level of consciousness and physiological / cardiorespiratory status by the radiology RN, with a total moderate sedation time of 26 minutes. Under fluoroscopy, the orogastric tube was advanced from the gastric hiatal hernia into the lumen of the body of the stomach using an angled Glidewire and a 5 French C2 catheter.Stomach was insufflated using air through the orogastric tube. An 41 French sheath needle was advanced percutaneously into the gastric lumen under fluoroscopy. Gas could be aspirated and a small contrast injection confirmed intraluminal spread. The sheath was exchanged over a guidewire for a 9 [place] vascular sheath, through which the snare device was advanced and used to snare a guidewire passed through the orogastric tube. This was withdrawn, and the snare attached to the 20 French pull-through gastrostomy tube, which was advanced antegrade, positioned with the internal bumper securing the anterior gastric wall to the anterior abdominal wall. Small contrast injection confirms appropriate positioning. The external bumper was applied and the catheter was flushed. COMPLICATIONS: COMPLICATIONS none IMPRESSION: 1. Technically successful 20 French pull-through gastrostomy placement under fluoroscopy. Electronically Signed   By: Corlis Leak M.D.   On: 03/19/2016 15:37   Dg Chest [facility]  1 View  Result Date: 03/10/2016 CLINICAL DATA:  Respiratory failure EXAM: PORTABLE CHEST 1 VIEW COMPARISON:  03/09/2016 FINDINGS: Endotracheal tube and nasogastric catheter are again seen and stable. The overall inspiratory effort is poor with increasing left basilar infiltrate. No pneumothorax is noted. No bony abnormality is seen. IMPRESSION: Increasing left basilar infiltrate. Electronically Signed   By: Alcide Clever M.D.   On: 03/10/2016 07:47   Dg Chest Port 1 View  Result Date: 03/09/2016 CLINICAL DATA:  Intubated, ventilator EXAM: PORTABLE CHEST 1 VIEW  COMPARISON:  03/08/2016 FINDINGS: Endotracheal tube and NG tube remain in place, unchanged. Left basilar atelectasis or infiltrate is stable. No confluent opacity on the right. Heart is normal size. No visible effusions. No acute bony abnormality. IMPRESSION: Stable left lower lobe atelectasis or infiltrate. Electronically Signed   By: Charlett Nose M.D.   On: 03/09/2016 07:45   Dg Chest Port 1 View  Result Date: 03/08/2016 CLINICAL DATA:  Endotracheal intubation. EXAM: PORTABLE CHEST 1 VIEW COMPARISON:  One-view chest x-ray 03/07/2016 FINDINGS: Endotracheal tube is stable. NG tube terminates in the stomach. The heart is mildly enlarged. Left greater than right lower lobe airspace disease persists. The upper lung fields are clear. The visualized soft tissues and bony thorax are otherwise unremarkable. IMPRESSION: 1. The support apparatus is stable. 2. Bibasilar airspace disease per cysts, left greater than right. This is concerning for pneumonia or aspiration. There is no significant interval change. Electronically Signed   By: Marin Roberts M.D.   On: 03/08/2016 07:09   Dg Chest Port 1 View  Result Date: 03/07/2016 CLINICAL DATA:  Placement of endotracheal tube EXAM: PORTABLE CHEST 1 VIEW COMPARISON:  Portable chest x-ray of 03/06/2016 FINDINGS: The tip of the endotracheal tube is approximately 2.3 cm above the carina. There is persistent opacity at the left lung base and to lesser degree the right lung base suspicious for pneumonia. No definite pleural effusion is seen. NG tube tip is noted to a point just below the gastroesophageal junction. Heart size is stable. IMPRESSION: 1. Endotracheal tube tip 2.3 cm above the carina. 2. Basilar opacities left-greater-than-right suspicious for pneumonia. Electronically Signed   By: Dwyane Dee M.D.   On: 03/07/2016 07:40   Dg Chest Port 1 View  Result Date: 03/06/2016 CLINICAL DATA:  75 year old female with aspiration pneumonia. Subsequent encounter.  EXAM: PORTABLE CHEST 1 VIEW COMPARISON:  03/05/2016 FINDINGS: Endotracheal tube tip 3.1 cm above the carina. Nasogastric tube courses below the diaphragm. Tip gastric fundus level. Bibasilar consolidation asymmetric greater on the left appears similar or slightly worse when compared to prior exam. Central pulmonary vascular prominence. No gross pneumothorax. Heart size top-normal. Superiorly located right humeral head with degenerative changes suggesting rotator cuff injury unchanged. IMPRESSION: Bibasilar consolidation asymmetric greater on the left appears similar or slightly worse when compared to prior exam. Central pulmonary vascular prominence. Electronically Signed   By: Lacy Duverney M.D.   On: 03/06/2016 07:31   Dg Chest Portable 1 View  Result Date: 03/05/2016 CLINICAL DATA:  Intubated EXAM: PORTABLE CHEST 1 VIEW COMPARISON:  03/05/2016 10 o'clock hour FINDINGS: Endotracheal tube placed with its tip 4.7 cm from the carina. NG tube placed with its tip in the fundus. Normal heart size. Hazy bilateral central and basilar consolidation is stable. No pneumothorax. IMPRESSION: Endotracheal and NG tubes placed. Stable bilateral airspace disease. Electronically Signed   By: Jolaine Click M.D.   On: 03/05/2016 12:47   Dg Chest Portable 1 View  Result Date: 03/05/2016  CLINICAL DATA:  Altered mental status, fever EXAM: PORTABLE CHEST 1 VIEW COMPARISON:  02/05/2016 FINDINGS: Heart is upper limits normal in size. There are bilateral lower lobe airspace opacities, increasing in the right base since prior study. No visible effusions or acute bony abnormality. Degenerative changes in the shoulders. IMPRESSION: Bibasilar opacities, increasing on the right since prior study concerning for pneumonia. Electronically Signed   By: Charlett Nose M.D.   On: 03/05/2016 10:49   Dg Swallowing Func-speech Pathology  Result Date: 03/12/2016 Objective Swallowing Evaluation: Type of Study: MBS-Modified Barium Swallow Study  Patient Details Name: YAMILET MCFAYDEN MRN: [id number] Date of Birth: [birth year] Today's Date: 03/12/2016 Time: SLP Start Time (ACUTE ONLY): 1056-SLP Stop Time (ACUTE ONLY): 1120 SLP Time Calculation (min) (ACUTE ONLY): 24 min Past Medical History: Past Medical History: Diagnosis Date . Altered mental status 12/09/2012 . Arthritis   "all over" (12/09/2012) . Chronic bronchitis (HCC)   "couple times/yr" (12/09/2012) . Chronic lower back pain  . COPD (chronic obstructive pulmonary disease) (HCC)  . Diabetes mellitus without complication (HCC)  . Exertional shortness of breath  . GERD (gastroesophageal reflux disease)   rolaids if needed . Gout, unspecified  . Hepatitis, unspecified   "the one that's not bad" (12/09/2012) . History of blood transfusion   "related to a surgery, I think" (12/09/2012) . Hyperlipidemia  . Hypertension  . Migraines   "in the past" (12/09/2012) . Osteoarthrosis, unspecified whether generalized or localized, unspecified site  . Other chronic sinusitis  . Pneumonia   "couple times; long time ago" (12/09/2012) . Unspecified chronic bronchitis   sees Dr. Melba Coon, last visit 08/2011, treated for E. Coli- resp. /w Ceftin, Feb. 2013 Past Surgical History: Past Surgical History: Procedure Laterality Date . ABDOMINAL HYSTERECTOMY    "w/right ovary" (12/09/2012) . APPENDECTOMY   . BREAST BIOPSY Right  . CARPAL TUNNEL RELEASE  09/06/2011  Procedure: CARPAL TUNNEL RELEASE;  Surgeon: Kennieth Rad, MD;  Location: [facility] OR;  Service: Orthopedics;  Laterality: Right; . ESOPHAGOGASTRODUODENOSCOPY N/A 06/04/2013  Procedure: ESOPHAGOGASTRODUODENOSCOPY (EGD);  Surgeon: Graylin Shiver, MD;  Location: [facility] ENDOSCOPY;  Service: Endoscopy;  Laterality: N/A; . SHOULDER ARTHROSCOPY W/ ROTATOR CUFF REPAIR Right  . TONSILLECTOMY   . TUBAL LIGATION   HPI: 75 year old female with PMH of GERD, Dementia, Type 2 DM, COPD, HTN, recurrent PNA and Hyperlipidemia. Pt had recent admission for PNA from 10/22-10/26 and presented for this admission  with PNA. Intubated on 11/20 and extubated on 11/26. CXR show increasing left basilar infiltrate.  Subjective: pt alert, participatory Assessment / Plan / Recommendation CHL IP CLINICAL IMPRESSIONS 03/12/2016 Therapy Diagnosis Severe pharyngeal phase dysphagia;Mild oral phase dysphagia Clinical Impression Pt presents with mild oral and severe pharyngeal dysphagia. She had increasing lingual residue as PO trials were given. Oral holding was displayed during trials of pureed solids and Mod cues were needed to initiate a swallow; however, pt's airway remained protected. Pt presented with a delay in swallow initiation to the valleculae, reduced airway closure and pharyngeal peristalsis which resulted in events of silent penetration/aspiration with thin and nectar thick liquid trials. Despite Mod cueing, pt was unable to perform compensatory startegies to aid in airway protection (i.e. chin tuck, cued cough). Vallecular residue was observed throughout MBS that was minimized when pt spontaneously had additional swallows. Pt had a coordinated and timely swallow with honey thick liquids but vallecular residuals were silently penetrated and ultimately aspirated following the swallow. Although pt had reduced sensation she did reflexively cough following  aspiration of honey thick liquids. Recommend pt remain NPO except for medications crushed in puree because of the increased time and cueing needed to eliminate oral holding observed with puree consistencies. Per chart review, pt's recent barium swallow (02/09/16) showed no events of aspiration, thus her performance on MBS may likely be due to her prolonged intubation which means there is potential for improvement over the next few days. Will continue to follow acutely and administer PO trials at bedside. Impact on safety and function Severe aspiration risk   CHL IP TREATMENT RECOMMENDATION 03/12/2016 Treatment Recommendations Therapy as outlined in treatment plan below    Prognosis 03/12/2016 Prognosis for Safe Diet Advancement Good Barriers to Reach Goals Cognitive deficits;Severity of deficits Barriers/Prognosis Comment -- CHL IP DIET RECOMMENDATION 03/12/2016 SLP Diet Recommendations NPO except meds Liquid Administration via -- Medication Administration Crushed with puree Compensations -- Postural Changes Seated upright at 90 degrees   CHL IP OTHER RECOMMENDATIONS 03/12/2016 Recommended Consults -- Oral Care Recommendations Oral care QID Other Recommendations --   CHL IP FOLLOW UP RECOMMENDATIONS 03/12/2016 Follow up Recommendations Other (comment)   CHL IP FREQUENCY AND DURATION 03/12/2016 Speech Therapy Frequency (ACUTE ONLY) min 2x/week Treatment Duration 2 weeks      CHL IP ORAL PHASE 03/12/2016 Oral Phase Impaired Oral - Pudding Teaspoon -- Oral - Pudding Cup -- Oral - Honey Teaspoon -- Oral - Honey Cup Lingual/palatal residue;Delayed oral transit Oral - Nectar Teaspoon -- Oral - Nectar Cup Lingual/palatal residue Oral - Nectar Straw Lingual/palatal residue Oral - Thin Teaspoon Lingual/palatal residue Oral - Thin Cup Lingual/palatal residue Oral - Thin Straw -- Oral - Puree Lingual/palatal residue;Delayed oral transit;Holding of bolus Oral - Mech Soft -- Oral - Regular -- Oral - Multi-Consistency -- Oral - Pill -- Oral Phase - Comment --  CHL IP PHARYNGEAL PHASE 03/12/2016 Pharyngeal Phase Impaired Pharyngeal- Pudding Teaspoon -- Pharyngeal -- Pharyngeal- Pudding Cup -- Pharyngeal -- Pharyngeal- Honey Teaspoon -- Pharyngeal -- Pharyngeal- Honey Cup Delayed swallow initiation-vallecula;Pharyngeal residue - valleculae;Reduced airway/laryngeal closure;Reduced pharyngeal peristalsis;Penetration/Apiration after swallow Pharyngeal Material enters airway, remains ABOVE vocal cords and not ejected out;Material enters airway, passes BELOW cords without attempt by patient to eject out (silent aspiration) Pharyngeal- Nectar Teaspoon -- Pharyngeal -- Pharyngeal- Nectar Cup Delayed  swallow initiation-vallecula;Reduced airway/laryngeal closure;Penetration/Aspiration before swallow;Pharyngeal residue - valleculae;Reduced pharyngeal peristalsis Pharyngeal Material enters airway, remains ABOVE vocal cords and not ejected out Pharyngeal- Nectar Straw Delayed swallow initiation-vallecula;Penetration/Aspiration before swallow;Reduced pharyngeal peristalsis;Reduced airway/laryngeal closure;Pharyngeal residue - valleculae;Compensatory strategies attempted (with notebox) Pharyngeal Material enters airway, remains ABOVE vocal cords and not ejected out;Material enters airway, passes BELOW cords without attempt by patient to eject out (silent aspiration) Pharyngeal- Thin Teaspoon Delayed swallow initiation-vallecula;Reduced airway/laryngeal closure;Reduced pharyngeal peristalsis;Pharyngeal residue - valleculae;Penetration/Aspiration before swallow Pharyngeal Material enters airway, remains ABOVE vocal cords then ejected out Pharyngeal- Thin Cup Delayed swallow initiation-vallecula;Reduced pharyngeal peristalsis;Reduced airway/laryngeal closure;Penetration/Aspiration before swallow;Pharyngeal residue - valleculae Pharyngeal Material enters airway, passes BELOW cords without attempt by patient to eject out (silent aspiration) Pharyngeal- Thin Straw -- Pharyngeal -- Pharyngeal- Puree Delayed swallow initiation-vallecula;Reduced pharyngeal peristalsis;Reduced airway/laryngeal closure Pharyngeal -- Pharyngeal- Mechanical Soft -- Pharyngeal -- Pharyngeal- Regular -- Pharyngeal -- Pharyngeal- Multi-consistency -- Pharyngeal -- Pharyngeal- Pill -- Pharyngeal -- Pharyngeal Comment --  CHL IP CERVICAL ESOPHAGEAL PHASE 03/12/2016 Cervical Esophageal Phase WFL Pudding Teaspoon -- Pudding Cup -- Honey Teaspoon -- Honey Cup -- Nectar Teaspoon -- Nectar Cup -- Nectar Straw -- Thin Teaspoon -- Thin Cup -- Thin Straw -- Puree -- Mechanical Soft -- Regular --  Multi-consistency -- Pill -- Cervical Esophageal Comment -- No  flowsheet data found. Maxcine Hamaiewonsky, Laura 03/12/2016, 3:17 PM  Note populated for Jearl Klinefelteragan Moore, student SLP Maxcine HamLaura Paiewonsky, M.A. CCC-SLP [phone]              DISPOSITION: SNF  Discharge Instructions    Call MD for:  difficulty breathing, headache or visual disturbances    Complete by:  As directed    Call MD for:  extreme fatigue    Complete by:  As directed    Call MD for:  persistant dizziness or light-headedness    Complete by:  As directed    Call MD for:  persistant nausea and vomiting    Complete by:  As directed    Call MD for:  severe uncontrolled pain    Complete by:  As directed    Call MD for:  temperature >100.4    Complete by:  As directed    Discharge instructions    Complete by:  As directed    See instructions on the discharge summary  You were cared for by a hospitalist during your hospital stay. If you have any questions about your discharge medications or the care you received while you were in the hospital after you are discharged, you can call the unit and asked to speak with the hospitalist on call if the hospitalist that took care of you is not available. Once you are discharged, your primary care physician will handle any further medical issues. Please note that NO REFILLS for any discharge medications will be authorized once you are discharged, as it is imperative that you return to your primary care physician (or establish a relationship with a primary care physician if you do not have one) for your aftercare needs so that they can reassess your need for medications and monitor your lab values. If you do not have a primary care physician, you can call [phone] for a physician referral.      ALLERGIES:  Allergies  Allergen Reactions  . Codeine Rash and Other (See Comments)     angioedema  . Neomycin Itching  . Penicillins Itching and Rash       . Tetanus Toxoids Rash  . Diphtheria Toxoid-Containing Vaccines Other (See Comments)  . Levofloxacin Rash      Current Discharge Medication List    START taking these medications   Details  Nutritional Supplements (FEEDING SUPPLEMENT, JEVITY 1.2 CAL,) LIQD Place 1,000 mLs into feeding tube continuous. Refills: 0      CONTINUE these medications which have CHANGED   Details  acetaminophen (TYLENOL) 500 MG tablet Place 1 tablet (500 mg total) into feeding tube every 6 (six) hours as needed. For pain Qty: 30 tablet, Refills: 0    aspirin EC 81 MG tablet Take 1 tablet (81 mg total) by mouth daily.    donepezil (ARICEPT) 5 MG tablet TAKE 1 TABLET (5 MG TOTAL) BY tube AT BEDTIME. Qty: 90 tablet, Refills: 3    Multiple Vitamin (MULTIVITAMIN) capsule Place 1 capsule into feeding tube daily.    simvastatin (ZOCOR) 20 MG tablet Place 1 tablet (20 mg total) into feeding tube at bedtime. Qty: 90 tablet, Refills: 3    venlafaxine (EFFEXOR) 37.5 MG tablet Place 1 tablet (37.5 mg total) into feeding tube See admin instructions. Takes 1 capsule daily, then can take 1 addt'l cap as needed for hot flashes    verapamil (COVERA HS) 240 MG (CO) 24 hr tablet 240mg  daily at bedtime via  Tube. Qty: 90 tablet, Refills: 3      CONTINUE these medications which have NOT CHANGED   Details  Ascorbic Acid (VITAMIN C) 500 MG tablet Take 500 mg by mouth daily.      cholecalciferol (VITAMIN D) 1000 UNITS tablet Take 1,000 Units by mouth daily.    ipratropium-albuterol (DUONEB) 0.5-2.5 (3) MG/3ML SOLN Take 3 mLs by nebulization every 6 (six) hours as needed (for shortness of breath).    Tiotropium Bromide-Olodaterol (STIOLTO RESPIMAT) 2.5-2.5 MCG/ACT AERS Inhale 2 puffs into the lungs daily. Qty: 4 g, Refills: 11   Associated Diagnoses: Chronic obstructive pulmonary disease, unspecified copd, unspecified chronic bronchitis type      STOP taking these medications     albuterol (PROVENTIL) (2.5 MG/3ML) 0.083% nebulizer solution      dextromethorphan-guaiFENesin (MUCINEX DM) 30-600 MG 12hr tablet      OVER  THE COUNTER MEDICATION      OVER THE COUNTER MEDICATION          Follow-up Information    Sanda Linger, MD. Schedule an appointment as soon as possible for a visit in 1 week(s).   Specialty:  Internal Medicine Contact information: [address] [place] [address] [phone]           TOTAL DISCHARGE TIME: 35 mins  [facility]  [facility] Pager [phone]  03/20/2016, 3:11 PM

## 2016-03-20 NOTE — Progress Notes (Signed)
[facility] PROGRESS NOTE  Sheila Cisneros EAV:[id number] DOB: Sep 07, 1940 DOA: 03/05/2016  PCP: Sanda Linger, MD  Brief History/Interval Summary: 75 year old African-American female with a past medical history of dementia, GERD, type 2 diabetes, COPD, hypertension, chronic low back pain, hepatitis presented from an assisted living facility. At baseline, patient is nonverbal and has had multiple aspiration events in past. She was found to be in acute respiratory failure. She had to be intubated in the emergency department and was admitted to the ICU. She was stabilized, extubated and then transferred to the floor. Dysphagia is the main active issue. Patient seen by palliative medicine. They have discussed with power of attorney. Power of attorney wanted to proceed with feeding tube which was placed on 12/4  Reason for Visit: Aspiration pneumonia  Consultants: Palliative medicine. Interventional radiology  Procedures:  Intubation  PEG tube placement 12/4  Antibiotics: Completed course of antibiotics. She was on the following at various times during this hospital stay: Unasyn, cefepime and vancomycin  Subjective/Interval History: Patient remains confused and distracted.   ROS: Unable to do due to her dementia  Objective:  Vital Signs  Vitals:   03/19/16 1154 03/19/16 2145 03/19/16 2146 03/20/16 0430  BP: (!) 152/69 (!) 152/82  (!) 168/86  Pulse: 72 (!) 103 95 89  Resp: 14 18 20 18   Temp: 98.6 F (37 C) 98.6 F (37 C)  99.5 F (37.5 C)  TempSrc: Axillary Oral  Axillary  SpO2: 100% 98% 100% 99%  Weight:    47.7 kg (105 lb 1.6 oz)  Height:        Intake/Output Summary (Last 24 hours) at 03/20/16 0928 Last data filed at 03/19/16 1200  Gross per 24 hour  Intake                0 ml  Output                0 ml  Net                0 ml   Filed Weights   03/18/16 0500 03/19/16 0500 03/20/16 0430  Weight: 49 kg (108 lb 1.6 oz) 47 kg (103 lb 11.2 oz) 47.7 kg (105 lb 1.6  oz)    General appearance: alert and Distracted.  Resp: Diminished air entry at the bases. No crackles, wheezing or rhonchi. Cardio: regular rate and rhythm, S1, S2 normal, no murmur, click, rub or gallop GI: soft, non-tender; bowel sounds normal; no masses,  no organomegaly Neurologic: Awake. Not cooperative with examination. Moves all extremities.  Lab Results:  Data Reviewed: I have personally reviewed following labs and imaging studies  CBC:  Recent Labs Lab 03/14/16 0634 03/15/16 0416 03/17/16 0421 03/19/16 1207 03/20/16 0516  WBC 13.5* 11.9* 11.4* 10.1 7.5  NEUTROABS  --   --   --  6.9  --   HGB 10.9* 11.0* 10.4* 9.9* 9.9*  HCT 33.6* 34.1* 32.3* 30.9* 31.0*  MCV 86.4 85.7 85.4 85.1 85.4  PLT 458* 466* 506* 393 369    Basic Metabolic Panel:  Recent Labs Lab 03/14/16 0634 03/15/16 0416 03/17/16 0421 03/17/16 0919 03/17/16 1500 03/18/16 0805 03/19/16 1207 03/20/16 0516  NA 136 138 139  --  138 140 137 138  K 5.2* 3.4* 2.8*  --  3.3* 3.3* 3.1* 3.4*  CL 103 105 107  --  109 108 109 108  CO2 22 23 23   --  20* 20* 22 21*  GLUCOSE 125* 93 48*  --  114* 99 196* 122*  BUN 9 8 7   --  6 <5* 5* <5*  CREATININE 1.03* 0.97 1.09*  --  1.02* 0.90 0.96 1.02*  CALCIUM 9.7 9.9 10.0  --  10.2 10.6* 10.2 10.3  MG 1.7 1.6*  --  1.5*  --  1.7  --  1.4*  PHOS 3.3 3.6  --   --   --   --   --   --     GFR: Estimated Creatinine Clearance: 36.4 mL/min (by C-G formula based on SCr of 1.02 mg/dL (H)).  CBG:  Recent Labs Lab 03/19/16 1744 03/19/16 2031 03/20/16 0023 03/20/16 0417 03/20/16 0758  GLUCAP 112* 102* 113* 123* 84     No results found for this or any previous visit (from the past 240 hour(s)).    Radiology Studies: Ir Gastrostomy Tube Mod Sed  Result Date: 03/19/2016 CLINICAL DATA:  Hiatal hernia. Dysphagia, needs enteral feeding support. EXAM: PERC PLACEMENT GASTROSTOMY FLUOROSCOPY TIME:  18minute, 33 mGy TECHNIQUE: The procedure, risks, benefits, and  alternatives were explained to the patient and family. Questions regarding the procedure were encouraged and answered. The family understands and consents to the procedure. As antibiotic prophylaxis, vancomycin 1 g was ordered pre-procedure and administered intravenously within one hour of incision. A safe percutaneous approach to the stomach had been confirmed on recent abdomen CT. Residual oral contrast material showed the decompressed colon in safe position for percutaneous approach. A 5 French angiographic catheter was placed as orogastric tube. The upper abdomen was prepped with Betadine, draped in usual sterile fashion, and infiltrated locally with 1% lidocaine. Intravenous Fentanyl and Versed were administered as conscious sedation during continuous monitoring of the patient's level of consciousness and physiological / cardiorespiratory status by the radiology RN, with a total moderate sedation time of 26 minutes. Under fluoroscopy, the orogastric tube was advanced from the gastric hiatal hernia into the lumen of the body of the stomach using an angled Glidewire and a 5 French C2 catheter.Stomach was insufflated using air through the orogastric tube. An 2718 French sheath needle was advanced percutaneously into the gastric lumen under fluoroscopy. Gas could be aspirated and a small contrast injection confirmed intraluminal spread. The sheath was exchanged over a guidewire for a 9 [place]French vascular sheath, through which the snare device was advanced and used to snare a guidewire passed through the orogastric tube. This was withdrawn, and the snare attached to the 20 French pull-through gastrostomy tube, which was advanced antegrade, positioned with the internal bumper securing the anterior gastric wall to the anterior abdominal wall. Small contrast injection confirms appropriate positioning. The external bumper was applied and the catheter was flushed. COMPLICATIONS: COMPLICATIONS none IMPRESSION: 1. Technically  successful 20 French pull-through gastrostomy placement under fluoroscopy. Electronically Signed   By: Corlis Leak  Hassell M.D.   On: 03/19/2016 15:37     Medications:  Scheduled: . budesonide (PULMICORT) nebulizer solution  0.5 mg Nebulization BID  . heparin  5,000 Units Subcutaneous Q8H  . insulin aspart  0-9 Units Subcutaneous Q4H  . mouth rinse  15 mL Mouth Rinse BID  . venlafaxine  37.5 mg Oral Q breakfast   Continuous: . dextrose 5 % and 0.45% NaCl 1,000 mL infusion 50 mL/hr at 03/18/16 1054   ZOX:WRUEAVPRN:sodium chloride, hydrALAZINE, ipratropium-albuterol, ondansetron (ZOFRAN) IV  Assessment/Plan:  Active Problems:   Aspiration pneumonia (HCC)   Acute respiratory failure with hypoxia (HCC)   AKI (acute kidney injury) (HCC)   Pressure injury of skin   Acute  on chronic respiratory failure with hypoxia (HCC)   Pneumonia of both lower lobes due to methicillin resistant Staphylococcus aureus (MRSA) (HCC)   Panlobular emphysema (HCC)   Acute pulmonary edema (HCC)   Chronic diastolic CHF (congestive heart failure) (HCC)   Acute kidney injury (HCC)   Oropharyngeal dysphagia   Aspiration into airway   Palliative care encounter   Goals of care, counseling/discussion    Acute on Chronic Respiratory Failure with hypoxia/ positive MRSA in sputum/Aspiration PNA Her presentation was most likely secondary to aspiration, although she was found to have MRSA in sputum. She has completed 10 days of treatment with vancomycin. Patient expected to continue to have recurrent aspiration pneumonia. Currently, appears to be stable from a respiratory standpoint.  Dysphagia (Multiple aspiration events)  Patient failed swallow study on 11/28. Nothing by mouth for now. Palliative medicine was consulted. Palliative medicine discussed with patient's power of attorney. After long discussions over the phone power of attorney mentioning that he wants everything done for the patient based on her previous wishes. In view  of this interventional radiology was consulted for PEG tube placement, even though this is not ideal for this patient. Would not feed her orally due to high aspiration risk. PEG tube was placed on 12/4. Feeds to be initiated sometime today. If she tolerates her feeds she could be discharged to SNF tomorrow.  Abnormal density in the left lung base Incidentally noted on CT abdomen. Most likely this is an inflammatory process. Patient does have a history of bronchiectasis. Similar, although less severe findings, noted on previous imaging studies including a CT scan from May 2017. These findings are thought to be secondary to chronic aspiration. At this time, we will not pursue any further investigations. This can be further evaluated as an outpatient, although patient's poor long-term prognosis and dementia argues against further evaluation.  History of COPD, Bronchiectasis Stable. Continue Pulmicort. Continue nebulizer treatments.   Pulm edema.  Resolved  S/P hypotension 2/2 hypovoplemia and sepsis Resolved  Chronic diastolic CHF Stable. Strict ins and outs, daily weights.  Hypokalemia Replace potassium.   AKI  Thought to be secondary to hypovolemia. Now resolved.  DM Type 2 controlled with complications No further episodes of hypoglycemia. Continue D5 infusion until enteral feeds can be initiated.  Dementia/Encephalopathy secondary to PNA;  Possibly at baseline now.  DVT Prophylaxis: Heparin subcutaneously    Code Status: Full code for now  Family Communication: No family at bedside. There is a nephew who is the power of attorney. Disposition Plan: She will go to skilled nursing facility at the time of discharge. Plan for discharge tomorrow.    LOS: 15 days   [facility],Jhana Giarratano  [facility] Pager [phone] 03/20/2016, 9:28 AM  If 7PM-7AM, please contact night-coverage at [email or web address], password [email or web address]

## 2016-03-21 DIAGNOSIS — R1312 Dysphagia, oropharyngeal phase: Secondary | ICD-10-CM | POA: Diagnosis not present

## 2016-03-21 DIAGNOSIS — J690 Pneumonitis due to inhalation of food and vomit: Secondary | ICD-10-CM | POA: Diagnosis not present

## 2016-03-21 DIAGNOSIS — E785 Hyperlipidemia, unspecified: Secondary | ICD-10-CM | POA: Diagnosis not present

## 2016-03-21 DIAGNOSIS — J15212 Pneumonia due to Methicillin resistant Staphylococcus aureus: Secondary | ICD-10-CM | POA: Diagnosis not present

## 2016-03-21 DIAGNOSIS — J189 Pneumonia, unspecified organism: Secondary | ICD-10-CM | POA: Diagnosis not present

## 2016-03-21 DIAGNOSIS — R488 Other symbolic dysfunctions: Secondary | ICD-10-CM | POA: Diagnosis not present

## 2016-03-21 DIAGNOSIS — J810 Acute pulmonary edema: Secondary | ICD-10-CM | POA: Diagnosis not present

## 2016-03-21 DIAGNOSIS — F0150 Vascular dementia without behavioral disturbance: Secondary | ICD-10-CM | POA: Diagnosis not present

## 2016-03-21 DIAGNOSIS — I10 Essential (primary) hypertension: Secondary | ICD-10-CM | POA: Diagnosis not present

## 2016-03-21 DIAGNOSIS — Z431 Encounter for attention to gastrostomy: Secondary | ICD-10-CM | POA: Diagnosis not present

## 2016-03-21 DIAGNOSIS — R918 Other nonspecific abnormal finding of lung field: Secondary | ICD-10-CM | POA: Diagnosis not present

## 2016-03-21 DIAGNOSIS — E119 Type 2 diabetes mellitus without complications: Secondary | ICD-10-CM | POA: Diagnosis not present

## 2016-03-21 DIAGNOSIS — E118 Type 2 diabetes mellitus with unspecified complications: Secondary | ICD-10-CM | POA: Diagnosis not present

## 2016-03-21 DIAGNOSIS — J449 Chronic obstructive pulmonary disease, unspecified: Secondary | ICD-10-CM | POA: Diagnosis not present

## 2016-03-21 DIAGNOSIS — R531 Weakness: Secondary | ICD-10-CM | POA: Diagnosis not present

## 2016-03-21 DIAGNOSIS — M6281 Muscle weakness (generalized): Secondary | ICD-10-CM | POA: Diagnosis not present

## 2016-03-21 LAB — GLUCOSE, CAPILLARY
GLUCOSE-CAPILLARY: 140 mg/dL — AB (ref 65–99)
GLUCOSE-CAPILLARY: 96 mg/dL (ref 65–99)
Glucose-Capillary: 137 mg/dL — ABNORMAL HIGH (ref 65–99)
Glucose-Capillary: 130 mg/dL — ABNORMAL HIGH (ref 65–99)

## 2016-03-21 NOTE — Care Management Important Message (Signed)
Important Message  Patient Details  Name: Sheila Cisneros MRN: [id number] Date of Birth: 06/07/1940   Medicare Important Message Given:  Yes    Epifanio LeschesCole, Zair Borawski Hudson, RN 03/21/2016, 3:04 PM

## 2016-03-21 NOTE — Care Management Note (Signed)
Case Management Note  Patient Details  Name: Tiajuana Amassrrie L Bing MRN: [id number] Date of Birth: 12-21-1940  Subjective/Objective:        Admitted with aspiration pneumonia.    Action/Plan: Plan is to d/c to SNF today. CSW managing disposition to SNF.  Expected Discharge Date:                  Expected Discharge Plan:  Skilled Nursing Facility  In-House Referral:  Clinical Social Work  Discharge planning Services  CM Consult   Status of Service:  Completed, signed off  If discussed at [employer] Length of Stay Meetings, dates discussed:    Additional Comments:  Epifanio LeschesCole, Lexany Belknap Hudson, RN 03/21/2016, 1:04 PM

## 2016-03-21 NOTE — Progress Notes (Signed)
At 3am increased jevity 10cc to 35cc/hr.  No residual when checked.  The dressing is clean dry and intact, with a binder.

## 2016-03-21 NOTE — Progress Notes (Signed)
[facility] to give [facility]

## 2016-03-21 NOTE — Progress Notes (Signed)
Patient will DC to: [facility] Anticipated DC date: 03/21/16 Family notified: Nephew Transport by: Sharin MonsPTAR   Per MD [facility]. RN, patient, patient's family, and facility notified of DC. Discharge Summary sent to facility. RN given number for report. DC packet on chart. Ambulance transport requested for patient.   CSW signing off.  Cristobal GoldmannNadia Tramaine Sauls, [place]CSWA Clinical Social Worker [phone]

## 2016-03-21 NOTE — Progress Notes (Signed)
Subjective: Patient not following commands, appears distracted.  Objective: Vitals:   03/20/16 2225 03/21/16 0430  BP:  (!) 147/64  Pulse: 84 93  Resp: 16 18  Temp:  98.1 F (36.7 C)   General: Alert and awake, distracted, not following commands. HEENT: anicteric sclera, pupils reactive to light and accommodation, EOMI CVS: S1-S2 clear, no murmur rubs or gallops Chest: clear to auscultation bilaterally, no wheezing, rales or rhonchi Abdomen: soft nontender, nondistended, normal bowel sounds, no organomegaly Extremities: no cyanosis, clubbing or edema noted bilaterally Neuro: Cranial nerves II-XII intact, no focal neurological deficits  Assessment and plan: Active Problems:   Aspiration pneumonia (HCC)   Acute respiratory failure with hypoxia (HCC)   AKI (acute kidney injury) (HCC)   Pressure injury of skin   Acute on chronic respiratory failure with hypoxia (HCC)   Pneumonia of both lower lobes due to methicillin resistant Staphylococcus aureus (MRSA) (HCC)   Panlobular emphysema (HCC)   Acute pulmonary edema (HCC)   Chronic diastolic CHF (congestive heart failure) (HCC)   Acute kidney injury (HCC)   Oropharyngeal dysphagia   Aspiration into airway   Palliative care encounter   Goals of care, counseling/discussion   Discharge done and summary dictated yesterday by my colleague Dr. Rito EhrlichKrishnan, no changes clinically.  Clint LippsMutaz A [place] Pager: [phone] 03/21/2016, 12:11 PM

## 2016-03-21 NOTE — Progress Notes (Signed)
NURSING PROGRESS NOTE  Sheila Amassrrie L Sparr [id number] Discharge Data: 03/21/2016 4:38 PM Attending Provider: Clydia LlanoMutaz Elmahi, MD WUJ:[id number]:Thomas Yetta BarreJones, MD     Sheila AmassArrie L Cisneros to be D/C'd Rehab per Arthor CaptainElmahi, MD order.  Discussed with the patient the After Visit Summary and all questions fully answered. All IV's discontinued with no bleeding noted. All belongings returned to patient for patient to take home. [name]. [facility].   Last Vital Signs:  Blood pressure 136/71, pulse 86, temperature 98.4 F (36.9 C), temperature source Oral, resp. rate 18, height 5\' 2"  (1.575 m), weight 48.5 kg (107 lb), SpO2 99 %.  Discharge Medication List   Medication List    STOP taking these medications   albuterol (2.5 MG/3ML) 0.083% nebulizer solution Commonly known as:  PROVENTIL   dextromethorphan-guaiFENesin 30-600 MG 12hr tablet Commonly known as:  MUCINEX DM   OVER THE COUNTER MEDICATION   OVER THE COUNTER MEDICATION     TAKE these medications   acetaminophen 500 MG tablet Commonly known as:  TYLENOL Place 1 tablet (500 mg total) into feeding tube every 6 (six) hours as needed. For pain What changed:  how to take this   aspirin EC 81 MG tablet Take 1 tablet (81 mg total) by mouth daily.   cholecalciferol 1000 units tablet Commonly known as:  VITAMIN D Take 1,000 Units by mouth daily.   donepezil 5 MG tablet Commonly known as:  ARICEPT TAKE 1 TABLET (5 MG TOTAL) BY tube AT BEDTIME. What changed:  See the new instructions.   feeding supplement (JEVITY 1.2 CAL) Liqd Place 1,000 mLs into feeding tube continuous.   ipratropium-albuterol 0.5-2.5 (3) MG/3ML Soln Commonly known as:  DUONEB Take 3 mLs by nebulization every 6 (six) hours as needed (for shortness of breath).   multivitamin capsule Place 1 capsule into feeding tube daily. What changed:  how to take this   simvastatin 20 MG tablet Commonly known as:  ZOCOR Place 1 tablet (20 mg  total) into feeding tube at bedtime. What changed:  how to take this   Tiotropium Bromide-Olodaterol 2.5-2.5 MCG/ACT Aers Commonly known as:  STIOLTO RESPIMAT Inhale 2 puffs into the lungs daily.   venlafaxine 37.5 MG tablet Commonly known as:  EFFEXOR Place 1 tablet (37.5 mg total) into feeding tube See admin instructions. Takes 1 capsule daily, then can take 1 addt'l cap as needed for hot flashes What changed:  how to take this   verapamil 240 MG (CO) 24 hr tablet Commonly known as:  COVERA HS 240mg  daily at bedtime via Tube. What changed:  how much to take  how to take this  when to take this  additional instructions   vitamin C 500 MG tablet Commonly known as:  ASCORBIC ACID Take 500 mg by mouth daily.

## 2016-03-22 ENCOUNTER — Non-Acute Institutional Stay (SKILLED_NURSING_FACILITY): Payer: Medicare Other | Admitting: Internal Medicine

## 2016-03-22 ENCOUNTER — Encounter: Payer: Self-pay | Admitting: Internal Medicine

## 2016-03-22 ENCOUNTER — Telehealth: Payer: Self-pay | Admitting: *Deleted

## 2016-03-22 DIAGNOSIS — R918 Other nonspecific abnormal finding of lung field: Secondary | ICD-10-CM

## 2016-03-22 DIAGNOSIS — E118 Type 2 diabetes mellitus with unspecified complications: Secondary | ICD-10-CM | POA: Diagnosis not present

## 2016-03-22 DIAGNOSIS — F0150 Vascular dementia without behavioral disturbance: Secondary | ICD-10-CM | POA: Diagnosis not present

## 2016-03-22 DIAGNOSIS — J15212 Pneumonia due to Methicillin resistant Staphylococcus aureus: Secondary | ICD-10-CM

## 2016-03-22 NOTE — Assessment & Plan Note (Signed)
Status post completion of full course of antibiotics Residual rhonchi without clinical suggestion of pneumonia

## 2016-03-22 NOTE — Assessment & Plan Note (Signed)
Findings appear to be stable on serial imaging this has been attributed to bronchiectasis due to aspiration.  Usually aspiration involves the right middle lobe or right lower lobe rather than the left;but no further evaluation was felt to be necessary. Hopefully the PEG tube will decrease the risk of recurrent aspiration

## 2016-03-22 NOTE — Assessment & Plan Note (Signed)
Although she cannot participate in history completion, she is more interactive than she was last visit. This most likely related to correction of the severe hypernatremia.

## 2016-03-22 NOTE — Patient Instructions (Signed)
See Current Assessment & Plan in Problem List under specific Diagnosis 

## 2016-03-22 NOTE — Progress Notes (Signed)
   [facility] Room: 102  Sanda Lingerhomas Jones, MD [address] [place] [id number]  Code Status:  Full Code  This is a nursing facility follow up for Nursing Facility readmission within 30 days  Interim medical record and care [facility] was updated with review of diagnostic studies and change in clinical status since last visit were documented.  HPI: The patient was hospitalized 11/20-12/5/17 for acute respiratory failure which required intubation in the emergency room . Patient was felt to have bilateral HCA Pneumonia related to aspiration in the context of dysphagia. Cultures isolated methicillin-resistant staph aureus. Antibiotics included Unasyn, cefepime, and vancomycin. CT reveal chronic changes in the left lower lobe. The patient has history of bronchiectasis related to chronic aspiration; no further evaluation was felt to be clinically indicated. Echo revealed grade 1 diastolic dysfunction Hospital course was complicated  by acute renal insufficiency, acute pulmonary edema, and pressure injury to the skin.  Patient had episodic hypoglycemia attributed to poor oral intake. She received a D5 infusion.  Admission sodium was 164, with hydration it was 138 at discharge The patient failed a swallowing study on 03/13/16  Patient was seen by palliative medicine and prognosis discussed with the healthcare power of attorney. Decision was made to proceed with a feeding tube which was placed 12/4.  Review of systems: Incomplete as the patient was essentially nonverbal. Occasionally she would say " yes" and she even said "thank you". Most of the time she did not follow commands, limiting the examination. She would not attempt to give me the date.  Physical exam:  Pertinent or positive findings: She lies in a semi-right lateral decubitus position. She declined to open her eyes throughout the exam. Only after repeated requests did she  allow oral exam. Feeding tube was hooked to bags hanging on IV pole. She has scattered low-grade rhonchi. A gallop type cadence is present.  PEG in place.There is marked wasting of the limbs. She exhibited minimal movement of the extremities, especially the left lower extremity. Pedal pulses are surprisingly good.  General appearance:Adequately nourished; no acute distress , increased work of breathing is present.   Lymphatic: No lymphadenopathy about the head, neck, axilla . Ears:  External ear exam shows no significant lesions or deformities.   Nose:  External nasal examination shows no deformity or inflammation. Nasal mucosa are pink and moist without lesions ,exudates Oral exam: lips and gums are healthy appearing. Neck:  No thyromegaly, masses, tenderness noted.    Heart:  Normal rate and regular rhythm. S1 and S2 normal without murmur, click, rub .  Abdomen:Bowel sounds are normal. Abdomen is soft and nontender with no organomegaly, hernias,masses. GU: deferred  Extremities:  No cyanosis, clubbing,edema  Neurologic exam : Strength could not be tested. Balance,Rhomberg,finger to nose testing could not be completed due to clinical state Deep tendon reflexes are equal but 0-1/2+ Skin: Warm & dry w/o tenting. No significant lesions or rash.    See summary under each active problem in the Problem List with associated updated therapeutic plan

## 2016-03-22 NOTE — Assessment & Plan Note (Signed)
Her most recent A1c was prediabetic. The highest on record is 6.4%. There is no indication for hypoglycemic agents. An  A1c of 8% or less  is the safest goal for her; it is critical to prevent hypoglycemia. Recheck A1c in late January.

## 2016-03-22 NOTE — Telephone Encounter (Signed)
Pt was on TCM list admitted for aspiration pneumonia. Pt was D/C 12/5 to SNF...Raechel Chute/lmb

## 2016-03-26 ENCOUNTER — Encounter: Payer: Self-pay | Admitting: Internal Medicine

## 2016-03-26 DIAGNOSIS — F0390 Unspecified dementia without behavioral disturbance: Secondary | ICD-10-CM | POA: Insufficient documentation

## 2016-03-26 DIAGNOSIS — F339 Major depressive disorder, recurrent, unspecified: Secondary | ICD-10-CM | POA: Insufficient documentation

## 2016-04-24 ENCOUNTER — Other Ambulatory Visit: Payer: Self-pay | Admitting: *Deleted

## 2016-04-24 NOTE — Patient Outreach (Signed)
[facility]) Care Management  04/24/2016  Sheila Cisneros [birth year] [id number]   Met with patient nephew, Elmer Ramp, [place].  He states that patient will be going home from facility and not remain LTC.  He plans to get home modifications completed before she discharges. He states he has had meetings with facility and he knows that patient will be 24/7 care. He states he and other caregivers will be able to give care. He states he cared for his mother for 1 1/2 years when she developed ALS. He is aware of the care needs of patient. He states he would appreciate [facility] program services in addition to any Home care services that will be provided.  He understands he will need a lot of support systems for patient to go home, he is willing to take on the responsibility for patient care.   RNCM discussed [facility] community care management program. He agrees this will be needed service when patient discharges home. RNCM gave housing resource information.  Plan: This RNCM will follow and will have patient or nephew sign consent closer to discharge and refer to  [place] for [facility] and SW. Verbal consent obtained today. Will let SW at facility know that [facility] will be available for patient upon discharge and this RNCM will follow  While at facility.

## 2016-04-26 ENCOUNTER — Other Ambulatory Visit: Payer: Self-pay | Admitting: *Deleted

## 2016-04-26 NOTE — Patient Outreach (Signed)
[facility]  Reviewed case with Enrique SackKendra and reviewed discharge plans. She states that at this time patient is remaining LTC at facility. Nephew is completing Medicaid paperwork. Long term plan is for nephew to get home ready for patient to discharge back home but goal is to wean off tube feedings, get patient more ambulatory before she goes home. Also, other family wants patient to remain at facility due to high risk status, so there are no immediate discharge plans at this time.  Plan:  This RNCM will sign off case as patient will be remaining at facility LTC at this time. Instructed Enrique SackKendra that [facility] services are available to patient when or if she discharges from the facility and this RNCM can be consulted again to review case, she agrees.   Alben SpittleMary E. Albertha GheeNiemczura, RN, BSN, CCM  [facility] Post Acute Care Coordinator [phone]

## 2016-05-18 ENCOUNTER — Non-Acute Institutional Stay (SKILLED_NURSING_FACILITY): Payer: Medicare Other | Admitting: Internal Medicine

## 2016-05-18 ENCOUNTER — Encounter: Payer: Self-pay | Admitting: Internal Medicine

## 2016-05-18 DIAGNOSIS — E118 Type 2 diabetes mellitus with unspecified complications: Secondary | ICD-10-CM | POA: Diagnosis not present

## 2016-05-18 DIAGNOSIS — F0150 Vascular dementia without behavioral disturbance: Secondary | ICD-10-CM

## 2016-05-18 NOTE — Assessment & Plan Note (Signed)
Psychiatry consultation concerning competency pending;this will be reviewed

## 2016-05-18 NOTE — Progress Notes (Signed)
   This is a nursing facility follow up for specific issue of hyperglycemia and follow up of competency issues.  Interim medical record and care [facility] was updated with review of diagnostic studies and change in clinical status since last visit were documented.  HPI: Glucoses have intermittently been very high, ranging 120-393. Glucoses are done in the morning but the patient is on Jevity tube feeding @ 55 cc /hr During her previous hospitalization she had profound hypoglycemia with a value 40. At that time she had been NPO waiting tube placement. The last A1c was 6.4% on 03/13/16  Psychiatry was asked to evaluate the patient as to competency by her nephew who is the healthcare power of attorney. That final evaluation apparently has been completed and the report will be requested. She has had almost comatose state in the setting of severe hypernatremia ;sodium was 164 on 03/05/16.  Review of systems: Could not be completed as there is no verbalization or ability to follow commands. She does open eyes when addressed.  Physical exam:  Pertinent or positive findings: The patient will open her eyes slightly but follows no other commands. Torticollis of the neck to the left is suggested. PEG tube is in place. Breath sounds are distant. S4 gallop type cadence was present. Pedal pulses are surprisingly good.She does not move extremities. The upper extremities are in mittens. There is some atrophy of the extremities. Flexion contractures of the toes are present.   General appearance:Adequately nourished; no acute distress , increased work of breathing is present.   Lymphatic: No lymphadenopathy about the head, neck, axilla . Eyes: Limited exam but no conjunctival inflammation or lid edema is present. There is no scleral icterus. Ears:  External ear exam shows no significant lesions or deformities.   Nose:  External nasal examination shows no deformity or  inflammation. Nasal mucosa are pink and moist without lesions ,exudates Oral exam: lips and gums are healthy appearing. Neck:  No thyromegaly, masses, tenderness noted.    Heart:  No murmur, click, rub .  Lungs:Chest clear to auscultation anteriorly without wheezes, rhonchi,rales , rubs. Abdomen:Bowel sounds are normal. Abdomen is soft and nontender with no organomegaly, hernias,masses. GU: deferred  Extremities:  No cyanosis, clubbing,edema  Skin: Warm & dry w/o tenting. No significant lesions or rash.  Assessment/plan: #1 hyperglycemia with prediabetic A1c. She is on no diabetic medications. She is on continuous tube feedings. Basal insulin will be initiated and Nutrition consulted as to optimal tube feeding to prevent progressive hyperglycemia.  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 Clinically there is no question as to her being incompetent. Formal report from psychiatry evaluation is pending.

## 2016-05-18 NOTE — Patient Instructions (Signed)
See assessment and plan under each diagnosis for this visit  

## 2016-05-18 NOTE — Assessment & Plan Note (Signed)
05/18/16 low-dose basal insulin, Lantus 10 units at bedtime Nutrition consult to optimal tube feeding supplement, Jevity versus Glucerna, etc.

## 2016-05-27 ENCOUNTER — Emergency Department (HOSPITAL_COMMUNITY): Payer: Medicare Other

## 2016-05-27 ENCOUNTER — Inpatient Hospital Stay (HOSPITAL_COMMUNITY)
Admission: EM | Admit: 2016-05-27 | Discharge: 2016-06-01 | DRG: 682 | Disposition: A | Discharge status: Skilled Nursing Facility | Payer: Medicare Other | Attending: Internal Medicine | Admitting: Internal Medicine

## 2016-05-27 ENCOUNTER — Encounter (HOSPITAL_COMMUNITY): Payer: Self-pay | Admitting: *Deleted

## 2016-05-27 DIAGNOSIS — E876 Hypokalemia: Secondary | ICD-10-CM | POA: Diagnosis present

## 2016-05-27 DIAGNOSIS — E870 Hyperosmolality and hypernatremia: Secondary | ICD-10-CM | POA: Diagnosis not present

## 2016-05-27 DIAGNOSIS — I10 Essential (primary) hypertension: Secondary | ICD-10-CM | POA: Diagnosis present

## 2016-05-27 DIAGNOSIS — K219 Gastro-esophageal reflux disease without esophagitis: Secondary | ICD-10-CM | POA: Diagnosis present

## 2016-05-27 DIAGNOSIS — Z794 Long term (current) use of insulin: Secondary | ICD-10-CM

## 2016-05-27 DIAGNOSIS — E1151 Type 2 diabetes mellitus with diabetic peripheral angiopathy without gangrene: Secondary | ICD-10-CM | POA: Diagnosis present

## 2016-05-27 DIAGNOSIS — G8929 Other chronic pain: Secondary | ICD-10-CM | POA: Diagnosis present

## 2016-05-27 DIAGNOSIS — R609 Edema, unspecified: Secondary | ICD-10-CM | POA: Diagnosis not present

## 2016-05-27 DIAGNOSIS — Z515 Encounter for palliative care: Secondary | ICD-10-CM | POA: Diagnosis present

## 2016-05-27 DIAGNOSIS — J440 Chronic obstructive pulmonary disease with acute lower respiratory infection: Secondary | ICD-10-CM | POA: Diagnosis present

## 2016-05-27 DIAGNOSIS — N179 Acute kidney failure, unspecified: Secondary | ICD-10-CM | POA: Diagnosis present

## 2016-05-27 DIAGNOSIS — J189 Pneumonia, unspecified organism: Secondary | ICD-10-CM | POA: Diagnosis present

## 2016-05-27 DIAGNOSIS — E861 Hypovolemia: Secondary | ICD-10-CM | POA: Diagnosis present

## 2016-05-27 DIAGNOSIS — Z79899 Other long term (current) drug therapy: Secondary | ICD-10-CM

## 2016-05-27 DIAGNOSIS — M545 Low back pain: Secondary | ICD-10-CM | POA: Diagnosis present

## 2016-05-27 DIAGNOSIS — E785 Hyperlipidemia, unspecified: Secondary | ICD-10-CM | POA: Diagnosis present

## 2016-05-27 DIAGNOSIS — G9340 Encephalopathy, unspecified: Secondary | ICD-10-CM | POA: Diagnosis present

## 2016-05-27 DIAGNOSIS — Y95 Nosocomial condition: Secondary | ICD-10-CM | POA: Diagnosis present

## 2016-05-27 DIAGNOSIS — M1990 Unspecified osteoarthritis, unspecified site: Secondary | ICD-10-CM | POA: Diagnosis present

## 2016-05-27 DIAGNOSIS — I5032 Chronic diastolic (congestive) heart failure: Secondary | ICD-10-CM | POA: Diagnosis present

## 2016-05-27 DIAGNOSIS — Z8673 Personal history of transient ischemic attack (TIA), and cerebral infarction without residual deficits: Secondary | ICD-10-CM | POA: Diagnosis not present

## 2016-05-27 DIAGNOSIS — Z7982 Long term (current) use of aspirin: Secondary | ICD-10-CM

## 2016-05-27 DIAGNOSIS — E8809 Other disorders of plasma-protein metabolism, not elsewhere classified: Secondary | ICD-10-CM | POA: Diagnosis present

## 2016-05-27 DIAGNOSIS — I110 Hypertensive heart disease with heart failure: Secondary | ICD-10-CM | POA: Diagnosis present

## 2016-05-27 DIAGNOSIS — M109 Gout, unspecified: Secondary | ICD-10-CM | POA: Diagnosis present

## 2016-05-27 DIAGNOSIS — F0390 Unspecified dementia without behavioral disturbance: Secondary | ICD-10-CM | POA: Diagnosis present

## 2016-05-27 DIAGNOSIS — E1165 Type 2 diabetes mellitus with hyperglycemia: Secondary | ICD-10-CM | POA: Diagnosis present

## 2016-05-27 DIAGNOSIS — R945 Abnormal results of liver function studies: Secondary | ICD-10-CM | POA: Diagnosis present

## 2016-05-27 DIAGNOSIS — E860 Dehydration: Secondary | ICD-10-CM | POA: Diagnosis present

## 2016-05-27 DIAGNOSIS — J449 Chronic obstructive pulmonary disease, unspecified: Secondary | ICD-10-CM | POA: Diagnosis present

## 2016-05-27 DIAGNOSIS — L89152 Pressure ulcer of sacral region, stage 2: Secondary | ICD-10-CM | POA: Diagnosis present

## 2016-05-27 DIAGNOSIS — Z880 Allergy status to penicillin: Secondary | ICD-10-CM | POA: Diagnosis not present

## 2016-05-27 DIAGNOSIS — E118 Type 2 diabetes mellitus with unspecified complications: Secondary | ICD-10-CM | POA: Diagnosis present

## 2016-05-27 DIAGNOSIS — Z7189 Other specified counseling: Secondary | ICD-10-CM | POA: Diagnosis not present

## 2016-05-27 LAB — CBC WITH DIFFERENTIAL/PLATELET
Basophils Absolute: 0 10*3/uL (ref 0.0–0.1)
Basophils Relative: 0 %
EOS PCT: 1 %
Eosinophils Absolute: 0.1 10*3/uL (ref 0.0–0.7)
HEMATOCRIT: 41.5 % (ref 36.0–46.0)
Hemoglobin: 12.2 g/dL (ref 12.0–15.0)
LYMPHS ABS: 3.1 10*3/uL (ref 0.7–4.0)
Lymphocytes Relative: 24 %
MCH: 28.7 pg (ref 26.0–34.0)
MCHC: 29.4 g/dL — AB (ref 30.0–36.0)
MCV: 97.6 fL (ref 78.0–100.0)
MONOS PCT: 3 %
Monocytes Absolute: 0.4 10*3/uL (ref 0.1–1.0)
NEUTROS ABS: 9.2 10*3/uL — AB (ref 1.7–7.7)
Neutrophils Relative %: 72 %
Platelets: 169 10*3/uL (ref 150–400)
RBC: 4.25 MIL/uL (ref 3.87–5.11)
RDW: 18.1 % — AB (ref 11.5–15.5)
WBC: 12.8 10*3/uL — ABNORMAL HIGH (ref 4.0–10.5)

## 2016-05-27 LAB — URINALYSIS, ROUTINE W REFLEX MICROSCOPIC
BACTERIA UA: NONE SEEN
Bilirubin Urine: NEGATIVE
Glucose, UA: >=500 mg/dL — AB
Hgb urine dipstick: NEGATIVE
KETONES UR: NEGATIVE mg/dL
Leukocytes, UA: NEGATIVE
Nitrite: NEGATIVE
PH: 8 (ref 5.0–8.0)
Protein, ur: NEGATIVE mg/dL
SPECIFIC GRAVITY, URINE: 1.022 (ref 1.005–1.030)
SQUAMOUS EPITHELIAL / LPF: NONE SEEN

## 2016-05-27 LAB — COMPREHENSIVE METABOLIC PANEL
ALT: 69 U/L — AB (ref 14–54)
AST: 48 U/L — ABNORMAL HIGH (ref 15–41)
Albumin: 2.6 g/dL — ABNORMAL LOW (ref 3.5–5.0)
Alkaline Phosphatase: 142 U/L — ABNORMAL HIGH (ref 38–126)
Anion gap: 11 (ref 5–15)
BUN: 47 mg/dL — ABNORMAL HIGH (ref 6–20)
CHLORIDE: 120 mmol/L — AB (ref 101–111)
CO2: 28 mmol/L (ref 22–32)
CREATININE: 1.39 mg/dL — AB (ref 0.44–1.00)
Calcium: 12.5 mg/dL — ABNORMAL HIGH (ref 8.9–10.3)
GFR calc non Af Amer: 36 mL/min — ABNORMAL LOW (ref >60)
GFR, EST AFRICAN AMERICAN: 42 mL/min — AB (ref >60)
Glucose, Bld: 366 mg/dL — ABNORMAL HIGH (ref 65–99)
POTASSIUM: 3.4 mmol/L — AB (ref 3.5–5.1)
Sodium: 159 mmol/L — ABNORMAL HIGH (ref 135–145)
Total Bilirubin: 0.4 mg/dL (ref 0.3–1.2)
Total Protein: 7.9 g/dL (ref 6.5–8.1)

## 2016-05-27 LAB — I-STAT CHEM 8, ED
BUN: 49 mg/dL — AB (ref 6–20)
BUN: 51 mg/dL — AB (ref 6–20)
CHLORIDE: 121 mmol/L — AB (ref 101–111)
Calcium, Ion: 1.6 mmol/L (ref 1.15–1.40)
Calcium, Ion: 1.62 mmol/L (ref 1.15–1.40)
Chloride: 120 mmol/L — ABNORMAL HIGH (ref 101–111)
Creatinine, Ser: 1.4 mg/dL — ABNORMAL HIGH (ref 0.44–1.00)
Creatinine, Ser: 1.4 mg/dL — ABNORMAL HIGH (ref 0.44–1.00)
Glucose, Bld: 317 mg/dL — ABNORMAL HIGH (ref 65–99)
Glucose, Bld: 378 mg/dL — ABNORMAL HIGH (ref 65–99)
HEMATOCRIT: 32 % — AB (ref 36.0–46.0)
HEMATOCRIT: 40 % (ref 36.0–46.0)
Hemoglobin: 10.9 g/dL — ABNORMAL LOW (ref 12.0–15.0)
Hemoglobin: 13.6 g/dL (ref 12.0–15.0)
Potassium: 3.2 mmol/L — ABNORMAL LOW (ref 3.5–5.1)
Potassium: 3.3 mmol/L — ABNORMAL LOW (ref 3.5–5.1)
SODIUM: 165 mmol/L — AB (ref 135–145)
Sodium: 164 mmol/L (ref 135–145)
TCO2: 33 mmol/L (ref 0–100)
TCO2: 33 mmol/L (ref 0–100)

## 2016-05-27 LAB — I-STAT TROPONIN, ED: TROPONIN I, POC: 0.01 ng/mL (ref 0.00–0.08)

## 2016-05-27 LAB — I-STAT CG4 LACTIC ACID, ED: Lactic Acid, Venous: 2.62 mmol/L (ref 0.5–1.9)

## 2016-05-27 MED ORDER — INSULIN ASPART 100 UNIT/ML ~~LOC~~ SOLN
5.0000 [IU] | Freq: Once | SUBCUTANEOUS | Status: AC
  Administered 2016-05-28: 5 [IU] via INTRAVENOUS
  Filled 2016-05-27: qty 1

## 2016-05-27 MED ORDER — PIPERACILLIN-TAZOBACTAM 3.375 G IVPB 30 MIN
3.3750 g | Freq: Once | INTRAVENOUS | Status: AC
  Administered 2016-05-27: 3.375 g via INTRAVENOUS
  Filled 2016-05-27: qty 50

## 2016-05-27 MED ORDER — VANCOMYCIN HCL 10 G IV SOLR: 20.0000 mg/kg | Freq: Once | INTRAVENOUS | Status: DC

## 2016-05-27 MED ORDER — SODIUM CHLORIDE 0.9 % IV BOLUS (SEPSIS)
500.0000 mL | Freq: Once | INTRAVENOUS | Status: AC
  Administered 2016-05-27: 500 mL via INTRAVENOUS

## 2016-05-27 MED ORDER — VANCOMYCIN HCL IN DEXTROSE 1-5 GM/200ML-% IV SOLN
1000.0000 mg | Freq: Once | INTRAVENOUS | Status: AC
  Administered 2016-05-27: 1000 mg via INTRAVENOUS
  Filled 2016-05-27: qty 200

## 2016-05-27 MED ORDER — SODIUM CHLORIDE 0.45 % IV SOLN
INTRAVENOUS | Status: DC
  Administered 2016-05-28 – 2016-05-30 (×7): via INTRAVENOUS

## 2016-05-27 MED ORDER — SODIUM CHLORIDE 0.45 % IV BOLUS
1000.0000 mL | Freq: Once | INTRAVENOUS | Status: AC
  Administered 2016-05-28: 1000 mL via INTRAVENOUS

## 2016-05-27 NOTE — ED Triage Notes (Addendum)
[facility] originally called out for hyperglycemia. Pt at baseline, can open her eyes, follows no other commands. Nursing facility gave 8 units of insulin at 2050. EMS cbg 475. Pt had blood work yesterday: sodium 159, bun 49. Recent diagnoses of pneumonia. Pt noted to be drooling, does have gag reflux, contractures noted

## 2016-05-27 NOTE — ED Provider Notes (Signed)
[facility]-EM[facility]ENCY DEPT Provider Note   CSN: [id number] Arrival date & time: 05/27/16  2132     History   Chief Complaint Chief Complaint  Patient presents with  . Code Sepsis    HPI Sheila Cisneros is a 76 y.o. female.  HPI Presents from facility due to new onset of hypernatremia was concern for sepsis. Patient has a history of MRSA pneumonia, aspiration pneumonia, hypernatremia in November that resolved, COPD, advanced dementia. History obtained from EMS who states that patient is at her mental status baseline. However, she had labs done today and they revealed significant abnormalities as noted above. Patient is nonverbal on arrival and history limited secondary to this. Per EMS, patient was HDS in route with no acute events.  Past Medical History:  Diagnosis Date  . Altered mental status 12/09/2012  . Arthritis    "all over" (12/09/2012)  . Chronic lower back pain   . COPD (chronic obstructive pulmonary disease) (HCC)   . Diabetes mellitus without complication (HCC)   . Exertional shortness of breath   . GERD (gastroesophageal reflux disease)    rolaids if needed  . Gout, unspecified   . Hepatitis, unspecified    "the one that's not bad" (12/09/2012)  . History of blood transfusion    "related to a surgery, I think" (12/09/2012)  . Hyperlipidemia   . Hypertension   . Migraines    "in the past" (12/09/2012)  . Osteoarthrosis, unspecified whether generalized or localized, unspecified site   . Other chronic sinusitis   . Pneumonia    "couple times; long time ago" (12/09/2012)  . Unspecified chronic bronchitis    sees Dr. Melba Coon. Young, last visit 08/2011, treated for E. Coli- resp. /w Ceftin, Feb. 2013    Patient Active Problem List   Diagnosis Date Noted  . Hypernatremia 05/27/2016  . Depression, major, recurrent (HCC) 03/26/2016  . Dementia without behavioral disturbance 03/26/2016  . Palliative care encounter   . Goals of care, counseling/discussion   . Acute on chronic  respiratory failure with hypoxia (HCC)   . Pneumonia of both lower lobes due to methicillin resistant Staphylococcus aureus (MRSA) (HCC)   . Panlobular emphysema (HCC)   . Acute pulmonary edema (HCC)   . Chronic diastolic CHF (congestive heart failure) (HCC)   . Acute kidney injury (HCC)   . Oropharyngeal dysphagia   . Aspiration pneumonia (HCC) 03/05/2016  . Pressure injury of skin 03/05/2016  . AKI (acute kidney injury) (HCC)   . Torticollis 02/28/2016  . Encephalopathy, metabolic 02/07/2016  . HCAP (healthcare-associated pneumonia) 02/05/2016  . Bronchiectasis (HCC) 06/18/2015  . Insomnia due to anxiety and fear 06/09/2015  . Hearing loss 05/02/2015  . Mass of lower lobe of left lung 04/06/2015  . COPD (chronic obstructive pulmonary disease) (HCC) 03/23/2015  . Other screening mammogram 08/04/2013  . Duodenal ulcer 08/03/2013  . Hypokalemia 08/03/2013  . Hyperlipidemia with target LDL less than 100   . Type II diabetes mellitus with manifestations (HCC) 06/21/2013  . Scoliosis (and kyphoscoliosis), idiopathic 12/12/2012  . Vascular dementia 12/10/2012  . GERD (gastroesophageal reflux disease) 12/09/2012  . GOUT 03/07/2007  . Essential hypertension 03/07/2007    Past Surgical History:  Procedure Laterality Date  . ABDOMINAL HYSTERECTOMY     "w/right ovary" (12/09/2012)  . APPENDECTOMY    . BREAST BIOPSY Right   . CARPAL TUNNEL RELEASE  09/06/2011   Procedure: CARPAL TUNNEL RELEASE;  Surgeon: Kennieth RadArthur F Carter, MD;  [facility];  Service: Orthopedics;  Laterality: Right;  . ESOPHAGOGASTRODUODENOSCOPY N/A 06/04/2013   Procedure: ESOPHAGOGASTRODUODENOSCOPY (EGD);  Surgeon: Graylin Shiver, MD;  Location: [facility] ENDOSCOPY;  Service: Endoscopy;  Laterality: N/A;  . IR GENERIC HISTORICAL  03/19/2016   IR GASTROSTOMY TUBE MOD SED 03/19/2016 Oley Balm, MD [facility]-INTER[facility]  . SHOULDER ARTHROSCOPY W/ ROTATOR CUFF REPAIR Right   . TONSILLECTOMY    . TUBAL LIGATION      OB History     No data available       Home Medications    Prior to Admission medications   Medication Sig Start Date End Date Taking? Authorizing Provider  acetaminophen (TYLENOL) 500 MG tablet Place 1 tablet (500 mg total) into feeding tube every 6 (six) hours as needed. For pain 03/20/16  Yes Osvaldo Shipper, MD  Ascorbic Acid (VITAMIN C) 500 MG tablet Take 500 mg by mouth daily. Per tube   Yes Historical Provider, MD  aspirin 81 MG chewable tablet Chew 81 mg by mouth daily. Per tube   Yes Historical Provider, MD  bisacodyl (DULCOLAX) 10 MG suppository Place 10 mg rectally as needed for moderate constipation.   Yes Historical Provider, MD  cholecalciferol (VITAMIN D) 1000 UNITS tablet Take 1,000 Units by mouth daily. Per tube   Yes Historical Provider, MD  insulin aspart (NOVOLOG) 100 UNIT/ML injection Inject 10 Units into the skin once.   Yes Historical Provider, MD  insulin glargine (LANTUS) 100 UNIT/ML injection Inject 10 Units into the skin at bedtime.   Yes Historical Provider, MD  ipratropium-albuterol (DUONEB) 0.5-2.5 (3) MG/3ML SOLN Take 3 mLs by nebulization every 6 (six) hours as needed (for shortness of breath).   Yes Historical Provider, MD  magnesium hydroxide (MILK OF MAGNESIA) 400 MG/5ML suspension Take 30 mLs by mouth daily as needed for mild constipation.   Yes Historical Provider, MD  Multiple Vitamin (TAB-A-VITE) TABS Place 1 tablet into feeding tube daily.   Yes Historical Provider, MD  Nutritional Supplements (FIBERSOURCE PO) 55 mL/hr by PEG Tube route continuous. Fibersource 1.2 cal   Yes Historical Provider, MD  Sodium Phosphates (RA SALINE ENEMA) 19-7 GM/118ML ENEM Place 1 each rectally as needed (for constipation).   Yes Historical Provider, MD  spironolactone (ALDACTONE) 25 MG tablet Place 25 mg into feeding tube 2 (two) times daily.   Yes Historical Provider, MD  Tiotropium Bromide-Olodaterol (STIOLTO RESPIMAT) 2.5-2.5 MCG/ACT AERS Inhale 2 puffs into the lungs daily. 10/17/15  Yes  Etta Grandchild, MD  venlafaxine (EFFEXOR) 37.5 MG tablet Place 1 tablet (37.5 mg total) into feeding tube See admin instructions. Takes 1 capsule daily, then can take 1 addt'l cap as needed for hot flashes Patient taking differently: Place 37.5 mg into feeding tube daily.  03/20/16  Yes Osvaldo Shipper, MD  venlafaxine (EFFEXOR) 37.5 MG tablet Place 37.5 mg into feeding tube as needed (for hot flashes).   Yes Historical Provider, MD  verapamil (CALAN) 40 MG tablet Place 40 mg into feeding tube every 8 (eight) hours.   Yes Historical Provider, MD  aspirin EC 81 MG tablet Take 1 tablet (81 mg total) by mouth daily. Patient not taking: Reported on 05/27/2016 03/20/16   Osvaldo Shipper, MD  Multiple Vitamin (MULTIVITAMIN) capsule Place 1 capsule into feeding tube daily. Patient not taking: Reported on 05/27/2016 03/20/16   Osvaldo Shipper, MD  Nutritional Supplements (FEEDING SUPPLEMENT, JEVITY 1.2 CAL,) LIQD Place 1,000 mLs into feeding tube continuous. Patient not taking: Reported on 05/27/2016 03/20/16   Osvaldo Shipper, MD  simvastatin (ZOCOR) 20  MG tablet Place 1 tablet (20 mg total) into feeding tube at bedtime. Patient not taking: Reported on 05/27/2016 03/20/16   Osvaldo Shipper, MD  verapamil (COVERA HS) 240 MG (CO) 24 hr tablet 240mg  daily at bedtime via Tube. Patient not taking: Reported on 05/27/2016 03/20/16   Osvaldo Shipper, MD    Family History Family History  Problem Relation Age of Onset  . Heart disease Father   . ALS Brother     Social History Social History  Substance Use Topics  . Smoking status: Never Smoker  . Smokeless tobacco: Never Used  . Alcohol use No     Comment: 12/09/2012 "have a taste yearly"     Allergies   Codeine; Neomycin; Penicillins; Tetanus toxoids; Diphtheria toxoid-containing vaccines; and Levofloxacin   Review of Systems Review of Systems  Unable to perform ROS: Patient nonverbal     Physical Exam Updated Vital Signs BP 112/70   Pulse 101   Temp  101.2 F (38.4 C) (Rectal)   Resp 26   Wt 49.9 kg   SpO2 100%   BMI 20.12 kg/m   Physical Exam  Constitutional: She appears well-developed and well-nourished. No distress.  HENT:  Head: Normocephalic and atraumatic.  Eyes: Conjunctivae are normal.  Neck: Neck supple.  Cardiovascular: Regular rhythm.   No murmur heard. Slight tachycardia  Pulmonary/Chest: Effort normal and breath sounds normal. No respiratory distress.  Abdominal: Soft. There is no tenderness.  Musculoskeletal: She exhibits no edema.  Neurological:  Lethargic, does not open eyes  Skin: Skin is warm and dry.  Psychiatric:  N/A  Nursing note and vitals reviewed.    ED Treatments / Results  Labs (all labs ordered are listed, but only abnormal results are displayed) Labs Reviewed  CBC WITH DIFFERENTIAL/PLATELET - Abnormal; Notable for the following:       Result Value   WBC 12.8 (*)    MCHC 29.4 (*)    RDW 18.1 (*)    Neutro Abs 9.2 (*)    All other components within normal limits  URINALYSIS, ROUTINE W REFLEX MICROSCOPIC - Abnormal; Notable for the following:    APPearance HAZY (*)    Glucose, UA >=500 (*)    All other components within normal limits  COMPREHENSIVE METABOLIC PANEL - Abnormal; Notable for the following:    Sodium 159 (*)    Potassium 3.4 (*)    Chloride 120 (*)    Glucose, Bld 366 (*)    BUN 47 (*)    Creatinine, Ser 1.39 (*)    Calcium 12.5 (*)    Albumin 2.6 (*)    AST 48 (*)    ALT 69 (*)    Alkaline Phosphatase 142 (*)    GFR calc non Af Amer 36 (*)    GFR calc Af Amer 42 (*)    All other components within normal limits  I-STAT CG4 LACTIC ACID, ED - Abnormal; Notable for the following:    Lactic Acid, Venous 2.62 (*)    All other components within normal limits  I-STAT CHEM 8, ED - Abnormal; Notable for the following:    Sodium 164 (*)    Potassium 3.2 (*)    Chloride 121 (*)    BUN 51 (*)    Creatinine, Ser 1.40 (*)    Glucose, Bld 378 (*)    Calcium, Ion 1.60 (*)     All other components within normal limits  I-STAT CHEM 8, ED - Abnormal; Notable for the following:  Sodium 165 (*)    Potassium 3.3 (*)    Chloride 120 (*)    BUN 49 (*)    Creatinine, Ser 1.40 (*)    Glucose, Bld 317 (*)    Calcium, Ion 1.62 (*)    Hemoglobin 10.9 (*)    HCT 32.0 (*)    All other components within normal limits  I-STAT CG4 LACTIC ACID, ED - Abnormal; Notable for the following:    Lactic Acid, Venous 2.32 (*)    All other components within normal limits  CULTURE, BLOOD (ROUTINE X 2)  CULTURE, BLOOD (ROUTINE X 2)  I-STAT TROPOININ, ED    EKG  EKG Interpretation  Date/Time:  Sunday May 27 2016 21:40:56 EST Ventricular Rate:  114 PR Interval:    QRS Duration: 70 QT Interval:  317 QTC Calculation: 437 R Axis:   16 Text Interpretation:  Sinus tachycardia Consider left ventricular hypertrophy Nonspecific T abnormalities, lateral leads SINCE LAST TRACING HEART RATE HAS INCREASED Confirmed by BELFI  MD, MELANIE ([id number]) on 05/27/2016 11:16:07 PM       Radiology Dg Chest Portable 1 View  Result Date: 05/27/2016 CLINICAL DATA:  Altered mental status EXAM: PORTABLE CHEST 1 VIEW COMPARISON:  03/10/2016 FINDINGS: Cardiac shadow is within normal limits. Previously seen endotracheal tube and nasogastric catheter have been removed. Patchy left basilar infiltrate is seen. The right lung is clear. IMPRESSION: Patchy left basilar infiltrate. Followup PA and lateral chest X-ray is recommended in 3-4 weeks following trial of antibiotic therapy to ensure resolution and exclude underlying malignancy. Electronically Signed   By: Alcide Clever M.D.   On: 05/27/2016 22:23    Procedures Procedures (including critical care time)  Medications Ordered in ED Medications  0.45 % sodium chloride infusion (not administered)  sodium chloride 0.45 % bolus 1,000 mL (1,000 mLs Intravenous New Bag/Given 05/28/16 0050)  ceFEPIme (MAXIPIME) 1 g in dextrose 5 % 50 mL IVPB (not  administered)  vancomycin (VANCOCIN) 500 mg in sodium chloride 0.9 % 100 mL IVPB (not administered)  sodium chloride 0.9 % bolus 500 mL (0 mLs Intravenous Stopped 05/27/16 2252)  piperacillin-tazobactam (ZOSYN) IVPB 3.375 g (0 g Intravenous Stopped 05/27/16 2320)  vancomycin (VANCOCIN) IVPB 1000 mg/200 mL premix (0 mg Intravenous Stopped 05/28/16 0004)  insulin aspart (novoLOG) injection 5 Units (5 Units Intravenous Given 05/28/16 0045)     Initial Impression / Assessment and Plan / ED Course  I have reviewed the triage vital signs and the nursing notes.  Pertinent labs & imaging results that were available during my care of the patient were reviewed by me and considered in my medical decision making (see chart for details).     Patient has new-onset of hypernatremia, pneumonia. We'll cover for healthcare acquired pneumonia given recent pneumonia and broad-spectrum antibiotics. Mental status is decreased and patient is full code. However, patient at baseline is minimally responsive per nephew who later arrives.  He is power of attorney and states the patient is still full code. Patient given fluids and close monitoring of sodium. Therefore only 500 mL of fluids given initially. Fluids gtt ordered, will admit.   Final Clinical Impressions(s) / ED Diagnoses   Final diagnoses:  Hypernatremia  HCAP (healthcare-associated pneumonia)    New Prescriptions New Prescriptions   No medications on file     Sidney Ace, MD 05/28/16 0132    Rolan Bucco, MD 05/28/16 [phone]

## 2016-05-28 LAB — CBC WITH DIFFERENTIAL/PLATELET
BASOS ABS: 0 10*3/uL (ref 0.0–0.1)
BASOS PCT: 0 %
EOS PCT: 1 %
Eosinophils Absolute: 0.2 10*3/uL (ref 0.0–0.7)
HCT: 36.9 % (ref 36.0–46.0)
HEMOGLOBIN: 10.9 g/dL — AB (ref 12.0–15.0)
Lymphocytes Relative: 25 %
Lymphs Abs: 3.4 10*3/uL (ref 0.7–4.0)
MCH: 28.6 pg (ref 26.0–34.0)
MCHC: 29.5 g/dL — ABNORMAL LOW (ref 30.0–36.0)
MCV: 96.9 fL (ref 78.0–100.0)
Monocytes Absolute: 0.6 10*3/uL (ref 0.1–1.0)
Monocytes Relative: 5 %
NEUTROS PCT: 69 %
Neutro Abs: 9.4 10*3/uL — ABNORMAL HIGH (ref 1.7–7.7)
PLATELETS: 137 10*3/uL — AB (ref 150–400)
RBC: 3.81 MIL/uL — AB (ref 3.87–5.11)
RDW: 17.7 % — ABNORMAL HIGH (ref 11.5–15.5)
WBC: 13.6 10*3/uL — ABNORMAL HIGH (ref 4.0–10.5)

## 2016-05-28 LAB — COMPREHENSIVE METABOLIC PANEL
ALBUMIN: 2.3 g/dL — AB (ref 3.5–5.0)
ALK PHOS: 114 U/L (ref 38–126)
ALT: 54 U/L (ref 14–54)
AST: 43 U/L — ABNORMAL HIGH (ref 15–41)
Anion gap: 7 (ref 5–15)
BUN: 41 mg/dL — ABNORMAL HIGH (ref 6–20)
CALCIUM: 11.1 mg/dL — AB (ref 8.9–10.3)
CHLORIDE: 120 mmol/L — AB (ref 101–111)
CO2: 30 mmol/L (ref 22–32)
CREATININE: 1.3 mg/dL — AB (ref 0.44–1.00)
GFR calc non Af Amer: 39 mL/min — ABNORMAL LOW (ref >60)
GFR, EST AFRICAN AMERICAN: 45 mL/min — AB (ref >60)
GLUCOSE: 150 mg/dL — AB (ref 65–99)
Potassium: 3.4 mmol/L — ABNORMAL LOW (ref 3.5–5.1)
Sodium: 157 mmol/L — ABNORMAL HIGH (ref 135–145)
Total Bilirubin: 1.1 mg/dL (ref 0.3–1.2)
Total Protein: 6.9 g/dL (ref 6.5–8.1)

## 2016-05-28 LAB — CBG MONITORING, ED: GLUCOSE-CAPILLARY: 272 mg/dL — AB (ref 65–99)

## 2016-05-28 LAB — BASIC METABOLIC PANEL
ANION GAP: 6 (ref 5–15)
BUN: 41 mg/dL — ABNORMAL HIGH (ref 6–20)
CHLORIDE: 122 mmol/L — AB (ref 101–111)
CO2: 28 mmol/L (ref 22–32)
Calcium: 11.2 mg/dL — ABNORMAL HIGH (ref 8.9–10.3)
Creatinine, Ser: 1.23 mg/dL — ABNORMAL HIGH (ref 0.44–1.00)
GFR calc Af Amer: 48 mL/min — ABNORMAL LOW (ref >60)
GFR calc non Af Amer: 42 mL/min — ABNORMAL LOW (ref >60)
GLUCOSE: 189 mg/dL — AB (ref 65–99)
Potassium: 3.9 mmol/L (ref 3.5–5.1)
Sodium: 156 mmol/L — ABNORMAL HIGH (ref 135–145)

## 2016-05-28 LAB — GLUCOSE, CAPILLARY
GLUCOSE-CAPILLARY: 170 mg/dL — AB (ref 65–99)
GLUCOSE-CAPILLARY: 190 mg/dL — AB (ref 65–99)

## 2016-05-28 LAB — PROTIME-INR
INR: 1.22
PROTHROMBIN TIME: 15.5 s — AB (ref 11.4–15.2)

## 2016-05-28 LAB — OSMOLALITY, URINE: Osmolality, Ur: 586 mOsm/kg (ref 300–900)

## 2016-05-28 LAB — NA AND K (SODIUM & POTASSIUM), RAND UR
POTASSIUM UR: 51 mmol/L
Sodium, Ur: 35 mmol/L

## 2016-05-28 LAB — I-STAT CG4 LACTIC ACID, ED: LACTIC ACID, VENOUS: 2.32 mmol/L — AB (ref 0.5–1.9)

## 2016-05-28 LAB — STREP PNEUMONIAE URINARY ANTIGEN: STREP PNEUMO URINARY ANTIGEN: NEGATIVE

## 2016-05-28 LAB — MRSA PCR SCREENING: MRSA by PCR: POSITIVE — AB

## 2016-05-28 LAB — APTT: APTT: 25 s (ref 24–36)

## 2016-05-28 MED ORDER — SODIUM CHLORIDE 0.9% FLUSH
3.0000 mL | Freq: Two times a day (BID) | INTRAVENOUS | Status: DC
  Administered 2016-05-28 (×2): 3 mL via INTRAVENOUS
  Administered 2016-05-29: 10 mL via INTRAVENOUS
  Administered 2016-05-29 – 2016-06-01 (×5): 3 mL via INTRAVENOUS

## 2016-05-28 MED ORDER — VANCOMYCIN HCL 500 MG IV SOLR
500.0000 mg | INTRAVENOUS | Status: DC
  Administered 2016-05-28: 500 mg via INTRAVENOUS
  Filled 2016-05-28: qty 500

## 2016-05-28 MED ORDER — INSULIN GLARGINE 100 UNIT/ML ~~LOC~~ SOLN
10.0000 [IU] | Freq: Every day | SUBCUTANEOUS | Status: DC
  Administered 2016-05-28 – 2016-05-31 (×4): 10 [IU] via SUBCUTANEOUS
  Filled 2016-05-28 (×5): qty 0.1

## 2016-05-28 MED ORDER — ORAL CARE MOUTH RINSE
15.0000 mL | Freq: Two times a day (BID) | OROMUCOSAL | Status: DC
  Administered 2016-05-28 – 2016-06-01 (×8): 15 mL via OROMUCOSAL

## 2016-05-28 MED ORDER — CHLORHEXIDINE GLUCONATE 0.12 % MT SOLN
OROMUCOSAL | Status: AC
  Administered 2016-05-28: 15 mL via OROMUCOSAL
  Filled 2016-05-28: qty 15

## 2016-05-28 MED ORDER — CHLORHEXIDINE GLUCONATE CLOTH 2 % EX PADS
6.0000 | MEDICATED_PAD | Freq: Every day | CUTANEOUS | Status: DC
  Administered 2016-05-29 – 2016-06-01 (×4): 6 via TOPICAL

## 2016-05-28 MED ORDER — MUPIROCIN 2 % EX OINT
1.0000 "application " | TOPICAL_OINTMENT | Freq: Two times a day (BID) | CUTANEOUS | Status: AC
  Administered 2016-05-28 – 2016-06-01 (×10): 1 via NASAL
  Filled 2016-05-28: qty 22

## 2016-05-28 MED ORDER — MUPIROCIN 2 % EX OINT
TOPICAL_OINTMENT | CUTANEOUS | Status: AC
  Administered 2016-05-28: 1 via NASAL
  Filled 2016-05-28: qty 22

## 2016-05-28 MED ORDER — PRO-STAT SUGAR FREE PO LIQD
30.0000 mL | Freq: Every day | ORAL | Status: DC
  Administered 2016-05-28 – 2016-06-01 (×5): 30 mL
  Filled 2016-05-28 (×5): qty 30

## 2016-05-28 MED ORDER — IPRATROPIUM-ALBUTEROL 0.5-2.5 (3) MG/3ML IN SOLN: 3.0000 mL | Freq: Four times a day (QID) | RESPIRATORY_TRACT | Status: DC | PRN

## 2016-05-28 MED ORDER — GLUCERNA 1.2 CAL PO LIQD
1000.0000 mL | ORAL | Status: DC
  Administered 2016-05-28 – 2016-06-01 (×5): 1000 mL
  Filled 2016-05-28 (×7): qty 1000

## 2016-05-28 MED ORDER — CHLORHEXIDINE GLUCONATE 0.12 % MT SOLN
15.0000 mL | Freq: Two times a day (BID) | OROMUCOSAL | Status: DC
  Administered 2016-05-28 – 2016-06-01 (×8): 15 mL via OROMUCOSAL
  Filled 2016-05-28 (×6): qty 15

## 2016-05-28 MED ORDER — ENOXAPARIN SODIUM 30 MG/0.3ML ~~LOC~~ SOLN
30.0000 mg | SUBCUTANEOUS | Status: DC
  Administered 2016-05-28 – 2016-05-29 (×2): 30 mg via SUBCUTANEOUS
  Filled 2016-05-28 (×3): qty 0.3

## 2016-05-28 MED ORDER — DEXTROSE 5 % IV SOLN
1.0000 g | INTRAVENOUS | Status: DC
  Administered 2016-05-29 – 2016-06-01 (×4): 1 g via INTRAVENOUS
  Filled 2016-05-28 (×5): qty 1

## 2016-05-28 MED ORDER — INSULIN ASPART 100 UNIT/ML ~~LOC~~ SOLN
0.0000 [IU] | Freq: Four times a day (QID) | SUBCUTANEOUS | Status: DC
  Administered 2016-05-28 – 2016-05-29 (×2): 2 [IU] via SUBCUTANEOUS
  Administered 2016-05-29: 1 [IU] via SUBCUTANEOUS
  Administered 2016-05-29: 3 [IU] via SUBCUTANEOUS
  Administered 2016-05-30: 2 [IU] via SUBCUTANEOUS
  Administered 2016-05-30: 1 [IU] via SUBCUTANEOUS
  Administered 2016-05-30 – 2016-05-31 (×3): 2 [IU] via SUBCUTANEOUS
  Administered 2016-05-31 – 2016-06-01 (×5): 1 [IU] via SUBCUTANEOUS
  Administered 2016-06-01: 2 [IU] via SUBCUTANEOUS

## 2016-05-28 MED ORDER — SODIUM CHLORIDE 0.9 % IV SOLN
30.0000 meq | Freq: Once | INTRAVENOUS | Status: AC
  Administered 2016-05-28: 30 meq via INTRAVENOUS
  Filled 2016-05-28: qty 15

## 2016-05-28 MED ORDER — FREE WATER
200.0000 mL | Freq: Three times a day (TID) | Status: DC
  Administered 2016-05-28 – 2016-05-29 (×4): 200 mL

## 2016-05-28 MED ORDER — INSULIN ASPART 100 UNIT/ML ~~LOC~~ SOLN: 0.0000 [IU] | Freq: Three times a day (TID) | SUBCUTANEOUS | Status: DC

## 2016-05-28 NOTE — Progress Notes (Signed)
Pharmacy Antibiotic Note  Sheila Cisneros is a 76 y.o. female admitted on 05/27/2016 with HCAP. H/o MRSA PNA in November. Pharmacy has been consulted for Vancomycin and Cefepime dosing.  Vanc 1gm and Zosyn 3.375gm IV in ED ~2300  Plan: Cefepime 1gm IV q24h Vancomycin 500mg  IV q24h Will f/u micro data, renal function, and pt's clinical condition Vanc trough prn   Weight: 110 lb (49.9 kg)  Temp (24hrs), Avg:100.7 F (38.2 C), Min:100.2 F (37.9 C), Max:101.2 F (38.4 C)   Recent Labs Lab 05/27/16 2205 05/27/16 2212 05/27/16 2222 05/27/16 2351  WBC 12.8*  --   --   --   CREATININE  --  1.39* 1.40* 1.40*  LATICACIDVEN  --   --  2.62*  --     Estimated Creatinine Clearance: 27.4 mL/min (by C-G formula based on SCr of 1.4 mg/dL (H)).    Allergies  Allergen Reactions  . Codeine Rash and Other (See Comments)     angioedema  . Neomycin Itching  . Penicillins Itching and Rash       . Tetanus Toxoids Rash  . Diphtheria Toxoid-Containing Vaccines Other (See Comments)  . Levofloxacin Rash    Antimicrobials this admission: 2/11 Vanc >>  2/11 Cefepime >>   Dose adjustments this admission: n/a  Microbiology results: 2/11 BCx x2:   Thank you for allowing pharmacy to be a part of this patient's care.  Christoper Fabianaron Kylieann Eagles, PharmD, BCPS Clinical pharmacist, pager [phone] 05/28/2016 12:22 AM

## 2016-05-28 NOTE — Progress Notes (Addendum)
Initial Nutrition Assessment  DOCUMENTATION CODES:   Not applicable  INTERVENTION:   Tube Feeds via PEG tube- Glucerna 1.2 @45ml /hr- Start at 6025ml/hr and increase by 10ml every 8 hours until goal is reached.   Prostat SF daily via PEG  Regimen provides 1396kcal/day (93-107% estimated needs) 80g/day protein (89-100% estimated needs) 17g/day fiber, 869ml free water   Continue free water flushes per MD 200ml every eight hours- This along with TF regimen provides 1469ml free water per day.  NUTRITION DIAGNOSIS:   Inadequate oral intake related to dysphagia as evidenced by other (see comment), NPO status (Pt NPO with PEG tube).  GOAL:   Patient will meet greater than or equal to 90% of their needs  MONITOR:   Labs, Weight trends, Skin, TF tolerance, I & O's  REASON FOR ASSESSMENT:   Malnutrition Screening Tool, Consult Enteral/tube feeding initiation and management  ASSESSMENT:   76 y.o. female with medical history significant of osteoarthritis, chronic lower back pain, COPD, type 2 diabetes, GERD, COPD, hyperlipidemia, hypertension, gout, migraine headaches, multiple episodes of pneumonia who is coming to the emergency department [facility] due to hyperglycemia and hypernatremia. She has also been treated for pneumonia recently   Visited pt's room today. Pt non verbal and unable to communicate. RD unable to obtain any information as pt does not communicate and there was no family at bedside. Per chart, it appears that pt has had a long history of aspiration with pneumonia. Pt was unable to pass SLP evaluation at last admit in November and a PEG tube was placed on 12/4. Pt [facility] Jev[facility].2 [facility] MD note. Pt has been having hyperglycemia and pt's blood glucose on admit was over 400. Pt had one episode of hypoglycemia prior to PEG placement which MD felt was related to poor PO intake. Pt has been getting 1320kcal and 73g protein per  day on home regimen. Pt with moderate to severe muscle wasting and a Stage II pressure injury. RD will start pt on Glucerna as a trial and see if this helps to resolve pt's hyperglycemia. RD will also add Prostat for increased protein needs r/t wound healing and muscle wasting. Pt with hypernatremia on admit. Sodium improved today but still elevated. Pt getting 200ml free water flushes every eight hours. Free water flushes per MD discretion. Pt also with hypokalemia, supplement as necessary per MD. Per chart, pt appears to be weight stable.   Medications reviewed and include: cefepime, lovenox, vancomycin  Labs reviewed: Na 157(H), K 3.4(L), Cl 120(H), BUN 41(H), creat 1.3(H), Ca 11.1(H) adj. 12.46(H), Alb 2.3(L), AST 43(H) Wbc- 13.6(H) cbgs- 366, 378, 317, 150 x 24 hrs AIC 6.4(H) 01/2016   Nutrition-Focused physical exam completed. Findings are mild fat depletion in chest area, moderate to severe muscle depletion in clavicle, temporal, shoulder, and calf regions, and no edema.   Diet Order:  Diet NPO time specified  Skin:  Wound (see comment) (Stage II buttocks)  Last BM:  2/12  Height:   Ht Readings from Last 1 Encounters:  05/28/16 5\' 2"  (1.575 m)    Weight:   Wt Readings from Last 1 Encounters:  05/27/16 110 lb (49.9 kg)    Ideal Body Weight:  50 kg  BMI:  Body mass index is 20.12 kg/m.  Estimated Nutritional Needs:   Kcal:  1300-1500kcal/day   Protein:  80-90g/day   Fluid:  181ml/kcal/day or per MD  EDUCATION NEEDS:  No education needs identified at this time  Betsey Holiday, RD, LDN Pager #[phone]

## 2016-05-28 NOTE — Care Management Note (Signed)
Case Management Note  Patient Details  Name: Sheila Cisneros MRN: [id number] Date of Birth: [birth year]  Subjective/Objective: adm w hypernatremia                  Action/Plan: from snf   Expected Discharge Date:                  Expected Discharge Plan:  Skilled Nursing Facility  In-House Referral:  Clinical Social Work  Discharge planning Services     Post Acute Care Choice:    Choice offered to:     DME Arranged:    DME Agency:     HH Arranged:    HH Agency:     Status of Service:  In process, will continue to follow  If discussed at Long Length of Stay Meetings, dates discussed:    Additional Comments: sw ref placed.  [name], [name] T, RN 05/28/2016, 11:19 AM

## 2016-05-28 NOTE — Progress Notes (Signed)
PROGRESS NOTE    Sheila Cisneros  RUE:[id number] DOB: 1940-06-16 DOA: 05/27/2016 PCP: Sanda Lingerhomas Jones, MD   Brief Narrative: 76 y.o. female with medical history significant of osteoarthritis, chronic lower back pain, COPD, type 2 diabetes, GERD, COPD, hyperlipidemia, hypertension, gout, migraine headaches, multiple episodes of pneumonia who is coming to the emergency department [facility] due to hyperglycemia and hypernatremia. chest radiograph showed a left patchy basilar infiltrate.  Patient was admitted early morning today, Please see H&P by Dr. Robb Matarrtiz for detail.  Assessment & Plan:  #  Hypernatremia: Due to dehydration. Patient looked dry on physical exam. -Dietary consult called to initiate tube feeding. Tube feed  was restarted later on. I also added free water from the tube feed. Monitor labs. -Check urine sodium and chloride and urine osmolality.  #Healthcare associated pneumonia: Continue vancomycin and cefepime. Follow culture results. Continue nebulizing treatment as needed.. -Chest x-ray with patchy left basilar infiltrates. May need PA and lateral x-ray in 3-4 weeks  # Acute kidney injury: Acute kidney injury due to dehydration/prerenal. UA negative for UTI. Serum creatinine level improving with IV fluid. Monitor lab.  #Hypertension: Blood pressure acceptable. Continue to monitor.  #Hypokalemia: Improved now. Monitor lab.  #Dementia without behavioral disturbance: We'll consult palliative care. Continue supportive care. Patient was nonverbal this morning, unknown baseline mental status.  #Insulin-dependent diabetes: Resume home dose of insulin. Monitor blood sugar level. Currently on tube feeding.  Principal Problem:   Hypernatremia Active Problems:   Essential hypertension   Type II diabetes mellitus with manifestations (HCC)   Hypokalemia   COPD (chronic obstructive pulmonary disease) (HCC)   HCAP (healthcare-associated pneumonia)   Acute kidney injury  (HCC)   Dementia without behavioral disturbance  DVT prophylaxis: Lovenox subcutaneous Code Status: Full code Family Communication: No family present at bedside Disposition Plan: Likely discharge to nursing home in 1-2 days. Social worker consulted    Consultants:   Palliative care  Procedures: None Antimicrobials: Vancomycin and cefepime  Subjective: Patient was seen and examined at bedside. Patient was minimally responsive to painful stimuli. Unknown baseline mental status. Unable to obtain review of system.  Objective: Vitals:   05/28/16 1200 05/28/16 1300 05/28/16 1400 05/28/16 1500  BP: 104/61 111/71 (!) 102/59 116/73  Pulse: 95 93  92  Resp: (!) 22 (!) 23 (!) 23 (!) 22  Temp:      TempSrc:      SpO2: 98% 95%  96%  Weight:      Height:  5\' 2"  (1.575 m)      Intake/Output Summary (Last 24 hours) at 05/28/16 1516 Last data filed at 05/28/16 1500  Gross per 24 hour  Intake             4225 ml  Output                0 ml  Net             4225 ml   Filed Weights   05/27/16 2239  Weight: 49.9 kg (110 lb)    Examination:  General exam: Minimally responsive to painful stimuli. Not in distress Respiratory system: Clear to auscultation. Respiratory effort normal.  Cardiovascular system: S1 & S2 heard, RRR.  No pedal edema. Gastrointestinal system: Abdomen is nondistended, soft . Normal bowel sounds heard. Feeding tube site clean Extremities: unable to assess Skin: No rashes, lesions or ulcers Psychiatry: Unable to assess.    Data Reviewed: I have personally reviewed following labs and imaging  studies  CBC:  Recent Labs Lab 05/27/16 2205 05/27/16 2222 05/27/16 2351 05/28/16 0450  WBC 12.8*  --   --  13.6*  NEUTROABS 9.2*  --   --  9.4*  HGB 12.2 13.6 10.9* 10.9*  HCT 41.5 40.0 32.0* 36.9  MCV 97.6  --   --  96.9  PLT 169  --   --  137*   Basic Metabolic Panel:  Recent Labs Lab 05/27/16 2212 05/27/16 2222 05/27/16 2351 05/28/16 0450  05/28/16 1238  NA 159* 164* 165* 157* 156*  K 3.4* 3.2* 3.3* 3.4* 3.9  CL 120* 121* 120* 120* 122*  CO2 28  --   --  30 28  GLUCOSE 366* 378* 317* 150* 189*  BUN 47* 51* 49* 41* 41*  CREATININE 1.39* 1.40* 1.40* 1.30* 1.23*  CALCIUM 12.5*  --   --  11.1* 11.2*   GFR: Estimated Creatinine Clearance: 31.1 mL/min (by C-G formula based on SCr of 1.23 mg/dL (H)). Liver Function Tests:  Recent Labs Lab 05/27/16 2212 05/28/16 0450  AST 48* 43*  ALT 69* 54  ALKPHOS 142* 114  BILITOT 0.4 1.1  PROT 7.9 6.9  ALBUMIN 2.6* 2.3*   No results for input(s): LIPASE, AMYLASE in the last 168 hours. No results for input(s): AMMONIA in the last 168 hours. Coagulation Profile:  Recent Labs Lab 05/28/16 0450  INR 1.22   Cardiac Enzymes: No results for input(s): CKTOTAL, CKMB, CKMBINDEX, TROPONINI in the last 168 hours. BNP (last 3 results) No results for input(s): PROBNP in the last 8760 hours. HbA1C: No results for input(s): HGBA1C in the last 72 hours. CBG:  Recent Labs Lab 05/28/16 0148 05/28/16 0820  GLUCAP 272* 170*   Lipid Profile: No results for input(s): CHOL, HDL, LDLCALC, TRIG, CHOLHDL, LDLDIRECT in the last 72 hours. Thyroid Function Tests: No results for input(s): TSH, T4TOTAL, FREET4, T3FREE, THYROIDAB in the last 72 hours. Anemia Panel: No results for input(s): VITAMINB12, FOLATE, FERRITIN, TIBC, IRON, RETICCTPCT in the last 72 hours. Sepsis Labs:  Recent Labs Lab 05/27/16 2222 05/28/16 0035  LATICACIDVEN 2.62* 2.32*    Recent Results (from the past 240 hour(s))  Blood Culture (routine x 2)     Status: None (Preliminary result)   Collection Time: 05/27/16 10:11 PM  Result Value Ref Range Status   Specimen Description BLOOD RIGHT WRIST  Final   Special Requests   Final    BOTTLES DRAWN AEROBIC AND ANAEROBIC 5CC BLUE 4CC RED   Culture NO GROWTH < 12 HOURS  Final   Report Status PENDING  Incomplete  Blood Culture (routine x 2)     Status: None (Preliminary  result)   Collection Time: 05/27/16 10:37 PM  Result Value Ref Range Status   Specimen Description BLOOD RIGHT HAND  Final   Special Requests IN PEDIATRIC BOTTLE 3CC  Final   Culture NO GROWTH < 12 HOURS  Final   Report Status PENDING  Incomplete  MRSA PCR Screening     Status: Abnormal   Collection Time: 05/28/16  6:42 AM  Result Value Ref Range Status   MRSA by PCR POSITIVE (A) NEGATIVE Final    Comment:        The GeneXpert MRSA Assay (FDA approved for NASAL specimens only), is one component of a comprehensive MRSA colonization surveillance program. It is not intended to diagnose MRSA infection nor to guide or monitor treatment for MRSA infections. RESULT CALLED TO, READ BACK BY AND VERIFIED WITH: E. Christell Constant [phone] 02.12.2018  N MORRIS          Radiology Studies: Dg Chest Portable 1 View  Result Date: 05/27/2016 CLINICAL DATA:  Altered mental status EXAM: PORTABLE CHEST 1 VIEW COMPARISON:  03/10/2016 FINDINGS: Cardiac shadow is within normal limits. Previously seen endotracheal tube and nasogastric catheter have been removed. Patchy left basilar infiltrate is seen. The right lung is clear. IMPRESSION: Patchy left basilar infiltrate. Followup PA and lateral chest X-ray is recommended in 3-4 weeks following trial of antibiotic therapy to ensure resolution and exclude underlying malignancy. Electronically Signed   By: Alcide Clever M.D.   On: 05/27/2016 22:23        Scheduled Meds: . ceFEPime (MAXIPIME) IV  1 g Intravenous Q24H  . chlorhexidine  15 mL Mouth Rinse BID  . [START ON 05/29/2016] Chlorhexidine Gluconate Cloth  6 each Topical Q0600  . enoxaparin (LOVENOX) injection  30 mg Subcutaneous Q24H  . feeding supplement (PRO-STAT SUGAR FREE 64)  30 mL Per Tube Daily  . free water  200 mL Per Tube Q8H  . mouth rinse  15 mL Mouth Rinse q12n4p  . mupirocin ointment  1 application Nasal BID  . sodium chloride flush  3 mL Intravenous Q12H  . vancomycin  500 mg Intravenous Q24H    Continuous Infusions: . sodium chloride 150 mL/hr at 05/28/16 1500  . feeding supplement (GLUCERNA 1.2 CAL)       LOS: 1 day    Dron Jaynie Collins, MD [facility] Pager [phone]  If 7PM-7AM, please contact night-coverage [email or web address] Password [facility] 05/28/2016, 3:16 PM

## 2016-05-28 NOTE — ED Notes (Signed)
Admitting MD at bedside.

## 2016-05-28 NOTE — H&P (Signed)
History and Physical    Sheila Cisneros:[id number] DOB: [birth year] DOA: 05/27/2016  PCP: Sanda Linger, MD   Patient coming from: Skilled nursing facility.  Chief Complaint: Hyperglycemia.  HPI: Sheila Cisneros is a 76 y.o. female with medical history significant of osteoarthritis, chronic lower back pain, COPD, type 2 diabetes, GERD, COPD, hyperlipidemia, hypertension, gout, migraine headaches, multiple episodes of pneumonia who is coming to the emergency department via EMS from [facility] due to hyperglycemia and hypernatremia. She has also been treated for pneumonia recently. No further history available at this time.  ED Course: The patient received IV vancomycin and Zosyn, IV fluids, supplemental oxygen and electrolyte replacement. Her urinalysis showed glucosuria. WBC was 12.8, hemoglobin 12.2 mg/dL and platelets 956. Her sodium 159, potassium 3.4, chloride 120, bicarbonate 28 mmol/L. Her BUN was 47, creatinine 1.39 and glucose 366 mg/dL. Her albumin 2.6 g/dL. AST 48, ALT 69 and alkaline phosphatase 142 units. Her chest radiograph showed a left patchy basilar infiltrate.  Review of Systems: As per HPI otherwise 10 point review of systems negative.    Past Medical History:  Diagnosis Date  . Altered mental status 12/09/2012  . Arthritis    "all over" (12/09/2012)  . Chronic lower back pain   . COPD (chronic obstructive pulmonary disease) (HCC)   . Diabetes mellitus without complication (HCC)   . Exertional shortness of breath   . GERD (gastroesophageal reflux disease)    rolaids if needed  . Gout, unspecified   . Hepatitis, unspecified    "the one that's not bad" (12/09/2012)  . History of blood transfusion    "related to a surgery, I think" (12/09/2012)  . Hyperlipidemia   . Hypertension   . Migraines    "in the past" (12/09/2012)  . Osteoarthrosis, unspecified whether generalized or localized, unspecified site   . Other chronic sinusitis   . Pneumonia    "couple times; long  time ago" (12/09/2012)  . Unspecified chronic bronchitis    sees Dr. Melba Coon, last visit 08/2011, treated for E. Coli- resp. /w Ceftin, Feb. 2013    Past Surgical History:  Procedure Laterality Date  . ABDOMINAL HYSTERECTOMY     "w/right ovary" (12/09/2012)  . APPENDECTOMY    . BREAST BIOPSY Right   . CARPAL TUNNEL RELEASE  09/06/2011   Procedure: CARPAL TUNNEL RELEASE;  Surgeon: Kennieth Rad, MD;  Location: [facility] OR;  Service: Orthopedics;  Laterality: Right;  . ESOPHAGOGASTRODUODENOSCOPY N/A 06/04/2013   Procedure: ESOPHAGOGASTRODUODENOSCOPY (EGD);  Surgeon: Graylin Shiver, MD;  Location[facility] ENDOSCOPY;  Service: Endoscopy;  Laterality: N/A;  . IR GENERIC HISTORICAL  03/19/2016   IR GASTROSTOMY TUBE MOD SED 03/19/2016 Oley Balm, MD [facility]  . SHOULDER ARTHROSCOPY W/ ROTATOR CUFF REPAIR Right   . TONSILLECTOMY    . TUBAL LIGATION       reports that she has never smoked. She has never used smokeless tobacco. She reports that she does not drink alcohol or use drugs.  Allergies  Allergen Reactions  . Codeine Rash and Other (See Comments)     angioedema  . Neomycin Itching  . Penicillins Itching and Rash       . Tetanus Toxoids Rash  . Diphtheria Toxoid-Containing Vaccines Other (See Comments)  . Levofloxacin Rash    Family History  Problem Relation Age of Onset  . Heart disease Father   . ALS Brother     Prior to Admission medications   Medication Sig Start Date End Date Taking?  Authorizing Provider  acetaminophen (TYLENOL) 500 MG tablet Place 1 tablet (500 mg total) into feeding tube every 6 (six) hours as needed. For pain 03/20/16  Yes Osvaldo Shipper, MD  Ascorbic Acid (VITAMIN C) 500 MG tablet Take 500 mg by mouth daily. Per tube   Yes Historical Provider, MD  aspirin 81 MG chewable tablet Chew 81 mg by mouth daily. Per tube   Yes Historical Provider, MD  bisacodyl (DULCOLAX) 10 MG suppository Place 10 mg rectally as needed for moderate constipation.   Yes Historical  Provider, MD  cholecalciferol (VITAMIN D) 1000 UNITS tablet Take 1,000 Units by mouth daily. Per tube   Yes Historical Provider, MD  insulin aspart (NOVOLOG) 100 UNIT/ML injection Inject 10 Units into the skin once.   Yes Historical Provider, MD  insulin glargine (LANTUS) 100 UNIT/ML injection Inject 10 Units into the skin at bedtime.   Yes Historical Provider, MD  ipratropium-albuterol (DUONEB) 0.5-2.5 (3) MG/3ML SOLN Take 3 mLs by nebulization every 6 (six) hours as needed (for shortness of breath).   Yes Historical Provider, MD  magnesium hydroxide (MILK OF MAGNESIA) 400 MG/5ML suspension Take 30 mLs by mouth daily as needed for mild constipation.   Yes Historical Provider, MD  Multiple Vitamin (TAB-A-VITE) TABS Place 1 tablet into feeding tube daily.   Yes Historical Provider, MD  Nutritional Supplements (FIBERSOURCE PO) 55 mL/hr by PEG Tube route continuous. Fibersource 1.2 cal   Yes Historical Provider, MD  Sodium Phosphates (RA SALINE ENEMA) 19-7 GM/118ML ENEM Place 1 each rectally as needed (for constipation).   Yes Historical Provider, MD  spironolactone (ALDACTONE) 25 MG tablet Place 25 mg into feeding tube 2 (two) times daily.   Yes Historical Provider, MD  Tiotropium Bromide-Olodaterol (STIOLTO RESPIMAT) 2.5-2.5 MCG/ACT AERS Inhale 2 puffs into the lungs daily. 10/17/15  Yes Etta Grandchild, MD  venlafaxine (EFFEXOR) 37.5 MG tablet Place 1 tablet (37.5 mg total) into feeding tube See admin instructions. Takes 1 capsule daily, then can take 1 addt'l cap as needed for hot flashes Patient taking differently: Place 37.5 mg into feeding tube daily.  03/20/16  Yes Osvaldo Shipper, MD  venlafaxine (EFFEXOR) 37.5 MG tablet Place 37.5 mg into feeding tube as needed (for hot flashes).   Yes Historical Provider, MD  verapamil (CALAN) 40 MG tablet Place 40 mg into feeding tube every 8 (eight) hours.   Yes Historical Provider, MD  aspirin EC 81 MG tablet Take 1 tablet (81 mg total) by mouth daily. Patient  not taking: Reported on 05/27/2016 03/20/16   Osvaldo Shipper, MD  Multiple Vitamin (MULTIVITAMIN) capsule Place 1 capsule into feeding tube daily. Patient not taking: Reported on 05/27/2016 03/20/16   Osvaldo Shipper, MD  Nutritional Supplements (FEEDING SUPPLEMENT, JEVITY 1.2 CAL,) LIQD Place 1,000 mLs into feeding tube continuous. Patient not taking: Reported on 05/27/2016 03/20/16   Osvaldo Shipper, MD  simvastatin (ZOCOR) 20 MG tablet Place 1 tablet (20 mg total) into feeding tube at bedtime. Patient not taking: Reported on 05/27/2016 03/20/16   Osvaldo Shipper, MD  verapamil (COVERA HS) 240 MG (CO) 24 hr tablet 240mg  daily at bedtime via Tube. Patient not taking: Reported on 05/27/2016 03/20/16   Osvaldo Shipper, MD    Physical Exam:  Constitutional: Obtunded. He is acutely ill. Vitals:   05/27/16 2330 05/28/16 0000 05/28/16 0015 05/28/16 0045  BP: 114/73 112/62 111/67 106/66  Pulse: 100 95 96 95  Resp: 26 25 26 24   Temp:      TempSrc:  SpO2: 100% 100% 100% 100%  Weight:       Eyes: PERRL, lids and conjunctivae normal ENMT: Mucous membranes are dry. Posterior pharynx clear of any exudate or lesions. Neck: normal, supple, no masses, no thyromegaly Respiratory: Decreased breath sounds, Bilateral rhonchi, Left lower lobe crackles. Mildly tachypneic. No accessory muscle use.  Cardiovascular: Regular rate and rhythm, no murmurs / rubs / gallops. No extremity edema. 2+ pedal pulses. No carotid bruits.  Abdomen: Soft, no tenderness, no masses palpated. No hepatosplenomegaly. Bowel sounds positive.  Musculoskeletal: no clubbing / cyanosis. Positive spurts and calluses of first MTPs Skin: Skin looks very dry. Stage II sacral decubitus ulcer, hyperkeratosis of the soles. Neurologic: Obtunded with decreased muscle tone Psychiatric: Obtunded, opens eyes.   Labs on Admission: I have personally reviewed following labs and imaging studies  CBC:  Recent Labs Lab 05/27/16 2205 05/27/16 2222  05/27/16 2351  WBC 12.8*  --   --   NEUTROABS 9.2*  --   --   HGB 12.2 13.6 10.9*  HCT 41.5 40.0 32.0*  MCV 97.6  --   --   PLT 169  --   --    Basic Metabolic Panel:  Recent Labs Lab 05/27/16 2212 05/27/16 2222 05/27/16 2351  NA 159* 164* 165*  K 3.4* 3.2* 3.3*  CL 120* 121* 120*  CO2 28  --   --   GLUCOSE 366* 378* 317*  BUN 47* 51* 49*  CREATININE 1.39* 1.40* 1.40*  CALCIUM 12.5*  --   --    GFR: Estimated Creatinine Clearance: 27.4 mL/min (by C-G formula based on SCr of 1.4 mg/dL (H)). Liver Function Tests:  Recent Labs Lab 05/27/16 2212  AST 48*  ALT 69*  ALKPHOS 142*  BILITOT 0.4  PROT 7.9  ALBUMIN 2.6*   No results for input(s): LIPASE, AMYLASE in the last 168 hours. No results for input(s): AMMONIA in the last 168 hours. Coagulation Profile: No results for input(s): INR, PROTIME in the last 168 hours. Cardiac Enzymes: No results for input(s): CKTOTAL, CKMB, CKMBINDEX, TROPONINI in the last 168 hours. BNP (last 3 results) No results for input(s): PROBNP in the last 8760 hours. HbA1C: No results for input(s): HGBA1C in the last 72 hours. CBG:  Recent Labs Lab 05/28/16 0148  GLUCAP 272*   Lipid Profile: No results for input(s): CHOL, HDL, LDLCALC, TRIG, CHOLHDL, LDLDIRECT in the last 72 hours. Thyroid Function Tests: No results for input(s): TSH, T4TOTAL, FREET4, T3FREE, THYROIDAB in the last 72 hours. Anemia Panel: No results for input(s): VITAMINB12, FOLATE, FERRITIN, TIBC, IRON, RETICCTPCT in the last 72 hours. Urine analysis:    Component Value Date/Time   COLORURINE YELLOW 05/27/2016 2151   APPEARANCEUR HAZY (A) 05/27/2016 2151   LABSPEC 1.022 05/27/2016 2151   PHURINE 8.0 05/27/2016 2151   GLUCOSEU >=500 (A) 05/27/2016 2151   GLUCOSEU NEGATIVE 03/23/2015 1432   HGBUR NEGATIVE 05/27/2016 2151   BILIRUBINUR NEGATIVE 05/27/2016 2151   KETONESUR NEGATIVE 05/27/2016 2151   PROTEINUR NEGATIVE 05/27/2016 2151   UROBILINOGEN 0.2 03/23/2015  1432   NITRITE NEGATIVE 05/27/2016 2151   LEUKOCYTESUR NEGATIVE 05/27/2016 2151    Radiological Exams on Admission: Dg Chest Portable 1 View  Result Date: 05/27/2016 CLINICAL DATA:  Altered mental status EXAM: PORTABLE CHEST 1 VIEW COMPARISON:  03/10/2016 FINDINGS: Cardiac shadow is within normal limits. Previously seen endotracheal tube and nasogastric catheter have been removed. Patchy left basilar infiltrate is seen. The right lung is clear. IMPRESSION: Patchy left basilar infiltrate. Followup PA  and lateral chest X-ray is recommended in 3-4 weeks following trial of antibiotic therapy to ensure resolution and exclude underlying malignancy. Electronically Signed   By: Alcide CleverMark  Lukens M.D.   On: 05/27/2016 22:23    EKG: Independently reviewed. Vent. rate 114 BPM PR interval * ms QRS duration 70 ms QT/QTc 317/437 ms P-R-T axes 42 16 75 Sinus tachycardia Consider left ventricular hypertrophy Nonspecific T abnormalities, lateral leads  Assessment/Plan Principal Problem:   Hypernatremia Calculated free water deficit to a sodium of 140 mmol/L is 3.1 L. Admit to stepdown/inpatient. Continue half normal saline infusion. Monitor BUN, creatinine and sodium level.  Active Problems:   Acute kidney injury (HCC) Secondary to dehydration. Continue hypotonic IV fluid infusion. Follow-up electrolytes, BUN and creatinine.    HCAP (healthcare associated pneumonia) Continue supplemental oxygen. Bronchodilators as needed. Check strep pneumoniae and legionella urinary antigens. Check a sputum Gram stain, culture and sensitivity. Follow-up blood cultures and sensitivity. Continue vancomycin. Use cefepime instead of Zosyn to avoid further sodium load.    COPD (chronic obstructive pulmonary disease) (HCC) Continue supplemental oxygen Uncle dilators as needed. Continue treatment for pneumonia.    Essential hypertension Antihypertensives on hold now. Monitor blood pressure.    Type II  diabetes mellitus with manifestations (HCC) Continue half normal saline infusion. CBG monitoring every 6 hours. Regular insulin as needed.    Hypokalemia Replacing. Follow-up potassium level.      Dementia without behavioral disturbance Continue supportive care. Consider palliative care evaluation given functional status, age and medical history.    Abnormal LFTs and hypoalbuminemia. Follow-up LFTs.    Stage II decubitus ulcer Continue local care monitoring.   DVT prophylaxis: Lovenox SQ. Code Status: Full code. Family Communication: Her nephew Lorin PicketScott was present in the room. Disposition Plan: Admit for IV hydration and IV antibiotic therapy. Consults called:  Admission status: Inpatient/stepdown.   Bobette Moavid Manuel Wane Mollett MD [facility] Pager [phone].  If 7PM-7AM, please contact night-coverage [email or web address] Password [email or web address]  05/28/2016, 1:52 AM

## 2016-05-28 NOTE — ED Notes (Signed)
pts 1st liter of  0.45 nss infused 2nd bag added to run at 15550ml/hr

## 2016-05-29 ENCOUNTER — Inpatient Hospital Stay (HOSPITAL_COMMUNITY): Payer: Medicare Other

## 2016-05-29 DIAGNOSIS — E870 Hyperosmolality and hypernatremia: Secondary | ICD-10-CM

## 2016-05-29 LAB — CBC WITH DIFFERENTIAL/PLATELET
Basophils Absolute: 0 10*3/uL (ref 0.0–0.1)
Basophils Relative: 0 %
EOS ABS: 0.5 10*3/uL (ref 0.0–0.7)
Eosinophils Relative: 4 %
HEMATOCRIT: 30.9 % — AB (ref 36.0–46.0)
HEMOGLOBIN: 9.3 g/dL — AB (ref 12.0–15.0)
LYMPHS ABS: 3 10*3/uL (ref 0.7–4.0)
LYMPHS PCT: 25 %
MCH: 28.4 pg (ref 26.0–34.0)
MCHC: 30.1 g/dL (ref 30.0–36.0)
MCV: 94.2 fL (ref 78.0–100.0)
Monocytes Absolute: 0.5 10*3/uL (ref 0.1–1.0)
Monocytes Relative: 4 %
NEUTROS ABS: 8.2 10*3/uL — AB (ref 1.7–7.7)
NEUTROS PCT: 67 %
Platelets: 131 10*3/uL — ABNORMAL LOW (ref 150–400)
RBC: 3.28 MIL/uL — AB (ref 3.87–5.11)
RDW: 17.5 % — ABNORMAL HIGH (ref 11.5–15.5)
WBC: 12.2 10*3/uL — AB (ref 4.0–10.5)

## 2016-05-29 LAB — BLOOD CULTURE ID PANEL (REFLEXED)

## 2016-05-29 LAB — GLUCOSE, CAPILLARY
GLUCOSE-CAPILLARY: 236 mg/dL — AB (ref 65–99)
Glucose-Capillary: 186 mg/dL — ABNORMAL HIGH (ref 65–99)
Glucose-Capillary: 151 mg/dL — ABNORMAL HIGH (ref 65–99)
Glucose-Capillary: 127 mg/dL — ABNORMAL HIGH (ref 65–99)
Glucose-Capillary: 108 mg/dL — ABNORMAL HIGH (ref 65–99)

## 2016-05-29 LAB — BASIC METABOLIC PANEL
Anion gap: 9 (ref 5–15)
BUN: 42 mg/dL — AB (ref 6–20)
CHLORIDE: 114 mmol/L — AB (ref 101–111)
CO2: 24 mmol/L (ref 22–32)
Calcium: 10 mg/dL (ref 8.9–10.3)
Creatinine, Ser: 1 mg/dL (ref 0.44–1.00)
GFR calc Af Amer: >60 mL/min (ref >60)
GFR calc non Af Amer: 54 mL/min — ABNORMAL LOW (ref >60)
Glucose, Bld: 263 mg/dL — ABNORMAL HIGH (ref 65–99)
POTASSIUM: 3.4 mmol/L — AB (ref 3.5–5.1)
SODIUM: 147 mmol/L — AB (ref 135–145)

## 2016-05-29 LAB — LACTIC ACID, PLASMA: Lactic Acid, Venous: 1.8 mmol/L (ref 0.5–1.9)

## 2016-05-29 LAB — AMMONIA: Ammonia: 30 umol/L (ref 9–35)

## 2016-05-29 MED ORDER — FREE WATER
250.0000 mL | Freq: Three times a day (TID) | Status: DC
  Administered 2016-05-29 – 2016-05-31 (×7): 250 mL

## 2016-05-29 MED ORDER — VANCOMYCIN HCL IN DEXTROSE 750-5 MG/150ML-% IV SOLN
750.0000 mg | INTRAVENOUS | Status: DC
  Administered 2016-05-29 – 2016-05-30 (×2): 750 mg via INTRAVENOUS
  Filled 2016-05-29 (×2): qty 150

## 2016-05-29 MED ORDER — POTASSIUM CHLORIDE CRYS ER 20 MEQ PO TBCR: 40.0000 meq | EXTENDED_RELEASE_TABLET | Freq: Once | ORAL | Status: DC

## 2016-05-29 MED ORDER — POTASSIUM CHLORIDE 20 MEQ PO PACK
40.0000 meq | PACK | Freq: Once | ORAL | Status: AC
  Administered 2016-05-29: 40 meq
  Filled 2016-05-29: qty 2

## 2016-05-29 NOTE — Progress Notes (Signed)
Palliative Care  Pt is unable to meaningfully participate in conversations surrounding goals of care. I called her HCPOA, Pryor CuriaScott Staples (he is her nephew). He asked to meet tomorrow at 2:30 to discuss his aunt's current health state and plans moving forward. He did recall his conversation with PA York in November of this past year, and was appreciative of Palliative's ongoing involvement in his aunt's care.   PLAN Meet with HCPOA on 2/14 at 2:30pm.   Murrell ConverseSarah Easten Maceachern [facility] Center[facility]NP-C Palliative Care [phone] (cell, 7a-4p) [phone] (team phone, after hours and weekend)  No charge note.

## 2016-05-29 NOTE — Progress Notes (Signed)
PHARMACY - PHYSICIAN COMMUNICATION CRITICAL VALUE ALERT - BLOOD CULTURE IDENTIFICATION (BCID)  Results for orders placed or performed during the hospital encounter of 05/27/16  Blood Culture ID Panel (Reflexed) (Collected: 05/27/2016 10:11 PM)  Result Value Ref Range   Enterococcus species NOT DETECTED NOT DETECTED   Listeria monocytogenes NOT DETECTED NOT DETECTED   Staphylococcus species NOT DETECTED NOT DETECTED   Staphylococcus aureus NOT DETECTED NOT DETECTED   Streptococcus species NOT DETECTED NOT DETECTED   Streptococcus agalactiae NOT DETECTED NOT DETECTED   Streptococcus pneumoniae NOT DETECTED NOT DETECTED   Streptococcus pyogenes NOT DETECTED NOT DETECTED   Acinetobacter baumannii NOT DETECTED NOT DETECTED   Enterobacteriaceae species NOT DETECTED NOT DETECTED   Enterobacter cloacae complex NOT DETECTED NOT DETECTED   Escherichia coli NOT DETECTED NOT DETECTED   Klebsiella oxytoca NOT DETECTED NOT DETECTED   Klebsiella pneumoniae NOT DETECTED NOT DETECTED   Proteus species NOT DETECTED NOT DETECTED   Serratia marcescens NOT DETECTED NOT DETECTED   Haemophilus influenzae NOT DETECTED NOT DETECTED   Neisseria meningitidis NOT DETECTED NOT DETECTED   Pseudomonas aeruginosa NOT DETECTED NOT DETECTED   Candida albicans NOT DETECTED NOT DETECTED   Candida glabrata NOT DETECTED NOT DETECTED   Candida krusei NOT DETECTED NOT DETECTED   Candida parapsilosis NOT DETECTED NOT DETECTED   Candida tropicalis NOT DETECTED NOT DETECTED    Name of physician (or Provider) Contacted: None  Changes to prescribed antibiotics required: None  GPR on gram stain, on cefepime and vancomycin currently, await final culture results  Elwin Sleightowell, Jonetta Dagley Kay 05/29/2016  8:28 PM

## 2016-05-29 NOTE — Progress Notes (Signed)
Pharmacy Antibiotic Note  Sheila Cisneros is a 76 y.o. female admitted on 05/27/2016 with HCAP. H/o MRSA PNA in November. Pharmacy has been consulted for Vancomycin and Cefepime dosing. -WBC= 12.2, afeb, SCr= 1.0, CrCl ~ 40  Plan: Cefepime 1gm IV q24h Change Vancomycin to 750mg  IV q24h Will f/u micro data, renal function, and pt's clinical condition Vanc trough prn   Height: 5\' 2"  (157.5 cm) Weight: 110 lb (49.9 kg) IBW/kg (Calculated) : 50.1  Temp (24hrs), Avg:98.6 F (37 C), Min:97.5 F (36.4 C), Max:99.6 F (37.6 C)   Recent Labs Lab 05/27/16 2205  05/27/16 2222 05/27/16 2351 05/28/16 0035 05/28/16 0450 05/28/16 1238 05/29/16 0135  WBC 12.8*  --   --   --   --  13.6*  --  12.2*  CREATININE  --   < > 1.40* 1.40*  --  1.30* 1.23* 1.00  LATICACIDVEN  --   --  2.62*  --  2.32*  --   --   --   < > = values in this interval not displayed.  Estimated Creatinine Clearance: 38.3 mL/min (by C-G formula based on SCr of 1 mg/dL).    Allergies  Allergen Reactions  . Codeine Rash and Other (See Comments)     angioedema  . Neomycin Itching  . Penicillins Itching and Rash       . Tetanus Toxoids Rash  . Diphtheria Toxoid-Containing Vaccines Other (See Comments)  . Levofloxacin Rash    Antimicrobials this admission: 2/11 Vanc >>  2/11 Cefepime >>   Dose adjustments this admission: n/a  Microbiology results: 2/11 BCx x2:  ngtd 2/12 MRSA PCR- pos  Thank you for allowing pharmacy to be a part of this patient's care.  Harland GermanAndrew Jennene Downie, Pharm D 05/29/2016 8:34 AM

## 2016-05-29 NOTE — Progress Notes (Signed)
PROGRESS NOTE    Sheila Cisneros  WGN:[id number] DOB: 1941/01/10 DOA: 05/27/2016 PCP: Sanda Linger, MD   Brief Narrative: 76 y.o.femalewith medical history significant of osteoarthritis, chronic lower back pain, COPD, type 2 diabetes, GERD, COPD, hyperlipidemia, hypertension, gout, migraine headaches, multiple episodes of pneumonia who is coming to the emergency department via EMS from [facility] due to hyperglycemia and hypernatremia. chest radiograph showeda left patchy basilar infiltrate.   Assessment & Plan:   Principal Problem:   Hypernatremia Active Problems:   Essential hypertension   Type II diabetes mellitus with manifestations (HCC)   Hypokalemia   COPD (chronic obstructive pulmonary disease) (HCC)   HCAP (healthcare-associated pneumonia)   Acute kidney injury (HCC)   Dementia without behavioral disturbance   Hypernatremia; related to hypovolemia.  Continue with IV fluids, free water per tube feeding.  Sodium trending down.   Dementia, Acute Encephalopathy ?  Unclear baseline, but non verbal per records.  Will check CT head.  Check ammonia level  Palliative care consulted.   PNA, Health care associated.  Strep pneumonia negative.  Continue with IV antibiotics.   Transaminases.  Trending down.   Hypokalemia;  Replete orally,.   Lower extremity edema; check doppler.   DM; SSI. Low dose lantus   DVT prophylaxis:  Code Status: full code.  Family Communication: no family at bedside.  Disposition Plan: remain inpatient for IV fluids, IV antibiotics, palliative care consult.    Consultants:   Palliative    Procedures:   none   Antimicrobials:  Cefepime 2-12  Vancomycin 2-12   Subjective: patient is non verbal. Not following command   Objective: Vitals:   05/29/16 0400 05/29/16 0500 05/29/16 0700 05/29/16 0807  BP: (!) 142/71 128/61 128/70 (!) 147/85  Pulse: 91 89 85 84  Resp: (!) 22 (!) 21 (!) 21 (!) 26  Temp: 99.6 F (37.6 C)    97.5 F (36.4 C)  TempSrc: Oral   Oral  SpO2: 97% 97% 97% 97%  Weight:      Height:        Intake/Output Summary (Last 24 hours) at 05/29/16 0817 Last data filed at 05/29/16 0700  Gross per 24 hour  Intake          4268.25 ml  Output                0 ml  Net          4268.25 ml   Filed Weights   05/27/16 2239  Weight: 49.9 kg (110 lb)    Examination:  General exam: lethargic.  Respiratory system: Respiratory effort normal. Bilateral ronchus Cardiovascular system: S1 & S2 heard, RRR. No JVD, murmurs, rubs, gallops or clicks.  Gastrointestinal system: Abdomen is nondistended, soft and nontender. No organomegaly or masses felt. Normal bowel sounds heard. Peg tube in place.  Central nervous system: Alert and oriented. No focal neurological deficits. Extremities: trace edema Skin: No rashes, lesions or ulcers    Data Reviewed: I have personally reviewed following labs and imaging studies  CBC:  Recent Labs Lab 05/27/16 2205 05/27/16 2222 05/27/16 2351 05/28/16 0450 05/29/16 0135  WBC 12.8*  --   --  13.6* 12.2*  NEUTROABS 9.2*  --   --  9.4* 8.2*  HGB 12.2 13.6 10.9* 10.9* 9.3*  HCT 41.5 40.0 32.0* 36.9 30.9*  MCV 97.6  --   --  96.9 94.2  PLT 169  --   --  137* 131*   Basic Metabolic Panel:  Recent  Labs Lab 05/27/16 2212 05/27/16 2222 05/27/16 2351 05/28/16 0450 05/28/16 1238 05/29/16 0135  NA 159* 164* 165* 157* 156* 147*  K 3.4* 3.2* 3.3* 3.4* 3.9 3.4*  CL 120* 121* 120* 120* 122* 114*  CO2 28  --   --  30 28 24   GLUCOSE 366* 378* 317* 150* 189* 263*  BUN 47* 51* 49* 41* 41* 42*  CREATININE 1.39* 1.40* 1.40* 1.30* 1.23* 1.00  CALCIUM 12.5*  --   --  11.1* 11.2* 10.0   GFR: Estimated Creatinine Clearance: 38.3 mL/min (by C-G formula based on SCr of 1 mg/dL). Liver Function Tests:  Recent Labs Lab 05/27/16 2212 05/28/16 0450  AST 48* 43*  ALT 69* 54  ALKPHOS 142* 114  BILITOT 0.4 1.1  PROT 7.9 6.9  ALBUMIN 2.6* 2.3*   No results for  input(s): LIPASE, AMYLASE in the last 168 hours. No results for input(s): AMMONIA in the last 168 hours. Coagulation Profile:  Recent Labs Lab 05/28/16 0450  INR 1.22   Cardiac Enzymes: No results for input(s): CKTOTAL, CKMB, CKMBINDEX, TROPONINI in the last 168 hours. BNP (last 3 results) No results for input(s): PROBNP in the last 8760 hours. HbA1C: No results for input(s): HGBA1C in the last 72 hours. CBG:  Recent Labs Lab 05/28/16 0820 05/28/16 1704 05/29/16 0030 05/29/16 0405 05/29/16 0611  GLUCAP 170* 190* 236* 186* 151*   Lipid Profile: No results for input(s): CHOL, HDL, LDLCALC, TRIG, CHOLHDL, LDLDIRECT in the last 72 hours. Thyroid Function Tests: No results for input(s): TSH, T4TOTAL, FREET4, T3FREE, THYROIDAB in the last 72 hours. Anemia Panel: No results for input(s): VITAMINB12, FOLATE, FERRITIN, TIBC, IRON, RETICCTPCT in the last 72 hours. Sepsis Labs:  Recent Labs Lab 05/27/16 2222 05/28/16 0035  LATICACIDVEN 2.62* 2.32*    Recent Results (from the past 240 hour(s))  Blood Culture (routine x 2)     Status: None (Preliminary result)   Collection Time: 05/27/16 10:11 PM  Result Value Ref Range Status   Specimen Description BLOOD RIGHT WRIST  Final   Special Requests   Final    BOTTLES DRAWN AEROBIC AND ANAEROBIC 5CC BLUE 4CC RED   Culture NO GROWTH < 12 HOURS  Final   Report Status PENDING  Incomplete  Blood Culture (routine x 2)     Status: None (Preliminary result)   Collection Time: 05/27/16 10:37 PM  Result Value Ref Range Status   Specimen Description BLOOD RIGHT HAND  Final   Special Requests IN PEDIATRIC BOTTLE 3CC  Final   Culture NO GROWTH < 12 HOURS  Final   Report Status PENDING  Incomplete  MRSA PCR Screening     Status: Abnormal   Collection Time: 05/28/16  6:42 AM  Result Value Ref Range Status   MRSA by PCR POSITIVE (A) NEGATIVE Final    Comment:        The GeneXpert MRSA Assay (FDA approved for NASAL specimens only), is one  component of a comprehensive MRSA colonization surveillance program. It is not intended to diagnose MRSA infection nor to guide or monitor treatment for MRSA infections. RESULT CALLED TO, READ BACK BY AND VERIFIED WITH: Mikeal Hawthorne. MOORE [phone] 02.12.2018 N MORRIS          Radiology Studies: Dg Chest Portable 1 View  Result Date: 05/27/2016 CLINICAL DATA:  Altered mental status EXAM: PORTABLE CHEST 1 VIEW COMPARISON:  03/10/2016 FINDINGS: Cardiac shadow is within normal limits. Previously seen endotracheal tube and nasogastric catheter have been removed. Patchy left basilar  infiltrate is seen. The right lung is clear. IMPRESSION: Patchy left basilar infiltrate. Followup PA and lateral chest X-ray is recommended in 3-4 weeks following trial of antibiotic therapy to ensure resolution and exclude underlying malignancy. Electronically Signed   By: Alcide Clever M.D.   On: 05/27/2016 22:23        Scheduled Meds: . ceFEPime (MAXIPIME) IV  1 g Intravenous Q24H  . chlorhexidine  15 mL Mouth Rinse BID  . Chlorhexidine Gluconate Cloth  6 each Topical Q0600  . enoxaparin (LOVENOX) injection  30 mg Subcutaneous Q24H  . feeding supplement (PRO-STAT SUGAR FREE 64)  30 mL Per Tube Daily  . free water  200 mL Per Tube Q8H  . insulin aspart  0-9 Units Subcutaneous Q6H  . insulin glargine  10 Units Subcutaneous QHS  . mouth rinse  15 mL Mouth Rinse q12n4p  . mupirocin ointment  1 application Nasal BID  . sodium chloride flush  3 mL Intravenous Q12H  . vancomycin  500 mg Intravenous Q24H   Continuous Infusions: . sodium chloride 150 mL/hr at 05/29/16 0723  . feeding supplement (GLUCERNA 1.2 CAL) 1,000 mL (05/29/16 0700)     LOS: 2 days    Time spent: 35 minutes.     Alba Cory, MD [facility] Pager [phone]  If 7PM-7AM, please contact night-coverage [email or web address] Password [facility] 05/29/2016, 8:17 AM

## 2016-05-30 ENCOUNTER — Inpatient Hospital Stay (HOSPITAL_COMMUNITY): Payer: Medicare Other

## 2016-05-30 DIAGNOSIS — R609 Edema, unspecified: Secondary | ICD-10-CM

## 2016-05-30 DIAGNOSIS — F0390 Unspecified dementia without behavioral disturbance: Secondary | ICD-10-CM

## 2016-05-30 DIAGNOSIS — J189 Pneumonia, unspecified organism: Secondary | ICD-10-CM

## 2016-05-30 DIAGNOSIS — Z7189 Other specified counseling: Secondary | ICD-10-CM

## 2016-05-30 LAB — CULTURE, BLOOD (ROUTINE X 2)

## 2016-05-30 LAB — CBC WITH DIFFERENTIAL/PLATELET
Basophils Absolute: 0 10*3/uL (ref 0.0–0.1)
Basophils Relative: 0 %
EOS ABS: 0.4 10*3/uL (ref 0.0–0.7)
EOS PCT: 5 %
HCT: 31.1 % — ABNORMAL LOW (ref 36.0–46.0)
HEMOGLOBIN: 9.7 g/dL — AB (ref 12.0–15.0)
LYMPHS ABS: 2.5 10*3/uL (ref 0.7–4.0)
Lymphocytes Relative: 28 %
MCH: 28.7 pg (ref 26.0–34.0)
MCHC: 31.2 g/dL (ref 30.0–36.0)
MCV: 92 fL (ref 78.0–100.0)
MONO ABS: 0.4 10*3/uL (ref 0.1–1.0)
MONOS PCT: 5 %
Neutro Abs: 5.4 10*3/uL (ref 1.7–7.7)
Neutrophils Relative %: 62 %
PLATELETS: 131 10*3/uL — AB (ref 150–400)
RBC: 3.38 MIL/uL — ABNORMAL LOW (ref 3.87–5.11)
RDW: 17.4 % — AB (ref 11.5–15.5)
WBC: 8.8 10*3/uL (ref 4.0–10.5)

## 2016-05-30 LAB — BASIC METABOLIC PANEL
Anion gap: 6 (ref 5–15)
BUN: 30 mg/dL — AB (ref 6–20)
CHLORIDE: 113 mmol/L — AB (ref 101–111)
CO2: 22 mmol/L (ref 22–32)
Calcium: 9.7 mg/dL (ref 8.9–10.3)
Creatinine, Ser: 0.81 mg/dL (ref 0.44–1.00)
GFR calc Af Amer: >60 mL/min (ref >60)
Glucose, Bld: 165 mg/dL — ABNORMAL HIGH (ref 65–99)
POTASSIUM: 3.3 mmol/L — AB (ref 3.5–5.1)
Sodium: 141 mmol/L (ref 135–145)

## 2016-05-30 LAB — GLUCOSE, CAPILLARY
GLUCOSE-CAPILLARY: 162 mg/dL — AB (ref 65–99)
Glucose-Capillary: 127 mg/dL — ABNORMAL HIGH (ref 65–99)
Glucose-Capillary: 157 mg/dL — ABNORMAL HIGH (ref 65–99)
Glucose-Capillary: 158 mg/dL — ABNORMAL HIGH (ref 65–99)
Glucose-Capillary: 199 mg/dL — ABNORMAL HIGH (ref 65–99)

## 2016-05-30 MED ORDER — VERAPAMIL HCL 40 MG PO TABS
40.0000 mg | ORAL_TABLET | Freq: Three times a day (TID) | ORAL | Status: DC
  Administered 2016-05-30 – 2016-06-01 (×8): 40 mg
  Filled 2016-05-30 (×10): qty 1

## 2016-05-30 MED ORDER — ENOXAPARIN SODIUM 60 MG/0.6ML ~~LOC~~ SOLN
50.0000 mg | Freq: Two times a day (BID) | SUBCUTANEOUS | Status: DC
  Administered 2016-05-30 – 2016-06-01 (×4): 50 mg via SUBCUTANEOUS
  Filled 2016-05-30 (×4): qty 0.6

## 2016-05-30 NOTE — Progress Notes (Signed)
ANTICOAGULATION CONSULT NOTE - Initial Consult  Pharmacy Consult for Lovenox Indication: DVT  Allergies  Allergen Reactions  . Codeine Rash and Other (See Comments)     angioedema  . Neomycin Itching  . Penicillins Itching and Rash       . Tetanus Toxoids Rash  . Diphtheria Toxoid-Containing Vaccines Other (See Comments)  . Levofloxacin Rash    Patient Measurements: Height: 5\' 2"  (157.5 cm) Weight: 110 lb (49.9 kg) IBW/kg (Calculated) : 50.1  Vital Signs: Temp: 99.5 F (37.5 C) (02/14 1201) Temp Source: Axillary (02/14 1201) BP: 127/73 (02/14 1200) Pulse Rate: 86 (02/14 1200)  Labs:  Recent Labs  05/28/16 0450 05/28/16 1238 05/29/16 0135 05/30/16 0233 05/30/16 0911  HGB 10.9*  --  9.3* 9.7*  --   HCT 36.9  --  30.9* 31.1*  --   PLT 137*  --  131* 131*  --   APTT 25  --   --   --   --   LABPROT 15.5*  --   --   --   --   INR 1.22  --   --   --   --   CREATININE 1.30* 1.23* 1.00  --  0.81    Estimated Creatinine Clearance: 47.3 mL/min (by C-G formula based on SCr of 0.81 mg/dL).   Medical History: Past Medical History:  Diagnosis Date  . Altered mental status 12/09/2012  . Arthritis    "all over" (12/09/2012)  . Chronic lower back pain   . COPD (chronic obstructive pulmonary disease) (HCC)   . Diabetes mellitus without complication (HCC)   . Exertional shortness of breath   . GERD (gastroesophageal reflux disease)    rolaids if needed  . Gout, unspecified   . Hepatitis, unspecified    "the one that's not bad" (12/09/2012)  . History of blood transfusion    "related to a surgery, I think" (12/09/2012)  . Hyperlipidemia   . Hypertension   . Migraines    "in the past" (12/09/2012)  . Osteoarthrosis, unspecified whether generalized or localized, unspecified site   . Other chronic sinusitis   . Pneumonia    "couple times; long time ago" (12/09/2012)  . Unspecified chronic bronchitis    sees Dr. Melba Coon. Young, last visit 08/2011, treated for E. Coli- resp. /w  Ceftin, Feb. 2013    Assessment: 76 year old female to begin Lovenox treatment for new DVT Scr stable  Goal of Therapy:  Anti-Xa level 0.6-1 units/ml 4hrs after LMWH dose given Monitor platelets by anticoagulation protocol: Yes   Plan:  Lovenox 50 mg sq Q 12 hours Follow CBC  Thank you Okey RegalLisa Thierry Dobosz, PharmD [phone]  05/30/2016,3:56 PM

## 2016-05-30 NOTE — Progress Notes (Signed)
**  Preliminary report by tech**  Bilateral lower extremity venous duplex complete. There is evidence of age-indeterminate deep vein thrombosis involving the femoral, profunda femoral, popliteal, posterior tibial, and peroneal veins of the left lower extremity. There is no evidence of superficial vein thrombosis involving the left lower extremity. There is no evidence of deep or superficial vein thrombosis involving the right lower extremity. There is no evidence of a Baker's cyst bilaterally. Results were given to the patient's nurse, Morghan.  05/30/16 12:10 PM Olen CordialGreg Eliah Ozawa RVT

## 2016-05-30 NOTE — Plan of Care (Signed)
Problem: Education: Goal: Knowledge of [facility] Education information/materials will improve Outcome: Not Met (add Reason) Pt. Is unresponsive, will educate family.

## 2016-05-30 NOTE — Progress Notes (Signed)
PROGRESS NOTE    Sheila Cisneros  WUJ:[id number] DOB: 1941/04/02 DOA: 05/27/2016 PCP: Sanda Lingerhomas Jones, MD   Brief Narrative: 76 y.o.femalewith medical history significant of osteoarthritis, chronic lower back pain, COPD, type 2 diabetes, GERD, COPD, hyperlipidemia, hypertension, gout, migraine headaches, multiple episodes of pneumonia who is coming to the emergency department [facility] due to hyperglycemia and hypernatremia. chest radiograph showeda left patchy basilar infiltrate.   Assessment & Plan:   Principal Problem:   Hypernatremia Active Problems:   Essential hypertension   Type II diabetes mellitus with manifestations (HCC)   Hypokalemia   COPD (chronic obstructive pulmonary disease) (HCC)   HCAP (healthcare-associated pneumonia)   Acute kidney injury (HCC)   Dementia without behavioral disturbance   Hypernatremia; related to hypovolemia.  Continue with IV fluids, free water per tube feeding.  Sodium trending down.  Awaiting sodium for the morning.   Dementia, Acute Encephalopathy ?  Unclear baseline, but non verbal per records.  CT head; no acute intracranial abnormalities, old stroke.  ammonia level 30. Palliative care consulted. Meeting this afternoon.   PNA, Health care associated.  Strep pneumonia negative.  Continue with IV antibiotics.   Blood culture, positive for GPR: one of two.  Follow culture. Continue with IV antibiotics.   HTN; resume verapamil.   Transaminases.  Trending down.   Hypokalemia;  Lab pending for the morning.    Lower extremity edema; check doppler.  Decubitus ulcer stage II; wound care consulted.   DM; SSI. Low dose lantus   DVT prophylaxis: lovenox Code Status: full code.  Family Communication: called POA this morning, he doesn't answer phone,  Disposition Plan: remain inpatient for IV fluids, IV antibiotics, palliative care consult.    Consultants:   Palliative    Procedures:    none   Antimicrobials:  Cefepime 2-12  Vancomycin 2-12   Subjective: patient is non verbal. Not following command   Objective: Vitals:   05/30/16 0300 05/30/16 0400 05/30/16 0500 05/30/16 0600  BP: 136/85 (!) 143/84 (!) 152/76 (!) 150/86  Pulse: 87 96 85 85  Resp: (!) 21 15 18 17   Temp:  99.2 F (37.3 C)    TempSrc:  Oral    SpO2: 99% 98% 99% 94%  Weight:      Height:        Intake/Output Summary (Last 24 hours) at 05/30/16 0744 Last data filed at 05/30/16 0626  Gross per 24 hour  Intake           3684.5 ml  Output                0 ml  Net           3684.5 ml   Filed Weights   05/27/16 2239  Weight: 49.9 kg (110 lb)    Examination:  General exam: lethargic.  Respiratory system: Respiratory effort normal. Bilateral ronchus Cardiovascular system: S1 & S2 heard, RRR. No JVD, murmurs, rubs, gallops or clicks.  Gastrointestinal system: Abdomen is nondistended, soft and nontender. No organomegaly or masses felt. Normal bowel sounds heard. Peg tube in place.  Central nervous system: Alert and oriented. No focal neurological deficits. Extremities: trace edema Skin: No rashes, lesions or ulcers    Data Reviewed: I have personally reviewed following labs and imaging studies  CBC:  Recent Labs Lab 05/27/16 2205 05/27/16 2222 05/27/16 2351 05/28/16 0450 05/29/16 0135 05/30/16 0233  WBC 12.8*  --   --  13.6* 12.2* 8.8  NEUTROABS 9.2*  --   --  9.4* 8.2* 5.4  HGB 12.2 13.6 10.9* 10.9* 9.3* 9.7*  HCT 41.5 40.0 32.0* 36.9 30.9* 31.1*  MCV 97.6  --   --  96.9 94.2 92.0  PLT 169  --   --  137* 131* 131*   Basic Metabolic Panel:  Recent Labs Lab 05/27/16 2212 05/27/16 2222 05/27/16 2351 05/28/16 0450 05/28/16 1238 05/29/16 0135  NA 159* 164* 165* 157* 156* 147*  K 3.4* 3.2* 3.3* 3.4* 3.9 3.4*  CL 120* 121* 120* 120* 122* 114*  CO2 28  --   --  30 28 24   GLUCOSE 366* 378* 317* 150* 189* 263*  BUN 47* 51* 49* 41* 41* 42*  CREATININE 1.39* 1.40*  1.40* 1.30* 1.23* 1.00  CALCIUM 12.5*  --   --  11.1* 11.2* 10.0   GFR: Estimated Creatinine Clearance: 38.3 mL/min (by C-G formula based on SCr of 1 mg/dL). Liver Function Tests:  Recent Labs Lab 05/27/16 2212 05/28/16 0450  AST 48* 43*  ALT 69* 54  ALKPHOS 142* 114  BILITOT 0.4 1.1  PROT 7.9 6.9  ALBUMIN 2.6* 2.3*   No results for input(s): LIPASE, AMYLASE in the last 168 hours.  Recent Labs Lab 05/29/16 1126  AMMONIA 30   Coagulation Profile:  Recent Labs Lab 05/28/16 0450  INR 1.22   Cardiac Enzymes: No results for input(s): CKTOTAL, CKMB, CKMBINDEX, TROPONINI in the last 168 hours. BNP (last 3 results) No results for input(s): PROBNP in the last 8760 hours. HbA1C: No results for input(s): HGBA1C in the last 72 hours. CBG:  Recent Labs Lab 05/29/16 0611 05/29/16 1210 05/29/16 1731 05/30/16 0005 05/30/16 0633  GLUCAP 151* 127* 108* 199* 162*   Lipid Profile: No results for input(s): CHOL, HDL, LDLCALC, TRIG, CHOLHDL, LDLDIRECT in the last 72 hours. Thyroid Function Tests: No results for input(s): TSH, T4TOTAL, FREET4, T3FREE, THYROIDAB in the last 72 hours. Anemia Panel: No results for input(s): VITAMINB12, FOLATE, FERRITIN, TIBC, IRON, RETICCTPCT in the last 72 hours. Sepsis Labs:  Recent Labs Lab 05/27/16 2222 05/28/16 0035 05/29/16 1126  LATICACIDVEN 2.62* 2.32* 1.8    Recent Results (from the past 240 hour(s))  Blood Culture (routine x 2)     Status: None (Preliminary result)   Collection Time: 05/27/16 10:11 PM  Result Value Ref Range Status   Specimen Description BLOOD RIGHT WRIST  Final   Special Requests   Final    BOTTLES DRAWN AEROBIC AND ANAEROBIC 5CC BLUE 4CC RED   Culture  Setup Time   Final    GRAM POSITIVE RODS AEROBIC BOTTLE ONLY Organism ID to follow CRITICAL RESULT CALLED TO, READ BACK BY AND VERIFIED WITH: L POWELL PHARMD [id number] 05/29/16 A BROWNING    Culture NO GROWTH 2 DAYS  Final   Report Status PENDING  Incomplete   Blood Culture ID Panel (Reflexed)     Status: None   Collection Time: 05/27/16 10:11 PM  Result Value Ref Range Status   Enterococcus species NOT DETECTED NOT DETECTED Final   Listeria monocytogenes NOT DETECTED NOT DETECTED Final   Staphylococcus species NOT DETECTED NOT DETECTED Final   Staphylococcus aureus NOT DETECTED NOT DETECTED Final   Streptococcus species NOT DETECTED NOT DETECTED Final   Streptococcus agalactiae NOT DETECTED NOT DETECTED Final   Streptococcus pneumoniae NOT DETECTED NOT DETECTED Final   Streptococcus pyogenes NOT DETECTED NOT DETECTED Final   Acinetobacter baumannii NOT DETECTED NOT DETECTED Final   Enterobacteriaceae species NOT DETECTED NOT DETECTED Final   Enterobacter cloacae complex  NOT DETECTED NOT DETECTED Final   Escherichia coli NOT DETECTED NOT DETECTED Final   Klebsiella oxytoca NOT DETECTED NOT DETECTED Final   Klebsiella pneumoniae NOT DETECTED NOT DETECTED Final   Proteus species NOT DETECTED NOT DETECTED Final   Serratia marcescens NOT DETECTED NOT DETECTED Final   Haemophilus influenzae NOT DETECTED NOT DETECTED Final   Neisseria meningitidis NOT DETECTED NOT DETECTED Final   Pseudomonas aeruginosa NOT DETECTED NOT DETECTED Final   Candida albicans NOT DETECTED NOT DETECTED Final   Candida glabrata NOT DETECTED NOT DETECTED Final   Candida krusei NOT DETECTED NOT DETECTED Final   Candida parapsilosis NOT DETECTED NOT DETECTED Final   Candida tropicalis NOT DETECTED NOT DETECTED Final  Blood Culture (routine x 2)     Status: None (Preliminary result)   Collection Time: 05/27/16 10:37 PM  Result Value Ref Range Status   Specimen Description BLOOD RIGHT HAND  Final   Special Requests IN PEDIATRIC BOTTLE 3CC  Final   Culture NO GROWTH 2 DAYS  Final   Report Status PENDING  Incomplete  MRSA PCR Screening     Status: Abnormal   Collection Time: 05/28/16  6:42 AM  Result Value Ref Range Status   MRSA by PCR POSITIVE (A) NEGATIVE Final     Comment:        The GeneXpert MRSA Assay (FDA approved for NASAL specimens only), is one component of a comprehensive MRSA colonization surveillance program. It is not intended to diagnose MRSA infection nor to guide or monitor treatment for MRSA infections. RESULT CALLED TO, READ BACK BY AND VERIFIED WITH: EChristell Constant [phone] 02.12.2018 N MORRIS          Radiology Studies: Ct Head Wo Contrast  Result Date: 05/29/2016 CLINICAL DATA:  Acute encephalopathy EXAM: CT HEAD WITHOUT CONTRAST TECHNIQUE: Contiguous axial images were obtained from the base of the skull through the vertex without intravenous contrast. COMPARISON:  02/05/2016 FINDINGS: Brain: There is atrophy and chronic small vessel disease changes. 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 No acute intracranial abnormality. Specifically, no hemorrhage, hydrocephalus, mass lesion, acute infarction, or significant intracranial injury. Vascular: No hyperdense vessel or unexpected calcification. Skull: No acute calvarial abnormality. Sinuses/Orbits: Mucosal thickening in the paranasal sinuses. No air-fluid levels. Mastoid air cells are clear. Orbital soft tissues unremarkable. Other: None IMPRESSION: No acute intracranial abnormality. Atrophy, chronic microvascular disease. 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 Electronically Signed   By: Charlett Nose M.D.   On: 05/29/2016 09:46        Scheduled Meds: . ceFEPime (MAXIPIME) IV  1 g Intravenous Q24H  . chlorhexidine  15 mL Mouth Rinse BID  . Chlorhexidine Gluconate Cloth  6 each Topical Q0600  . enoxaparin (LOVENOX) injection  30 mg Subcutaneous Q24H  . feeding supplement (PRO-STAT SUGAR FREE 64)  30 mL Per Tube Daily  . free water  250 mL Per Tube Q8H  . insulin aspart  0-9 Units Subcutaneous Q6H  . insulin glargine  10 Units Subcutaneous QHS  . mouth rinse  15 mL Mouth Rinse q12n4p  . mupirocin ointment  1 application Nasal BID  . sodium chloride  flush  3 mL Intravenous Q12H  . vancomycin  750 mg Intravenous Q24H   Continuous Infusions: . sodium chloride 100 mL/hr at 05/29/16 2000  . feeding supplement (GLUCERNA 1.2 CAL) 1,000 mL (05/29/16 2000)     LOS: 3 days    Time spent: 35 minutes.     Dorann Davidson, [employer]  A, MD [facility] Pager [phone]  If 7PM-7AM, please contact night-coverage [email or web address] Password [facility] 05/30/2016, 7:44 AM

## 2016-05-30 NOTE — Consult Note (Signed)
Consultation Note Date: 05/30/2016   Patient Name: Sheila Cisneros  DOB: Jul 22, 1940  MRN: [id number]  Age / Sex: 76 y.o., female  PCP: Janith Lima, MD Referring Physician: Elmarie Shiley, MD  Reason for Consultation: Establishing goals of care  HPI/Patient Profile: 76 y.o. female  with past medical history of dementia, osteoarthritis, chronic lower back pain, COPD, DM2, GERD, HLD, HTN, gout, migraines, and recurrent pneumonia from aspiration. Of note, 11/20-12/5/17 she was admitted for acute respiratory failure from HCAP with requirement of intubation; this was felt to be secondary to aspiration from dysphagia. Palliative was involved and had an extensive discussion with HCPOA, [name]. Despite our recommendations against PEG placement, he did want one placed and wanted to continue to allow comfort feeds. This admission, she presented to the ED from [facility] [place] home with hyperglycemia and hypernatremia and was subsequently admitted on 05/27/2016. On admission CXR with patchy left basilar infiltrate, and she is subsequently being treated for pneumonia. Labs have also improved with increased hydration. Palliative consulted to discuss goals of care with HCPOA.  Clinical Assessment and Goals of Care: Sheila Cisneros is unable to participate in goals of care due to dementia. I met with her nephew, Sheila Cisneros, who is her HCPOA. Sheila Cisneros was able to acknowledge that Sheila Cisneros has had a progressive decline over the past few months. He understands she is aspirating her own secretions, and that these are causing her recurrent infections. He is also aware that she is not going to improve back to where she was even a few months ago. We talked at length about the progressive nature of dementia, the signs that she was declining, and the effect recurrent infections has on her health trajectory. While he was able to hear these things, he continues to struggle with changing her current level  of care.   In October 2017 he had a long conversation with her about her health. They talked about code status and what she wanted for her life (even addressing the potential of being non-communicative and bed-bound). She clearly verbalized a desire for any and all interventions to prolong her life, and did not care if she had to be on life-support machines. That said, she also expressed that her goal was to be at home and to die at home. Sheila Cisneros is struggling to respect these wishes, as he knows she would not have wanted to live as she is now for a prolonged period.   As we talked through this I encouraged him to consider her last sentiment, the desire to be and die at home. He is currently working to obtain [employer] in order to be in a financial position to have her at home with full time support. When he is able to achieve this, he plans to transition her there. At that point I encouraged him to consider Hospice support. I emphasized that they will provide symptom management and support so that she can remain comfortable at home, and reinforced that her underlying aspiration issues were not-fixable and she had not wanted to spend her time at the hospital. He agreed with this plan. In the meantime, however, he wants to continue Full Code with Full Scope interventions in order to prolong her life.   Primary Decision [employer], nephew Sheila Cisneros.    SUMMARY OF RECOMMENDATIONS    Full code, full scope interventions  When ready for discharge, plan to d/c back to [facility] with [place] will plan to transition her home with  Hospice from [place] once he is financially able  Code Status/Advance Care Planning:  Full code  Palliative Prophylaxis:   Aspiration, Bowel Regimen, Frequent Pain Assessment, Oral Care and Turn Reposition  Additional Recommendations (Limitations, Scope, Preferences):  Full Scope Treatment  Psycho-social/Spiritual:   Desire for further  Chaplaincy support:no  Additional Recommendations: Education on Hospice  Prognosis:   Unable to determine  Discharge Planning: SNF with Palliative Care      Primary Diagnoses: Present on Admission: . Hypernatremia . Acute kidney injury ([place]) . Dementia without behavioral disturbance . COPD (chronic obstructive pulmonary disease) ([place]) . Type II diabetes mellitus with manifestations ([place]) . Essential hypertension . Hypokalemia . HCAP (healthcare-associated pneumonia)   I have reviewed the medical record, interviewed the patient and family, and examined the patient. The following aspects are pertinent.  Past Medical History:  Diagnosis Date  . Altered mental status 12/09/2012  . Arthritis    "all over" (12/09/2012)  . Chronic lower back pain   . COPD (chronic obstructive pulmonary disease) ([place])   . Diabetes mellitus without complication ([place])   . Exertional shortness of breath   . GERD (gastroesophageal reflux disease)    rolaids if needed  . Gout, unspecified   . Hepatitis, unspecified    "the one that's not bad" (12/09/2012)  . History of blood transfusion    "related to a surgery, I think" (12/09/2012)  . Hyperlipidemia   . Hypertension   . Migraines    "in the past" (12/09/2012)  . Osteoarthrosis, unspecified whether generalized or localized, unspecified site   . Other chronic sinusitis   . Pneumonia    "couple times; long time ago" (12/09/2012)  . Unspecified chronic bronchitis    sees Dr. Glynn Octave, last visit 08/2011, treated for E. Coli- resp. /w Ceftin, Feb. 2013   Social History   Social History  . Marital status: Divorced    Spouse name: N/A  . Number of children: N/A  . Years of education: N/A   Social History Main Topics  . Smoking status: Never Smoker  . Smokeless tobacco: Never Used  . Alcohol use No     Comment: 12/09/2012 "have a taste yearly"  . Drug use: No  . Sexual activity: Not Currently   Other Topics Concern  . None   Social  History Narrative   Pt exercises approx 4-5 days a week   Family History  Problem Relation Age of Onset  . Heart disease Father   . ALS Brother    Scheduled Meds: . ceFEPime (MAXIPIME) IV  1 g Intravenous Q24H  . chlorhexidine  15 mL Mouth Rinse BID  . Chlorhexidine Gluconate Cloth  6 each Topical Q0600  . enoxaparin (LOVENOX) injection  30 mg Subcutaneous Q24H  . feeding supplement (PRO-STAT SUGAR FREE 64)  30 mL Per Tube Daily  . free water  250 mL Per Tube Q8H  . insulin aspart  0-9 Units Subcutaneous Q6H  . insulin glargine  10 Units Subcutaneous QHS  . mouth rinse  15 mL Mouth Rinse q12n4p  . mupirocin ointment  1 application Nasal BID  . sodium chloride flush  3 mL Intravenous Q12H  . vancomycin  750 mg Intravenous Q24H   Continuous Infusions: . sodium chloride 100 mL/hr at 05/29/16 2000  . feeding supplement (GLUCERNA 1.2 CAL) 1,000 mL (05/29/16 2000)   PRN Meds:.ipratropium-albuterol Allergies  Allergen Reactions  . Codeine Rash and Other (See Comments)     angioedema  . Neomycin Itching  .  Penicillins Itching and Rash       . Tetanus Toxoids Rash  . Diphtheria Toxoid-Containing Vaccines Other (See Comments)  . Levofloxacin Rash   Review of Systems: Unable to complete, pt not verbally responding.  Physical Exam  Constitutional: Vital signs are normal. She appears lethargic. She has a sickly appearance.  HENT:  Head: Normocephalic and atraumatic.  Mouth/Throat: No oropharyngeal exudate.  Eyes:  Pt's eyes shut, did not open them despite encouragement  Neck: Normal range of motion.  Cardiovascular: Normal rate.   Pulmonary/Chest: Effort normal. No accessory muscle usage. She has rhonchi (mild throughout).  Abdominal: Soft. Bowel sounds are normal. She exhibits no distension.  G tube present, site benign  Musculoskeletal: She exhibits edema (BLE, non-pitting in BUE).  Neurological: She appears lethargic.  Will moan and say unclear words to verbal stimuli. Did  not answer any questions for follow any commands.  Skin: Skin is warm and dry.  Psychiatric:  Appears calm and comfortable in bed.   Vital Signs: BP (!) 150/86   Pulse 85   Temp 99.2 F (37.3 C) (Oral)   Resp 17   Ht 5' 2"  (1.575 m)   Wt 49.9 kg (110 lb)   SpO2 94%   BMI 20.12 kg/m  Pain Assessment: CPOT     SpO2: SpO2: 94 % O2 Device:SpO2: 94 % O2 Flow Rate: .O2 Flow Rate (L/min): 2 L/min  IO: Intake/output summary:  Intake/Output Summary (Last 24 hours) at 05/30/16 0741 Last data filed at 05/30/16 5038  Gross per 24 hour  Intake           3684.5 ml  Output                0 ml  Net           3684.5 ml    LBM: Last BM Date: 05/29/16 Baseline Weight: Weight: 49.9 kg (110 lb) Most recent weight: Weight: 49.9 kg (110 lb)     Palliative Assessment/Data: PPS 10-20%   Flowsheet Rows   Flowsheet Row Most Recent Value  Intake Tab  Referral Department  Hospitalist  Unit at Time of Referral  Med/Surg Unit  Palliative Care Primary Diagnosis  Neurology  Date Notified  05/28/16  Palliative Care Type  New Palliative care  Reason for referral  Clarify Goals of Care  Date of Admission  05/27/16  # of days IP prior to Palliative referral  1  Clinical Assessment  Psychosocial & Spiritual Assessment  Palliative Care Outcomes     Time in/out: 8828-0034; 1430-1550   Time Total: 105 minutes Greater than 50%  of this time was spent counseling and coordinating care related to the above assessment and plan.  Signed by: Charlynn Court, NP Palliative Medicine Team Pager # [phone] (M-F 7a-5p) Team Phone # [phone] (Nights/Weekends)

## 2016-05-30 NOTE — Progress Notes (Addendum)
Physician notified: Regalado At: 1546  Regarding: Please review initial Doppler results, LLE DVT. Na 141, cont 1/2 NS? POA had mtg with pall care, unsure of plan as of now--no notes.   Place order for pharmacy to dose lovenox for DVT treatment. This RN called pharmacy.

## 2016-05-31 DIAGNOSIS — G9340 Encephalopathy, unspecified: Secondary | ICD-10-CM

## 2016-05-31 DIAGNOSIS — Z515 Encounter for palliative care: Secondary | ICD-10-CM

## 2016-05-31 LAB — BASIC METABOLIC PANEL
Anion gap: 8 (ref 5–15)
BUN: 26 mg/dL — AB (ref 6–20)
CALCIUM: 10 mg/dL (ref 8.9–10.3)
CO2: 21 mmol/L — ABNORMAL LOW (ref 22–32)
Chloride: 112 mmol/L — ABNORMAL HIGH (ref 101–111)
Creatinine, Ser: 0.74 mg/dL (ref 0.44–1.00)
GFR calc non Af Amer: >60 mL/min (ref >60)
Glucose, Bld: 155 mg/dL — ABNORMAL HIGH (ref 65–99)
Potassium: 3.3 mmol/L — ABNORMAL LOW (ref 3.5–5.1)
SODIUM: 141 mmol/L (ref 135–145)

## 2016-05-31 LAB — CBC
HCT: 31 % — ABNORMAL LOW (ref 36.0–46.0)
Hemoglobin: 10 g/dL — ABNORMAL LOW (ref 12.0–15.0)
MCH: 29 pg (ref 26.0–34.0)
MCHC: 32.3 g/dL (ref 30.0–36.0)
MCV: 89.9 fL (ref 78.0–100.0)
Platelets: 152 10*3/uL (ref 150–400)
RBC: 3.45 MIL/uL — AB (ref 3.87–5.11)
RDW: 17.3 % — AB (ref 11.5–15.5)
WBC: 10.2 10*3/uL (ref 4.0–10.5)

## 2016-05-31 LAB — GLUCOSE, CAPILLARY
GLUCOSE-CAPILLARY: 127 mg/dL — AB (ref 65–99)
Glucose-Capillary: 148 mg/dL — ABNORMAL HIGH (ref 65–99)
Glucose-Capillary: 141 mg/dL — ABNORMAL HIGH (ref 65–99)

## 2016-05-31 MED ORDER — POTASSIUM CHLORIDE 20 MEQ/15ML (10%) PO SOLN
40.0000 meq | Freq: Once | ORAL | Status: AC
  Administered 2016-05-31: 40 meq
  Filled 2016-05-31: qty 30

## 2016-05-31 MED ORDER — FREE WATER
250.0000 mL | Freq: Four times a day (QID) | Status: DC
  Administered 2016-05-31 – 2016-06-01 (×4): 250 mL

## 2016-05-31 NOTE — Progress Notes (Signed)
PROGRESS NOTE    Sheila Cisneros  ZOX:[id number] DOB: [birth year] DOA: 05/27/2016 PCP: Sanda Linger, MD   Brief Narrative: 76 y.o.femalewith medical history significant of osteoarthritis, chronic lower back pain, COPD, type 2 diabetes, GERD, COPD, hyperlipidemia, hypertension, gout, migraine headaches, multiple episodes of pneumonia who is coming to the emergency department via EMS from [facility] due to hyperglycemia and hypernatremia. chest radiograph showeda left patchy basilar infiltrate.   Assessment & Plan:   Principal Problem:   Hypernatremia Active Problems:   Essential hypertension   Type II diabetes mellitus with manifestations (HCC)   Hypokalemia   COPD (chronic obstructive pulmonary disease) (H[facility])   HCAP (healthcare-associated pneumonia)   Acute kidney injury (H[facility])   Dementia without behavioral disturbance   Palliative care by specialist   Hypernatremia; related to hypovolemia.  Continue with IV fluids, free water per tube feeding.  Resolved. NSL. Continue with free water.   Dementia, Acute Encephalopathy ?  Unclear baseline, but non verbal per records.  CT head; no acute intracranial abnormalities, old stroke.  ammonia level 30. Palliative care consulted. Meeting this afternoon.   PNA, Health care associated.  Strep pneumonia negative.  Continue with IV antibiotics. Day 3.   Blood culture, positive for GPR: one of two. Dyphteroids. Likely contaminant.    HTN; resume verapamil.   Transaminases.  Trending down.   Hypokalemia;  Replaced.   Lower extremity edema; DVT age indeterminate involving femoral, profunda, popliteal and posterior tibial/  Continue with Lovenox.  Try to contact family to discussed options. For oral anticoagulation.   Decubitus ulcer stage II; wound care consulted.   DM; SSI. Low dose lantus   DVT prophylaxis: lovenox Code Status: full code.  Family Communication: called POA this morning, he doesn't answer phone,    Disposition Plan: discharge in 24 hours if vitals stable. tranfers to [facility].    Consultants:   Palliative    Procedures:   none   Antimicrobials:  Cefepime 2-12  Vancomycin 2-12   Subjective: patient is non verbal. Not following command Oxygen 98 RA  Objective: Vitals:   05/31/16 0500 05/31/16 0600 05/31/16 0757 05/31/16 0800  BP:   (!) 142/76 133/70  Pulse: (!) 103 100 92 88  Resp: (!) 23 (!) 25  (!) 22  Temp:   99.2 F (37.3 C)   TempSrc:   Axillary   SpO2: 97% 95% 98% (!) 84%  Weight:      Height:        Intake/Output Summary (Last 24 hours) at 05/31/16 1127 Last data filed at 05/31/16 0600  Gross per 24 hour  Intake          3378.08 ml  Output                0 ml  Net          3378.08 ml   Filed Weights   05/27/16 2239  Weight: 49.9 kg (110 lb)    Examination:  General exam: eyes open, non verbal.  Respiratory system: Respiratory effort normal. Bilateral ronchus Cardiovascular system: S1 & S2 heard, RRR. No JVD, murmurs, rubs, gallops or clicks.  Gastrointestinal system: Abdomen is nondistended, soft and nontender. No organomegaly or masses felt. Normal bowel sounds heard. Peg tube in place.  Extremities: trace edema Skin: No rashes, lesions or ulcers    Data Reviewed: I have personally reviewed following labs and imaging studies  CBC:  Recent Labs Lab 05/27/16 2205  05/27/16 2351 05/28/16 0450 05/29/16 0135 05/30/16 8119  05/31/16 0222  WBC 12.8*  --   --  13.6* 12.2* 8.8 10.2  NEUTROABS 9.2*  --   --  9.4* 8.2* 5.4  --   HGB 12.2  < > 10.9* 10.9* 9.3* 9.7* 10.0*  HCT 41.5  < > 32.0* 36.9 30.9* 31.1* 31.0*  MCV 97.6  --   --  96.9 94.2 92.0 89.9  PLT 169  --   --  137* 131* 131* 152  < > = values in this interval not displayed. Basic Metabolic Panel:  Recent Labs Lab 05/28/16 0450 05/28/16 1238 05/29/16 0135 05/30/16 0911 05/31/16 0222  NA 157* 156* 147* 141 141  K 3.4* 3.9 3.4* 3.3* 3.3*  CL 120* 122* 114* 113* 112*   CO2 30 28 24 22  21*  GLUCOSE 150* 189* 263* 165* 155*  BUN 41* 41* 42* 30* 26*  CREATININE 1.30* 1.23* 1.00 0.81 0.74  CALCIUM 11.1* 11.2* 10.0 9.7 10.0   GFR: Estimated Creatinine Clearance: 47.9 mL/min (by C-G formula based on SCr of 0.74 mg/dL). Liver Function Tests:  Recent Labs Lab 05/27/16 2212 05/28/16 0450  AST 48* 43*  ALT 69* 54  ALKPHOS 142* 114  BILITOT 0.4 1.1  PROT 7.9 6.9  ALBUMIN 2.6* 2.3*   No results for input(s): LIPASE, AMYLASE in the last 168 hours.  Recent Labs Lab 05/29/16 1126  AMMONIA 30   Coagulation Profile:  Recent Labs Lab 05/28/16 0450  INR 1.22   Cardiac Enzymes: No results for input(s): CKTOTAL, CKMB, CKMBINDEX, TROPONINI in the last 168 hours. BNP (last 3 results) No results for input(s): PROBNP in the last 8760 hours. HbA1C: No results for input(s): HGBA1C in the last 72 hours. CBG:  Recent Labs Lab 05/30/16 0633 05/30/16 1158 05/30/16 1804 05/30/16 2357 05/31/16 0523  GLUCAP 162* 127* 158* 157* 127*   Lipid Profile: No results for input(s): CHOL, HDL, LDLCALC, TRIG, CHOLHDL, LDLDIRECT in the last 72 hours. Thyroid Function Tests: No results for input(s): TSH, T4TOTAL, FREET4, T3FREE, THYROIDAB in the last 72 hours. Anemia Panel: No results for input(s): VITAMINB12, FOLATE, FERRITIN, TIBC, IRON, RETICCTPCT in the last 72 hours. Sepsis Labs:  Recent Labs Lab 05/27/16 2222 05/28/16 0035 05/29/16 1126  LATICACIDVEN 2.62* 2.32* 1.8    Recent Results (from the past 240 hour(s))  Blood Culture (routine x 2)     Status: Abnormal   Collection Time: 05/27/16 10:11 PM  Result Value Ref Range Status   Specimen Description BLOOD RIGHT WRIST  Final   Special Requests   Final    BOTTLES DRAWN AEROBIC AND ANAEROBIC 5CC BLUE 4CC RED   Culture  Setup Time   Final    GRAM POSITIVE RODS AEROBIC BOTTLE ONLY Organism ID to follow CRITICAL RESULT CALLED TO, READ BACK BY AND VERIFIED WITH: L POWELL PHARMD 2024 05/29/16 A  BROWNING    Culture (A)  Final    DIPHTHEROIDS(CORYNEBACTERIUM SPECIES) Standardized susceptibility testing for this organism is not available.    Report Status 05/30/2016 FINAL  Final  Blood Culture ID Panel (Reflexed)     Status: None   Collection Time: 05/27/16 10:11 PM  Result Value Ref Range Status   Enterococcus species NOT DETECTED NOT DETECTED Final   Listeria monocytogenes NOT DETECTED NOT DETECTED Final   Staphylococcus species NOT DETECTED NOT DETECTED Final   Staphylococcus aureus NOT DETECTED NOT DETECTED Final   Streptococcus species NOT DETECTED NOT DETECTED Final   Streptococcus agalactiae NOT DETECTED NOT DETECTED Final   Streptococcus pneumoniae NOT  DETECTED NOT DETECTED Final   Streptococcus pyogenes NOT DETECTED NOT DETECTED Final   Acinetobacter baumannii NOT DETECTED NOT DETECTED Final   Enterobacteriaceae species NOT DETECTED NOT DETECTED Final   Enterobacter cloacae complex NOT DETECTED NOT DETECTED Final   Escherichia coli NOT DETECTED NOT DETECTED Final   Klebsiella oxytoca NOT DETECTED NOT DETECTED Final   Klebsiella pneumoniae NOT DETECTED NOT DETECTED Final   Proteus species NOT DETECTED NOT DETECTED Final   Serratia marcescens NOT DETECTED NOT DETECTED Final   Haemophilus influenzae NOT DETECTED NOT DETECTED Final   Neisseria meningitidis NOT DETECTED NOT DETECTED Final   Pseudomonas aeruginosa NOT DETECTED NOT DETECTED Final   Candida albicans NOT DETECTED NOT DETECTED Final   Candida glabrata NOT DETECTED NOT DETECTED Final   Candida krusei NOT DETECTED NOT DETECTED Final   Candida parapsilosis NOT DETECTED NOT DETECTED Final   Candida tropicalis NOT DETECTED NOT DETECTED Final  Blood Culture (routine x 2)     Status: None (Preliminary result)   Collection Time: 05/27/16 10:37 PM  Result Value Ref Range Status   Specimen Description BLOOD RIGHT HAND  Final   Special Requests IN PEDIATRIC BOTTLE 3CC  Final   Culture NO GROWTH 3 DAYS  Final    Report Status PENDING  Incomplete  MRSA PCR Screening     Status: Abnormal   Collection Time: 05/28/16  6:42 AM  Result Value Ref Range Status   MRSA by PCR POSITIVE (A) NEGATIVE Final    Comment:        The GeneXpert MRSA Assay (FDA approved for NASAL specimens only), is one component of a comprehensive MRSA colonization surveillance program. It is not intended to diagnose MRSA infection nor to guide or monitor treatment for MRSA infections. RESULT CALLED TO, READ BACK BY AND VERIFIED WITH: EChristell Constant [phone] 02.12.2018 N MORRIS          Radiology Studies: No results found.      Scheduled Meds: . ceFEPime (MAXIPIME) IV  1 g Intravenous Q24H  . chlorhexidine  15 mL Mouth Rinse BID  . Chlorhexidine Gluconate Cloth  6 each Topical Q0600  . enoxaparin (LOVENOX) injection  50 mg Subcutaneous Q12H  . feeding supplement (PRO-STAT SUGAR FREE 64)  30 mL Per Tube Daily  . free water  250 mL Per Tube Q8H  . insulin aspart  0-9 Units Subcutaneous Q6H  . insulin glargine  10 Units Subcutaneous QHS  . mouth rinse  15 mL Mouth Rinse q12n4p  . mupirocin ointment  1 application Nasal BID  . sodium chloride flush  3 mL Intravenous Q12H  . vancomycin  750 mg Intravenous Q24H  . verapamil  40 mg Per Tube Q8H   Continuous Infusions: . feeding supplement (GLUCERNA 1.2 CAL) 1,000 mL (05/30/16 1630)     LOS: 4 days    Time spent: 35 minutes.     Alba Cory, MD [facility] Pager [phone]  If 7PM-7AM, please contact night-coverage [email or web address] Password [facility] 05/31/2016, 11:27 AM

## 2016-05-31 NOTE — Progress Notes (Signed)
Daily Progress Note   Patient Name: Sheila Cisneros       Date: 05/31/2016 DOB: 1940/06/15  Age: 76 y.o. MRN#: [id number] Attending Physician: Elmarie Shiley, MD Primary Care Physician: Scarlette Calico, MD Admit Date: 05/27/2016  Reason for Consultation/Follow-up: Non pain symptom management and Psychosocial/spiritual support  Subjective: Sheila Cisneros is unchanged from yesterday. She will open her eyes to voice and moan in response to physical contact, but is otherwise unresponsive. She does not follow commands or consistently track me with her eyes. No family at the bedside.   Length of Stay: 4  Current Medications: Scheduled Meds:  . ceFEPime (MAXIPIME) IV  1 g Intravenous Q24H  . chlorhexidine  15 mL Mouth Rinse BID  . Chlorhexidine Gluconate Cloth  6 each Topical Q0600  . enoxaparin (LOVENOX) injection  50 mg Subcutaneous Q12H  . feeding supplement (PRO-STAT SUGAR FREE 64)  30 mL Per Tube Daily  . free water  250 mL Per Tube Q8H  . insulin aspart  0-9 Units Subcutaneous Q6H  . insulin glargine  10 Units Subcutaneous QHS  . mouth rinse  15 mL Mouth Rinse q12n4p  . mupirocin ointment  1 application Nasal BID  . sodium chloride flush  3 mL Intravenous Q12H  . vancomycin  750 mg Intravenous Q24H  . verapamil  40 mg Per Tube Q8H    Continuous Infusions: . feeding supplement (GLUCERNA 1.2 CAL) 1,000 mL (05/30/16 1630)    PRN Meds: ipratropium-albuterol  Physical Exam   Constitutional: Vital signs are normal. She appears lethargic. She has a sickly appearance.  HENT:  Head: Normocephalic and atraumatic.  Mouth/Throat: No oropharyngeal exudate.  Eyes:  Opened eyes to voice, briefly tracked me. Neck: Normal range of motion.  Cardiovascular: Normal rate.   Pulmonary/Chest: Effort normal. No accessory muscle usage. Abdominal: Soft.  She exhibits no distension.  G tube present.  Musculoskeletal: She exhibits edema (BLE, non-pitting in BUE).  Neurological: She appears lethargic.  Will moan when touched and open eyes to voice. Unresponsive otherwise.  Skin: Skin is warm and dry.  Psychiatric:  Appears calm and comfortable in bed.         Vital Signs: BP (!) 142/76 (BP Location: Right Arm)   Pulse 92   Temp 99.2 F (37.3 C) (Axillary)   Resp (!) 25   Ht 5' 2"  (1.575 m)   Wt 49.9 kg (110 lb)   SpO2 98%   BMI 20.12 kg/m  SpO2: SpO2: 98 % O2 Device: O2 Device: Not Delivered O2 Flow Rate: O2 Flow Rate (L/min): 2 L/min  Intake/output summary:  Intake/Output Summary (Last 24 hours) at 05/31/16 1056 Last data filed at 05/31/16 0600  Gross per 24 hour  Intake          3378.08 ml  Output                0 ml  Net          3378.08 ml   LBM: Last BM Date: 05/30/16 Baseline Weight: Weight: 49.9 kg (110 lb) Most recent weight: Weight: 49.9 kg (110 lb)  Palliative Assessment/Data: PPS 10-20%   Flowsheet Rows   Flowsheet Row Most Recent Value  Intake Tab  Referral Department  Hospitalist  Unit at Time of Referral  Med/Surg Unit  Palliative Care Primary Diagnosis  Neurology  Date Notified  05/28/16  Palliative Care Type  New Palliative care  Reason for referral  Clarify Goals of Care  Date of Admission  05/27/16  # of days IP prior to Palliative referral  1  Clinical Assessment  Psychosocial & Spiritual Assessment  Palliative Care Outcomes      Patient Active Problem List   Diagnosis Date Noted  . Hypernatremia 05/27/2016  . Depression, major, recurrent ([place]) 03/26/2016  . Dementia without behavioral disturbance 03/26/2016  . Palliative care encounter   . Goals of care, counseling/discussion   . Acute on chronic respiratory failure with hypoxia ([place])   . Pneumonia of both lower lobes due to methicillin resistant Staphylococcus aureus (MRSA) ([place])   . Panlobular emphysema ([place])   . Acute pulmonary  edema ([facility])   . Chronic diastolic CHF (congestive heart failure) ([place])   . Acute kidney injury ([place])   . Oropharyngeal dysphagia   . Aspiration pneumonia ([place]) 03/05/2016  . Pressure injury of skin 03/05/2016  . AKI (acute kidney injury) ([place])   . Torticollis 02/28/2016  . Encephalopathy, metabolic 11/57/2620  . HCAP (healthcare-associated pneumonia) 02/05/2016  . Bronchiectasis ([place]) 06/18/2015  . Insomnia due to anxiety and fear 06/09/2015  . Hearing loss 05/02/2015  . Mass of lower lobe of left lung 04/06/2015  . COPD (chronic obstructive pulmonary disease) ([place]) 03/23/2015  . Other screening mammogram 08/04/2013  . Duodenal ulcer 08/03/2013  . Hypokalemia 08/03/2013  . Hyperlipidemia with target LDL less than 100   . Type II diabetes mellitus with manifestations ([place]) 06/21/2013  . Scoliosis (and kyphoscoliosis), idiopathic 12/12/2012  . Vascular dementia 12/10/2012  . GERD (gastroesophageal reflux disease) 12/09/2012  . GOUT 03/07/2007  . Essential hypertension 03/07/2007    Palliative Care Assessment & Plan   HPI: 75 y.o. female  with past medical history of dementia, osteoarthritis, chronic lower back pain, COPD, DM2, GERD, HLD, HTN, gout, migraines, and recurrent pneumonia from aspiration. Of note, 11/20-12/5/17 she was admitted for acute respiratory failure from HCAP with requirement of intubation; this was felt to be secondary to aspiration from dysphagia. Palliative was involved and had an extensive discussion with HCPOA, [name]. Despite our recommendations against PEG placement, he did want one placed and wanted to continue to allow comfort feeds. This admission, she presented to the ED from [place] home with hyperglycemia and hypernatremia and was subsequently admitted on 05/27/2016. On admission CXR with patchy left basilar infiltrate, and she is subsequently being treated for pneumonia. Labs have also improved with increased hydration. Palliative consulted to  discuss goals of care with HCPOA.  Assessment: I met with Sheila Cisneros, Sheila Cisneros's nephew and HCPOA, on 2/14. Please see consult note for full details of our conversation. In brief, Sheila Cisneros is working to obtain [employer]. Once he achieves this, he will be able to access her accounts and be able to financially capable of transitioning her back to her home with 24 hour support. Once at home, he plans to focus on comfort and transition her to Hospice support. Until he is able to bring her home, he wants to pursue all interventions to keep her alive, to include full code status.  No real changes today. Sheila Cisneros will open her eyes to voice, but is otherwise unresponsive. She does appear comfortable in bed, and care nurse has no acute concerns about unmanaged  symptoms.   Recommendations/Plan:  Full code, full scope interventions  When ready for discharge, plan to d/c back to [facility] with Palliative Care  *Pt with clear goals of care and safe discharge plan. Palliative will follow peripherally and evaluate for any need to re-engage. Please contact team with any acute concerns.   Goals of Care and Additional Recommendations:  Limitations on Scope of Treatment: Full Scope Treatment  Code Status:  Full code  Prognosis:   Unable to determine  Discharge Planning:  SNF with Palliative Care   Care plan was discussed with care nurse.  Thank you for allowing the Palliative Medicine Team to assist in the care of this patient.  Total time: 15 minutes    Greater than 50%  of this time was spent counseling and coordinating care related to the above assessment and plan.  Charlynn Court, NP Palliative Medicine Team [phone] pager (7a-5p) Team Phone # [phone]

## 2016-06-01 LAB — BASIC METABOLIC PANEL
ANION GAP: 8 (ref 5–15)
BUN: 21 mg/dL — ABNORMAL HIGH (ref 6–20)
CALCIUM: 10.2 mg/dL (ref 8.9–10.3)
CHLORIDE: 109 mmol/L (ref 101–111)
CO2: 23 mmol/L (ref 22–32)
Creatinine, Ser: 0.76 mg/dL (ref 0.44–1.00)
GFR calc non Af Amer: >60 mL/min (ref >60)
GLUCOSE: 136 mg/dL — AB (ref 65–99)
Potassium: 3.6 mmol/L (ref 3.5–5.1)
Sodium: 140 mmol/L (ref 135–145)

## 2016-06-01 LAB — GLUCOSE, CAPILLARY
GLUCOSE-CAPILLARY: 136 mg/dL — AB (ref 65–99)
GLUCOSE-CAPILLARY: 142 mg/dL — AB (ref 65–99)
Glucose-Capillary: 131 mg/dL — ABNORMAL HIGH (ref 65–99)
Glucose-Capillary: 174 mg/dL — ABNORMAL HIGH (ref 65–99)

## 2016-06-01 LAB — CULTURE, BLOOD (ROUTINE X 2): Culture: NO GROWTH

## 2016-06-01 LAB — CBC
HEMATOCRIT: 29.6 % — AB (ref 36.0–46.0)
HEMOGLOBIN: 9.2 g/dL — AB (ref 12.0–15.0)
MCH: 28.3 pg (ref 26.0–34.0)
MCHC: 31.1 g/dL (ref 30.0–36.0)
MCV: 91.1 fL (ref 78.0–100.0)
Platelets: 162 10*3/uL (ref 150–400)
RBC: 3.25 MIL/uL — ABNORMAL LOW (ref 3.87–5.11)
RDW: 18.3 % — AB (ref 11.5–15.5)
WBC: 9.4 10*3/uL (ref 4.0–10.5)

## 2016-06-01 LAB — MAGNESIUM: Magnesium: 1.9 mg/dL (ref 1.7–2.4)

## 2016-06-01 MED ORDER — CEPHALEXIN 500 MG PO CAPS: 500.0000 mg | ORAL_CAPSULE | Freq: Four times a day (QID) | ORAL | 0 refills | Status: DC

## 2016-06-01 MED ORDER — GLUCERNA 1.2 CAL PO LIQD: 1000.0000 mL | ORAL | 0 refills | Status: DC

## 2016-06-01 MED ORDER — ENOXAPARIN SODIUM 100 MG/ML ~~LOC~~ SOLN: 50.0000 mg | Freq: Two times a day (BID) | SUBCUTANEOUS | 0 refills | Status: DC

## 2016-06-01 MED ORDER — PRO-STAT SUGAR FREE PO LIQD: 30.0000 mL | Freq: Every day | ORAL | 0 refills | Status: AC

## 2016-06-01 MED ORDER — FREE WATER: 250.0000 mL | Freq: Four times a day (QID) | 0 refills | Status: DC

## 2016-06-01 NOTE — Progress Notes (Signed)
[facility] discharged [facility] via [facility], report was given to nurse Rod Maeaymond Kangogo.

## 2016-06-01 NOTE — Consult Note (Signed)
   [facility] Consult   06/01/2016  Sheila Cisneros 12-17-1940 [id number]   [facility] for 3 admissions in the past 6 months. The patient is 76 y.o.femalewith medical history significant of osteoarthritis, chronic lower back pain, COPD, type 2 diabetes, GERD, COPD, hyperlipidemia, hypertension, gout, migraine headaches, multiple episodes of pneumonia who is coming to the emergency department via EMS [facility] due to hyperglycemia and hypernatremia per MD notes. [facility] is [facility].  Chart reviewed and [facility] is to [facility] and noted Palliative Care has been consulted as well. No current [facility] needs at this time noted.  For questions, please contact:  Charlesetta ShanksVictoria Devante Capano, RN BSN CCM [facility] Liaison  [phone] business mobile phone Toll free office [phone]

## 2016-06-01 NOTE — Clinical Social Work Note (Signed)
Clinical Social Work Assessment  Patient Details  Name: Sheila Cisneros MRN: [id number] Date of Birth: July 29, 1940  Date of referral:  06/01/16               Reason for consult:  Discharge Planning                Permission sought to share information with:  Other (Pt non-verbal and disoriented) Permission granted to share information::   (Pt non-verbal and disoriented)  Name::     Sports administratorcott  Agency::     Relationship::  Nephew  Contact Information:  [phone]  Housing/Transportation Living arrangements for the past 2 months:  Single Family Home Source of Information:  Other (Comment Required) (Nephew) Patient Interpreter Needed:  None Criminal Activity/Legal Involvement Pertinent to Current Situation/Hospitalization:  No - Comment as needed Significant Relationships:  Other Family Members Lives with:  Other (Comment) Do you feel safe going back to the place where you live?  Yes Need for family participation in patient care:  Yes (Comment)  Care giving concerns:  No caregiving concerns identified.   Social Worker assessment / plan:  CSW attempted to consult pt who is disoriented and nonverbal. CSW spoke with nephew (POA), Sheila Cisneros, to address consult for [facility]. CSW introduced herself and explained role of Child psychotherapistsocial worker. Nephew is in agreement with returning [facility]. Nephew stated he is not in a position to provide 24 hours supervision and believes pt will [facility].    CSW [facility] FL[facility] and confirmed with admission that [facility] can [facility]. Pt is ready for [facility]. Nephew is aware and agreeable to discharge plan. [facility] called for transportation. CSW provided RN with room and report and listed in treatment team sticky note. CSW is signing off as no further needs identified.   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   Patient/Family's Understanding of and Emotional Response to Diagnosis, Current Treatment, and Prognosis:  Pt's nephew understands that pt will benefit from rehab prior to returning home.   Emotional Assessment Appearance:  Appears stated age Attitude/Demeanor/Rapport:  Unable to Assess Affect (typically observed):  Unable to Assess Orientation:   (Disoriented) Alcohol / Substance use:  Other Psych involvement (Current and /or in the community):  No (Comment)  Discharge Needs  Concerns to be addressed:  Adjustment to Illness Readmission within the last 30 days:  No Current discharge risk:  Chronically ill Barriers to Discharge:  Continued Medical Work up   Liberty GlobalJeneya G Andalyn Heckstall, LCSW 06/01/2016, 5:06 PM

## 2016-06-01 NOTE — Discharge Summary (Signed)
Physician Discharge Summary  Sheila Cisneros ZOX:[id number] DOB: June 27, 1940 DOA: 05/27/2016  PCP: Sanda Linger, MD  Admit date: 05/27/2016 Discharge date: 06/01/2016  Admitted From: SNF Disposition: SNF  Recommendations for Outpatient Follow-up:  1. Follow up with PCP in 1-2 weeks 2. Please obtain BMP/CBC in one week 3. Further discussion regarding anticoagulation, treatment for DVT 4. Please consult palliative care at the facility     Discharge Condition: stable,.  CODE STATUS: Full code.  Diet recommendation: NPO, on tube feeding.   Brief/Interim Summary: 76 y.o.femalewith medical history significant of osteoarthritis, chronic lower back pain, COPD, type 2 diabetes, GERD, COPD, hyperlipidemia, hypertension, gout, migraine headaches, multiple episodes of pneumonia who is coming to the emergency department via EMS from [facility] due to hyperglycemia and hypernatremia. chest radiograph showeda left patchy basilar infiltrate.   Assessment & Plan:  Hypernatremia; related to hypovolemia.  Continue with IV fluids, free water per tube feeding.  Resolved. NSL. Continue Continue with  free water.   Dementia, Acute Encephalopathy ?  Unclear baseline, but non verbal per records.  Alert, non verbal.  CT head; no acute intracranial abnormalities, old stroke.  ammonia level 30. Palliative care consulted. Family would like to continue with full scope of care. They agree with palliative to follow patient at SNF>   PNA, Health care associated.  Strep pneumonia negative.  Received IV antibiotics for 4 Days.  discharge on keflex for 3 more days.   Blood culture, positive for GPR: one of two. Dyphteroids. Likely contaminant.    HTN; resume verapamil.   Transaminases.  Trending down.   Hypokalemia;  Replaced.   Lower extremity edema; DVT age indeterminate involving femoral, profunda, popliteal and posterior tibial/  Continue with Lovenox. Options discussed with nephew.  He would like to continue with lovenox for now. Further discussion for options.   Decubitus ulcer stage II; wound care consulted.   DM; SSI. Low dose lantus    Discharge Diagnoses:  Principal Problem:   Hypernatremia Active Problems:   Essential hypertension   Type II diabetes mellitus with manifestations (HCC)   Hypokalemia   COPD (chronic obstructive pulmonary disease) (HCC)   HCAP (healthcare-associated pneumonia)   Acute kidney injury (HCC)   Dementia without behavioral disturbance   Palliative care by specialist    Discharge Instructions  Discharge Instructions    Diet - low sodium heart healthy    Complete by:  As directed    Increase activity slowly    Complete by:  As directed      Allergies as of 06/01/2016      Reactions   Codeine Rash, Other (See Comments)    angioedema   Neomycin Itching   Penicillins Itching, Rash       Tetanus Toxoids Rash   Diphtheria Toxoid-containing Vaccines Other (See Comments)   Levofloxacin Rash      Medication List    STOP taking these medications   aspirin 81 MG chewable tablet   aspirin EC 81 MG tablet   simvastatin 20 MG tablet Commonly known as:  ZOCOR   spironolactone 25 MG tablet Commonly known as:  ALDACTONE     TAKE these medications   acetaminophen 500 MG tablet Commonly known as:  TYLENOL Place 1 tablet (500 mg total) into feeding tube every 6 (six) hours as needed. For pain   bisacodyl 10 MG suppository Commonly known as:  DULCOLAX Place 10 mg rectally as needed for moderate constipation.   cephALEXin 500 MG capsule Commonly known as:  KEFLEX Take 1 capsule (500 mg total) by mouth 4 (four) times daily.   cholecalciferol 1000 units tablet Commonly known as:  VITAMIN D Take 1,000 Units by mouth daily. Per tube   enoxaparin 100 MG/ML injection Commonly known as:  LOVENOX Inject 0.5 mLs (50 mg total) into the skin every 12 (twelve) hours.   feeding supplement (GLUCERNA 1.2 CAL) Liqd Place  1,000 mLs into feeding tube continuous. What changed:  Another medication with the same name was removed. Continue taking this medication, and follow the directions you see here.   feeding supplement (PRO-STAT SUGAR FREE 64) Liqd Place 30 mLs into feeding tube daily.   free water Soln Place 250 mLs into feeding tube every 6 (six) hours.   insulin aspart 100 UNIT/ML injection Commonly known as:  novoLOG Inject 10 Units into the skin once.   insulin glargine 100 UNIT/ML injection Commonly known as:  LANTUS Inject 10 Units into the skin at bedtime.   ipratropium-albuterol 0.5-2.5 (3) MG/3ML Soln Commonly known as:  DUONEB Take 3 mLs by nebulization every 6 (six) hours as needed (for shortness of breath).   magnesium hydroxide 400 MG/5ML suspension Commonly known as:  MILK OF MAGNESIA Take 30 mLs by mouth daily as needed for mild constipation.   TAB-A-VITE Tabs Place 1 tablet into feeding tube daily.   multivitamin capsule Place 1 capsule into feeding tube daily.   RA SALINE ENEMA 19-7 GM/118ML Enem Place 1 each rectally as needed (for constipation).   Tiotropium Bromide-Olodaterol 2.5-2.5 MCG/ACT Aers Commonly known as:  STIOLTO RESPIMAT Inhale 2 puffs into the lungs daily.   venlafaxine 37.5 MG tablet Commonly known as:  EFFEXOR Place 1 tablet (37.5 mg total) into feeding tube See admin instructions. Takes 1 capsule daily, then can take 1 addt'l cap as needed for hot flashes What changed:  when to take this  additional instructions  Another medication with the same name was removed. Continue taking this medication, and follow the directions you see here.   verapamil 40 MG tablet Commonly known as:  CALAN Place 40 mg into feeding tube every 8 (eight) hours. What changed:  Another medication with the same name was removed. Continue taking this medication, and follow the directions you see here.   vitamin C 500 MG tablet Commonly known as:  ASCORBIC ACID Take 500 mg  by mouth daily. Per tube       Allergies  Allergen Reactions  . Codeine Rash and Other (See Comments)     angioedema  . Neomycin Itching  . Penicillins Itching and Rash       . Tetanus Toxoids Rash  . Diphtheria Toxoid-Containing Vaccines Other (See Comments)  . Levofloxacin Rash    Consultations:  Palliative care    Procedures/Studies: Ct Head Wo Contrast  Result Date: 05/29/2016 CLINICAL DATA:  Acute encephalopathy EXAM: CT HEAD WITHOUT CONTRAST TECHNIQUE: Contiguous axial images were obtained from the base of the skull through the vertex without intravenous contrast. COMPARISON:  02/05/2016 FINDINGS: Brain: There is atrophy and chronic small vessel disease changes. 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 No acute intracranial abnormality. Specifically, no hemorrhage, hydrocephalus, mass lesion, acute infarction, or significant intracranial injury. Vascular: No hyperdense vessel or unexpected calcification. Skull: No acute calvarial abnormality. Sinuses/Orbits: Mucosal thickening in the paranasal sinuses. No air-fluid levels. Mastoid air cells are clear. Orbital soft tissues unremarkable. Other: None IMPRESSION: No acute intracranial abnormality. Atrophy, chronic microvascular disease. 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 Electronically Signed  By: Charlett Nose M.D.   On: 05/29/2016 09:46   Dg Chest Portable 1 View  Result Date: 05/27/2016 CLINICAL DATA:  Altered mental status EXAM: PORTABLE CHEST 1 VIEW COMPARISON:  03/10/2016 FINDINGS: Cardiac shadow is within normal limits. Previously seen endotracheal tube and nasogastric catheter have been removed. Patchy left basilar infiltrate is seen. The right lung is clear. IMPRESSION: Patchy left basilar infiltrate. Followup PA and lateral chest X-ray is recommended in 3-4 weeks following trial of antibiotic therapy to ensure resolution and exclude underlying malignancy. Electronically Signed   By: Alcide Clever M.D.   On: 05/27/2016 22:23       Subjective: Alert, non verbal.   Discharge Exam: Vitals:   05/31/16 2139 06/01/16 0545  BP: (!) 152/77 (!) 152/85  Pulse: 99 (!) 107  Resp: 18 20  Temp: 99.1 F (37.3 C) 98.2 F (36.8 C)   Vitals:   05/31/16 1600 05/31/16 1616 05/31/16 2139 06/01/16 0545  BP: 130/77 134/64 (!) 152/77 (!) 152/85  Pulse:  89 99 (!) 107  Resp: 18 20 18 20   Temp:  99 F (37.2 C) 99.1 F (37.3 C) 98.2 F (36.8 C)  TempSrc:  Axillary Axillary Axillary  SpO2:  100% 100% 93%  Weight:      Height:        General: Pt is alert, , non verbal, not in acute distress Cardiovascular: RRR, S1/S2 +, no rubs, no gallops Respiratory: CTA bilaterally, no wheezing, no rhonchi Abdominal: Soft, NT, ND, bowel sounds + Extremities: left LE with edema    The results of significant diagnostics from this hospitalization (including imaging, microbiology, ancillary and laboratory) are listed below for reference.     Microbiology: Recent Results (from the past 240 hour(s))  Blood Culture (routine x 2)     Status: Abnormal   Collection Time: 05/27/16 10:11 PM  Result Value Ref Range Status   Specimen Description BLOOD RIGHT WRIST  Final   Special Requests   Final    BOTTLES DRAWN AEROBIC AND ANAEROBIC 5CC BLUE 4CC RED   Culture  Setup Time   Final    GRAM POSITIVE RODS AEROBIC BOTTLE ONLY Organism ID to follow CRITICAL RESULT CALLED TO, READ BACK BY AND VERIFIED WITH: L POWELL PHARMD 2024 05/29/16 A BROWNING    Culture (A)  Final    DIPHTHEROIDS(CORYNEBACTERIUM SPECIES) Standardized susceptibility testing for this organism is not available.    Report Status 05/30/2016 FINAL  Final  Blood Culture ID Panel (Reflexed)     Status: None   Collection Time: 05/27/16 10:11 PM  Result Value Ref Range Status   Enterococcus species NOT DETECTED NOT DETECTED Final   Listeria monocytogenes NOT DETECTED NOT DETECTED Final   Staphylococcus species NOT DETECTED NOT DETECTED  Final   Staphylococcus aureus NOT DETECTED NOT DETECTED Final   Streptococcus species NOT DETECTED NOT DETECTED Final   Streptococcus agalactiae NOT DETECTED NOT DETECTED Final   Streptococcus pneumoniae NOT DETECTED NOT DETECTED Final   Streptococcus pyogenes NOT DETECTED NOT DETECTED Final   Acinetobacter baumannii NOT DETECTED NOT DETECTED Final   Enterobacteriaceae species NOT DETECTED NOT DETECTED Final   Enterobacter cloacae complex NOT DETECTED NOT DETECTED Final   Escherichia coli NOT DETECTED NOT DETECTED Final   Klebsiella oxytoca NOT DETECTED NOT DETECTED Final   Klebsiella pneumoniae NOT DETECTED NOT DETECTED Final   Proteus species NOT DETECTED NOT DETECTED Final   Serratia marcescens NOT DETECTED NOT DETECTED Final   Haemophilus influenzae NOT DETECTED NOT  DETECTED Final   Neisseria meningitidis NOT DETECTED NOT DETECTED Final   Pseudomonas aeruginosa NOT DETECTED NOT DETECTED Final   Candida albicans NOT DETECTED NOT DETECTED Final   Candida glabrata NOT DETECTED NOT DETECTED Final   Candida krusei NOT DETECTED NOT DETECTED Final   Candida parapsilosis NOT DETECTED NOT DETECTED Final   Candida tropicalis NOT DETECTED NOT DETECTED Final  Blood Culture (routine x 2)     Status: None (Preliminary result)   Collection Time: 05/27/16 10:37 PM  Result Value Ref Range Status   Specimen Description BLOOD RIGHT HAND  Final   Special Requests IN PEDIATRIC BOTTLE 3CC  Final   Culture NO GROWTH 4 DAYS  Final   Report Status PENDING  Incomplete  MRSA PCR Screening     Status: Abnormal   Collection Time: 05/28/16  6:42 AM  Result Value Ref Range Status   MRSA by PCR POSITIVE (A) NEGATIVE Final    Comment:        The GeneXpert MRSA Assay (FDA approved for NASAL specimens only), is one component of a comprehensive MRSA colonization surveillance program. It is not intended to diagnose MRSA infection nor to guide or monitor treatment for MRSA infections. RESULT CALLED TO, READ  BACK BY AND VERIFIED WITH: E. MOORE [phone] 02.12.2018 N MORRIS      Labs: BNP (last 3 results)  Recent Labs  03/05/16 1826  BNP 33.0   Basic Metabolic Panel:  Recent Labs Lab 05/28/16 1238 05/29/16 0135 05/30/16 0911 05/31/16 0222 06/01/16 0540  NA 156* 147* 141 141 140  K 3.9 3.4* 3.3* 3.3* 3.6  CL 122* 114* 113* 112* 109  CO2 28 24 22  21* 23  GLUCOSE 189* 263* 165* 155* 136*  BUN 41* 42* 30* 26* 21*  CREATININE 1.23* 1.00 0.81 0.74 0.76  CALCIUM 11.2* 10.0 9.7 10.0 10.2  MG  --   --   --   --  1.9   Liver Function Tests:  Recent Labs Lab 05/27/16 2212 05/28/16 0450  AST 48* 43*  ALT 69* 54  ALKPHOS 142* 114  BILITOT 0.4 1.1  PROT 7.9 6.9  ALBUMIN 2.6* 2.3*   No results for input(s): LIPASE, AMYLASE in the last 168 hours.  Recent Labs Lab 05/29/16 1126  AMMONIA 30   CBC:  Recent Labs Lab 05/27/16 2205  05/28/16 0450 05/29/16 0135 05/30/16 0233 05/31/16 0222 06/01/16 0540  WBC 12.8*  --  13.6* 12.2* 8.8 10.2 9.4  NEUTROABS 9.2*  --  9.4* 8.2* 5.4  --   --   HGB 12.2  < > 10.9* 9.3* 9.7* 10.0* 9.2*  HCT 41.5  < > 36.9 30.9* 31.1* 31.0* 29.6*  MCV 97.6  --  96.9 94.2 92.0 89.9 91.1  PLT 169  --  137* 131* 131* 152 162  < > = values in this interval not displayed. Cardiac Enzymes: No results for input(s): CKTOTAL, CKMB, CKMBINDEX, TROPONINI in the last 168 hours. BNP: Invalid input(s): POCBNP CBG:  Recent Labs Lab 05/31/16 1309 05/31/16 1824 06/01/16 0015 06/01/16 0544 06/01/16 0755  GLUCAP 148* 141* 174* 136* 131*   D-Dimer No results for input(s): DDIMER in the last 72 hours. Hgb A1c No results for input(s): HGBA1C in the last 72 hours. Lipid Profile No results for input(s): CHOL, HDL, LDLCALC, TRIG, CHOLHDL, LDLDIRECT in the last 72 hours. Thyroid function studies No results for input(s): TSH, T4TOTAL, T3FREE, THYROIDAB in the last 72 hours.  Invalid input(s): FREET3 Anemia work up No results  for input(s): VITAMINB12,  FOLATE, FERRITIN, TIBC, IRON, RETICCTPCT in the last 72 hours. Urinalysis    Component Value Date/Time   COLORURINE YELLOW 05/27/2016 2151   APPEARANCEUR HAZY (A) 05/27/2016 2151   LABSPEC 1.022 05/27/2016 2151   PHURINE 8.0 05/27/2016 2151   GLUCOSEU >=500 (A) 05/27/2016 2151   GLUCOSEU NEGATIVE 03/23/2015 1432   HGBUR NEGATIVE 05/27/2016 2151   BILIRUBINUR NEGATIVE 05/27/2016 2151   KETONESUR NEGATIVE 05/27/2016 2151   PROTEINUR NEGATIVE 05/27/2016 2151   UROBILINOGEN 0.2 03/23/2015 1432   NITRITE NEGATIVE 05/27/2016 2151   LEUKOCYTESUR NEGATIVE 05/27/2016 2151   Sepsis Labs Invalid input(s): PROCALCITONIN,  WBC,  LACTICIDVEN Microbiology Recent Results (from the past 240 hour(s))  Blood Culture (routine x 2)     Status: Abnormal   Collection Time: 05/27/16 10:11 PM  Result Value Ref Range Status   Specimen Description BLOOD RIGHT WRIST  Final   Special Requests   Final    BOTTLES DRAWN AEROBIC AND ANAEROBIC 5CC BLUE 4CC RED   Culture  Setup Time   Final    GRAM POSITIVE RODS AEROBIC BOTTLE ONLY Organism ID to follow CRITICAL RESULT CALLED TO, READ BACK BY AND VERIFIED WITH: L POWELL PHARMD 2024 05/29/16 A BROWNING    Culture (A)  Final    DIPHTHEROIDS(CORYNEBACTERIUM SPECIES) Standardized susceptibility testing for this organism is not available.    Report Status 05/30/2016 FINAL  Final  Blood Culture ID Panel (Reflexed)     Status: None   Collection Time: 05/27/16 10:11 PM  Result Value Ref Range Status   Enterococcus species NOT DETECTED NOT DETECTED Final   Listeria monocytogenes NOT DETECTED NOT DETECTED Final   Staphylococcus species NOT DETECTED NOT DETECTED Final   Staphylococcus aureus NOT DETECTED NOT DETECTED Final   Streptococcus species NOT DETECTED NOT DETECTED Final   Streptococcus agalactiae NOT DETECTED NOT DETECTED Final   Streptococcus pneumoniae NOT DETECTED NOT DETECTED Final   Streptococcus pyogenes NOT DETECTED NOT DETECTED Final    Acinetobacter baumannii NOT DETECTED NOT DETECTED Final   Enterobacteriaceae species NOT DETECTED NOT DETECTED Final   Enterobacter cloacae complex NOT DETECTED NOT DETECTED Final   Escherichia coli NOT DETECTED NOT DETECTED Final   Klebsiella oxytoca NOT DETECTED NOT DETECTED Final   Klebsiella pneumoniae NOT DETECTED NOT DETECTED Final   Proteus species NOT DETECTED NOT DETECTED Final   Serratia marcescens NOT DETECTED NOT DETECTED Final   Haemophilus influenzae NOT DETECTED NOT DETECTED Final   Neisseria meningitidis NOT DETECTED NOT DETECTED Final   Pseudomonas aeruginosa NOT DETECTED NOT DETECTED Final   Candida albicans NOT DETECTED NOT DETECTED Final   Candida glabrata NOT DETECTED NOT DETECTED Final   Candida krusei NOT DETECTED NOT DETECTED Final   Candida parapsilosis NOT DETECTED NOT DETECTED Final   Candida tropicalis NOT DETECTED NOT DETECTED Final  Blood Culture (routine x 2)     Status: None (Preliminary result)   Collection Time: 05/27/16 10:37 PM  Result Value Ref Range Status   Specimen Description BLOOD RIGHT HAND  Final   Special Requests IN PEDIATRIC BOTTLE 3CC  Final   Culture NO GROWTH 4 DAYS  Final   Report Status PENDING  Incomplete  MRSA PCR Screening     Status: Abnormal   Collection Time: 05/28/16  6:42 AM  Result Value Ref Range Status   MRSA by PCR POSITIVE (A) NEGATIVE Final    Comment:        The GeneXpert MRSA Assay (FDA approved for NASAL specimens  only), is one component of a comprehensive MRSA colonization surveillance program. It is not intended to diagnose MRSA infection nor to guide or monitor treatment for MRSA infections. RESULT CALLED TO, READ BACK BY AND VERIFIED WITH: EChristell Constant [phone] 02.12.2018 N MORRIS      Time coordinating discharge: Over 30 minutes  SIGNED:   Alba Cory, MD  [facility] 06/01/2016, 8:45 AM Pager [phone]  If 7PM-7AM, please contact night-coverage [email or web address] Password [email or web address]

## 2016-06-01 NOTE — Clinical Social Work Placement (Signed)
   CLINICAL SOCIAL WORK PLACEMENT  NOTE  Date:  06/01/2016  Patient Details  Name: Sheila Cisneros MRN: [id number] Date of Birth: September 22, 1940  Clinical Social Work is seeking post-discharge placement for this patient at the Skilled  Nursing Facility level of care (*CSW will initial, date and re-position this form in  chart as items are completed):  Yes   Patient/family provided with [facility] Social Work Department's list of facilities offering this level of care within the geographic area requested by the patient (or if unable, by the patient's family).  Yes   Patient/family informed of their freedom to choose among providers that offer the needed level of care, that participate in Medicare, Medicaid or managed care program needed by the patient, have an available bed and are willing to accept the patient.  Yes   Patient/family informed of [facility]'s ownership interest [facility] and [facility], as well as of the fact that they are under no obligation to receive care at these facilities.  PASRR submitted to EDS on       PASRR number received on       Existing PASRR number confirmed on 06/01/16     FL2 transmitted to all facilities in geographic area requested by pt/family on 06/01/16     FL2 transmitted to all facilities within larger geographic area on       Patient informed that his/her managed care company has contracts with or will negotiate with certain facilities, including the following:        Yes   Patient/family informed of bed offers received.  [facility]     Physician recommends and patient chooses bed at      [facility] 06/01/16.  Patient to be transferred to facility by [facility]     Patient family notified on 06/01/16 of transfer.  Name of family member notified:  [name]     PHYSICIAN       Additional Comment:     _______________________________________________ Dominic PeaJeneya G Aleira Deiter, LCSW 06/01/2016, 5:17 PM

## 2016-06-01 NOTE — [place] FL2 (Signed)
[place] MEDICAID FL2 LEVEL OF CARE SCREENING TOOL     IDENTIFICATION  Patient Name: Sheila Cisneros Birthdate: 04-Apr-1941 Sex: female Admission Date (Current Location): 05/27/2016  [facility] and [place] Number:  Producer, television/film/video and Address:  [facility], [address], [place], [place] [address]      Provider Number: [id number]  Attending Physician Name and Address:  Alba Cory, MD  Relative Name and Phone Number:       Current Level of Care: Hospital Recommended Level of Care: Skilled Nursing Facility Prior Approval Number:    Date Approved/Denied:   PASRR Number: [id number]  Discharge Plan: SNF    Current Diagnoses: Patient Active Problem List   Diagnosis Date Noted  . Palliative care by specialist   . Hypernatremia 05/27/2016  . Depression, major, recurrent (HCC) 03/26/2016  . Dementia without behavioral disturbance 03/26/2016  . Palliative care encounter   . Goals of care, counseling/discussion   . Acute on chronic respiratory failure with hypoxia (HCC)   . Pneumonia of both lower lobes due to methicillin resistant Staphylococcus aureus (MRSA) (HCC)   . Panlobular emphysema (HCC)   . Acute pulmonary edema (HCC)   . Chronic diastolic CHF (congestive heart failure) (HCC)   . Acute kidney injury (HCC)   . Oropharyngeal dysphagia   . Aspiration pneumonia (HCC) 03/05/2016  . Pressure injury of skin 03/05/2016  . AKI (acute kidney injury) (HCC)   . Torticollis 02/28/2016  . Encephalopathy, metabolic 02/07/2016  . HCAP (healthcare-associated pneumonia) 02/05/2016  . Bronchiectasis (HCC) 06/18/2015  . Insomnia due to anxiety and fear 06/09/2015  . Hearing loss 05/02/2015  . Mass of lower lobe of left lung 04/06/2015  . COPD (chronic obstructive pulmonary disease) (HCC) 03/23/2015  . Other screening mammogram 08/04/2013  . Duodenal ulcer 08/03/2013  . Hypokalemia 08/03/2013  . Hyperlipidemia with target LDL less than 100   .  Type II diabetes mellitus with manifestations (HCC) 06/21/2013  . Scoliosis (and kyphoscoliosis), idiopathic 12/12/2012  . Vascular dementia 12/10/2012  . Acute encephalopathy 12/09/2012  . GERD (gastroesophageal reflux disease) 12/09/2012  . GOUT 03/07/2007  . Essential hypertension 03/07/2007    Orientation RESPIRATION BLADDER Height & Weight     Self  Normal Incontinent Weight: 110 lb (49.9 kg) Height:  5\' 2"  (157.5 cm)  BEHAVIORAL SYMPTOMS/MOOD NEUROLOGICAL BOWEL NUTRITION STATUS      Continent Diet (NPO; on tube feeding)  AMBULATORY STATUS COMMUNICATION OF NEEDS Skin   Extensive Assist Non-Verbally PU Stage and Appropriate Care   PU Stage 2 Dressing:  (Change every 3 days; medial buttocks; Foam dressing)                   Personal Care Assistance Level of Assistance  Bathing, Feeding, Dressing Bathing Assistance: Maximum assistance Feeding assistance: Limited assistance Dressing Assistance: Maximum assistance     Functional Limitations Info  Sight, Hearing Sight Info: Adequate Hearing Info: Adequate Speech Info: Impaired    SPECIAL CARE FACTORS FREQUENCY                       Contractures Contractures Info: Not present    Additional Factors Info  Code Status, Allergies, Isolation Precautions, Insulin Sliding Scale Code Status Info: Full Allergies Info: Codeine, Neomycin, Penicillins, Tetanus Toxoids, Diphtheria Toxoid-containing Vaccines, Levofloxacin   Insulin Sliding Scale Info: Novolog-under meds Isolation Precautions Info: Contact precautions     Current Medications (06/01/2016):  This is the current hospital active  medication list Current Facility-Administered Medications  Medication Dose Route Frequency Provider Last Rate Last Dose  . ceFEPIme (MAXIPIME) 1 g in dextrose 5 % 50 mL IVPB  1 g Intravenous Q24H Titus MouldCaron G Amend, RPH   1 g at 06/01/16 0516  . chlorhexidine (PERIDEX) 0.12 % solution 15 mL  15 mL Mouth Rinse BID Dron Jaynie CollinsPrasad Bhandari, MD    15 mL at 06/01/16 0948  . Chlorhexidine Gluconate Cloth 2 % PADS 6 each  6 each Topical Q0600 Dron Jaynie CollinsPrasad Bhandari, MD   6 each at 06/01/16 [id number]  . enoxaparin (LOVENOX) injection 50 mg  50 mg Subcutaneous Q12H Belkys A Regalado, MD   50 mg at 06/01/16 0609  . feeding supplement (GLUCERNA 1.2 CAL) liquid 1,000 mL  1,000 mL Per Tube Continuous Dron Jaynie CollinsPrasad Bhandari, MD 45 mL/hr at 05/31/16 1551 1,000 mL at 05/31/16 1551  . feeding supplement (PRO-STAT SUGAR FREE 64) liquid 30 mL  30 mL Per Tube Daily Dron Jaynie CollinsPrasad Bhandari, MD   30 mL at 06/01/16 0948  . free water 250 mL  250 mL Per Tube Q6H Belkys A Regalado, MD   250 mL at 06/01/16 0610  . insulin aspart (novoLOG) injection 0-9 Units  0-9 Units Subcutaneous Q6H Dron Jaynie CollinsPrasad Bhandari, MD   1 Units at 06/01/16 [phone]  . insulin glargine (LANTUS) injection 10 Units  10 Units Subcutaneous QHS Dron Jaynie CollinsPrasad Bhandari, MD   10 Units at 05/31/16 2315  . ipratropium-albuterol (DUONEB) 0.5-2.5 (3) MG/3ML nebulizer solution 3 mL  3 mL Nebulization Q6H PRN Bobette Moavid Manuel Ortiz, MD      . MEDLINE mouth rinse  15 mL Mouth Rinse q12n4p Dron Jaynie CollinsPrasad Bhandari, MD   15 mL at 05/31/16 1551  . sodium chloride flush (NS) 0.9 % injection 3 mL  3 mL Intravenous Q12H Bobette Moavid Manuel Ortiz, MD   3 mL at 06/01/16 0948  . verapamil (CALAN) tablet 40 mg  40 mg Per Tube Q8H Belkys A Regalado, MD   40 mg at 06/01/16 [id number]     Discharge Medications: Please see discharge summary for a list of discharge medications.  Relevant Imaging Results:  Relevant Lab Results:   Additional Information SSN: [id number]  Dominic PeaJeneya G Orah Sonnen, LCSW

## 2016-06-02 ENCOUNTER — Non-Acute Institutional Stay (SKILLED_NURSING_FACILITY): Payer: Medicare Other | Admitting: Internal Medicine

## 2016-06-02 DIAGNOSIS — E870 Hyperosmolality and hypernatremia: Secondary | ICD-10-CM | POA: Diagnosis not present

## 2016-06-02 DIAGNOSIS — Z931 Gastrostomy status: Secondary | ICD-10-CM | POA: Diagnosis not present

## 2016-06-02 DIAGNOSIS — J690 Pneumonitis due to inhalation of food and vomit: Secondary | ICD-10-CM

## 2016-06-02 DIAGNOSIS — F0390 Unspecified dementia without behavioral disturbance: Secondary | ICD-10-CM

## 2016-06-02 DIAGNOSIS — E118 Type 2 diabetes mellitus with unspecified complications: Secondary | ICD-10-CM

## 2016-06-02 NOTE — Progress Notes (Signed)
06/02/16  Facility; [facility]  Chief complaint; readmission to SNF after his stay [facility] from 2/11 through 06/01/16. She was admitted to hospital after being transferred there for apparent hypernatremia and hyperglycemia.. Chest x-ray showed left patchy basilar infiltrate. On arrival her sodium was 159, BUN 47 creatinine 1.39. Her sodium got up to a maximum of 165 in the hospital. Presumably gradually corrected over time. Yesterday her sodium was 140 potassium 3.6 glucose 136 BUN 27 and creatinine 0.76. White count 9.4 hemoglobin 9.2. She received 4 days of IV antibiotics and discharged on Keflex for 3 more days for presumed pneumonia. One out of 2 blood cultures was positive for presumed diphtheroids  The patient was also noted to have left lower extremity edema. Duplex ultrasound showed age-indeterminate clot involving the femoral profunda popliteal and posterior tibial on the left. She was started on Lovenox, not sure why this was chosen. I note she has had previous records of a DVT in the right leg that was not treated at that time secondary to proximity to GI bleeding. The patient also had palliative care involved however the patient's responsible party apparently wanted everything done  The patient has a PEG feeding tube. Looking back in the record this appears to been put in during the hospitalization in December after she had aspirated with pneumonia. As far as I can tell the underlying diagnosis here is advanced dementia.  The patient's admitting glucose was 366. She was discharged on Lantus 10 units and a sliding scale NovoLog. Her blood sugar this morning was 164 and at noon today 222  BMP Latest Ref Rng & Units 06/01/2016 05/31/2016 05/30/2016  Glucose 65 - 99 mg/dL 161(W136(H) 960(A155(H) 540(J165(H)  BUN 6 - 20 mg/dL 81(X21(H) 91(Y26(H) 78(G30(H)  Creatinine 0.44 - 1.00 mg/dL 9.560.76 2.130.74 0.860.81  Sodium 135 - 145 mmol/L 140 141 141  Potassium 3.5 - 5.1 mmol/L 3.6 3.3(L) 3.3(L)  Chloride 101 - 111 mmol/L 109  112(H) 113(H)  CO2 22 - 32 mmol/L 23 21(L) 22  Calcium 8.9 - 10.3 mg/dL 57.810.2 46.910.0 9.7   CBC Latest Ref Rng & Units 06/01/2016 05/31/2016 05/30/2016  WBC 4.0 - 10.5 K/uL 9.4 10.2 8.8  Hemoglobin 12.0 - 15.0 g/dL 6.2(X9.2(L) 10.0(L) 9.7(L)  Hematocrit 36.0 - 46.0 % 29.6(L) 31.0(L) 31.1(L)  Platelets 150 - 400 K/uL 162 152 131(L)    Past Medical History:  Diagnosis Date  . Altered mental status 12/09/2012  . Arthritis    "all over" (12/09/2012)  . Chronic lower back pain   . COPD (chronic obstructive pulmonary disease) (HCC)   . Diabetes mellitus without complication (HCC)   . Exertional shortness of breath   . GERD (gastroesophageal reflux disease)    rolaids if needed  . Gout, unspecified   . Hepatitis, unspecified    "the one that's not bad" (12/09/2012)  . History of blood transfusion    "related to a surgery, I think" (12/09/2012)  . Hyperlipidemia   . Hypertension   . Migraines    "in the past" (12/09/2012)  . Osteoarthrosis, unspecified whether generalized or localized, unspecified site   . Other chronic sinusitis   . Pneumonia    "couple times; long time ago" (12/09/2012)  . Unspecified chronic bronchitis    sees Dr. Melba Coon. [place], last visit 08/2011, treated for E. Coli- resp. /w Ceftin, Feb. 2013    has a past surgical history that includes Tubal ligation; Tonsillectomy; Shoulder arthroscopy w/ rotator cuff repair (Right); Carpal tunnel release (09/06/2011); Abdominal hysterectomy;  Appendectomy; Breast biopsy (Right); Esophagogastroduodenoscopy (N/A, 06/04/2013); and ir generic historical (03/19/2016).  Current Outpatient Prescriptions on File Prior to Visit  Medication Sig Dispense Refill  . acetaminophen (TYLENOL) 500 MG tablet Place 1 tablet (500 mg total) into feeding tube every 6 (six) hours as needed. For pain 30 tablet 0  . Amino Acids-Protein Hydrolys (FEEDING SUPPLEMENT, PRO-STAT SUGAR FREE 64,) LIQD Place 30 mLs into feeding tube daily. 900 mL 0  . Ascorbic Acid (VITAMIN C) 500  MG tablet Take 500 mg by mouth daily. Per tube    . bisacodyl (DULCOLAX) 10 MG suppository Place 10 mg rectally as needed for moderate constipation.    . cephALEXin (KEFLEX) 500 MG capsule Take 1 capsule (500 mg total) by mouth 4 (four) times daily. 6 capsule 0  . cholecalciferol (VITAMIN D) 1000 UNITS tablet Take 1,000 Units by mouth daily. Per tube    . enoxaparin (LOVENOX) 100 MG/ML injection Inject 0.5 mLs (50 mg total) into the skin every 12 (twelve) hours. 30 Syringe 0  . insulin aspart (NOVOLOG) 100 UNIT/ML injection Inject 10 Units into the skin once.    . insulin glargine (LANTUS) 100 UNIT/ML injection Inject 10 Units into the skin at bedtime.    Marland Kitchen ipratropium-albuterol (DUONEB) 0.5-2.5 (3) MG/3ML SOLN Take 3 mLs by nebulization every 6 (six) hours as needed (for shortness of breath).    . magnesium hydroxide (MILK OF MAGNESIA) 400 MG/5ML suspension Take 30 mLs by mouth daily as needed for mild constipation.    . Multiple Vitamin (MULTIVITAMIN) capsule Place 1 capsule into feeding tube daily. (Patient not taking: Reported on 05/27/2016)    . Multiple Vitamin (TAB-A-VITE) TABS Place 1 tablet into feeding tube daily.    . Nutritional Supplements (FEEDING SUPPLEMENT, GLUCERNA 1.2 CAL,) LIQD Place 1,000 mLs into feeding tube continuous. 300 mL 0  . Sodium Phosphates (RA SALINE ENEMA) 19-7 GM/118ML ENEM Place 1 each rectally as needed (for constipation).    . Tiotropium Bromide-Olodaterol (STIOLTO RESPIMAT) 2.5-2.5 MCG/ACT AERS Inhale 2 puffs into the lungs daily. 4 g 11  . venlafaxine (EFFEXOR) 37.5 MG tablet Place 1 tablet (37.5 mg total) into feeding tube See admin instructions. Takes 1 capsule daily, then can take 1 addt'l cap as needed for hot flashes (Patient taking differently: Place 37.5 mg into feeding tube daily. )    . verapamil (CALAN) 40 MG tablet Place 40 mg into feeding tube every 8 (eight) hours.    . Water For Irrigation, Sterile (FREE WATER) SOLN Place 250 mLs into feeding tube  every 6 (six) hours. 500 mL 0     reports that she has never smoked. She has never used smokeless tobacco. She reports that she does not drink alcohol or use drugs.  Review of systems; not possible secondary to patient's inability to respond/advanced dementia  Physical examination Gen. patient is lying in bed. Occasionally will answer questions with one-word HEENT; she'll not open her mouth. No scleral icterus Respiratory upper airway sounds no wheezing work of breathing is normal Cardiac heart sounds are normal no murmurs she does not appear to be dehydrated Abdomen PEG tube in place no liver no spleen no tenderness GU bladder not distended there is no tenderness and no CVA tenderness. Skin; she has foam over elbow very tiny surface abrasion. She had a large foam over her upper buttocks and coccyx area. I did not see a wound care although one is listed at stage II Extremities; indeed the left leg is more swollen than  the right however there is no warmth and no tenderness. She has bunny boots on both lower extremities I did not remove these. Skin no rashes. Musculoskeletal; both knees have small effusions did not assist appear to be tender. Mental status; patient did not respond any commands occasionally will say 1 word. Left arm contracted both hands contracted at the wrists  Impression/plan #1 from a brief review of the chart would appear that this lady has advanced dementia although the course of this is not easy to follow. She is now PEG tube dependent on Glucerna at 45 ML's per hour #2 severe hypernatremia. This is being corrected. She is on free fluid flushes through her PEG tube. This will need to be followed #3 hyperglycemia on Lantus and NovoLog this appears to be stable today. Last hemoglobin A1c was in October at 6.4. I wonder whether long-acting insulin is going to be necessary. As mentioned her blood sugar was 1/5 64 this morning to 222 when I asked the staff to check it #4  treated for pneumonia although I think she has a history of bronchiectasis #5 left leg DVT now on Lovenox I think this can probably be changed at some point #6 hypertension #7 question of palliative care/hospice. Apparently these approaches been denied by the patient's nephew. She is full code.

## 2016-06-04 LAB — CBC AND DIFFERENTIAL
HEMATOCRIT: 30 % — AB (ref 36–46)
HEMOGLOBIN: 9.7 g/dL — AB (ref 12.0–16.0)
Platelets: 210 10*3/uL (ref 150–399)
WBC: 9.8 10^3/mL

## 2016-06-04 LAB — BASIC METABOLIC PANEL
BUN: 21 mg/dL (ref 4–21)
Creatinine: 0.7 mg/dL (ref 0.5–1.1)
GLUCOSE: 261 mg/dL
Potassium: 3.4 mmol/L (ref 3.4–5.3)
SODIUM: 146 mmol/L (ref 137–147)

## 2016-06-08 LAB — CBC AND DIFFERENTIAL
HCT: 35 % — AB (ref 36–46)
Hemoglobin: 11.2 g/dL — AB (ref 12.0–16.0)
Neutrophils Absolute: 9 /uL
PLATELETS: 248 10*3/uL (ref 150–399)
WBC: 13.6 10^3/mL

## 2016-06-08 LAB — BASIC METABOLIC PANEL
BUN: 28 mg/dL — AB (ref 4–21)
Creatinine: 0.7 mg/dL (ref 0.5–1.1)
GLUCOSE: 171 mg/dL
Potassium: 3.5 mmol/L (ref 3.4–5.3)
Sodium: 137 mmol/L (ref 137–147)

## 2016-06-08 LAB — LIPID PANEL
CHOLESTEROL: 111 mg/dL (ref 0–200)
HDL: 37 mg/dL (ref 35–70)
LDL CALC: 58 mg/dL
LDl/HDL Ratio: 3
Triglycerides: 83 mg/dL (ref 40–160)

## 2016-06-08 LAB — HEMOGLOBIN A1C: Hemoglobin A1C: 7.9

## 2016-06-08 LAB — VITAMIN D 25 HYDROXY (VIT D DEFICIENCY, FRACTURES): VIT D 25 HYDROXY: 36.84

## 2016-06-12 LAB — CBC AND DIFFERENTIAL
HCT: 31 % — AB (ref 36–46)
HEMOGLOBIN: 10.4 g/dL — AB (ref 12.0–16.0)
Neutrophils Absolute: 6 /uL
Platelets: 269 10*3/uL (ref 150–399)
WBC: 10.8 10^3/mL

## 2016-07-27 LAB — HM DIABETES FOOT EXAM

## 2016-09-18 ENCOUNTER — Encounter: Payer: Self-pay | Admitting: Internal Medicine

## 2016-09-18 NOTE — Progress Notes (Signed)
   NURSING HOME LOCATION:  [facility] ROOM NUMBER:  [id number]  CODE STATUS:  Full Code  PCP:  Etta GrandchildJones, Thomas L, MD [address] [place] [id number]   This is a nursing facility follow up for specific acute issue of  of chronic medical diagnoses  Nursing Facility readmission within 30 days  Interim medical record and care [facility] was updated with review of diagnostic studies and change in clinical status since last visit were documented.  HPI:  Review of systems: Dementia invalidated responses. Date given as   Constitutional: No fever,significant weight change, fatigue  Eyes: No redness, discharge, pain, vision change ENT/mouth: No nasal congestion,  purulent discharge, earache,change in hearing ,sore throat  Cardiovascular: No chest pain, palpitations,paroxysmal nocturnal dyspnea, claudication, edema  Respiratory: No cough, sputum production,hemoptysis, DOE , significant snoring,apnea  Gastrointestinal: No heartburn,dysphagia,abdominal pain, nausea / vomiting,rectal bleeding, melena,change in bowels Genitourinary: No dysuria,hematuria, pyuria,  incontinence, nocturia Musculoskeletal: No joint stiffness, joint swelling, weakness,pain Dermatologic: No rash, pruritus, change in appearance of skin Neurologic: No dizziness,headache,syncope, seizures, numbness , tingling Psychiatric: No significant anxiety , depression, insomnia, anorexia Endocrine: No change in hair/skin/ nails, excessive thirst, excessive hunger, excessive urination  Hematologic/lymphatic: No significant bruising, lymphadenopathy,abnormal bleeding Allergy/immunology: No itchy/ watery eyes, significant sneezing, urticaria, angioedema  Physical exam:  Pertinent or positive findings: General appearance:Adequately nourished; no acute distress , increased work of breathing is present.   Lymphatic: No lymphadenopathy about the head, neck, axilla . Eyes: No conjunctival  inflammation or lid edema is present. There is no scleral icterus. Ears:  External ear exam shows no significant lesions or deformities.   Nose:  External nasal examination shows no deformity or inflammation. Nasal mucosa are pink and moist without lesions ,exudates Oral exam: lips and gums are healthy appearing.There is no oropharyngeal erythema or exudate . Neck:  No thyromegaly, masses, tenderness noted.    Heart:  Normal rate and regular rhythm. S1 and S2 normal without gallop, murmur, click, rub .  Lungs:Chest clear to auscultation without wheezes, rhonchi,rales , rubs. Abdomen:Bowel sounds are normal. Abdomen is soft and nontender with no organomegaly, hernias,masses. GU: deferred  Extremities:  No cyanosis, clubbing,edema  Neurologic exam : Cn 2-7 intact Strength equal  in upper & lower extremities Balance,Rhomberg,finger to nose testing could not be completed due to clinical state Deep tendon reflexes are equal Skin: Warm & dry w/o tenting. No significant lesions or rash.  See summary under each active problem in the Problem List with associated updated therapeutic plan     This encounter was created in error - please disregard.

## 2016-09-19 ENCOUNTER — Other Ambulatory Visit: Payer: Self-pay | Admitting: *Deleted

## 2016-09-19 NOTE — Patient Outreach (Signed)
[facility]) Care Management  09/19/2016  ARLETA OSTRUM [birth year] [id number]   Call received from patient nephew, Elmer Ramp.  RNCM had spoken with Nephew when patient first went to [facility] [place]. Patient has since remained LTC, private pay.  Nephew asking questions around resources to take patient home.  He states he has met with facility but they are giving pushback to nephew.   Plan to check to see if patient is on All Payer List to verify current eligibility.  Will call Nephew back regarding eligibility.   Royetta Crochet. Laymond Purser, RN, BSN, [place] [phone]) Business Cell  [phone]) Toll Free Office

## 2016-09-20 ENCOUNTER — Other Ambulatory Visit: Payer: Self-pay | Admitting: *Deleted

## 2016-09-20 NOTE — Patient Outreach (Signed)
Call back to patient nephew, Lorin PicketScott re: [facility]. RNCM checked patient eligibility and [facility] is not [facility] services at this time.   Plan to sign off. Alben SpittleMary E. Albertha GheeNiemczura, RN, BSN, CCM  Post Acute Chartered loss adjusterCare Coordinator [facility] [phone]) Business Cell  [phone]) Toll Free Office

## 2016-10-04 ENCOUNTER — Non-Acute Institutional Stay (SKILLED_NURSING_FACILITY): Payer: Medicare Other | Admitting: Internal Medicine

## 2016-10-04 ENCOUNTER — Encounter: Payer: Self-pay | Admitting: Internal Medicine

## 2016-10-04 DIAGNOSIS — I10 Essential (primary) hypertension: Secondary | ICD-10-CM

## 2016-10-04 DIAGNOSIS — Z794 Long term (current) use of insulin: Secondary | ICD-10-CM | POA: Diagnosis not present

## 2016-10-04 DIAGNOSIS — F0150 Vascular dementia without behavioral disturbance: Secondary | ICD-10-CM | POA: Diagnosis not present

## 2016-10-04 DIAGNOSIS — E118 Type 2 diabetes mellitus with unspecified complications: Secondary | ICD-10-CM

## 2016-10-04 NOTE — Assessment & Plan Note (Signed)
Repeat A1c and renal function

## 2016-10-04 NOTE — Progress Notes (Signed)
    NURSING HOME LOCATION:  [facility] ROOM NUMBER:  102-A  CODE STATUS:  Full Code  PCP:  Etta GrandchildJones, Thomas L, MD  [address] [place] [id number]  This is a nursing facility follow up of chronic medical diagnoses  Interim medical record and care [facility] was updated with review of diagnostic studies and change in clinical status since last visit were documented.  HPI: [facility] is following the patient. They've informed me that apparently her nephew Lorin PicketScott has obtained healthcare power of attorney. He has been discussing possibility of taking her home. The patient remains on Aricept and Effexor as recommended by the Psych NP. She has diagnoses of depression and vascular dementia. There's been no reported change in her dementia and mental status. She has a normochromic, normocytic anemia which is been essentially stable with minimal variation. Most recent hemoglobin 10.4/hematocrit 31. Lipids were excellent except for HDL of 37 in February of this year. Glucoses have ranged from 136-261. Diabetic control is adequate based on A1c of 7.9% on 06/08/16. She is on low-dose basal insulin. Systolic blood pressure was slightly elevated today, she is on a calcium channel blocker through the feeding tube every 8 hours. Blood pressure diary review indicates blood pressure has varied from 109/67-124/94 until this isolated elevation. She remains on Eliquis prophylactically for DVT. She has no bleeding dyscrasias reported.  Review of systems: The patient is nonverbal with no meaningful interaction with examiner.  Physical exam:  Pertinent or positive findings: Her eyes are intermittently open with alternating lateral deviation intermittently. She does not focus. She was unresponsive to simple commands. She has torticollis with the head and neck deviated to the left. She resisted exam of the oral cavity. Heart rhythm is regular  & slow ;breath sounds are decreased  at the bases. PEG tube is present. Pedal pulses are surprisingly good. Her limbs are flaccid except for slower drop to the bed of the left upper extremity when lifted. Her wrist are in braces.   General appearance:Adequately nourished; no acute distress , increased work of breathing is present.   Lymphatic: No lymphadenopathy about the head, neck, axilla . Eyes: No conjunctival inflammation or lid edema is present. There is no scleral icterus. Ears:  External ear exam shows no significant lesions or deformities.   Nose:  External nasal examination shows no deformity or inflammation. Nasal mucosa are pink and moist without lesions ,exudates Oral exam: lips and gums are healthy appearing. Neck:  No thyromegaly, masses, tenderness noted.    Heart:  No gallop, murmur, click, rub .  Lungs:without wheezes, rhonchi,rales , rubs. Abdomen:Bowel sounds are normal. Abdomen is soft and nontender with no organomegaly, hernias,masses. GU: deferred  Extremities:  No cyanosis, clubbing,edema  Skin: Warm & dry w/o tenting. No significant lesions or rash.  See summary under each active problem in the Problem List with associated updated therapeutic plan

## 2016-10-04 NOTE — Assessment & Plan Note (Signed)
Isolated systolic elevation appears to be an aberration, but blood pressure monitor will continue. Low-dose calcium channel blocker will be increased if sustained hypertension is documented.

## 2016-10-04 NOTE — Assessment & Plan Note (Signed)
10/04/16 no change in her mental status is clinically expected Nephew, HCPOA would need 24/7 support including tube feeding for home care

## 2016-10-04 NOTE — Patient Instructions (Signed)
See assessment and plan under each diagnosis in the problem list and acutely for this visit 

## 2016-10-12 LAB — BASIC METABOLIC PANEL
BUN: 153 — AB (ref 4–21)
Creatinine: 2.3 — AB (ref 0.5–1.1)
Glucose: 168
Potassium: 3.2 — AB (ref 3.4–5.3)
Sodium: 162 — AB (ref 137–147)

## 2016-10-12 LAB — CBC AND DIFFERENTIAL
HEMATOCRIT: 40 (ref 36–46)
Hemoglobin: 12.7 (ref 12.0–16.0)
NEUTROS ABS: 10
PLATELETS: 198 (ref 150–399)
WBC: 15.6

## 2016-10-13 LAB — BASIC METABOLIC PANEL
BUN: 161 — AB (ref 4–21)
CREATININE: 2 — AB (ref 0.5–1.1)
GLUCOSE: 260
Potassium: 3.8 (ref 3.4–5.3)
Sodium: 160 — AB (ref 137–147)

## 2016-10-13 LAB — CBC AND DIFFERENTIAL
HCT: 45 (ref 36–46)
Hemoglobin: 14.1 (ref 12.0–16.0)
NEUTROS ABS: 10
Platelets: 244 (ref 150–399)
WBC: 13.1

## 2016-10-15 ENCOUNTER — Encounter: Payer: Self-pay | Admitting: Internal Medicine

## 2016-10-15 ENCOUNTER — Non-Acute Institutional Stay (SKILLED_NURSING_FACILITY): Payer: Medicare Other | Admitting: Internal Medicine

## 2016-10-15 DIAGNOSIS — E870 Hyperosmolality and hypernatremia: Secondary | ICD-10-CM | POA: Diagnosis not present

## 2016-10-15 DIAGNOSIS — J189 Pneumonia, unspecified organism: Secondary | ICD-10-CM | POA: Diagnosis not present

## 2016-10-15 DIAGNOSIS — R063 Periodic breathing: Secondary | ICD-10-CM | POA: Diagnosis not present

## 2016-10-15 LAB — BASIC METABOLIC PANEL
BUN: 118 — AB (ref 4–21)
CREATININE: 1.8 — AB (ref 0.5–1.1)
GLUCOSE: 240
POTASSIUM: 3.5 (ref 3.4–5.3)
Sodium: 166 — AB (ref 137–147)

## 2016-10-15 LAB — CBC AND DIFFERENTIAL
HCT: 35 — AB (ref 36–46)
HEMOGLOBIN: 11 — AB (ref 12.0–16.0)
Neutrophils Absolute: 8
PLATELETS: 233 (ref 150–399)
WBC: 12.6

## 2016-10-15 NOTE — Progress Notes (Signed)
    NURSING HOME LOCATION:  [facility] ROOM NUMBER:  102-A  CODE STATUS:  Full Code  PCP:  Etta GrandchildJones, Thomas L, MD  [address] [place] [id number]  This is a nursing facility follow up for specific acute issue of possible aspiration pneumonia complicated by profound azotemia, hypernatremia, and hypercalcemia.  Interim medical record and care since last [facility] visit was updated with review of diagnostic studies and change in clinical status since last visit were documented.  HPI: [employer] NP initiated Rocephin for clinical aspiration pneumonia 10/12/16. Calcium was found to be 14.5, creatinine 2.30, BUN 153, and sodium 162. She received parenteral fluids with repeat of labs 6/30. Calcium was essentially unchanged at 14.4, creatinine improved to 2.04. Her azotemia actually worsened to 161. There was minimal improvement in sodium to 160. The patient had been hospitalized 2/11-2/16/18 for left lower lobe pneumonia associated with hypernatremia. At that time sodium was 165 which was attributed to hypovolemia. Glucose was 366. Creatinine rose to 1.39 BUN 47. 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  Lovenox was started at that time because of DVT in the femoral profunda, popliteal and posterior popliteal conditions. She had a previous history of DVT in the right lower extremity which was not treated due to a GI bleed at that time. At the SNF the Lovenox was changed to Eliquis. Significant history also includes aspiration pneumonia in October 2017 .She had bilateral pneumonia at that time superimposed on COPD and bronchiectasis. She had MRSA which was PCR negative.   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  Physical exam:  Pertinent or positive findings: Malodorous character suggested.to general environment. The patient is totally unresponsive. She  exhibits torticollis with the head and neck turned to the left. Eyes are open and she has alternating exotropia suggesting slow nystagmus. Teeth and tongue are coated. She exhibits Cheyne-Stokes respirations with intermittent apnea. With tachypnea she will exhibit rales on the left greater than right. She exhibits intermittent gallop rhythm. Limbs are flaccid. Over the right forearm there are 4 horizontal lines of faint hyperpigmentation.  General appearance:Adequately nourished   Lymphatic: No lymphadenopathy about the head, neck, axilla . Eyes: No conjunctival inflammation or lid edema is present. There is no scleral icterus. Ears:  External ear exam shows no significant lesions or deformities.   Nose:  External nasal examination shows no deformity or inflammation. Nasal mucosa are pink and moist without lesions ,exudates Neck:  No masses, tenderness noted.    Abdomen:PEG present.Bowel sounds are normal. Abdomen is soft and nontender with no organomegaly, hernias,masses. GU: deferred  Extremities:  No cyanosis, clubbing,edema  Skin: Warm  w/o tenting. No significant lesions or rash.  #1 probable aspiration pneumonia  #2 profound hypernatremia #3 profound hypercalcemia, rule out hyperparathyroidism #4 Cheyne-Stokes respirations, rule out unilateral hemispheric infarct Plan: Felton ClintonEileen Weston NP and I reviewed her case in detail. She has had multiple discussions by phone with [facility] who has elected DO NOT RESUSCITATE and Hospice care. This is clinically the most appropriate and humane course for this unfortunate person

## 2016-10-15 NOTE — Patient Instructions (Signed)
HCPOA has requested DNR rather than hospitalization. Hospice will be consulted.

## 2016-10-26 ENCOUNTER — Non-Acute Institutional Stay (SKILLED_NURSING_FACILITY): Payer: Medicare Other | Admitting: Adult Health

## 2016-10-26 ENCOUNTER — Encounter: Payer: Self-pay | Admitting: Adult Health

## 2016-10-26 DIAGNOSIS — E870 Hyperosmolality and hypernatremia: Secondary | ICD-10-CM | POA: Diagnosis not present

## 2016-10-26 DIAGNOSIS — J42 Unspecified chronic bronchitis: Secondary | ICD-10-CM

## 2016-10-26 DIAGNOSIS — E118 Type 2 diabetes mellitus with unspecified complications: Secondary | ICD-10-CM | POA: Diagnosis not present

## 2016-10-26 DIAGNOSIS — F339 Major depressive disorder, recurrent, unspecified: Secondary | ICD-10-CM

## 2016-10-26 DIAGNOSIS — N183 Chronic kidney disease, stage 3 unspecified: Secondary | ICD-10-CM

## 2016-10-26 DIAGNOSIS — F0390 Unspecified dementia without behavioral disturbance: Secondary | ICD-10-CM

## 2016-10-26 DIAGNOSIS — I10 Essential (primary) hypertension: Secondary | ICD-10-CM | POA: Diagnosis not present

## 2016-10-26 DIAGNOSIS — G8929 Other chronic pain: Secondary | ICD-10-CM | POA: Diagnosis not present

## 2016-10-26 DIAGNOSIS — J189 Pneumonia, unspecified organism: Secondary | ICD-10-CM | POA: Diagnosis not present

## 2016-10-26 DIAGNOSIS — R1310 Dysphagia, unspecified: Secondary | ICD-10-CM

## 2016-10-26 NOTE — Progress Notes (Signed)
DATE:  10/26/2016   MRN:  [id number]  BIRTHDAY: January 08, 1941  Facility:  Nursing Home Location:  [facility] Room Number: 102-A  LEVEL OF CARE:  SNF (31)  Contact Information    Name Relation Home Work Mobile   [name],John Brother [phone]     ?,Heather RobertsScott Nephew   [phone]       Code Status History    Date Active Date Inactive Code Status Order ID Comments User Context   05/28/2016  3:39 AM 06/01/2016  5:49 PM Full Code [id number]  Bobette Mortiz, David Manuel, MD ED   03/14/2016  2:37 PM 03/21/2016  8:11 PM Full Code [id number]  Stephani PoliceYork, Marianne L, PA-C Inpatient   03/05/2016  1:18 PM 03/14/2016  2:37 PM Full Code [id number]  Tobey GrimEubanks, Katalina M, NP ED   02/05/2016 11:22 PM 02/09/2016  4:58 PM Full Code [id number]  Bobette Mortiz, David Manuel, MD Inpatient   06/21/2013 12:33 AM 06/23/2013  9:49 PM Full Code [id number]  Ron ParkerJenkins, Harvette C, MD Inpatient   06/04/2013  3:03 AM 06/08/2013  2:26 PM Full Code [id number]  Haydee Monicaavid, Rachal A, MD Inpatient   12/09/2012  6:55 PM 12/14/2012 12:54 PM Full Code [id number]  Christiane HaSullivan, Corinna L, MD Inpatient    Advance Directive Documentation     Most Recent Value  Type of Advance Directive  Out of facility DNR (pink MOST or yellow form)  Pre-existing out of facility DNR order (yellow form or pink MOST form)  -  "MOST" Form in Place?  -       Chief Complaint  Patient presents with  . Medical Management of Chronic Issues    Routine visit    HISTORY OF PRESENT ILLNESS:  This is a 76-YO female seen for a routine visit.  She is a long-term care resident of [facility]. She is also under the care of [facility]. She was seen in the room today. She is non-verbal, opens her eyes to verbal stimulation and noted that she is able to move her LUE. Skin is warm to touch but according to Amy, hospice nurse, POA does not want any aggressive treatment, no more labs nor chest x-ray and wants the patient to be comfortable. She had a recent HCAP  and family requested for her feedings to be reduced. Diabetic source was reduced from 45 to 35 ml/hour. She has PMH of osteoarthritis, chronic lower back pain, COPD, type II day pedis, GERD, COPD, hyperlipidemia, hypertension, gout, migraine headaches, and multiple episodes of pneumonia.    PAST MEDICAL HISTORY:  Past Medical History:  Diagnosis Date  . Altered mental status 12/09/2012  . Arthritis    "all over" (12/09/2012)  . Chronic lower back pain   . COPD (chronic obstructive pulmonary disease) (HCC)   . Diabetes mellitus without complication (HCC)   . Exertional shortness of breath   . GERD (gastroesophageal reflux disease)    rolaids if needed  . Gout, unspecified   . Hepatitis, unspecified    "the one that's not bad" (12/09/2012)  . History of blood transfusion    "related to a surgery, I think" (12/09/2012)  . Hyperlipidemia   . Hypertension   . Migraines    "in the past" (12/09/2012)  . Osteoarthrosis, unspecified whether generalized or localized, unspecified site   . Other chronic sinusitis   . Pneumonia    "couple times; long time ago" (12/09/2012)  . Unspecified chronic bronchitis (HCC)    sees Dr. Melba Coon. Young, last visit  08/2011, treated for E. Coli- resp. /w Ceftin, Feb. 2013     CURRENT MEDICATIONS: Reviewed  Patient's Medications  New Prescriptions   No medications on file  Previous Medications   ACETAMINOPHEN (TYLENOL) 325 MG SUPPOSITORY    Place 325 mg rectally every 4 (four) hours as needed for fever (For temperature >100).    ACETAMINOPHEN (TYLENOL) 325 MG TABLET    Place 650 mg into feeding tube 2 (two) times daily.   AMINO ACIDS-PROTEIN HYDROLYS (FEEDING SUPPLEMENT, PRO-STAT SUGAR FREE 64,) LIQD    Place 30 mLs into feeding tube daily.   ASPIRIN 81 MG CHEWABLE TABLET    Place 81 mg into feeding tube daily.   BISACODYL (DULCOLAX) 10 MG SUPPOSITORY    Place 10 mg rectally as needed for moderate constipation.   INSULIN ASPART (NOVOLOG) 100 UNIT/ML INJECTION     Inject 10 Units into the skin daily.    INSULIN GLARGINE (LANTUS) 100 UNIT/ML INJECTION    Inject 10 Units into the skin at bedtime.   IPRATROPIUM-ALBUTEROL (DUONEB) 0.5-2.5 (3) MG/3ML SOLN    Take 3 mLs by nebulization 3 (three) times daily as needed (for shortness of breath).    LORAZEPAM (ATIVAN) 2 MG/ML CONCENTRATED SOLUTION    Place 0.5 mg under the tongue every 2 (two) hours as needed.   MAGNESIUM HYDROXIDE (MILK OF MAGNESIA) 400 MG/5ML SUSPENSION    Take 30 mLs by mouth daily as needed for mild constipation.   MORPHINE (ROXANOL) 20 MG/ML CONCENTRATED SOLUTION    Take 5 mg by mouth every 4 (four) hours as needed for severe pain or shortness of breath.   NUTRITIONAL SUPPLEMENTS (FEEDING SUPPLEMENT, GLUCERNA 1.2 CAL,) LIQD    Place 1,000 mLs into feeding tube continuous. 35 mL/hour   SKIN PROTECTANTS, MISC. (CARRINGTON MOIST BARRIER/ZINC EX)    Apply topically 3 (three) times daily. Apply to inner thighs each shift   SODIUM PHOSPHATES (RA SALINE ENEMA) 19-7 GM/118ML ENEM    Place 1 each rectally as needed (for constipation).   VENLAFAXINE (EFFEXOR) 37.5 MG TABLET    Place 1 tablet (37.5 mg total) into feeding tube See admin instructions. Takes 1 capsule daily, then can take 1 addt'l cap as needed for hot flashes   VERAPAMIL (CALAN) 40 MG TABLET    Place 40 mg into feeding tube every 8 (eight) hours.   Modified Medications   No medications on file  Discontinued Medications   APIXABAN (ELIQUIS) 5 MG TABS TABLET    Place 5 mg into feeding tube 2 (two) times daily.   TIOTROPIUM BROMIDE-OLODATEROL (STIOLTO RESPIMAT) 2.5-2.5 MCG/ACT AERS    Inhale 2 puffs into the lungs daily.   WATER FOR IRRIGATION, STERILE (FREE WATER) SOLN    Place 250 mLs into feeding tube every 6 (six) hours.     Allergies  Allergen Reactions  . Codeine Rash and Other (See Comments)     angioedema  . Neomycin Itching  . Penicillins Itching and Rash       . Tetanus Toxoids Rash  . Diphtheria Toxoid-Containing Vaccines  Other (See Comments)  . Levofloxacin Rash     REVIEW OF SYSTEMS:  Unable to obtain due to advanced dementia   PHYSICAL EXAMINATION  GENERAL APPEARANCE:  In no acute distress.  SKIN:  Has open wound on left ear with dressing  HEAD: Normal in size and contour. No evidence of trauma MOUTH and THROAT: Lips are without lesions.  RESPIRATORY: breathing is even & unlabored, BS CTAB CARDIAC: RRR, no murmur,no  extra heart sounds, no edema GI: abdomen soft, normal BS, no masses, has PEG tube on LUQ EXTREMITIES:  Able to move LUE    LABS/RADIOLOGY: Labs reviewed: Basic Metabolic Panel:  Recent Labs  81/19/14 0619 03/14/16 0634 03/15/16 0416  03/18/16 0805  03/20/16 0516  05/30/16 0911 05/31/16 0222 06/01/16 0540  10/12/16 10/13/16 10/15/16  NA 138 136 138  < > 140  < > 138  < > 141 141 140  < > 162* 160* 166*  K 3.4* 5.2* 3.4*  < > 3.3*  < > 3.4*  < > 3.3* 3.3* 3.6  < > 3.2* 3.8 3.5  CL 100* 103 105  < > 108  < > 108  < > 113* 112* 109  --   --   --   --   CO2 25 22 23   < > 20*  < > 21*  < > 22 21* 23  --   --   --   --   GLUCOSE 137* 125* 93  < > 99  < > 122*  < > 165* 155* 136*  --   --   --   --   BUN 9 9 8   < > <5*  < > <5*  < > 30* 26* 21*  < > 153* 161* 118*  CREATININE 0.77 1.03* 0.97  < > 0.90  < > 1.02*  < > 0.81 0.74 0.76  < > 2.3* 2.0* 1.8*  CALCIUM 10.3 9.7 9.9  < > 10.6*  < > 10.3  < > 9.7 10.0 10.2  --   --   --   --   MG 1.9 1.7 1.6*  < > 1.7  --  1.4*  --   --   --  1.9  --   --   --   --   PHOS 3.4 3.3 3.6  --   --   --   --   --   --   --   --   --   --   --   --   < > = values in this interval not displayed. Liver Function Tests:  Recent Labs  03/05/16 1133 05/27/16 2212 05/28/16 0450  AST 21 48* 43*  ALT 21 69* 54  ALKPHOS 100 142* 114  BILITOT 0.4 0.4 1.1  PROT 6.0* 7.9 6.9  ALBUMIN 1.9* 2.6* 2.3*    Recent Labs  05/29/16 1126  AMMONIA 30   CBC:  Recent Labs  05/30/16 0233 05/31/16 0222 06/01/16 0540  10/12/16 10/13/16 10/15/16  WBC  8.8 10.2 9.4  < > 15.6 13.1 12.6  NEUTROABS 5.4  --   --   < > 10 10 8   HGB 9.7* 10.0* 9.2*  < > 12.7 14.1 11.0*  HCT 31.1* 31.0* 29.6*  < > 40 45 35*  MCV 92.0 89.9 91.1  --   --   --   --   PLT 131* 152 162  < > 198 244 233  < > = values in this interval not displayed. Lipid Panel:  Recent Labs  06/08/16  HDL 37   Cardiac Enzymes:  Recent Labs  03/05/16 2002 03/06/16 0105 03/06/16 0702  TROPONINI 0.03* 0.05* 0.04*   CBG:  Recent Labs  06/01/16 0544 06/01/16 0755 06/01/16 1138  GLUCAP 136* 131* 142*      ASSESSMENT/PLAN:  1. Dementia without behavioral disturbance, unspecified dementia type - advanced, currently on hospice care, continue supportive care, fall precautions  2. Type 2 diabetes mellitus with complication, without long-term current use of insulin (HCC) - continue Lantus 100 units/mL inject 10 units subcutaneous daily at bedtime, and NovoLog 100 units/mL inject 10 units subcutaneous daily Lab Results  Component Value Date   HGBA1C 7.9 06/08/2016    3. Other chronic pain - for comfort care, continue morphine 20 mg/ML give 5 mg/0.25 mL every 4 hours when necessary and acetaminophen 325 mg give 2 tabs = 650 mg BID   4. Essential hypertension - BP 104/66, will decrease Verapamil from 40 mg to 1/2 tab = 20 mg Q 8 hours and to hold for SBP,110   5. Depression, recurrent (HCC) - continue Venlafaxine 37.5 mg 1 tab Q D   6. Dysphagia, unspecified type - recently decreased Diabetic Source from 45 ml to 35 ml/hour, aspiration precaution  7. Hypernatremia - Na 166, family does not want aggressive measures and wants comfort care   8. HCAP (healthcare-associated pneumonia)  - family does not want aggressive measures and currently on comfort/hospice care  9. Chronic kidney disease, stage III (moderate) - GFR  31.36, currently on comfort care Lab Results  Component Value Date   CREATININE 1.8 (A) 10/15/2016   10. COPD - no SOB at present, continue  Ipratropium-albuterol via nebulizer Q 6 hours PRN     Goals of care:  Long-term care/Hospice care    Kimari Coudriet C. Medina-Vargas - NP    [employer] [phone]

## 2016-10-29 ENCOUNTER — Non-Acute Institutional Stay (SKILLED_NURSING_FACILITY): Payer: Medicare Other | Admitting: Adult Health

## 2016-10-29 ENCOUNTER — Encounter: Payer: Self-pay | Admitting: Adult Health

## 2016-10-29 DIAGNOSIS — J189 Pneumonia, unspecified organism: Secondary | ICD-10-CM | POA: Diagnosis not present

## 2016-10-29 DIAGNOSIS — E870 Hyperosmolality and hypernatremia: Secondary | ICD-10-CM

## 2016-10-29 DIAGNOSIS — F0390 Unspecified dementia without behavioral disturbance: Secondary | ICD-10-CM

## 2016-10-29 DIAGNOSIS — E876 Hypokalemia: Secondary | ICD-10-CM | POA: Diagnosis not present

## 2016-10-29 LAB — CBC AND DIFFERENTIAL
HEMATOCRIT: 37 (ref 36–46)
HEMOGLOBIN: 11.9 — AB (ref 12.0–16.0)
NEUTROS ABS: 10
PLATELETS: 159 (ref 150–399)
WBC: 14.4

## 2016-10-29 LAB — BASIC METABOLIC PANEL
BUN: 91 — AB (ref 4–21)
CREATININE: 1.9 — AB (ref 0.5–1.1)
Glucose: 207
Potassium: 2.9 — AB (ref 3.4–5.3)
Sodium: 175 — AB (ref 137–147)

## 2016-10-29 NOTE — Progress Notes (Signed)
DATE:  10/29/2016   MRN:  [id number]  BIRTHDAY: [birth year]  Facility:  Nursing Home Location:  [facility] Room Number: 326-B  LEVEL OF CARE:  SNF (31)  Contact Information    Name Relation Home Work Mobile   [name],John Brother [phone]     ?,Heather Roberts   [phone]       Code Status History    Date Active Date Inactive Code Status Order ID Comments User Context   05/28/2016  3:39 AM 06/01/2016  5:49 PM Full Code [id number]  Bobette Mo, MD ED   03/14/2016  2:37 PM 03/21/2016  8:11 PM Full Code [id number]  Stephani Police, PA-C Inpatient   03/05/2016  1:18 PM 03/14/2016  2:37 PM Full Code [id number]  Tobey Grim, NP ED   02/05/2016 11:22 PM 02/09/2016  4:58 PM Full Code [id number]  Bobette Mo, MD Inpatient   06/21/2013 12:33 AM 06/23/2013  9:49 PM Full Code [id number]  Ron Parker, MD Inpatient   06/04/2013  3:03 AM 06/08/2013  2:26 PM Full Code [id number]  Haydee Monica, MD Inpatient   12/09/2012  6:55 PM 12/14/2012 12:54 PM Full Code [id number]  Christiane Ha, MD Inpatient    Advance Directive Documentation     Most Recent Value  Type of Advance Directive  Out of facility DNR (pink MOST or yellow form)  Pre-existing out of facility DNR order (yellow form or pink MOST form)  -  "MOST" Form in Place?  -       Chief Complaint  Patient presents with  . Acute Visit    Fever    HISTORY OF PRESENT ILLNESS:  This is a 76-YO female seen for an acute visit. She had fever of 101.4  .  Tylenol suppository was given and fever has gone down. She doesn't have fever @ this time. Latest temp 98.9. She was seen in her room. No movement noted. Noted right eyelid to slightly blinked to verbal stimulation. Talked to responsible party [name] over the phone, and updated regarding patient's condition. She is currently on hospice care but nephew requested for patient to have labs and CXR done. Chest x-ray showed dense  bilateral pulmonary infiltrates, wbc 14.4, K 2.94, Na 175 creatinine 1.94 and GFR 28.63. Called nephew on the phone again and updated him regarding results. He decided to keep patient comfortable and for no treatment. She has PMH of osteoarthritis, chronic lower back pain, COPD, type II day pedis, GERD, COPD, hyperlipidemia, hypertension, gout, migraine headaches, and multiple episodes of pneumonia.    PAST MEDICAL HISTORY:  Past Medical History:  Diagnosis Date  . Altered mental status 12/09/2012  . Arthritis    "all over" (12/09/2012)  . Chronic lower back pain   . COPD (chronic obstructive pulmonary disease) (HCC)   . Diabetes mellitus without complication (HCC)   . Exertional shortness of breath   . GERD (gastroesophageal reflux disease)    rolaids if needed  . Gout, unspecified   . Hepatitis, unspecified    "the one that's not bad" (12/09/2012)  . History of blood transfusion    "related to a surgery, I think" (12/09/2012)  . Hyperlipidemia   . Hypertension   . Migraines    "in the past" (12/09/2012)  . Osteoarthrosis, unspecified whether generalized or localized, unspecified site   . Other chronic sinusitis   . Pneumonia    "couple times; long time ago" (12/09/2012)  . Unspecified chronic  bronchitis (HCC)    sees Dr. Melba Coon. Young, last visit 08/2011, treated for E. Coli- resp. /w Ceftin, Feb. 2013     CURRENT MEDICATIONS: Reviewed  Patient's Medications  New Prescriptions   No medications on file  Previous Medications   ACETAMINOPHEN (TYLENOL) 325 MG SUPPOSITORY    Place 325 mg rectally every 4 (four) hours as needed for fever (For temperature >100).    ACETAMINOPHEN (TYLENOL) 325 MG TABLET    Place 650 mg into feeding tube 2 (two) times daily.   AMINO ACIDS-PROTEIN HYDROLYS (FEEDING SUPPLEMENT, PRO-STAT SUGAR FREE 64,) LIQD    Place 30 mLs into feeding tube daily.   ASPIRIN 81 MG CHEWABLE TABLET    Place 81 mg into feeding tube daily.   BISACODYL (DULCOLAX) 10 MG SUPPOSITORY     Place 10 mg rectally as needed for moderate constipation.   INSULIN ASPART (NOVOLOG) 100 UNIT/ML INJECTION    Inject 10 Units into the skin daily.    INSULIN GLARGINE (LANTUS) 100 UNIT/ML INJECTION    Inject 10 Units into the skin at bedtime.   IPRATROPIUM-ALBUTEROL (DUONEB) 0.5-2.5 (3) MG/3ML SOLN    Take 3 mLs by nebulization 3 (three) times daily as needed (for shortness of breath).    LORAZEPAM (ATIVAN) 2 MG/ML CONCENTRATED SOLUTION    Place 0.5 mg under the tongue every 2 (two) hours as needed.   MAGNESIUM HYDROXIDE (MILK OF MAGNESIA) 400 MG/5ML SUSPENSION    Take 30 mLs by mouth daily as needed for mild constipation.   MORPHINE (ROXANOL) 20 MG/ML CONCENTRATED SOLUTION    Take 5 mg by mouth every 4 (four) hours as needed for severe pain or shortness of breath.   NUTRITIONAL SUPPLEMENTS (FEEDING SUPPLEMENT, GLUCERNA 1.2 CAL,) LIQD    Place 1,000 mLs into feeding tube continuous. 35 mL/hour   SKIN PROTECTANTS, MISC. (CARRINGTON MOIST BARRIER/ZINC EX)    Apply topically 3 (three) times daily. Apply to inner thighs each shift   SODIUM PHOSPHATES (RA SALINE ENEMA) 19-7 GM/118ML ENEM    Place 1 each rectally as needed (for constipation).   VENLAFAXINE (EFFEXOR) 37.5 MG TABLET    Place 1 tablet (37.5 mg total) into feeding tube See admin instructions. Takes 1 capsule daily, then can take 1 addt'l cap as needed for hot flashes   VERAPAMIL (CALAN) 40 MG TABLET    Place 40 mg into feeding tube every 8 (eight) hours.   Modified Medications   No medications on file  Discontinued Medications   No medications on file     Allergies  Allergen Reactions  . Codeine Rash and Other (See Comments)     angioedema  . Neomycin Itching  . Penicillins Itching and Rash       . Tetanus Toxoids Rash  . Diphtheria Toxoid-Containing Vaccines Other (See Comments)  . Levofloxacin Rash     REVIEW OF SYSTEMS:  Unable to obtain due to advanced dementia   PHYSICAL EXAMINATION  GENERAL APPEARANCE:  In no acute  distress.  SKIN:  Has open wound on left ear with dressing  HEAD: Normal in size and contour. No evidence of trauma MOUTH and THROAT: Lips are without lesions.  RESPIRATORY: breathing is even & unlabored, rales on bilateral lung fields CARDIAC: RRR, no murmur,no extra heart sounds, no edema GI: abdomen soft, normal BS, no masses, has PEG tube on LUQ EXTREMITIES:  No movement has been noted with extremities    LABS/RADIOLOGY: Labs reviewed: 10/29/16   WBC 14.4 hemoglobin 11.9 MCV 94.5 platelet  159 neutrophils 9.9 lymphocytes 3.7 BUN 91.2 creatinine 1.94 sodium 175 CL 134 GFR28.63  K2.94 Basic Metabolic Panel:  Recent Labs  81/19/14 0619 03/14/16 0634 03/15/16 0416  03/18/16 0805  03/20/16 0516  05/30/16 0911 05/31/16 0222 06/01/16 0540  10/12/16 10/13/16 10/15/16  NA 138 136 138  < > 140  < > 138  < > 141 141 140  < > 162* 160* 166*  K 3.4* 5.2* 3.4*  < > 3.3*  < > 3.4*  < > 3.3* 3.3* 3.6  < > 3.2* 3.8 3.5  CL 100* 103 105  < > 108  < > 108  < > 113* 112* 109  --   --   --   --   CO2 25 22 23   < > 20*  < > 21*  < > 22 21* 23  --   --   --   --   GLUCOSE 137* 125* 93  < > 99  < > 122*  < > 165* 155* 136*  --   --   --   --   BUN 9 9 8   < > <5*  < > <5*  < > 30* 26* 21*  < > 153* 161* 118*  CREATININE 0.77 1.03* 0.97  < > 0.90  < > 1.02*  < > 0.81 0.74 0.76  < > 2.3* 2.0* 1.8*  CALCIUM 10.3 9.7 9.9  < > 10.6*  < > 10.3  < > 9.7 10.0 10.2  --   --   --   --   MG 1.9 1.7 1.6*  < > 1.7  --  1.4*  --   --   --  1.9  --   --   --   --   PHOS 3.4 3.3 3.6  --   --   --   --   --   --   --   --   --   --   --   --   < > = values in this interval not displayed. Liver Function Tests:  Recent Labs  03/05/16 1133 05/27/16 2212 05/28/16 0450  AST 21 48* 43*  ALT 21 69* 54  ALKPHOS 100 142* 114  BILITOT 0.4 0.4 1.1  PROT 6.0* 7.9 6.9  ALBUMIN 1.9* 2.6* 2.3*    Recent Labs  05/29/16 1126  AMMONIA 30   CBC:  Recent Labs  05/30/16 0233 05/31/16 0222 06/01/16 0540  10/12/16  10/13/16 10/15/16  WBC 8.8 10.2 9.4  < > 15.6 13.1 12.6  NEUTROABS 5.4  --   --   < > 10 10 8   HGB 9.7* 10.0* 9.2*  < > 12.7 14.1 11.0*  HCT 31.1* 31.0* 29.6*  < > 40 45 35*  MCV 92.0 89.9 91.1  --   --   --   --   PLT 131* 152 162  < > 198 244 233  < > = values in this interval not displayed. Lipid Panel:  Recent Labs  06/08/16  HDL 37   Cardiac Enzymes:  Recent Labs  03/05/16 2002 03/06/16 0105 03/06/16 0702  TROPONINI 0.03* 0.05* 0.04*   CBG:  Recent Labs  06/01/16 0544 06/01/16 0755 06/01/16 1138  GLUCAP 136* 131* 142*      ASSESSMENT/PLAN:  1. HCAP (healthcare-associated pneumonia) - chest x-ray showed dense bilaterahasl basilar pulmonary infiltrates, nephew, [name], has decided not to treat and decided comfort care  2. Hypokalemia - K 2.94, nephew  decided no treatment and to have comfort care  3. Hypernatremia - Na 175, nephew decided no treatment and to have comfort care  4. Dementia without behavioral disturbance, unspecified dementia type - continue supportive care, turn to sides Q 2 hours and keep skin clean and dry     Sheila Cisneros - NP    [employer] [phone]

## 2016-11-14 DEATH — deceased

## 2017-03-02 IMAGING — CT CT ABDOMEN W/O CM
2 of 4 series · 16 of 46 positions shown, 18 images · non-contrast
Comparison: 06/04/2013

CLINICAL DATA: Gastrostomy tube evaluation

EXAM:
CT ABDOMEN AND PELVIS WITHOUT CONTRAST
TECHNIQUE: Multidetector CT imaging of the abdomen and pelvis was performed
following the standard protocol without IV contrast.

[Series 2: abd/ pelvis 5.0 i30f 1 · axial · 0.59mm/px · z∈[+956,+1146]mm · 13 of 44 slices shown, 15 images]
[im 3/44  soft-tissue]
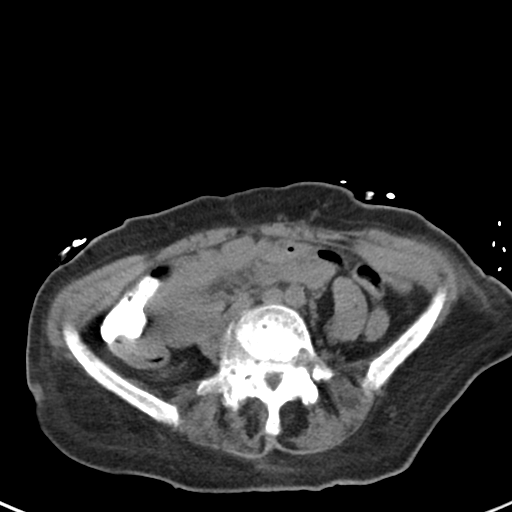
[im 3/44  bone]
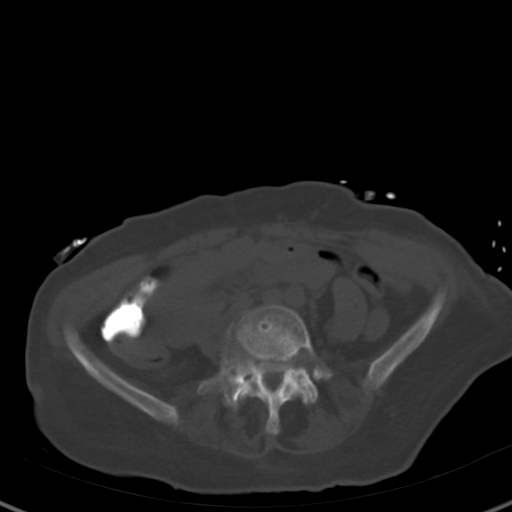
[im 7/44  soft-tissue]
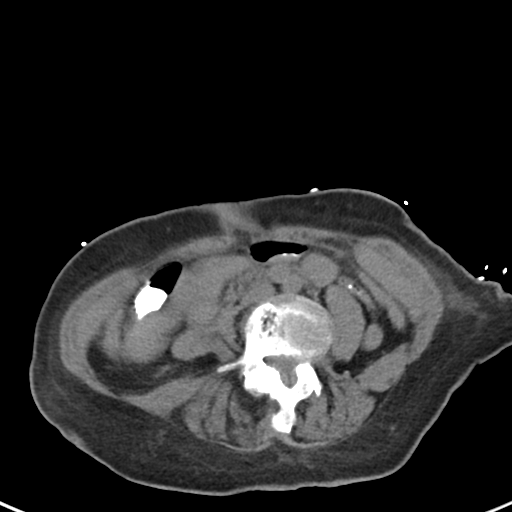
[im 9/44  soft-tissue]
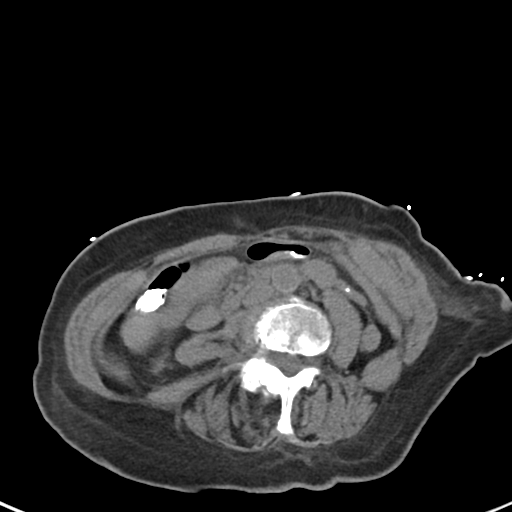
[im 13/44  soft-tissue]
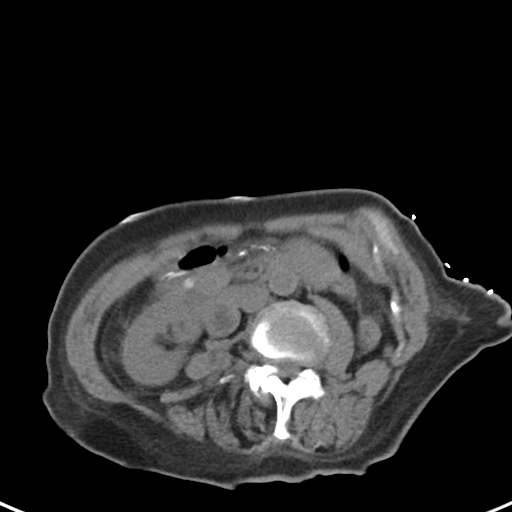
[im 16/44  soft-tissue]
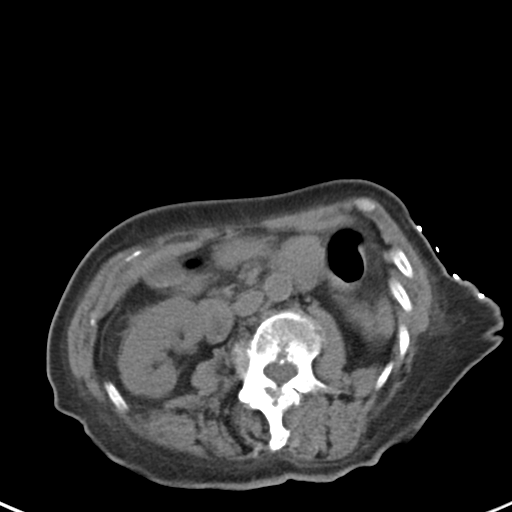
[im 20/44  soft-tissue]
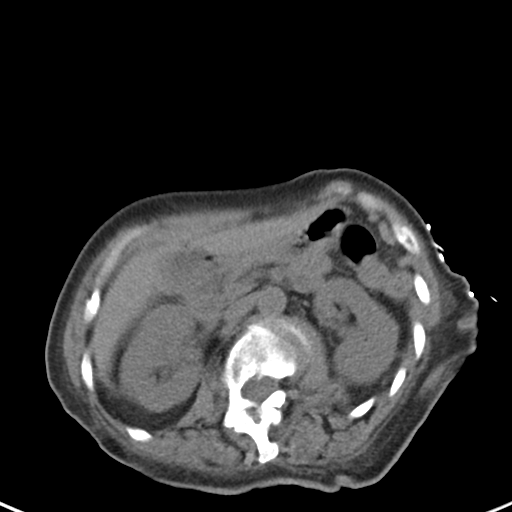
[im 22/44  soft-tissue]
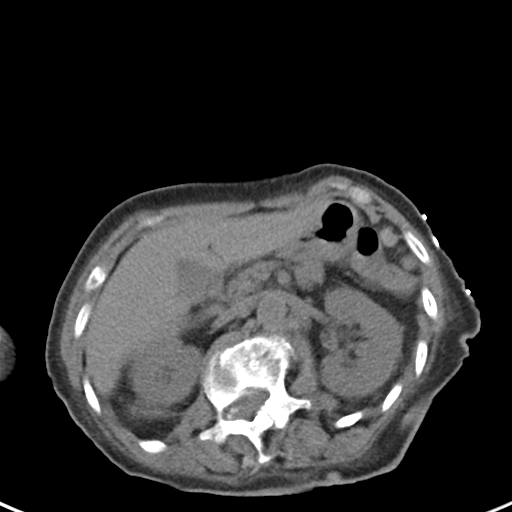
[im 24/44  soft-tissue]
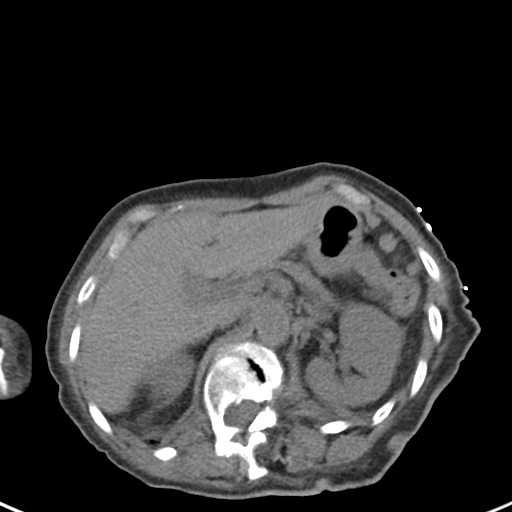
[im 28/44  soft-tissue]
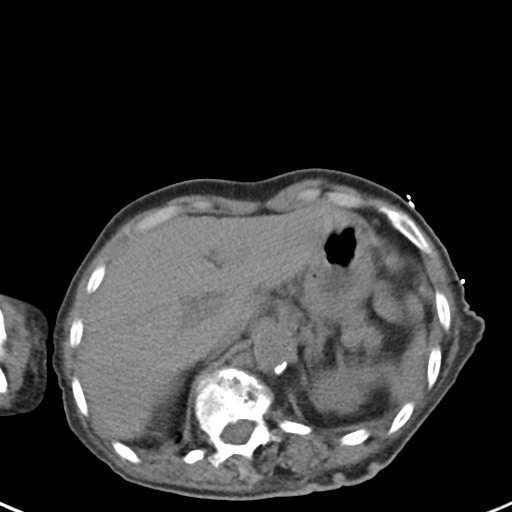
[im 28/44  bone]
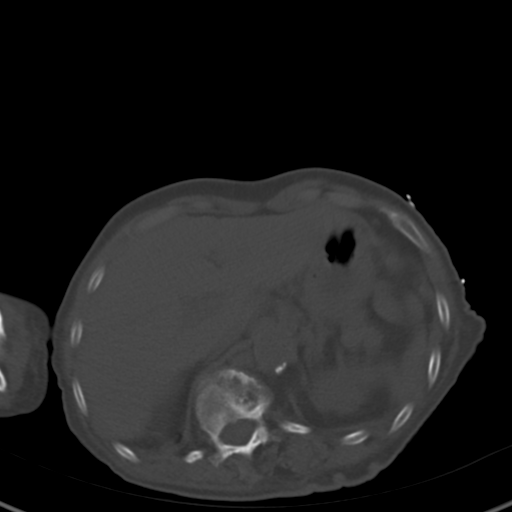
[im 31/44  soft-tissue]
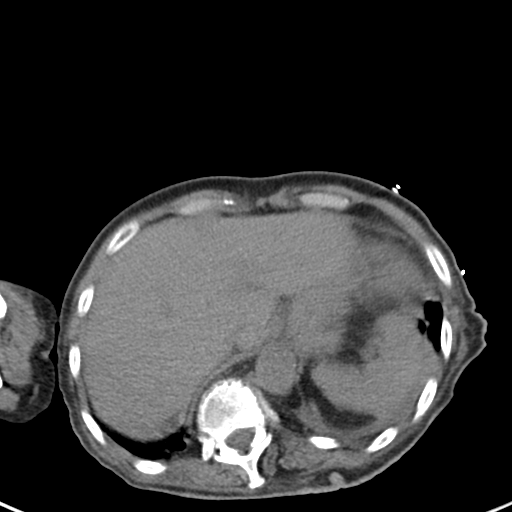
[im 35/44  soft-tissue]
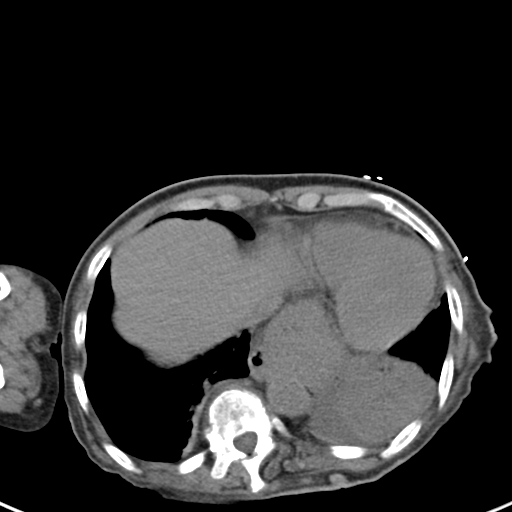
[im 37/44  soft-tissue]
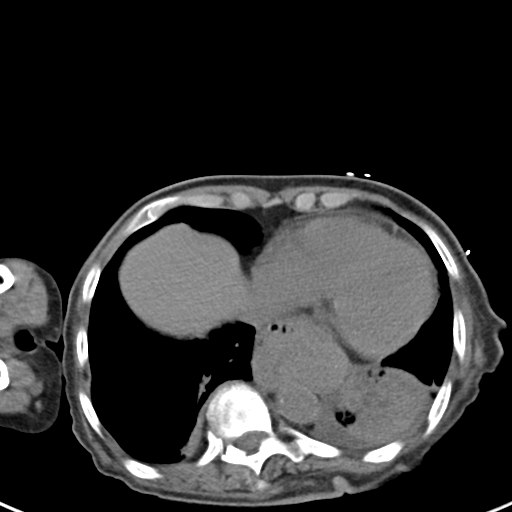
[im 41/44  soft-tissue]
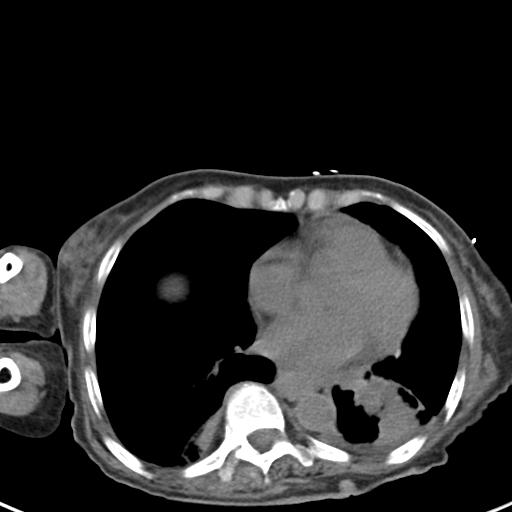

[Series 5: cor st · coronal · 0.43mm/px · 3 of 67 slices shown]
[im 23/67  soft-tissue]
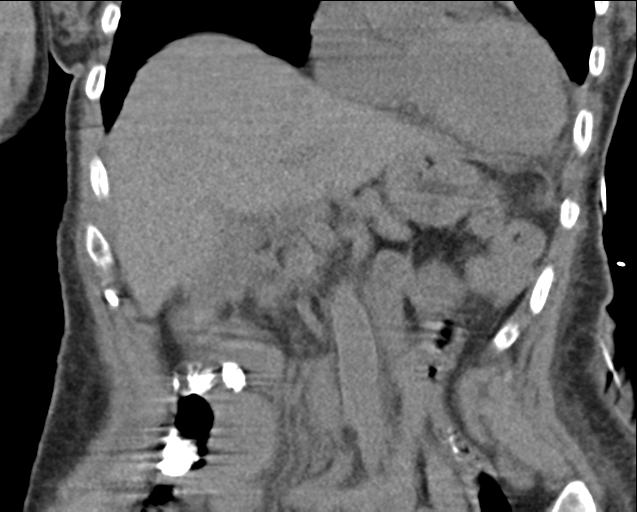
[im 30/67  soft-tissue]
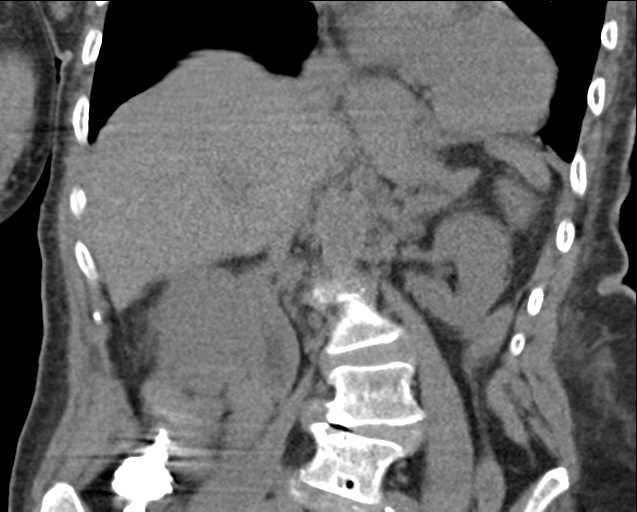
[im 37/67  soft-tissue]
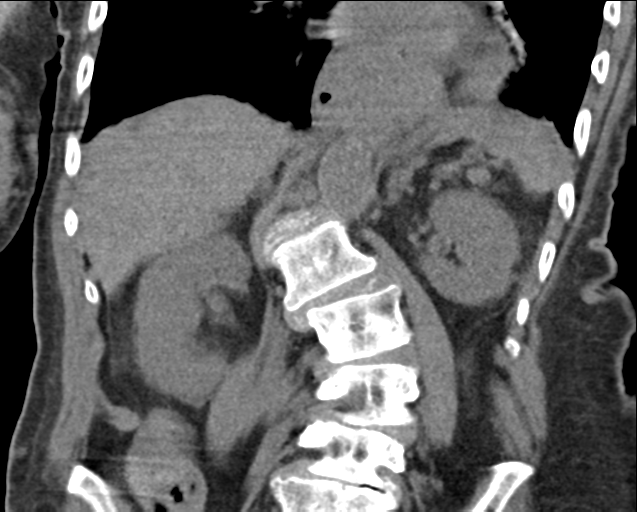

[16 of 46 positions shown; findings below may reference images not displayed]

FINDINGS: Lower chest: There is masslike consolidation at the left lung base.
There is an associated pleural effusion which appears loculated. The
masslike area in the left lower lobe measures 4.0 x 4.7 cm on image
6. It is partially imaged. Compare to a CT chest dated 08/18/2015,
previously, this abnormality at the appearance of acute
consolidation. On today's study, there are areas of low density
without air bronchograms. This is atypical with respect to an
inflammatory process in raises suspicion of other etiology suggest
malignancy. Subsegmental atelectasis at the right lung base.

Hepatobiliary: Stable small hypodensity in the right lobe of the
liver, measuring 1.4 cm. Gallbladder contains small stones.

Pancreas: Atrophic.

Spleen: Unremarkable.

Adrenals/Urinary Tract: No hydronephrosis or obvious mass. Cortical
calcification along the g of the right kidney is stable and
nonspecific. Adrenal glands are within normal limits.

Stomach/Bowel: The body of the stomach is positioned between the
left lobe of the liver and transverse colon. It is opposed to the
anterior abdominal wall.

Vascular/Lymphatic: No evidence of aortic aneurysm. No obvious
adenopathy.

Other: No free-fluid.

Musculoskeletal: Advanced levoscoliosis with the apex at L3-4. There
is severe degenerative disc disease at L3-4 and L4-5. No definite
acute compression deformity. Degenerative disc disease also occurs
in the lower thoracic spine.
IMPRESSION: There is favorable anatomy for gastrostomy tube placement as
described above

Masslike consolidation at the left lung base. This abnormality is
partially imaged and measures up to 4.7 cm. Differential diagnosis
includes an inflammatory process, round atelectasis, or malignancy.
Consider PET-CT for further characterization.

Cholelithiasis.
